# Patient Record
Sex: Female | Born: 1935 | Race: Black or African American | Hispanic: No | Smoking: Former smoker
Health system: Southern US, Community
[De-identification: ages and names within clinical notes are randomized; demographics above are authoritative.]

## PROBLEM LIST (undated history)

## (undated) DIAGNOSIS — M19111 Post-traumatic osteoarthritis, right shoulder: Secondary | ICD-10-CM

## (undated) DIAGNOSIS — I10 Essential (primary) hypertension: Secondary | ICD-10-CM

## (undated) DIAGNOSIS — M19112 Post-traumatic osteoarthritis, left shoulder: Secondary | ICD-10-CM

## (undated) DIAGNOSIS — E785 Hyperlipidemia, unspecified: Secondary | ICD-10-CM

## (undated) DIAGNOSIS — M792 Neuralgia and neuritis, unspecified: Secondary | ICD-10-CM

## (undated) DIAGNOSIS — K219 Gastro-esophageal reflux disease without esophagitis: Secondary | ICD-10-CM

## (undated) DIAGNOSIS — L8990 Pressure ulcer of unspecified site, unspecified stage: Secondary | ICD-10-CM

## (undated) DIAGNOSIS — Z8619 Personal history of other infectious and parasitic diseases: Secondary | ICD-10-CM

## (undated) DIAGNOSIS — M48061 Spinal stenosis, lumbar region without neurogenic claudication: Secondary | ICD-10-CM

## (undated) DIAGNOSIS — R51 Headache: Secondary | ICD-10-CM

## (undated) DIAGNOSIS — R519 Headache, unspecified: Secondary | ICD-10-CM

## (undated) DIAGNOSIS — Z96619 Presence of unspecified artificial shoulder joint: Secondary | ICD-10-CM

## (undated) DIAGNOSIS — M1990 Unspecified osteoarthritis, unspecified site: Secondary | ICD-10-CM

## (undated) DIAGNOSIS — S46002S Unspecified injury of muscle(s) and tendon(s) of the rotator cuff of left shoulder, sequela: Secondary | ICD-10-CM

## (undated) DIAGNOSIS — T7840XA Allergy, unspecified, initial encounter: Secondary | ICD-10-CM

## (undated) DIAGNOSIS — Z7409 Other reduced mobility: Secondary | ICD-10-CM

## (undated) DIAGNOSIS — H269 Unspecified cataract: Secondary | ICD-10-CM

## (undated) DIAGNOSIS — S46001S Unspecified injury of muscle(s) and tendon(s) of the rotator cuff of right shoulder, sequela: Secondary | ICD-10-CM

## (undated) HISTORY — DX: Spinal stenosis, lumbar region without neurogenic claudication: M48.061

## (undated) HISTORY — PX: TOTAL HIP ARTHROPLASTY: SHX124

## (undated) HISTORY — DX: Essential (primary) hypertension: I10

## (undated) HISTORY — PX: COLONOSCOPY: SHX174

## (undated) HISTORY — DX: Unspecified injury of muscle(s) and tendon(s) of the rotator cuff of left shoulder, sequela: S46.002S

## (undated) HISTORY — DX: Unspecified osteoarthritis, unspecified site: M19.90

## (undated) HISTORY — DX: Pressure ulcer of unspecified site, unspecified stage: L89.90

## (undated) HISTORY — DX: Post-traumatic osteoarthritis, right shoulder: M19.111

## (undated) HISTORY — DX: Gastro-esophageal reflux disease without esophagitis: K21.9

## (undated) HISTORY — DX: Other reduced mobility: Z74.09

## (undated) HISTORY — PX: SPINE SURGERY: SHX786

## (undated) HISTORY — DX: Hyperlipidemia, unspecified: E78.5

## (undated) HISTORY — DX: Neuralgia and neuritis, unspecified: M79.2

## (undated) HISTORY — DX: Presence of unspecified artificial shoulder joint: Z96.619

## (undated) HISTORY — PX: CERVICAL DISC SURGERY: SHX588

## (undated) HISTORY — DX: Allergy, unspecified, initial encounter: T78.40XA

## (undated) HISTORY — PX: BUNIONECTOMY: SHX129

## (undated) HISTORY — DX: Personal history of other infectious and parasitic diseases: Z86.19

## (undated) HISTORY — DX: Unspecified injury of muscle(s) and tendon(s) of the rotator cuff of right shoulder, sequela: S46.001S

## (undated) HISTORY — DX: Post-traumatic osteoarthritis, left shoulder: M19.112

## (undated) HISTORY — DX: Unspecified cataract: H26.9

## (undated) HISTORY — PX: CATARACT EXTRACTION, BILATERAL: SHX1313

## (undated) HISTORY — PX: KNEE ARTHROSCOPY: SUR90

---

## 2010-01-02 LAB — HM PAP SMEAR

## 2012-06-24 HISTORY — PX: ANKLE SURGERY: SHX546

## 2012-07-07 DIAGNOSIS — M549 Dorsalgia, unspecified: Secondary | ICD-10-CM | POA: Diagnosis not present

## 2012-07-07 DIAGNOSIS — M1990 Unspecified osteoarthritis, unspecified site: Secondary | ICD-10-CM | POA: Diagnosis not present

## 2012-07-07 DIAGNOSIS — E7800 Pure hypercholesterolemia, unspecified: Secondary | ICD-10-CM | POA: Diagnosis not present

## 2012-07-09 DIAGNOSIS — M1710 Unilateral primary osteoarthritis, unspecified knee: Secondary | ICD-10-CM | POA: Diagnosis not present

## 2012-07-09 DIAGNOSIS — M48061 Spinal stenosis, lumbar region without neurogenic claudication: Secondary | ICD-10-CM | POA: Diagnosis not present

## 2012-07-09 DIAGNOSIS — M5137 Other intervertebral disc degeneration, lumbosacral region: Secondary | ICD-10-CM | POA: Diagnosis not present

## 2012-07-13 DIAGNOSIS — M545 Low back pain: Secondary | ICD-10-CM | POA: Diagnosis not present

## 2012-07-13 DIAGNOSIS — M159 Polyosteoarthritis, unspecified: Secondary | ICD-10-CM | POA: Diagnosis not present

## 2012-07-13 DIAGNOSIS — M19049 Primary osteoarthritis, unspecified hand: Secondary | ICD-10-CM | POA: Diagnosis not present

## 2012-07-13 DIAGNOSIS — M25549 Pain in joints of unspecified hand: Secondary | ICD-10-CM | POA: Diagnosis not present

## 2012-07-16 DIAGNOSIS — M1710 Unilateral primary osteoarthritis, unspecified knee: Secondary | ICD-10-CM | POA: Diagnosis not present

## 2012-07-16 DIAGNOSIS — M48061 Spinal stenosis, lumbar region without neurogenic claudication: Secondary | ICD-10-CM | POA: Diagnosis not present

## 2012-07-16 DIAGNOSIS — M5137 Other intervertebral disc degeneration, lumbosacral region: Secondary | ICD-10-CM | POA: Diagnosis not present

## 2012-07-17 DIAGNOSIS — M25549 Pain in joints of unspecified hand: Secondary | ICD-10-CM | POA: Diagnosis not present

## 2012-07-17 DIAGNOSIS — M159 Polyosteoarthritis, unspecified: Secondary | ICD-10-CM | POA: Diagnosis not present

## 2012-07-17 DIAGNOSIS — M19049 Primary osteoarthritis, unspecified hand: Secondary | ICD-10-CM | POA: Diagnosis not present

## 2012-07-17 DIAGNOSIS — M545 Low back pain: Secondary | ICD-10-CM | POA: Diagnosis not present

## 2012-07-20 DIAGNOSIS — M159 Polyosteoarthritis, unspecified: Secondary | ICD-10-CM | POA: Diagnosis not present

## 2012-07-20 DIAGNOSIS — M545 Low back pain: Secondary | ICD-10-CM | POA: Diagnosis not present

## 2012-07-20 DIAGNOSIS — M25549 Pain in joints of unspecified hand: Secondary | ICD-10-CM | POA: Diagnosis not present

## 2012-07-20 DIAGNOSIS — M19049 Primary osteoarthritis, unspecified hand: Secondary | ICD-10-CM | POA: Diagnosis not present

## 2012-07-22 DIAGNOSIS — M19049 Primary osteoarthritis, unspecified hand: Secondary | ICD-10-CM | POA: Diagnosis not present

## 2012-07-22 DIAGNOSIS — M545 Low back pain: Secondary | ICD-10-CM | POA: Diagnosis not present

## 2012-07-22 DIAGNOSIS — M159 Polyosteoarthritis, unspecified: Secondary | ICD-10-CM | POA: Diagnosis not present

## 2012-07-22 DIAGNOSIS — M25549 Pain in joints of unspecified hand: Secondary | ICD-10-CM | POA: Diagnosis not present

## 2012-07-24 DIAGNOSIS — M5137 Other intervertebral disc degeneration, lumbosacral region: Secondary | ICD-10-CM | POA: Diagnosis not present

## 2012-07-24 DIAGNOSIS — M48061 Spinal stenosis, lumbar region without neurogenic claudication: Secondary | ICD-10-CM | POA: Diagnosis not present

## 2012-07-24 DIAGNOSIS — M1710 Unilateral primary osteoarthritis, unspecified knee: Secondary | ICD-10-CM | POA: Diagnosis not present

## 2012-07-31 DIAGNOSIS — M25549 Pain in joints of unspecified hand: Secondary | ICD-10-CM | POA: Diagnosis not present

## 2012-07-31 DIAGNOSIS — M159 Polyosteoarthritis, unspecified: Secondary | ICD-10-CM | POA: Diagnosis not present

## 2012-07-31 DIAGNOSIS — M19049 Primary osteoarthritis, unspecified hand: Secondary | ICD-10-CM | POA: Diagnosis not present

## 2012-07-31 DIAGNOSIS — M545 Low back pain: Secondary | ICD-10-CM | POA: Diagnosis not present

## 2012-08-03 DIAGNOSIS — M25549 Pain in joints of unspecified hand: Secondary | ICD-10-CM | POA: Diagnosis not present

## 2012-08-03 DIAGNOSIS — M159 Polyosteoarthritis, unspecified: Secondary | ICD-10-CM | POA: Diagnosis not present

## 2012-08-03 DIAGNOSIS — M545 Low back pain: Secondary | ICD-10-CM | POA: Diagnosis not present

## 2012-08-03 DIAGNOSIS — M19049 Primary osteoarthritis, unspecified hand: Secondary | ICD-10-CM | POA: Diagnosis not present

## 2012-08-05 DIAGNOSIS — M19049 Primary osteoarthritis, unspecified hand: Secondary | ICD-10-CM | POA: Diagnosis not present

## 2012-08-05 DIAGNOSIS — M545 Low back pain: Secondary | ICD-10-CM | POA: Diagnosis not present

## 2012-08-05 DIAGNOSIS — M25549 Pain in joints of unspecified hand: Secondary | ICD-10-CM | POA: Diagnosis not present

## 2012-08-05 DIAGNOSIS — M159 Polyosteoarthritis, unspecified: Secondary | ICD-10-CM | POA: Diagnosis not present

## 2012-08-10 DIAGNOSIS — M159 Polyosteoarthritis, unspecified: Secondary | ICD-10-CM | POA: Diagnosis not present

## 2012-08-10 DIAGNOSIS — M25549 Pain in joints of unspecified hand: Secondary | ICD-10-CM | POA: Diagnosis not present

## 2012-08-10 DIAGNOSIS — M19049 Primary osteoarthritis, unspecified hand: Secondary | ICD-10-CM | POA: Diagnosis not present

## 2012-08-10 DIAGNOSIS — M545 Low back pain: Secondary | ICD-10-CM | POA: Diagnosis not present

## 2012-08-20 DIAGNOSIS — Z1231 Encounter for screening mammogram for malignant neoplasm of breast: Secondary | ICD-10-CM | POA: Diagnosis not present

## 2012-10-13 DIAGNOSIS — E7800 Pure hypercholesterolemia, unspecified: Secondary | ICD-10-CM | POA: Diagnosis not present

## 2012-10-13 DIAGNOSIS — M1990 Unspecified osteoarthritis, unspecified site: Secondary | ICD-10-CM | POA: Diagnosis not present

## 2012-10-13 DIAGNOSIS — I10 Essential (primary) hypertension: Secondary | ICD-10-CM | POA: Diagnosis not present

## 2012-11-05 DIAGNOSIS — M48061 Spinal stenosis, lumbar region without neurogenic claudication: Secondary | ICD-10-CM | POA: Diagnosis not present

## 2012-11-05 DIAGNOSIS — M1710 Unilateral primary osteoarthritis, unspecified knee: Secondary | ICD-10-CM | POA: Diagnosis not present

## 2012-11-05 DIAGNOSIS — M5137 Other intervertebral disc degeneration, lumbosacral region: Secondary | ICD-10-CM | POA: Diagnosis not present

## 2012-11-06 DIAGNOSIS — M48061 Spinal stenosis, lumbar region without neurogenic claudication: Secondary | ICD-10-CM | POA: Diagnosis not present

## 2012-11-06 DIAGNOSIS — M5126 Other intervertebral disc displacement, lumbar region: Secondary | ICD-10-CM | POA: Diagnosis not present

## 2012-11-06 DIAGNOSIS — M539 Dorsopathy, unspecified: Secondary | ICD-10-CM | POA: Diagnosis not present

## 2012-11-06 DIAGNOSIS — M479 Spondylosis, unspecified: Secondary | ICD-10-CM | POA: Diagnosis not present

## 2012-11-10 DIAGNOSIS — M546 Pain in thoracic spine: Secondary | ICD-10-CM | POA: Diagnosis not present

## 2012-11-10 DIAGNOSIS — M4804 Spinal stenosis, thoracic region: Secondary | ICD-10-CM | POA: Diagnosis not present

## 2012-11-10 DIAGNOSIS — M4310 Spondylolisthesis, site unspecified: Secondary | ICD-10-CM | POA: Diagnosis not present

## 2012-11-12 DIAGNOSIS — M48061 Spinal stenosis, lumbar region without neurogenic claudication: Secondary | ICD-10-CM | POA: Diagnosis not present

## 2012-11-17 DIAGNOSIS — M48061 Spinal stenosis, lumbar region without neurogenic claudication: Secondary | ICD-10-CM | POA: Diagnosis not present

## 2012-11-25 DIAGNOSIS — M48061 Spinal stenosis, lumbar region without neurogenic claudication: Secondary | ICD-10-CM | POA: Diagnosis not present

## 2012-11-27 DIAGNOSIS — M48061 Spinal stenosis, lumbar region without neurogenic claudication: Secondary | ICD-10-CM | POA: Diagnosis not present

## 2012-11-30 DIAGNOSIS — M48061 Spinal stenosis, lumbar region without neurogenic claudication: Secondary | ICD-10-CM | POA: Diagnosis not present

## 2012-12-02 DIAGNOSIS — M48061 Spinal stenosis, lumbar region without neurogenic claudication: Secondary | ICD-10-CM | POA: Diagnosis not present

## 2012-12-04 DIAGNOSIS — M48061 Spinal stenosis, lumbar region without neurogenic claudication: Secondary | ICD-10-CM | POA: Diagnosis not present

## 2012-12-07 DIAGNOSIS — M48061 Spinal stenosis, lumbar region without neurogenic claudication: Secondary | ICD-10-CM | POA: Diagnosis not present

## 2012-12-09 DIAGNOSIS — M48061 Spinal stenosis, lumbar region without neurogenic claudication: Secondary | ICD-10-CM | POA: Diagnosis not present

## 2012-12-10 DIAGNOSIS — M48061 Spinal stenosis, lumbar region without neurogenic claudication: Secondary | ICD-10-CM | POA: Diagnosis not present

## 2012-12-11 DIAGNOSIS — M48061 Spinal stenosis, lumbar region without neurogenic claudication: Secondary | ICD-10-CM | POA: Diagnosis not present

## 2012-12-29 DIAGNOSIS — M48061 Spinal stenosis, lumbar region without neurogenic claudication: Secondary | ICD-10-CM | POA: Diagnosis not present

## 2013-01-02 LAB — HM COLONOSCOPY: HM COLON: NORMAL

## 2013-01-06 DIAGNOSIS — M48061 Spinal stenosis, lumbar region without neurogenic claudication: Secondary | ICD-10-CM | POA: Diagnosis not present

## 2013-01-11 DIAGNOSIS — M5126 Other intervertebral disc displacement, lumbar region: Secondary | ICD-10-CM | POA: Diagnosis not present

## 2013-01-11 DIAGNOSIS — M4800 Spinal stenosis, site unspecified: Secondary | ICD-10-CM | POA: Diagnosis not present

## 2013-01-11 DIAGNOSIS — M4120 Other idiopathic scoliosis, site unspecified: Secondary | ICD-10-CM | POA: Diagnosis not present

## 2013-01-11 DIAGNOSIS — M47817 Spondylosis without myelopathy or radiculopathy, lumbosacral region: Secondary | ICD-10-CM | POA: Diagnosis not present

## 2013-01-20 DIAGNOSIS — M48061 Spinal stenosis, lumbar region without neurogenic claudication: Secondary | ICD-10-CM | POA: Diagnosis not present

## 2013-02-10 DIAGNOSIS — M4120 Other idiopathic scoliosis, site unspecified: Secondary | ICD-10-CM | POA: Diagnosis not present

## 2013-02-10 DIAGNOSIS — M48062 Spinal stenosis, lumbar region with neurogenic claudication: Secondary | ICD-10-CM | POA: Diagnosis not present

## 2013-02-10 DIAGNOSIS — M4716 Other spondylosis with myelopathy, lumbar region: Secondary | ICD-10-CM | POA: Diagnosis not present

## 2013-03-19 DIAGNOSIS — M5430 Sciatica, unspecified side: Secondary | ICD-10-CM | POA: Diagnosis not present

## 2013-03-19 DIAGNOSIS — R269 Unspecified abnormalities of gait and mobility: Secondary | ICD-10-CM | POA: Diagnosis not present

## 2013-03-19 DIAGNOSIS — M6281 Muscle weakness (generalized): Secondary | ICD-10-CM | POA: Diagnosis not present

## 2013-03-19 DIAGNOSIS — IMO0002 Reserved for concepts with insufficient information to code with codable children: Secondary | ICD-10-CM | POA: Diagnosis not present

## 2013-03-23 DIAGNOSIS — M5126 Other intervertebral disc displacement, lumbar region: Secondary | ICD-10-CM | POA: Diagnosis not present

## 2013-03-23 DIAGNOSIS — Z23 Encounter for immunization: Secondary | ICD-10-CM | POA: Diagnosis not present

## 2013-03-23 DIAGNOSIS — M549 Dorsalgia, unspecified: Secondary | ICD-10-CM | POA: Diagnosis not present

## 2013-03-23 DIAGNOSIS — I10 Essential (primary) hypertension: Secondary | ICD-10-CM | POA: Diagnosis not present

## 2013-03-23 DIAGNOSIS — E7800 Pure hypercholesterolemia, unspecified: Secondary | ICD-10-CM | POA: Diagnosis not present

## 2013-03-29 DIAGNOSIS — IMO0002 Reserved for concepts with insufficient information to code with codable children: Secondary | ICD-10-CM | POA: Diagnosis not present

## 2013-03-31 DIAGNOSIS — IMO0002 Reserved for concepts with insufficient information to code with codable children: Secondary | ICD-10-CM | POA: Diagnosis not present

## 2013-04-02 DIAGNOSIS — IMO0002 Reserved for concepts with insufficient information to code with codable children: Secondary | ICD-10-CM | POA: Diagnosis not present

## 2013-04-05 DIAGNOSIS — B351 Tinea unguium: Secondary | ICD-10-CM | POA: Diagnosis not present

## 2013-04-05 DIAGNOSIS — M79609 Pain in unspecified limb: Secondary | ICD-10-CM | POA: Diagnosis not present

## 2013-04-05 DIAGNOSIS — S82843A Displaced bimalleolar fracture of unspecified lower leg, initial encounter for closed fracture: Secondary | ICD-10-CM | POA: Diagnosis not present

## 2013-04-05 DIAGNOSIS — Z9889 Other specified postprocedural states: Secondary | ICD-10-CM | POA: Diagnosis not present

## 2013-04-05 DIAGNOSIS — S82899A Other fracture of unspecified lower leg, initial encounter for closed fracture: Secondary | ICD-10-CM | POA: Diagnosis not present

## 2013-04-05 DIAGNOSIS — M19079 Primary osteoarthritis, unspecified ankle and foot: Secondary | ICD-10-CM | POA: Diagnosis not present

## 2013-04-05 DIAGNOSIS — IMO0002 Reserved for concepts with insufficient information to code with codable children: Secondary | ICD-10-CM | POA: Diagnosis not present

## 2013-04-05 DIAGNOSIS — S82209A Unspecified fracture of shaft of unspecified tibia, initial encounter for closed fracture: Secondary | ICD-10-CM | POA: Diagnosis not present

## 2013-04-05 DIAGNOSIS — S82853A Displaced trimalleolar fracture of unspecified lower leg, initial encounter for closed fracture: Secondary | ICD-10-CM | POA: Diagnosis not present

## 2013-04-05 DIAGNOSIS — Z87891 Personal history of nicotine dependence: Secondary | ICD-10-CM | POA: Diagnosis not present

## 2013-04-05 DIAGNOSIS — I10 Essential (primary) hypertension: Secondary | ICD-10-CM | POA: Diagnosis not present

## 2013-04-05 DIAGNOSIS — S8290XD Unspecified fracture of unspecified lower leg, subsequent encounter for closed fracture with routine healing: Secondary | ICD-10-CM | POA: Diagnosis not present

## 2013-04-05 DIAGNOSIS — G8929 Other chronic pain: Secondary | ICD-10-CM | POA: Diagnosis not present

## 2013-04-05 DIAGNOSIS — R262 Difficulty in walking, not elsewhere classified: Secondary | ICD-10-CM | POA: Diagnosis not present

## 2013-04-05 DIAGNOSIS — M48061 Spinal stenosis, lumbar region without neurogenic claudication: Secondary | ICD-10-CM | POA: Diagnosis not present

## 2013-04-05 DIAGNOSIS — E785 Hyperlipidemia, unspecified: Secondary | ICD-10-CM | POA: Diagnosis not present

## 2013-04-05 DIAGNOSIS — S82409A Unspecified fracture of shaft of unspecified fibula, initial encounter for closed fracture: Secondary | ICD-10-CM | POA: Diagnosis not present

## 2013-04-05 DIAGNOSIS — M545 Low back pain: Secondary | ICD-10-CM | POA: Diagnosis not present

## 2013-04-05 DIAGNOSIS — M25579 Pain in unspecified ankle and joints of unspecified foot: Secondary | ICD-10-CM | POA: Diagnosis not present

## 2013-04-05 DIAGNOSIS — R911 Solitary pulmonary nodule: Secondary | ICD-10-CM | POA: Diagnosis not present

## 2013-04-05 DIAGNOSIS — M1710 Unilateral primary osteoarthritis, unspecified knee: Secondary | ICD-10-CM | POA: Diagnosis not present

## 2013-04-08 DIAGNOSIS — S82853A Displaced trimalleolar fracture of unspecified lower leg, initial encounter for closed fracture: Secondary | ICD-10-CM | POA: Diagnosis not present

## 2013-04-08 DIAGNOSIS — M79609 Pain in unspecified limb: Secondary | ICD-10-CM | POA: Diagnosis not present

## 2013-04-08 DIAGNOSIS — B351 Tinea unguium: Secondary | ICD-10-CM | POA: Diagnosis not present

## 2013-04-08 DIAGNOSIS — M48061 Spinal stenosis, lumbar region without neurogenic claudication: Secondary | ICD-10-CM | POA: Diagnosis not present

## 2013-04-08 DIAGNOSIS — Z9889 Other specified postprocedural states: Secondary | ICD-10-CM | POA: Diagnosis not present

## 2013-04-08 DIAGNOSIS — S82843A Displaced bimalleolar fracture of unspecified lower leg, initial encounter for closed fracture: Secondary | ICD-10-CM | POA: Diagnosis not present

## 2013-04-08 DIAGNOSIS — S82899A Other fracture of unspecified lower leg, initial encounter for closed fracture: Secondary | ICD-10-CM | POA: Diagnosis not present

## 2013-04-08 DIAGNOSIS — E785 Hyperlipidemia, unspecified: Secondary | ICD-10-CM | POA: Diagnosis not present

## 2013-04-08 DIAGNOSIS — R262 Difficulty in walking, not elsewhere classified: Secondary | ICD-10-CM | POA: Diagnosis not present

## 2013-04-08 DIAGNOSIS — I10 Essential (primary) hypertension: Secondary | ICD-10-CM | POA: Diagnosis not present

## 2013-04-09 DIAGNOSIS — S82899A Other fracture of unspecified lower leg, initial encounter for closed fracture: Secondary | ICD-10-CM | POA: Diagnosis not present

## 2013-04-09 DIAGNOSIS — I10 Essential (primary) hypertension: Secondary | ICD-10-CM | POA: Diagnosis not present

## 2013-04-09 DIAGNOSIS — Z9889 Other specified postprocedural states: Secondary | ICD-10-CM | POA: Diagnosis not present

## 2013-04-10 DIAGNOSIS — E785 Hyperlipidemia, unspecified: Secondary | ICD-10-CM | POA: Diagnosis not present

## 2013-04-10 DIAGNOSIS — I10 Essential (primary) hypertension: Secondary | ICD-10-CM | POA: Diagnosis not present

## 2013-04-10 DIAGNOSIS — S82899A Other fracture of unspecified lower leg, initial encounter for closed fracture: Secondary | ICD-10-CM | POA: Diagnosis not present

## 2013-04-13 DIAGNOSIS — I10 Essential (primary) hypertension: Secondary | ICD-10-CM | POA: Diagnosis not present

## 2013-04-13 DIAGNOSIS — S82899A Other fracture of unspecified lower leg, initial encounter for closed fracture: Secondary | ICD-10-CM | POA: Diagnosis not present

## 2013-04-13 DIAGNOSIS — E785 Hyperlipidemia, unspecified: Secondary | ICD-10-CM | POA: Diagnosis not present

## 2013-04-13 DIAGNOSIS — Z9889 Other specified postprocedural states: Secondary | ICD-10-CM | POA: Diagnosis not present

## 2013-04-15 DIAGNOSIS — Z9889 Other specified postprocedural states: Secondary | ICD-10-CM | POA: Diagnosis not present

## 2013-04-15 DIAGNOSIS — E785 Hyperlipidemia, unspecified: Secondary | ICD-10-CM | POA: Diagnosis not present

## 2013-04-15 DIAGNOSIS — I10 Essential (primary) hypertension: Secondary | ICD-10-CM | POA: Diagnosis not present

## 2013-04-15 DIAGNOSIS — S82899A Other fracture of unspecified lower leg, initial encounter for closed fracture: Secondary | ICD-10-CM | POA: Diagnosis not present

## 2013-04-16 DIAGNOSIS — I10 Essential (primary) hypertension: Secondary | ICD-10-CM | POA: Diagnosis not present

## 2013-04-16 DIAGNOSIS — S82899A Other fracture of unspecified lower leg, initial encounter for closed fracture: Secondary | ICD-10-CM | POA: Diagnosis not present

## 2013-04-16 DIAGNOSIS — Z9889 Other specified postprocedural states: Secondary | ICD-10-CM | POA: Diagnosis not present

## 2013-04-17 DIAGNOSIS — S82899A Other fracture of unspecified lower leg, initial encounter for closed fracture: Secondary | ICD-10-CM | POA: Diagnosis not present

## 2013-04-17 DIAGNOSIS — I10 Essential (primary) hypertension: Secondary | ICD-10-CM | POA: Diagnosis not present

## 2013-04-17 DIAGNOSIS — E785 Hyperlipidemia, unspecified: Secondary | ICD-10-CM | POA: Diagnosis not present

## 2013-04-19 DIAGNOSIS — Z9889 Other specified postprocedural states: Secondary | ICD-10-CM | POA: Diagnosis not present

## 2013-04-19 DIAGNOSIS — S82899A Other fracture of unspecified lower leg, initial encounter for closed fracture: Secondary | ICD-10-CM | POA: Diagnosis not present

## 2013-04-19 DIAGNOSIS — I10 Essential (primary) hypertension: Secondary | ICD-10-CM | POA: Diagnosis not present

## 2013-04-20 DIAGNOSIS — E785 Hyperlipidemia, unspecified: Secondary | ICD-10-CM | POA: Diagnosis not present

## 2013-04-20 DIAGNOSIS — Z9889 Other specified postprocedural states: Secondary | ICD-10-CM | POA: Diagnosis not present

## 2013-04-20 DIAGNOSIS — S82899A Other fracture of unspecified lower leg, initial encounter for closed fracture: Secondary | ICD-10-CM | POA: Diagnosis not present

## 2013-04-20 DIAGNOSIS — S82843A Displaced bimalleolar fracture of unspecified lower leg, initial encounter for closed fracture: Secondary | ICD-10-CM | POA: Diagnosis not present

## 2013-04-20 DIAGNOSIS — I10 Essential (primary) hypertension: Secondary | ICD-10-CM | POA: Diagnosis not present

## 2013-04-22 DIAGNOSIS — S82899A Other fracture of unspecified lower leg, initial encounter for closed fracture: Secondary | ICD-10-CM | POA: Diagnosis not present

## 2013-04-22 DIAGNOSIS — I10 Essential (primary) hypertension: Secondary | ICD-10-CM | POA: Diagnosis not present

## 2013-04-22 DIAGNOSIS — E785 Hyperlipidemia, unspecified: Secondary | ICD-10-CM | POA: Diagnosis not present

## 2013-04-23 DIAGNOSIS — S82899A Other fracture of unspecified lower leg, initial encounter for closed fracture: Secondary | ICD-10-CM | POA: Diagnosis not present

## 2013-04-23 DIAGNOSIS — Z9889 Other specified postprocedural states: Secondary | ICD-10-CM | POA: Diagnosis not present

## 2013-04-23 DIAGNOSIS — I10 Essential (primary) hypertension: Secondary | ICD-10-CM | POA: Diagnosis not present

## 2013-04-24 DIAGNOSIS — E785 Hyperlipidemia, unspecified: Secondary | ICD-10-CM | POA: Diagnosis not present

## 2013-04-24 DIAGNOSIS — I10 Essential (primary) hypertension: Secondary | ICD-10-CM | POA: Diagnosis not present

## 2013-04-24 DIAGNOSIS — S82899A Other fracture of unspecified lower leg, initial encounter for closed fracture: Secondary | ICD-10-CM | POA: Diagnosis not present

## 2013-04-26 DIAGNOSIS — I10 Essential (primary) hypertension: Secondary | ICD-10-CM | POA: Diagnosis not present

## 2013-04-26 DIAGNOSIS — Z9889 Other specified postprocedural states: Secondary | ICD-10-CM | POA: Diagnosis not present

## 2013-04-26 DIAGNOSIS — S82899A Other fracture of unspecified lower leg, initial encounter for closed fracture: Secondary | ICD-10-CM | POA: Diagnosis not present

## 2013-04-27 DIAGNOSIS — E785 Hyperlipidemia, unspecified: Secondary | ICD-10-CM | POA: Diagnosis not present

## 2013-04-27 DIAGNOSIS — Z9889 Other specified postprocedural states: Secondary | ICD-10-CM | POA: Diagnosis not present

## 2013-04-27 DIAGNOSIS — S82899A Other fracture of unspecified lower leg, initial encounter for closed fracture: Secondary | ICD-10-CM | POA: Diagnosis not present

## 2013-04-27 DIAGNOSIS — I10 Essential (primary) hypertension: Secondary | ICD-10-CM | POA: Diagnosis not present

## 2013-04-29 DIAGNOSIS — I10 Essential (primary) hypertension: Secondary | ICD-10-CM | POA: Diagnosis not present

## 2013-04-29 DIAGNOSIS — E785 Hyperlipidemia, unspecified: Secondary | ICD-10-CM | POA: Diagnosis not present

## 2013-04-29 DIAGNOSIS — S82899A Other fracture of unspecified lower leg, initial encounter for closed fracture: Secondary | ICD-10-CM | POA: Diagnosis not present

## 2013-04-29 DIAGNOSIS — Z9889 Other specified postprocedural states: Secondary | ICD-10-CM | POA: Diagnosis not present

## 2013-05-02 DIAGNOSIS — I10 Essential (primary) hypertension: Secondary | ICD-10-CM | POA: Diagnosis not present

## 2013-05-02 DIAGNOSIS — E785 Hyperlipidemia, unspecified: Secondary | ICD-10-CM | POA: Diagnosis not present

## 2013-05-02 DIAGNOSIS — S82899A Other fracture of unspecified lower leg, initial encounter for closed fracture: Secondary | ICD-10-CM | POA: Diagnosis not present

## 2013-05-03 DIAGNOSIS — I10 Essential (primary) hypertension: Secondary | ICD-10-CM | POA: Diagnosis not present

## 2013-05-03 DIAGNOSIS — S82899A Other fracture of unspecified lower leg, initial encounter for closed fracture: Secondary | ICD-10-CM | POA: Diagnosis not present

## 2013-05-03 DIAGNOSIS — Z9889 Other specified postprocedural states: Secondary | ICD-10-CM | POA: Diagnosis not present

## 2013-05-04 DIAGNOSIS — Z9889 Other specified postprocedural states: Secondary | ICD-10-CM | POA: Diagnosis not present

## 2013-05-04 DIAGNOSIS — I10 Essential (primary) hypertension: Secondary | ICD-10-CM | POA: Diagnosis not present

## 2013-05-04 DIAGNOSIS — E785 Hyperlipidemia, unspecified: Secondary | ICD-10-CM | POA: Diagnosis not present

## 2013-05-04 DIAGNOSIS — S82899A Other fracture of unspecified lower leg, initial encounter for closed fracture: Secondary | ICD-10-CM | POA: Diagnosis not present

## 2013-05-06 DIAGNOSIS — E785 Hyperlipidemia, unspecified: Secondary | ICD-10-CM | POA: Diagnosis not present

## 2013-05-06 DIAGNOSIS — S82899A Other fracture of unspecified lower leg, initial encounter for closed fracture: Secondary | ICD-10-CM | POA: Diagnosis not present

## 2013-05-06 DIAGNOSIS — I10 Essential (primary) hypertension: Secondary | ICD-10-CM | POA: Diagnosis not present

## 2013-05-06 DIAGNOSIS — Z9889 Other specified postprocedural states: Secondary | ICD-10-CM | POA: Diagnosis not present

## 2013-05-07 DIAGNOSIS — S82899A Other fracture of unspecified lower leg, initial encounter for closed fracture: Secondary | ICD-10-CM | POA: Diagnosis not present

## 2013-05-07 DIAGNOSIS — Z9889 Other specified postprocedural states: Secondary | ICD-10-CM | POA: Diagnosis not present

## 2013-05-07 DIAGNOSIS — I10 Essential (primary) hypertension: Secondary | ICD-10-CM | POA: Diagnosis not present

## 2013-05-09 DIAGNOSIS — S82899A Other fracture of unspecified lower leg, initial encounter for closed fracture: Secondary | ICD-10-CM | POA: Diagnosis not present

## 2013-05-09 DIAGNOSIS — I10 Essential (primary) hypertension: Secondary | ICD-10-CM | POA: Diagnosis not present

## 2013-05-09 DIAGNOSIS — E785 Hyperlipidemia, unspecified: Secondary | ICD-10-CM | POA: Diagnosis not present

## 2013-05-10 DIAGNOSIS — S82899A Other fracture of unspecified lower leg, initial encounter for closed fracture: Secondary | ICD-10-CM | POA: Diagnosis not present

## 2013-05-10 DIAGNOSIS — Z9889 Other specified postprocedural states: Secondary | ICD-10-CM | POA: Diagnosis not present

## 2013-05-10 DIAGNOSIS — I10 Essential (primary) hypertension: Secondary | ICD-10-CM | POA: Diagnosis not present

## 2013-05-11 DIAGNOSIS — S82899A Other fracture of unspecified lower leg, initial encounter for closed fracture: Secondary | ICD-10-CM | POA: Diagnosis not present

## 2013-05-11 DIAGNOSIS — E785 Hyperlipidemia, unspecified: Secondary | ICD-10-CM | POA: Diagnosis not present

## 2013-05-11 DIAGNOSIS — I10 Essential (primary) hypertension: Secondary | ICD-10-CM | POA: Diagnosis not present

## 2013-05-13 DIAGNOSIS — E785 Hyperlipidemia, unspecified: Secondary | ICD-10-CM | POA: Diagnosis not present

## 2013-05-13 DIAGNOSIS — S82899A Other fracture of unspecified lower leg, initial encounter for closed fracture: Secondary | ICD-10-CM | POA: Diagnosis not present

## 2013-05-13 DIAGNOSIS — Z9889 Other specified postprocedural states: Secondary | ICD-10-CM | POA: Diagnosis not present

## 2013-05-13 DIAGNOSIS — I10 Essential (primary) hypertension: Secondary | ICD-10-CM | POA: Diagnosis not present

## 2013-05-15 DIAGNOSIS — E785 Hyperlipidemia, unspecified: Secondary | ICD-10-CM | POA: Diagnosis not present

## 2013-05-15 DIAGNOSIS — S82899A Other fracture of unspecified lower leg, initial encounter for closed fracture: Secondary | ICD-10-CM | POA: Diagnosis not present

## 2013-05-15 DIAGNOSIS — I10 Essential (primary) hypertension: Secondary | ICD-10-CM | POA: Diagnosis not present

## 2013-05-18 DIAGNOSIS — S82899A Other fracture of unspecified lower leg, initial encounter for closed fracture: Secondary | ICD-10-CM | POA: Diagnosis not present

## 2013-05-18 DIAGNOSIS — S82843A Displaced bimalleolar fracture of unspecified lower leg, initial encounter for closed fracture: Secondary | ICD-10-CM | POA: Diagnosis not present

## 2013-05-18 DIAGNOSIS — I10 Essential (primary) hypertension: Secondary | ICD-10-CM | POA: Diagnosis not present

## 2013-05-18 DIAGNOSIS — E785 Hyperlipidemia, unspecified: Secondary | ICD-10-CM | POA: Diagnosis not present

## 2013-05-21 DIAGNOSIS — Z9889 Other specified postprocedural states: Secondary | ICD-10-CM | POA: Diagnosis not present

## 2013-05-21 DIAGNOSIS — I10 Essential (primary) hypertension: Secondary | ICD-10-CM | POA: Diagnosis not present

## 2013-05-21 DIAGNOSIS — S82899A Other fracture of unspecified lower leg, initial encounter for closed fracture: Secondary | ICD-10-CM | POA: Diagnosis not present

## 2013-05-22 DIAGNOSIS — S82899A Other fracture of unspecified lower leg, initial encounter for closed fracture: Secondary | ICD-10-CM | POA: Diagnosis not present

## 2013-05-22 DIAGNOSIS — I10 Essential (primary) hypertension: Secondary | ICD-10-CM | POA: Diagnosis not present

## 2013-05-22 DIAGNOSIS — E785 Hyperlipidemia, unspecified: Secondary | ICD-10-CM | POA: Diagnosis not present

## 2013-05-24 DIAGNOSIS — S82899A Other fracture of unspecified lower leg, initial encounter for closed fracture: Secondary | ICD-10-CM | POA: Diagnosis not present

## 2013-05-24 DIAGNOSIS — Z9889 Other specified postprocedural states: Secondary | ICD-10-CM | POA: Diagnosis not present

## 2013-05-24 DIAGNOSIS — I10 Essential (primary) hypertension: Secondary | ICD-10-CM | POA: Diagnosis not present

## 2013-05-25 DIAGNOSIS — I10 Essential (primary) hypertension: Secondary | ICD-10-CM | POA: Diagnosis not present

## 2013-05-25 DIAGNOSIS — E785 Hyperlipidemia, unspecified: Secondary | ICD-10-CM | POA: Diagnosis not present

## 2013-05-25 DIAGNOSIS — Z9889 Other specified postprocedural states: Secondary | ICD-10-CM | POA: Diagnosis not present

## 2013-05-25 DIAGNOSIS — S82899A Other fracture of unspecified lower leg, initial encounter for closed fracture: Secondary | ICD-10-CM | POA: Diagnosis not present

## 2013-05-27 DIAGNOSIS — E785 Hyperlipidemia, unspecified: Secondary | ICD-10-CM | POA: Diagnosis not present

## 2013-05-27 DIAGNOSIS — I10 Essential (primary) hypertension: Secondary | ICD-10-CM | POA: Diagnosis not present

## 2013-05-27 DIAGNOSIS — S82899A Other fracture of unspecified lower leg, initial encounter for closed fracture: Secondary | ICD-10-CM | POA: Diagnosis not present

## 2013-05-28 DIAGNOSIS — S82899A Other fracture of unspecified lower leg, initial encounter for closed fracture: Secondary | ICD-10-CM | POA: Diagnosis not present

## 2013-05-28 DIAGNOSIS — Z9889 Other specified postprocedural states: Secondary | ICD-10-CM | POA: Diagnosis not present

## 2013-05-28 DIAGNOSIS — I10 Essential (primary) hypertension: Secondary | ICD-10-CM | POA: Diagnosis not present

## 2013-05-29 DIAGNOSIS — E785 Hyperlipidemia, unspecified: Secondary | ICD-10-CM | POA: Diagnosis not present

## 2013-05-29 DIAGNOSIS — S82899A Other fracture of unspecified lower leg, initial encounter for closed fracture: Secondary | ICD-10-CM | POA: Diagnosis not present

## 2013-05-29 DIAGNOSIS — I10 Essential (primary) hypertension: Secondary | ICD-10-CM | POA: Diagnosis not present

## 2013-05-31 DIAGNOSIS — Z7901 Long term (current) use of anticoagulants: Secondary | ICD-10-CM | POA: Diagnosis not present

## 2013-05-31 DIAGNOSIS — Z9181 History of falling: Secondary | ICD-10-CM | POA: Diagnosis not present

## 2013-05-31 DIAGNOSIS — M25569 Pain in unspecified knee: Secondary | ICD-10-CM | POA: Diagnosis not present

## 2013-05-31 DIAGNOSIS — Z96649 Presence of unspecified artificial hip joint: Secondary | ICD-10-CM | POA: Diagnosis not present

## 2013-05-31 DIAGNOSIS — M48061 Spinal stenosis, lumbar region without neurogenic claudication: Secondary | ICD-10-CM | POA: Diagnosis not present

## 2013-05-31 DIAGNOSIS — Z602 Problems related to living alone: Secondary | ICD-10-CM | POA: Diagnosis not present

## 2013-05-31 DIAGNOSIS — IMO0001 Reserved for inherently not codable concepts without codable children: Secondary | ICD-10-CM | POA: Diagnosis not present

## 2013-05-31 DIAGNOSIS — I10 Essential (primary) hypertension: Secondary | ICD-10-CM | POA: Diagnosis not present

## 2013-06-01 DIAGNOSIS — M25569 Pain in unspecified knee: Secondary | ICD-10-CM | POA: Diagnosis not present

## 2013-06-01 DIAGNOSIS — IMO0001 Reserved for inherently not codable concepts without codable children: Secondary | ICD-10-CM | POA: Diagnosis not present

## 2013-06-01 DIAGNOSIS — Z9181 History of falling: Secondary | ICD-10-CM | POA: Diagnosis not present

## 2013-06-01 DIAGNOSIS — M48061 Spinal stenosis, lumbar region without neurogenic claudication: Secondary | ICD-10-CM | POA: Diagnosis not present

## 2013-06-01 DIAGNOSIS — Z7901 Long term (current) use of anticoagulants: Secondary | ICD-10-CM | POA: Diagnosis not present

## 2013-06-01 DIAGNOSIS — I10 Essential (primary) hypertension: Secondary | ICD-10-CM | POA: Diagnosis not present

## 2013-06-02 DIAGNOSIS — M48061 Spinal stenosis, lumbar region without neurogenic claudication: Secondary | ICD-10-CM | POA: Diagnosis not present

## 2013-06-02 DIAGNOSIS — M25569 Pain in unspecified knee: Secondary | ICD-10-CM | POA: Diagnosis not present

## 2013-06-02 DIAGNOSIS — IMO0001 Reserved for inherently not codable concepts without codable children: Secondary | ICD-10-CM | POA: Diagnosis not present

## 2013-06-02 DIAGNOSIS — Z9181 History of falling: Secondary | ICD-10-CM | POA: Diagnosis not present

## 2013-06-02 DIAGNOSIS — I10 Essential (primary) hypertension: Secondary | ICD-10-CM | POA: Diagnosis not present

## 2013-06-02 DIAGNOSIS — Z7901 Long term (current) use of anticoagulants: Secondary | ICD-10-CM | POA: Diagnosis not present

## 2013-06-03 DIAGNOSIS — M25569 Pain in unspecified knee: Secondary | ICD-10-CM | POA: Diagnosis not present

## 2013-06-03 DIAGNOSIS — IMO0001 Reserved for inherently not codable concepts without codable children: Secondary | ICD-10-CM | POA: Diagnosis not present

## 2013-06-03 DIAGNOSIS — M48061 Spinal stenosis, lumbar region without neurogenic claudication: Secondary | ICD-10-CM | POA: Diagnosis not present

## 2013-06-03 DIAGNOSIS — Z7901 Long term (current) use of anticoagulants: Secondary | ICD-10-CM | POA: Diagnosis not present

## 2013-06-03 DIAGNOSIS — Z9181 History of falling: Secondary | ICD-10-CM | POA: Diagnosis not present

## 2013-06-03 DIAGNOSIS — I10 Essential (primary) hypertension: Secondary | ICD-10-CM | POA: Diagnosis not present

## 2013-06-04 DIAGNOSIS — M48061 Spinal stenosis, lumbar region without neurogenic claudication: Secondary | ICD-10-CM | POA: Diagnosis not present

## 2013-06-04 DIAGNOSIS — I10 Essential (primary) hypertension: Secondary | ICD-10-CM | POA: Diagnosis not present

## 2013-06-04 DIAGNOSIS — Z7901 Long term (current) use of anticoagulants: Secondary | ICD-10-CM | POA: Diagnosis not present

## 2013-06-04 DIAGNOSIS — IMO0001 Reserved for inherently not codable concepts without codable children: Secondary | ICD-10-CM | POA: Diagnosis not present

## 2013-06-04 DIAGNOSIS — Z9181 History of falling: Secondary | ICD-10-CM | POA: Diagnosis not present

## 2013-06-04 DIAGNOSIS — M25569 Pain in unspecified knee: Secondary | ICD-10-CM | POA: Diagnosis not present

## 2013-06-08 DIAGNOSIS — IMO0001 Reserved for inherently not codable concepts without codable children: Secondary | ICD-10-CM | POA: Diagnosis not present

## 2013-06-08 DIAGNOSIS — Z7901 Long term (current) use of anticoagulants: Secondary | ICD-10-CM | POA: Diagnosis not present

## 2013-06-08 DIAGNOSIS — Z9181 History of falling: Secondary | ICD-10-CM | POA: Diagnosis not present

## 2013-06-08 DIAGNOSIS — I10 Essential (primary) hypertension: Secondary | ICD-10-CM | POA: Diagnosis not present

## 2013-06-08 DIAGNOSIS — M25569 Pain in unspecified knee: Secondary | ICD-10-CM | POA: Diagnosis not present

## 2013-06-08 DIAGNOSIS — M48061 Spinal stenosis, lumbar region without neurogenic claudication: Secondary | ICD-10-CM | POA: Diagnosis not present

## 2013-06-09 DIAGNOSIS — M25569 Pain in unspecified knee: Secondary | ICD-10-CM | POA: Diagnosis not present

## 2013-06-09 DIAGNOSIS — IMO0001 Reserved for inherently not codable concepts without codable children: Secondary | ICD-10-CM | POA: Diagnosis not present

## 2013-06-09 DIAGNOSIS — I10 Essential (primary) hypertension: Secondary | ICD-10-CM | POA: Diagnosis not present

## 2013-06-09 DIAGNOSIS — Z7901 Long term (current) use of anticoagulants: Secondary | ICD-10-CM | POA: Diagnosis not present

## 2013-06-09 DIAGNOSIS — Z9181 History of falling: Secondary | ICD-10-CM | POA: Diagnosis not present

## 2013-06-09 DIAGNOSIS — M48061 Spinal stenosis, lumbar region without neurogenic claudication: Secondary | ICD-10-CM | POA: Diagnosis not present

## 2013-06-10 DIAGNOSIS — M48061 Spinal stenosis, lumbar region without neurogenic claudication: Secondary | ICD-10-CM | POA: Diagnosis not present

## 2013-06-10 DIAGNOSIS — Z7901 Long term (current) use of anticoagulants: Secondary | ICD-10-CM | POA: Diagnosis not present

## 2013-06-10 DIAGNOSIS — IMO0001 Reserved for inherently not codable concepts without codable children: Secondary | ICD-10-CM | POA: Diagnosis not present

## 2013-06-10 DIAGNOSIS — Z9181 History of falling: Secondary | ICD-10-CM | POA: Diagnosis not present

## 2013-06-10 DIAGNOSIS — M25569 Pain in unspecified knee: Secondary | ICD-10-CM | POA: Diagnosis not present

## 2013-06-10 DIAGNOSIS — I10 Essential (primary) hypertension: Secondary | ICD-10-CM | POA: Diagnosis not present

## 2013-06-11 DIAGNOSIS — Z9181 History of falling: Secondary | ICD-10-CM | POA: Diagnosis not present

## 2013-06-11 DIAGNOSIS — M25569 Pain in unspecified knee: Secondary | ICD-10-CM | POA: Diagnosis not present

## 2013-06-11 DIAGNOSIS — IMO0001 Reserved for inherently not codable concepts without codable children: Secondary | ICD-10-CM | POA: Diagnosis not present

## 2013-06-11 DIAGNOSIS — Z7901 Long term (current) use of anticoagulants: Secondary | ICD-10-CM | POA: Diagnosis not present

## 2013-06-11 DIAGNOSIS — I10 Essential (primary) hypertension: Secondary | ICD-10-CM | POA: Diagnosis not present

## 2013-06-11 DIAGNOSIS — M48061 Spinal stenosis, lumbar region without neurogenic claudication: Secondary | ICD-10-CM | POA: Diagnosis not present

## 2013-06-14 DIAGNOSIS — Z7901 Long term (current) use of anticoagulants: Secondary | ICD-10-CM | POA: Diagnosis not present

## 2013-06-14 DIAGNOSIS — I10 Essential (primary) hypertension: Secondary | ICD-10-CM | POA: Diagnosis not present

## 2013-06-14 DIAGNOSIS — M48061 Spinal stenosis, lumbar region without neurogenic claudication: Secondary | ICD-10-CM | POA: Diagnosis not present

## 2013-06-14 DIAGNOSIS — IMO0001 Reserved for inherently not codable concepts without codable children: Secondary | ICD-10-CM | POA: Diagnosis not present

## 2013-06-14 DIAGNOSIS — Z9181 History of falling: Secondary | ICD-10-CM | POA: Diagnosis not present

## 2013-06-14 DIAGNOSIS — M25569 Pain in unspecified knee: Secondary | ICD-10-CM | POA: Diagnosis not present

## 2013-06-15 DIAGNOSIS — M48061 Spinal stenosis, lumbar region without neurogenic claudication: Secondary | ICD-10-CM | POA: Diagnosis not present

## 2013-06-15 DIAGNOSIS — I10 Essential (primary) hypertension: Secondary | ICD-10-CM | POA: Diagnosis not present

## 2013-06-15 DIAGNOSIS — Z7901 Long term (current) use of anticoagulants: Secondary | ICD-10-CM | POA: Diagnosis not present

## 2013-06-15 DIAGNOSIS — M25569 Pain in unspecified knee: Secondary | ICD-10-CM | POA: Diagnosis not present

## 2013-06-15 DIAGNOSIS — Z9181 History of falling: Secondary | ICD-10-CM | POA: Diagnosis not present

## 2013-06-15 DIAGNOSIS — S82843A Displaced bimalleolar fracture of unspecified lower leg, initial encounter for closed fracture: Secondary | ICD-10-CM | POA: Diagnosis not present

## 2013-06-15 DIAGNOSIS — IMO0001 Reserved for inherently not codable concepts without codable children: Secondary | ICD-10-CM | POA: Diagnosis not present

## 2013-06-18 DIAGNOSIS — M48061 Spinal stenosis, lumbar region without neurogenic claudication: Secondary | ICD-10-CM | POA: Diagnosis not present

## 2013-06-18 DIAGNOSIS — Z9181 History of falling: Secondary | ICD-10-CM | POA: Diagnosis not present

## 2013-06-18 DIAGNOSIS — Z7901 Long term (current) use of anticoagulants: Secondary | ICD-10-CM | POA: Diagnosis not present

## 2013-06-18 DIAGNOSIS — M25569 Pain in unspecified knee: Secondary | ICD-10-CM | POA: Diagnosis not present

## 2013-06-18 DIAGNOSIS — I10 Essential (primary) hypertension: Secondary | ICD-10-CM | POA: Diagnosis not present

## 2013-06-18 DIAGNOSIS — IMO0001 Reserved for inherently not codable concepts without codable children: Secondary | ICD-10-CM | POA: Diagnosis not present

## 2013-06-21 DIAGNOSIS — M25569 Pain in unspecified knee: Secondary | ICD-10-CM | POA: Diagnosis not present

## 2013-06-21 DIAGNOSIS — Z7901 Long term (current) use of anticoagulants: Secondary | ICD-10-CM | POA: Diagnosis not present

## 2013-06-21 DIAGNOSIS — M48061 Spinal stenosis, lumbar region without neurogenic claudication: Secondary | ICD-10-CM | POA: Diagnosis not present

## 2013-06-21 DIAGNOSIS — I10 Essential (primary) hypertension: Secondary | ICD-10-CM | POA: Diagnosis not present

## 2013-06-21 DIAGNOSIS — IMO0001 Reserved for inherently not codable concepts without codable children: Secondary | ICD-10-CM | POA: Diagnosis not present

## 2013-06-21 DIAGNOSIS — Z9181 History of falling: Secondary | ICD-10-CM | POA: Diagnosis not present

## 2013-06-22 DIAGNOSIS — I10 Essential (primary) hypertension: Secondary | ICD-10-CM | POA: Diagnosis not present

## 2013-06-22 DIAGNOSIS — Z9181 History of falling: Secondary | ICD-10-CM | POA: Diagnosis not present

## 2013-06-22 DIAGNOSIS — M25569 Pain in unspecified knee: Secondary | ICD-10-CM | POA: Diagnosis not present

## 2013-06-22 DIAGNOSIS — IMO0001 Reserved for inherently not codable concepts without codable children: Secondary | ICD-10-CM | POA: Diagnosis not present

## 2013-06-22 DIAGNOSIS — M48061 Spinal stenosis, lumbar region without neurogenic claudication: Secondary | ICD-10-CM | POA: Diagnosis not present

## 2013-06-22 DIAGNOSIS — Z7901 Long term (current) use of anticoagulants: Secondary | ICD-10-CM | POA: Diagnosis not present

## 2013-06-23 DIAGNOSIS — Z9181 History of falling: Secondary | ICD-10-CM | POA: Diagnosis not present

## 2013-06-23 DIAGNOSIS — Z7901 Long term (current) use of anticoagulants: Secondary | ICD-10-CM | POA: Diagnosis not present

## 2013-06-23 DIAGNOSIS — M48061 Spinal stenosis, lumbar region without neurogenic claudication: Secondary | ICD-10-CM | POA: Diagnosis not present

## 2013-06-23 DIAGNOSIS — IMO0001 Reserved for inherently not codable concepts without codable children: Secondary | ICD-10-CM | POA: Diagnosis not present

## 2013-06-23 DIAGNOSIS — M25569 Pain in unspecified knee: Secondary | ICD-10-CM | POA: Diagnosis not present

## 2013-06-23 DIAGNOSIS — I10 Essential (primary) hypertension: Secondary | ICD-10-CM | POA: Diagnosis not present

## 2013-06-30 DIAGNOSIS — M6281 Muscle weakness (generalized): Secondary | ICD-10-CM | POA: Diagnosis not present

## 2013-06-30 DIAGNOSIS — S82899A Other fracture of unspecified lower leg, initial encounter for closed fracture: Secondary | ICD-10-CM | POA: Diagnosis not present

## 2013-07-02 DIAGNOSIS — M6281 Muscle weakness (generalized): Secondary | ICD-10-CM | POA: Diagnosis not present

## 2013-07-02 DIAGNOSIS — S82899A Other fracture of unspecified lower leg, initial encounter for closed fracture: Secondary | ICD-10-CM | POA: Diagnosis not present

## 2013-07-06 DIAGNOSIS — M6281 Muscle weakness (generalized): Secondary | ICD-10-CM | POA: Diagnosis not present

## 2013-07-06 DIAGNOSIS — S82899A Other fracture of unspecified lower leg, initial encounter for closed fracture: Secondary | ICD-10-CM | POA: Diagnosis not present

## 2013-07-07 DIAGNOSIS — S82899A Other fracture of unspecified lower leg, initial encounter for closed fracture: Secondary | ICD-10-CM | POA: Diagnosis not present

## 2013-07-07 DIAGNOSIS — M6281 Muscle weakness (generalized): Secondary | ICD-10-CM | POA: Diagnosis not present

## 2013-07-09 DIAGNOSIS — S82899A Other fracture of unspecified lower leg, initial encounter for closed fracture: Secondary | ICD-10-CM | POA: Diagnosis not present

## 2013-07-09 DIAGNOSIS — M6281 Muscle weakness (generalized): Secondary | ICD-10-CM | POA: Diagnosis not present

## 2013-07-13 DIAGNOSIS — I10 Essential (primary) hypertension: Secondary | ICD-10-CM | POA: Diagnosis not present

## 2013-07-13 DIAGNOSIS — M6281 Muscle weakness (generalized): Secondary | ICD-10-CM | POA: Diagnosis not present

## 2013-07-13 DIAGNOSIS — S82899A Other fracture of unspecified lower leg, initial encounter for closed fracture: Secondary | ICD-10-CM | POA: Diagnosis not present

## 2013-07-13 DIAGNOSIS — E7800 Pure hypercholesterolemia, unspecified: Secondary | ICD-10-CM | POA: Diagnosis not present

## 2013-07-16 DIAGNOSIS — S82899A Other fracture of unspecified lower leg, initial encounter for closed fracture: Secondary | ICD-10-CM | POA: Diagnosis not present

## 2013-07-16 DIAGNOSIS — M6281 Muscle weakness (generalized): Secondary | ICD-10-CM | POA: Diagnosis not present

## 2013-07-21 DIAGNOSIS — M6281 Muscle weakness (generalized): Secondary | ICD-10-CM | POA: Diagnosis not present

## 2013-07-21 DIAGNOSIS — S82899A Other fracture of unspecified lower leg, initial encounter for closed fracture: Secondary | ICD-10-CM | POA: Diagnosis not present

## 2013-07-23 DIAGNOSIS — S82899A Other fracture of unspecified lower leg, initial encounter for closed fracture: Secondary | ICD-10-CM | POA: Diagnosis not present

## 2013-07-23 DIAGNOSIS — M6281 Muscle weakness (generalized): Secondary | ICD-10-CM | POA: Diagnosis not present

## 2013-07-27 DIAGNOSIS — M6281 Muscle weakness (generalized): Secondary | ICD-10-CM | POA: Diagnosis not present

## 2013-07-27 DIAGNOSIS — S82899A Other fracture of unspecified lower leg, initial encounter for closed fracture: Secondary | ICD-10-CM | POA: Diagnosis not present

## 2013-07-28 DIAGNOSIS — M5430 Sciatica, unspecified side: Secondary | ICD-10-CM | POA: Diagnosis not present

## 2013-07-28 DIAGNOSIS — IMO0001 Reserved for inherently not codable concepts without codable children: Secondary | ICD-10-CM | POA: Diagnosis not present

## 2013-07-28 DIAGNOSIS — M545 Low back pain, unspecified: Secondary | ICD-10-CM | POA: Diagnosis not present

## 2013-07-28 DIAGNOSIS — S82899A Other fracture of unspecified lower leg, initial encounter for closed fracture: Secondary | ICD-10-CM | POA: Diagnosis not present

## 2013-07-28 DIAGNOSIS — IMO0002 Reserved for concepts with insufficient information to code with codable children: Secondary | ICD-10-CM | POA: Diagnosis not present

## 2013-07-28 DIAGNOSIS — M6281 Muscle weakness (generalized): Secondary | ICD-10-CM | POA: Diagnosis not present

## 2013-07-30 DIAGNOSIS — S82899A Other fracture of unspecified lower leg, initial encounter for closed fracture: Secondary | ICD-10-CM | POA: Diagnosis not present

## 2013-07-30 DIAGNOSIS — M6281 Muscle weakness (generalized): Secondary | ICD-10-CM | POA: Diagnosis not present

## 2013-08-03 DIAGNOSIS — M6281 Muscle weakness (generalized): Secondary | ICD-10-CM | POA: Diagnosis not present

## 2013-08-03 DIAGNOSIS — S82899A Other fracture of unspecified lower leg, initial encounter for closed fracture: Secondary | ICD-10-CM | POA: Diagnosis not present

## 2013-08-04 DIAGNOSIS — S82899A Other fracture of unspecified lower leg, initial encounter for closed fracture: Secondary | ICD-10-CM | POA: Diagnosis not present

## 2013-08-04 DIAGNOSIS — M6281 Muscle weakness (generalized): Secondary | ICD-10-CM | POA: Diagnosis not present

## 2013-08-06 DIAGNOSIS — S82899A Other fracture of unspecified lower leg, initial encounter for closed fracture: Secondary | ICD-10-CM | POA: Diagnosis not present

## 2013-08-06 DIAGNOSIS — M6281 Muscle weakness (generalized): Secondary | ICD-10-CM | POA: Diagnosis not present

## 2013-08-11 DIAGNOSIS — M6281 Muscle weakness (generalized): Secondary | ICD-10-CM | POA: Diagnosis not present

## 2013-08-11 DIAGNOSIS — S82899A Other fracture of unspecified lower leg, initial encounter for closed fracture: Secondary | ICD-10-CM | POA: Diagnosis not present

## 2013-08-13 DIAGNOSIS — M6281 Muscle weakness (generalized): Secondary | ICD-10-CM | POA: Diagnosis not present

## 2013-08-13 DIAGNOSIS — S82899A Other fracture of unspecified lower leg, initial encounter for closed fracture: Secondary | ICD-10-CM | POA: Diagnosis not present

## 2013-08-17 DIAGNOSIS — M6281 Muscle weakness (generalized): Secondary | ICD-10-CM | POA: Diagnosis not present

## 2013-08-17 DIAGNOSIS — S82899A Other fracture of unspecified lower leg, initial encounter for closed fracture: Secondary | ICD-10-CM | POA: Diagnosis not present

## 2013-08-20 DIAGNOSIS — M6281 Muscle weakness (generalized): Secondary | ICD-10-CM | POA: Diagnosis not present

## 2013-08-20 DIAGNOSIS — S82899A Other fracture of unspecified lower leg, initial encounter for closed fracture: Secondary | ICD-10-CM | POA: Diagnosis not present

## 2013-08-24 DIAGNOSIS — S82899A Other fracture of unspecified lower leg, initial encounter for closed fracture: Secondary | ICD-10-CM | POA: Diagnosis not present

## 2013-08-24 DIAGNOSIS — M6281 Muscle weakness (generalized): Secondary | ICD-10-CM | POA: Diagnosis not present

## 2013-08-31 DIAGNOSIS — M6281 Muscle weakness (generalized): Secondary | ICD-10-CM | POA: Diagnosis not present

## 2013-08-31 DIAGNOSIS — S82899A Other fracture of unspecified lower leg, initial encounter for closed fracture: Secondary | ICD-10-CM | POA: Diagnosis not present

## 2013-09-03 DIAGNOSIS — M6281 Muscle weakness (generalized): Secondary | ICD-10-CM | POA: Diagnosis not present

## 2013-09-03 DIAGNOSIS — S82899A Other fracture of unspecified lower leg, initial encounter for closed fracture: Secondary | ICD-10-CM | POA: Diagnosis not present

## 2013-09-07 DIAGNOSIS — M6281 Muscle weakness (generalized): Secondary | ICD-10-CM | POA: Diagnosis not present

## 2013-09-07 DIAGNOSIS — S82899A Other fracture of unspecified lower leg, initial encounter for closed fracture: Secondary | ICD-10-CM | POA: Diagnosis not present

## 2013-09-08 DIAGNOSIS — IMO0002 Reserved for concepts with insufficient information to code with codable children: Secondary | ICD-10-CM | POA: Diagnosis not present

## 2013-09-08 DIAGNOSIS — M545 Low back pain, unspecified: Secondary | ICD-10-CM | POA: Diagnosis not present

## 2013-09-08 DIAGNOSIS — M5430 Sciatica, unspecified side: Secondary | ICD-10-CM | POA: Diagnosis not present

## 2013-09-08 DIAGNOSIS — IMO0001 Reserved for inherently not codable concepts without codable children: Secondary | ICD-10-CM | POA: Diagnosis not present

## 2013-09-10 DIAGNOSIS — S82843A Displaced bimalleolar fracture of unspecified lower leg, initial encounter for closed fracture: Secondary | ICD-10-CM | POA: Diagnosis not present

## 2013-09-13 DIAGNOSIS — E7800 Pure hypercholesterolemia, unspecified: Secondary | ICD-10-CM | POA: Diagnosis not present

## 2013-09-13 DIAGNOSIS — I10 Essential (primary) hypertension: Secondary | ICD-10-CM | POA: Diagnosis not present

## 2013-09-13 DIAGNOSIS — E559 Vitamin D deficiency, unspecified: Secondary | ICD-10-CM | POA: Diagnosis not present

## 2013-09-14 DIAGNOSIS — S82899A Other fracture of unspecified lower leg, initial encounter for closed fracture: Secondary | ICD-10-CM | POA: Diagnosis not present

## 2013-09-14 DIAGNOSIS — M6281 Muscle weakness (generalized): Secondary | ICD-10-CM | POA: Diagnosis not present

## 2013-09-29 DIAGNOSIS — Z1231 Encounter for screening mammogram for malignant neoplasm of breast: Secondary | ICD-10-CM | POA: Diagnosis not present

## 2013-10-20 DIAGNOSIS — IMO0002 Reserved for concepts with insufficient information to code with codable children: Secondary | ICD-10-CM | POA: Diagnosis not present

## 2013-10-20 DIAGNOSIS — M545 Low back pain, unspecified: Secondary | ICD-10-CM | POA: Diagnosis not present

## 2013-11-01 DIAGNOSIS — IMO0002 Reserved for concepts with insufficient information to code with codable children: Secondary | ICD-10-CM | POA: Diagnosis not present

## 2013-11-01 DIAGNOSIS — M7989 Other specified soft tissue disorders: Secondary | ICD-10-CM | POA: Diagnosis not present

## 2013-11-01 DIAGNOSIS — M25569 Pain in unspecified knee: Secondary | ICD-10-CM | POA: Diagnosis not present

## 2013-11-01 DIAGNOSIS — M11869 Other specified crystal arthropathies, unspecified knee: Secondary | ICD-10-CM | POA: Diagnosis not present

## 2013-11-01 DIAGNOSIS — M1710 Unilateral primary osteoarthritis, unspecified knee: Secondary | ICD-10-CM | POA: Diagnosis not present

## 2013-12-01 DIAGNOSIS — IMO0002 Reserved for concepts with insufficient information to code with codable children: Secondary | ICD-10-CM | POA: Diagnosis not present

## 2013-12-01 DIAGNOSIS — IMO0001 Reserved for inherently not codable concepts without codable children: Secondary | ICD-10-CM | POA: Diagnosis not present

## 2013-12-01 DIAGNOSIS — M545 Low back pain, unspecified: Secondary | ICD-10-CM | POA: Diagnosis not present

## 2013-12-01 DIAGNOSIS — M25569 Pain in unspecified knee: Secondary | ICD-10-CM | POA: Diagnosis not present

## 2013-12-06 DIAGNOSIS — I10 Essential (primary) hypertension: Secondary | ICD-10-CM | POA: Diagnosis not present

## 2013-12-06 DIAGNOSIS — D649 Anemia, unspecified: Secondary | ICD-10-CM | POA: Diagnosis not present

## 2013-12-06 DIAGNOSIS — H6120 Impacted cerumen, unspecified ear: Secondary | ICD-10-CM | POA: Diagnosis not present

## 2013-12-06 DIAGNOSIS — H9190 Unspecified hearing loss, unspecified ear: Secondary | ICD-10-CM | POA: Diagnosis not present

## 2014-01-10 DIAGNOSIS — Z96649 Presence of unspecified artificial hip joint: Secondary | ICD-10-CM | POA: Diagnosis not present

## 2014-01-10 DIAGNOSIS — W010XXA Fall on same level from slipping, tripping and stumbling without subsequent striking against object, initial encounter: Secondary | ICD-10-CM | POA: Diagnosis not present

## 2014-01-10 DIAGNOSIS — I10 Essential (primary) hypertension: Secondary | ICD-10-CM | POA: Diagnosis not present

## 2014-01-10 DIAGNOSIS — M25559 Pain in unspecified hip: Secondary | ICD-10-CM | POA: Diagnosis not present

## 2014-01-10 DIAGNOSIS — R269 Unspecified abnormalities of gait and mobility: Secondary | ICD-10-CM | POA: Diagnosis not present

## 2014-01-10 DIAGNOSIS — R5383 Other fatigue: Secondary | ICD-10-CM | POA: Diagnosis not present

## 2014-01-10 DIAGNOSIS — R5381 Other malaise: Secondary | ICD-10-CM | POA: Diagnosis not present

## 2014-01-10 DIAGNOSIS — Z96659 Presence of unspecified artificial knee joint: Secondary | ICD-10-CM | POA: Diagnosis not present

## 2014-01-10 DIAGNOSIS — Z8739 Personal history of other diseases of the musculoskeletal system and connective tissue: Secondary | ICD-10-CM | POA: Diagnosis not present

## 2014-01-10 DIAGNOSIS — M5430 Sciatica, unspecified side: Secondary | ICD-10-CM | POA: Diagnosis not present

## 2014-01-10 DIAGNOSIS — M5137 Other intervertebral disc degeneration, lumbosacral region: Secondary | ICD-10-CM | POA: Diagnosis not present

## 2014-02-09 DIAGNOSIS — M4712 Other spondylosis with myelopathy, cervical region: Secondary | ICD-10-CM | POA: Diagnosis not present

## 2014-02-09 DIAGNOSIS — M4714 Other spondylosis with myelopathy, thoracic region: Secondary | ICD-10-CM | POA: Diagnosis not present

## 2014-02-09 DIAGNOSIS — M48062 Spinal stenosis, lumbar region with neurogenic claudication: Secondary | ICD-10-CM | POA: Diagnosis not present

## 2014-02-23 DIAGNOSIS — M47817 Spondylosis without myelopathy or radiculopathy, lumbosacral region: Secondary | ICD-10-CM | POA: Diagnosis not present

## 2014-02-23 DIAGNOSIS — M4120 Other idiopathic scoliosis, site unspecified: Secondary | ICD-10-CM | POA: Diagnosis not present

## 2014-02-23 DIAGNOSIS — M5124 Other intervertebral disc displacement, thoracic region: Secondary | ICD-10-CM | POA: Diagnosis not present

## 2014-02-23 DIAGNOSIS — M47814 Spondylosis without myelopathy or radiculopathy, thoracic region: Secondary | ICD-10-CM | POA: Diagnosis not present

## 2014-02-23 DIAGNOSIS — M5020 Other cervical disc displacement, unspecified cervical region: Secondary | ICD-10-CM | POA: Diagnosis not present

## 2014-02-23 DIAGNOSIS — M5126 Other intervertebral disc displacement, lumbar region: Secondary | ICD-10-CM | POA: Diagnosis not present

## 2014-02-23 DIAGNOSIS — Q762 Congenital spondylolisthesis: Secondary | ICD-10-CM | POA: Diagnosis not present

## 2014-04-16 DIAGNOSIS — L03119 Cellulitis of unspecified part of limb: Secondary | ICD-10-CM | POA: Diagnosis not present

## 2014-04-25 DIAGNOSIS — I10 Essential (primary) hypertension: Secondary | ICD-10-CM | POA: Diagnosis not present

## 2014-04-26 DIAGNOSIS — R2681 Unsteadiness on feet: Secondary | ICD-10-CM | POA: Diagnosis not present

## 2014-04-26 DIAGNOSIS — I10 Essential (primary) hypertension: Secondary | ICD-10-CM | POA: Diagnosis not present

## 2014-04-26 DIAGNOSIS — M4714 Other spondylosis with myelopathy, thoracic region: Secondary | ICD-10-CM | POA: Diagnosis present

## 2014-04-26 DIAGNOSIS — Z96643 Presence of artificial hip joint, bilateral: Secondary | ICD-10-CM | POA: Diagnosis present

## 2014-04-26 DIAGNOSIS — M4804 Spinal stenosis, thoracic region: Secondary | ICD-10-CM | POA: Diagnosis not present

## 2014-04-26 DIAGNOSIS — M4184 Other forms of scoliosis, thoracic region: Secondary | ICD-10-CM | POA: Diagnosis not present

## 2014-04-26 DIAGNOSIS — M4806 Spinal stenosis, lumbar region: Secondary | ICD-10-CM | POA: Diagnosis not present

## 2014-04-26 DIAGNOSIS — L97829 Non-pressure chronic ulcer of other part of left lower leg with unspecified severity: Secondary | ICD-10-CM | POA: Diagnosis not present

## 2014-04-26 DIAGNOSIS — E785 Hyperlipidemia, unspecified: Secondary | ICD-10-CM | POA: Diagnosis present

## 2014-04-26 DIAGNOSIS — M6281 Muscle weakness (generalized): Secondary | ICD-10-CM | POA: Diagnosis not present

## 2014-04-26 DIAGNOSIS — R278 Other lack of coordination: Secondary | ICD-10-CM | POA: Diagnosis not present

## 2014-04-26 DIAGNOSIS — Z981 Arthrodesis status: Secondary | ICD-10-CM | POA: Diagnosis not present

## 2014-04-26 DIAGNOSIS — M6250 Muscle wasting and atrophy, not elsewhere classified, unspecified site: Secondary | ICD-10-CM | POA: Diagnosis not present

## 2014-04-26 DIAGNOSIS — M1990 Unspecified osteoarthritis, unspecified site: Secondary | ICD-10-CM | POA: Diagnosis present

## 2014-04-26 DIAGNOSIS — Q762 Congenital spondylolisthesis: Secondary | ICD-10-CM | POA: Diagnosis not present

## 2014-04-26 DIAGNOSIS — M4150 Other secondary scoliosis, site unspecified: Secondary | ICD-10-CM | POA: Diagnosis not present

## 2014-04-26 DIAGNOSIS — M4314 Spondylolisthesis, thoracic region: Secondary | ICD-10-CM | POA: Diagnosis not present

## 2014-04-30 DIAGNOSIS — K5900 Constipation, unspecified: Secondary | ICD-10-CM | POA: Diagnosis not present

## 2014-04-30 DIAGNOSIS — R278 Other lack of coordination: Secondary | ICD-10-CM | POA: Diagnosis not present

## 2014-04-30 DIAGNOSIS — Z981 Arthrodesis status: Secondary | ICD-10-CM | POA: Diagnosis not present

## 2014-04-30 DIAGNOSIS — M4806 Spinal stenosis, lumbar region: Secondary | ICD-10-CM | POA: Diagnosis not present

## 2014-04-30 DIAGNOSIS — M419 Scoliosis, unspecified: Secondary | ICD-10-CM | POA: Diagnosis not present

## 2014-04-30 DIAGNOSIS — B351 Tinea unguium: Secondary | ICD-10-CM | POA: Diagnosis not present

## 2014-04-30 DIAGNOSIS — M79674 Pain in right toe(s): Secondary | ICD-10-CM | POA: Diagnosis not present

## 2014-04-30 DIAGNOSIS — M4804 Spinal stenosis, thoracic region: Secondary | ICD-10-CM | POA: Diagnosis not present

## 2014-04-30 DIAGNOSIS — S81802A Unspecified open wound, left lower leg, initial encounter: Secondary | ICD-10-CM | POA: Diagnosis not present

## 2014-04-30 DIAGNOSIS — M5104 Intervertebral disc disorders with myelopathy, thoracic region: Secondary | ICD-10-CM | POA: Diagnosis not present

## 2014-04-30 DIAGNOSIS — J309 Allergic rhinitis, unspecified: Secondary | ICD-10-CM | POA: Diagnosis not present

## 2014-04-30 DIAGNOSIS — L97829 Non-pressure chronic ulcer of other part of left lower leg with unspecified severity: Secondary | ICD-10-CM | POA: Diagnosis not present

## 2014-04-30 DIAGNOSIS — E785 Hyperlipidemia, unspecified: Secondary | ICD-10-CM | POA: Diagnosis not present

## 2014-04-30 DIAGNOSIS — M792 Neuralgia and neuritis, unspecified: Secondary | ICD-10-CM | POA: Diagnosis not present

## 2014-04-30 DIAGNOSIS — R2681 Unsteadiness on feet: Secondary | ICD-10-CM | POA: Diagnosis not present

## 2014-04-30 DIAGNOSIS — M4150 Other secondary scoliosis, site unspecified: Secondary | ICD-10-CM | POA: Diagnosis not present

## 2014-04-30 DIAGNOSIS — I10 Essential (primary) hypertension: Secondary | ICD-10-CM | POA: Diagnosis not present

## 2014-04-30 DIAGNOSIS — Z23 Encounter for immunization: Secondary | ICD-10-CM | POA: Diagnosis not present

## 2014-04-30 DIAGNOSIS — M6250 Muscle wasting and atrophy, not elsewhere classified, unspecified site: Secondary | ICD-10-CM | POA: Diagnosis not present

## 2014-04-30 DIAGNOSIS — M6281 Muscle weakness (generalized): Secondary | ICD-10-CM | POA: Diagnosis not present

## 2014-04-30 DIAGNOSIS — L97929 Non-pressure chronic ulcer of unspecified part of left lower leg with unspecified severity: Secondary | ICD-10-CM | POA: Diagnosis not present

## 2014-05-02 ENCOUNTER — Encounter: Payer: Self-pay | Admitting: *Deleted

## 2014-05-02 ENCOUNTER — Encounter: Payer: Self-pay | Admitting: Internal Medicine

## 2014-05-02 ENCOUNTER — Non-Acute Institutional Stay (SKILLED_NURSING_FACILITY): Payer: Medicare Other | Admitting: Internal Medicine

## 2014-05-02 DIAGNOSIS — M792 Neuralgia and neuritis, unspecified: Secondary | ICD-10-CM

## 2014-05-02 DIAGNOSIS — J309 Allergic rhinitis, unspecified: Secondary | ICD-10-CM | POA: Diagnosis not present

## 2014-05-02 DIAGNOSIS — I10 Essential (primary) hypertension: Secondary | ICD-10-CM

## 2014-05-02 DIAGNOSIS — M4806 Spinal stenosis, lumbar region: Secondary | ICD-10-CM

## 2014-05-02 DIAGNOSIS — L97909 Non-pressure chronic ulcer of unspecified part of unspecified lower leg with unspecified severity: Secondary | ICD-10-CM | POA: Insufficient documentation

## 2014-05-02 DIAGNOSIS — E785 Hyperlipidemia, unspecified: Secondary | ICD-10-CM | POA: Diagnosis not present

## 2014-05-02 DIAGNOSIS — L97929 Non-pressure chronic ulcer of unspecified part of left lower leg with unspecified severity: Secondary | ICD-10-CM

## 2014-05-02 DIAGNOSIS — M48061 Spinal stenosis, lumbar region without neurogenic claudication: Secondary | ICD-10-CM

## 2014-05-02 DIAGNOSIS — K5900 Constipation, unspecified: Secondary | ICD-10-CM

## 2014-05-02 HISTORY — DX: Hyperlipidemia, unspecified: E78.5

## 2014-05-02 HISTORY — DX: Spinal stenosis, lumbar region without neurogenic claudication: M48.061

## 2014-05-02 HISTORY — DX: Essential (primary) hypertension: I10

## 2014-05-02 HISTORY — DX: Neuralgia and neuritis, unspecified: M79.2

## 2014-05-02 NOTE — Progress Notes (Signed)
Patient ID: Katherine Owen, female   DOB: 1927/11/05, 80 y.o.   MRN: [id number]     [facility]   PCP: No primary care provider on file.  Code Status: FULL CODE  No Known Allergies  Chief Complaint  Patient presents with  . New Admit To SNF     HPI: 80 y/o female patient is here for STR after hospital admission from 04/26/14-04/30/14 with lumbar spinal stenosis. She underwent posterior lumbar facetectomy and foraminotomy with decompression. She was noted to have a pretibial ulcer and wound care consult was obtained. She was started on bactrim She has PMH of HTN, gait instability, HLD She is seen in her room today. Her pain is under control with current regimen. She has not been asking for muscle relaxants and says she did not know that she was on it. She would like some to help with her back spasm. Has generalized weakness and is working with therapy team  Review of Systems:  Constitutional: Negative for fever, chills, malaise/fatigue and diaphoresis.  HENT: Negative for congestion Respiratory: Negative for cough, sputum production, shortness of breath and wheezing.   Cardiovascular: Negative for chest pain, palpitations, leg swelling.  Gastrointestinal: Negative for heartburn, nausea, vomiting, abdominal pain Genitourinary: Negative for dysuria.  Musculoskeletal: Negative for falls Skin: Negative for itching, rash.  Neurological: Negative for dizziness, tingling, focal weakness and headaches.  Psychiatric/Behavioral: Negative for depression, insomnia and memory loss. The patient is not nervous/anxious.     Past Medical History  Diagnosis Date  . Hyperlipidemia   . Hypertension    Past Surgical History  Procedure Laterality Date  . Joint replacement  2003, 2009    Hips  . Knee arthroscopy Right 2005  . Ankle surgery Left 2014  . Colonoscopy    . Eye surgery      cataract   Social History:   reports that she has quit smoking. She has  never used smokeless tobacco. She reports that she does not drink alcohol or use illicit drugs.  Family History  Problem Relation Age of Onset  . Cancer Mother     breast  . Arthritis Mother     osteoarthritis  . Arthritis Father     osteoarthritis  . Anesthesia problems Neg Hx     Medications: Patient's Medications  New Prescriptions   No medications on file  Previous Medications   ACETAMINOPHEN (TYLENOL) 325 MG TABLET    Take 975 mg by mouth every 8 (eight) hours as needed for mild pain or moderate pain.   ATORVASTATIN (LIPITOR) 10 MG TABLET    Take 10 mg by mouth at bedtime. For high cholesterol   BISACODYL (DULCOLAX) 10 MG SUPPOSITORY    Place 10 mg rectally daily. For constipation   CETIRIZINE (ZYRTEC ALLERGY) 10 MG TABLET    Take 10 mg by mouth at bedtime.   ENOXAPARIN (LOVENOX) 40 MG/0.4ML INJECTION    Inject 40 mg into the skin daily. For DVT   GABAPENTIN (NEURONTIN) 100 MG CAPSULE    Take 200 mg by mouth 3 (three) times daily.   LOSARTAN-HYDROCHLOROTHIAZIDE (HYZAAR) 100-12.5 MG PER TABLET    Take 1 tablet by mouth at bedtime. For hypertension   METHOCARBAMOL (ROBAXIN) 750 MG TABLET    Take 750 mg by mouth every 8 (eight) hours as needed for muscle spasms.   MULTIPLE VITAMINS-MINERALS (CENTRUM SILVER) TABLET    Take 1 tablet by mouth daily.   ONDANSETRON IN DEXTROSE SOLUTION    Inject  4 mg into the vein every 6 (six) hours as needed.   POLYETHYLENE GLYCOL (MIRALAX / GLYCOLAX) PACKET    Take 17 g by mouth daily. For constipation   PROMETHAZINE (PHENERGAN) 50 MG/ML INJECTION    Inject 50 mg into the muscle every 4 (four) hours as needed for nausea or vomiting.   SENNA-DOCUSATE (SENOKOT-S) 8.6-50 MG PER TABLET    Take 2 tablets by mouth 2 (two) times daily. For constipation   SIMETHICONE (MYLICON) 80 MG CHEWABLE TABLET    Chew 80 mg by mouth 4 (four) times daily as needed for flatulence.   SULFAMETHOXAZOLE-TRIMETHOPRIM (BACTRIM DS,SEPTRA DS) 800-160 MG PER TABLET    Take 1 tablet  by mouth 2 (two) times daily.  Modified Medications   No medications on file  Discontinued Medications   No medications on file     Physical Exam: Filed Vitals:   05/02/14 1553  BP: 133/71  Pulse: 95  Temp: 99.1 F (37.3 C)  Resp: 18  SpO2: 100%    General- elderly female in no acute distress Head- atraumatic, normocephalic Eyes- PERRLA, EOMI, no pallor, no icterus, no discharge Neck- no cervical lymphadenopathy Throat- moist mucus membrane Cardiovascular- normal s1,s2, no murmurs/ rubs/ gallops, normal dorsalis pedis Respiratory- bilateral clear to auscultation, no wheeze, no rhonchi, no crackles, no use of accessory muscles Abdomen- bowel sounds present, soft, non tender Musculoskeletal- able to move all 4 extremities, no leg edema, arthritis changes of her digits noted Neurological- no focal deficit Skin- warm and dry, incision at back with 17 sutures, appears clean and dry, has ulcer in left tibia 1 x 1 cm with dressing in place, dressing is dry Psychiatry- alert and oriented to person, place and time, normal mood and affect   Labs reviewed: Please review hospital records for labs 04/29/14 na 134, k 4.1, bun 13, cr 0.7 04/27/14 hb 9.1  Assessment/Plan  Lumbar spinal stenosis S/p decompression and laminectomy. Continue lovenox for dvt prophylaxis, robaxin prn for muscle spasm and oxycodone 2.5-7.5 mg q4h prn for pain. Will have her work with physical therapy and occupational therapy team to help with gait training and muscle strengthening exercises.fall precautions. Skin care. Encourage to be out of bed. Sutures to be removed on 05/10/14. To follow with dr Radford Pax in 6 weeks  Constipation Continue dulcolax suppository daily, miralax daily and senna s  HLD Continue lipitor 10 mg daily  Allergic rhinitis Continue cetirizine  Neuropathic pain Continue gabapentin 200 mg tid  HTN bp stable, continue losartan-hctz 50-12.5 daily  Tibial ulcer Continue wound care  with metahoney colloid dressing, continue bactrim for total of 2 weeks, continue wound care and monitor for early signs of infection   Family/ staff Communication: reviewed care plan with patient and nursing supervisor   Goals of care: short term rehabilitation    Labs/tests ordered- cbc, cmp in 1 week    Blanchie Serve, MD  [facility] [phone] (Monday-Friday 8 am - 5 pm) [phone] (afterhours)

## 2014-06-08 DIAGNOSIS — Z981 Arthrodesis status: Secondary | ICD-10-CM | POA: Diagnosis not present

## 2014-06-08 DIAGNOSIS — M419 Scoliosis, unspecified: Secondary | ICD-10-CM | POA: Diagnosis not present

## 2014-06-14 ENCOUNTER — Encounter: Payer: Self-pay | Admitting: *Deleted

## 2014-06-22 ENCOUNTER — Encounter: Payer: Self-pay | Admitting: General Practice

## 2014-06-24 DIAGNOSIS — M6281 Muscle weakness (generalized): Secondary | ICD-10-CM | POA: Diagnosis not present

## 2014-06-24 DIAGNOSIS — E785 Hyperlipidemia, unspecified: Secondary | ICD-10-CM | POA: Diagnosis not present

## 2014-06-24 DIAGNOSIS — M4806 Spinal stenosis, lumbar region: Secondary | ICD-10-CM | POA: Diagnosis not present

## 2014-06-24 DIAGNOSIS — J309 Allergic rhinitis, unspecified: Secondary | ICD-10-CM | POA: Diagnosis not present

## 2014-06-24 DIAGNOSIS — Z23 Encounter for immunization: Secondary | ICD-10-CM | POA: Diagnosis not present

## 2014-06-24 DIAGNOSIS — M792 Neuralgia and neuritis, unspecified: Secondary | ICD-10-CM | POA: Diagnosis not present

## 2014-06-24 DIAGNOSIS — S81802A Unspecified open wound, left lower leg, initial encounter: Secondary | ICD-10-CM | POA: Diagnosis not present

## 2014-06-24 DIAGNOSIS — I10 Essential (primary) hypertension: Secondary | ICD-10-CM | POA: Diagnosis not present

## 2014-06-24 DIAGNOSIS — M4804 Spinal stenosis, thoracic region: Secondary | ICD-10-CM | POA: Diagnosis not present

## 2014-06-24 DIAGNOSIS — R278 Other lack of coordination: Secondary | ICD-10-CM | POA: Diagnosis not present

## 2014-06-24 DIAGNOSIS — K5900 Constipation, unspecified: Secondary | ICD-10-CM | POA: Diagnosis not present

## 2014-06-24 DIAGNOSIS — L97829 Non-pressure chronic ulcer of other part of left lower leg with unspecified severity: Secondary | ICD-10-CM | POA: Diagnosis not present

## 2014-06-24 DIAGNOSIS — R2681 Unsteadiness on feet: Secondary | ICD-10-CM | POA: Diagnosis not present

## 2014-06-24 DIAGNOSIS — M4150 Other secondary scoliosis, site unspecified: Secondary | ICD-10-CM | POA: Diagnosis not present

## 2014-06-24 DIAGNOSIS — L97929 Non-pressure chronic ulcer of unspecified part of left lower leg with unspecified severity: Secondary | ICD-10-CM | POA: Diagnosis not present

## 2014-06-24 DIAGNOSIS — Z981 Arthrodesis status: Secondary | ICD-10-CM | POA: Diagnosis not present

## 2014-06-28 ENCOUNTER — Other Ambulatory Visit: Payer: Self-pay | Admitting: *Deleted

## 2014-06-28 MED ORDER — OXYCODONE HCL 5 MG PO TABS: ORAL_TABLET | ORAL | Status: DC

## 2014-06-28 NOTE — Telephone Encounter (Signed)
[facility] 

## 2014-06-29 ENCOUNTER — Ambulatory Visit (INDEPENDENT_AMBULATORY_CARE_PROVIDER_SITE_OTHER): Payer: Medicare Other | Admitting: Family Medicine

## 2014-06-29 ENCOUNTER — Encounter: Payer: Self-pay | Admitting: General Practice

## 2014-06-29 ENCOUNTER — Encounter: Payer: Self-pay | Admitting: Family Medicine

## 2014-06-29 VITALS — BP 122/78 | Temp 98.0°F | Resp 17 | Ht 64.5 in | Wt 168.0 lb

## 2014-06-29 DIAGNOSIS — S81802A Unspecified open wound, left lower leg, initial encounter: Secondary | ICD-10-CM

## 2014-06-29 DIAGNOSIS — E785 Hyperlipidemia, unspecified: Secondary | ICD-10-CM | POA: Diagnosis not present

## 2014-06-29 DIAGNOSIS — I10 Essential (primary) hypertension: Secondary | ICD-10-CM | POA: Diagnosis not present

## 2014-06-29 DIAGNOSIS — M4806 Spinal stenosis, lumbar region: Secondary | ICD-10-CM

## 2014-06-29 DIAGNOSIS — M48061 Spinal stenosis, lumbar region without neurogenic claudication: Secondary | ICD-10-CM

## 2014-06-29 DIAGNOSIS — Z23 Encounter for immunization: Secondary | ICD-10-CM | POA: Diagnosis not present

## 2014-06-29 LAB — CBC WITH DIFFERENTIAL/PLATELET
Basophils Absolute: 0 10*3/uL (ref 0.0–0.1)
Basophils Relative: 0.5 % (ref 0.0–3.0)
EOS PCT: 1.4 % (ref 0.0–5.0)
Eosinophils Absolute: 0.1 10*3/uL (ref 0.0–0.7)
HEMATOCRIT: 35.2 % — AB (ref 36.0–46.0)
HEMOGLOBIN: 11.2 g/dL — AB (ref 12.0–15.0)
LYMPHS PCT: 21.5 % (ref 12.0–46.0)
Lymphs Abs: 1.6 10*3/uL (ref 0.7–4.0)
MCHC: 31.8 g/dL (ref 30.0–36.0)
MCV: 81.4 fl (ref 78.0–100.0)
MONO ABS: 0.5 10*3/uL (ref 0.1–1.0)
Monocytes Relative: 7.3 % (ref 3.0–12.0)
Neutro Abs: 5.1 10*3/uL (ref 1.4–7.7)
Neutrophils Relative %: 69.3 % (ref 43.0–77.0)
PLATELETS: 270 10*3/uL (ref 150.0–400.0)
RBC: 4.33 Mil/uL (ref 3.87–5.11)
RDW: 15 % (ref 11.5–15.5)
WBC: 7.4 10*3/uL (ref 4.0–10.5)

## 2014-06-29 LAB — LIPID PANEL
CHOLESTEROL: 171 mg/dL (ref 0–200)
HDL: 49.3 mg/dL (ref >39.00)
LDL Cholesterol: 94 mg/dL (ref 0–99)
NonHDL: 121.7
Total CHOL/HDL Ratio: 3
Triglycerides: 140 mg/dL (ref 0.0–149.0)
VLDL: 28 mg/dL (ref 0.0–40.0)

## 2014-06-29 LAB — HEPATIC FUNCTION PANEL
ALT: 21 U/L (ref 0–35)
AST: 28 U/L (ref 0–37)
Albumin: 4 g/dL (ref 3.5–5.2)
Alkaline Phosphatase: 85 U/L (ref 39–117)
BILIRUBIN DIRECT: 0 mg/dL (ref 0.0–0.3)
BILIRUBIN TOTAL: 0.5 mg/dL (ref 0.2–1.2)
TOTAL PROTEIN: 7.1 g/dL (ref 6.0–8.3)

## 2014-06-29 LAB — BASIC METABOLIC PANEL
BUN: 17 mg/dL (ref 6–23)
CO2: 30 mEq/L (ref 19–32)
CREATININE: 0.6 mg/dL (ref 0.4–1.2)
Calcium: 9.8 mg/dL (ref 8.4–10.5)
Chloride: 102 mEq/L (ref 96–112)
GFR: 115.61 mL/min (ref >60.00)
Glucose, Bld: 89 mg/dL (ref 70–99)
Potassium: 3.9 mEq/L (ref 3.5–5.1)
SODIUM: 137 meq/L (ref 135–145)

## 2014-06-29 NOTE — Progress Notes (Signed)
   Subjective:    Patient ID: Katherine Owen, female    DOB: [birth year], 81 y.o.   MRN: [id number]  HPI New to establish.  Lives in [place] but came to area to have surgery at [facility]- lumbar spinal decompression.  Now living at [facility] for rehab- leaving next Thursday to move in w/ son and wife.  HTN- chronic problem,on Losartan HCTZ.  Well controlled.  No CP, SOB, HAs, visual changes, edema.  Hyperlipidemia- chronic problem, on Lipitor.  Denies abd pain, N/V, myalgias.  L lower leg wound- on medial anterior tibia.  No obvious drainage.  Was tx'd w/ Bactrim at rehab facility but this is completed.  No pain.    Review of Systems For ROS see HPI     Objective:   Physical Exam  Constitutional: She is oriented to person, place, and time. She appears well-developed and well-nourished. No distress.  Sitting in wheelchair  HENT:  Head: Normocephalic and atraumatic.  Eyes: Conjunctivae and EOM are normal. Pupils are equal, round, and reactive to light.  Neck: Normal range of motion. Neck supple. No thyromegaly present.  Cardiovascular: Normal rate, regular rhythm, normal heart sounds and intact distal pulses.   No murmur heard. Pulmonary/Chest: Effort normal and breath sounds normal. No respiratory distress.  Abdominal: Soft. She exhibits no distension. There is no tenderness.  Musculoskeletal: She exhibits no edema.  Lymphadenopathy:    She has no cervical adenopathy.  Neurological: She is alert and oriented to person, place, and time.  Skin: Skin is warm and dry. No erythema (surrounding 0.5cm well healing L lower extremity ulcer w/o drainage or oozing or signs of infxn).  Psychiatric: She has a normal mood and affect. Her behavior is normal.  Vitals reviewed.         Assessment & Plan:

## 2014-06-29 NOTE — Patient Instructions (Signed)
Schedule your complete physical in 6 months (if still in the area) We'll notify you of your lab results and make any changes if needed Keep up the good work!  You look great! Call with any questions or concerns Welcome!  We're glad to have you! Happy New Year!!!

## 2014-06-29 NOTE — Progress Notes (Signed)
Pre visit review using our clinic review tool, if applicable. No additional management support is needed unless otherwise documented below in the visit note. 

## 2014-07-03 NOTE — Assessment & Plan Note (Signed)
New to provider, ongoing for pt.  Well controlled.  Asymptomatic.  Check labs.  No anticipated med changes. 

## 2014-07-03 NOTE — Assessment & Plan Note (Signed)
New to provider, ongoing for pt.  Tolerating statin w/o difficulty.  Check labs.  Adjust meds prn  

## 2014-07-03 NOTE — Assessment & Plan Note (Signed)
New to provider, chronic for pt.  S/p decompression at [facility] in November.  Is healing remarkably well and is pending d/c from [facility] next week.  Will continue to follow.

## 2014-07-03 NOTE — Assessment & Plan Note (Signed)
New to provider.  Nearly completely healed at this point.  No evidence of oozing, drainage, infection.

## 2014-07-04 ENCOUNTER — Encounter: Payer: Self-pay | Admitting: Adult Health

## 2014-07-04 ENCOUNTER — Non-Acute Institutional Stay (SKILLED_NURSING_FACILITY): Payer: Medicare Other | Admitting: Adult Health

## 2014-07-04 DIAGNOSIS — I10 Essential (primary) hypertension: Secondary | ICD-10-CM | POA: Diagnosis not present

## 2014-07-04 DIAGNOSIS — E785 Hyperlipidemia, unspecified: Secondary | ICD-10-CM

## 2014-07-04 DIAGNOSIS — K5900 Constipation, unspecified: Secondary | ICD-10-CM

## 2014-07-04 DIAGNOSIS — J309 Allergic rhinitis, unspecified: Secondary | ICD-10-CM | POA: Diagnosis not present

## 2014-07-04 DIAGNOSIS — M792 Neuralgia and neuritis, unspecified: Secondary | ICD-10-CM

## 2014-07-04 DIAGNOSIS — M48061 Spinal stenosis, lumbar region without neurogenic claudication: Secondary | ICD-10-CM

## 2014-07-04 DIAGNOSIS — M4806 Spinal stenosis, lumbar region: Secondary | ICD-10-CM

## 2014-07-04 DIAGNOSIS — L97929 Non-pressure chronic ulcer of unspecified part of left lower leg with unspecified severity: Secondary | ICD-10-CM

## 2014-07-04 NOTE — Progress Notes (Signed)
Patient ID: Katherine Owen, female   DOB: 1927/12/17, 81 y.o.   MRN: [id number]   07/04/2014  Facility:  Nursing Home Location:  [place] Room Number: 409-W LEVEL OF CARE:  SNF (31)  Routine Visit  Chief Complaint  Patient presents with  . Discharge Note    Lumbar spinal stenosis S/P decompression and laminectomy, constipation, hyperlipidemia, allergic rhinitis, neuropathy, hypertension and tibial ulcer    HISTORY OF PRESENT ILLNESS:  This is an 81 year old female was for discharge home with home health PT, OT, nursing and home health aide. She has been admitted to [facility] on 04/30/14 from [facility] with lumbar spinal stenosis S/P decompression and laminectomy. Patient was admitted to this facility for short-term rehabilitation after the patient's recent hospitalization.  She has past medical history of hyperlipidemia, allergic rhinitis, hypertension and neuropathy. Patient has completed SNF rehabilitation and therapy has cleared the patient for discharge.  PAST MEDICAL HISTORY:  Past Medical History  Diagnosis Date  . Hyperlipidemia   . Hypertension   . Allergy   . Arthritis   . Cataract     Bilateral  . History of shingles     CURRENT MEDICATIONS: Reviewed per MAR/see medication list  No Known Allergies   REVIEW OF SYSTEMS:  GENERAL: no change in appetite, no fatigue, no weight changes, no fever, chills or weakness RESPIRATORY: no cough, SOB, DOE, wheezing, hemoptysis CARDIAC: no chest pain, edema or palpitations GI: no abdominal pain, diarrhea, constipation, heart burn, nausea or vomiting  PHYSICAL EXAMINATION  GENERAL: no acute distress, normal body habitus  SKIN:  left shin ulcer 0.50.5 cm and mid back surgical incision is dry EYES: conjunctivae normal, sclerae normal, normal eye lids NECK: supple, trachea midline, no neck masses, no thyroid tenderness, no thyromegaly LYMPHATICS: no LAN in the neck, no supraclavicular  LAN RESPIRATORY: breathing is even & unlabored, BS CTAB CARDIAC: RRR, no murmur,no extra heart sounds, no edema GI: abdomen soft, normal BS, no masses, no tenderness, no hepatomegaly, no splenomegaly EXTREMITIES: She is able to move all 4 extremities PSYCHIATRIC: the patient is alert & oriented to person, affect & behavior appropriate  LABS/RADIOLOGY: 05/09/14  WBC 8.7 hemoglobin 10.1 hematocrit 33.2 MCV 84.3 sodium 138 potassium 4.8 glucose 98 BUN 14 creatinine 0.8 calcium 9.9 total protein 6.7 albumin 3.8 ALP 105 AST 25 ALT 26 GFR>60 Labs reviewed: Basic Metabolic Panel:  Recent Labs  06/29/14 1105  NA 137  K 3.9  CL 102  CO2 30  GLUCOSE 89  BUN 17  CREATININE 0.6  CALCIUM 9.8   Liver Function Tests:  Recent Labs  06/29/14 1105  AST 28  ALT 21  ALKPHOS 85  BILITOT 0.5  PROT 7.1  ALBUMIN 4.0   CBC:  Recent Labs  06/29/14 1105  WBC 7.4  NEUTROABS 5.1  HGB 11.2*  HCT 35.2*  MCV 81.4  PLT 270.0   Lipid Panel:  Recent Labs  06/29/14 1105  HDL 49.30     ASSESSMENT/PLAN:  Lumbar spinal stenosis S/P decompression and laminectomy - for home health PT, OT, nursing and home health aide; continue Robaxin 750 mg by mouth twice a day when necessary, oxycodone 5 mg 1 tab by mouth 3 times a day when necessary and Tylenol 325 mg 3 tabs = 975 mg by mouth every 8 hours when necessary for pain management Constipation - continue senna S2 tabs by mouth twice a day and MiraLAX when necessary Hyperlipidemia - continue Lipitor 10 mg by  mouth daily Allergic rhinitis - continue Zyrtec 10 mg by mouth daily at bedtime Neuropathy - stable; continue Neurontin 200 mg by mouth 3 times a day Hypertension - well controlled; continue losartan HCTZ 50-12 0.5 mg by mouth daily Tibial ulcer - continue treatment with medihoney colloid dressing   I have filled out patient's discharge paperwork and written prescriptions.  Patient will receive home health PT, OT, Nursing and home health  aide.  Total discharge time: Greater than 30 minutes  Discharge time involved coordination of the discharge process with social worker, nursing staff and therapy department. Medical justification for home health services verified.     [facility], NP [employer] [phone]

## 2014-07-06 ENCOUNTER — Other Ambulatory Visit: Payer: Self-pay | Admitting: *Deleted

## 2014-07-06 MED ORDER — OXYCODONE HCL 5 MG PO TABS: ORAL_TABLET | ORAL | Status: DC

## 2014-07-06 NOTE — Telephone Encounter (Signed)
[facility] 

## 2014-07-10 DIAGNOSIS — R2689 Other abnormalities of gait and mobility: Secondary | ICD-10-CM | POA: Diagnosis not present

## 2014-07-10 DIAGNOSIS — Q762 Congenital spondylolisthesis: Secondary | ICD-10-CM

## 2014-07-10 DIAGNOSIS — I10 Essential (primary) hypertension: Secondary | ICD-10-CM | POA: Diagnosis not present

## 2014-07-10 DIAGNOSIS — Z981 Arthrodesis status: Secondary | ICD-10-CM | POA: Diagnosis not present

## 2014-07-10 DIAGNOSIS — Z4789 Encounter for other orthopedic aftercare: Secondary | ICD-10-CM | POA: Diagnosis not present

## 2014-07-10 DIAGNOSIS — G629 Polyneuropathy, unspecified: Secondary | ICD-10-CM | POA: Diagnosis not present

## 2014-07-10 DIAGNOSIS — Z96643 Presence of artificial hip joint, bilateral: Secondary | ICD-10-CM | POA: Diagnosis not present

## 2014-07-10 DIAGNOSIS — M4154 Other secondary scoliosis, thoracic region: Secondary | ICD-10-CM | POA: Diagnosis not present

## 2014-07-10 DIAGNOSIS — M1990 Unspecified osteoarthritis, unspecified site: Secondary | ICD-10-CM | POA: Diagnosis not present

## 2014-07-12 DIAGNOSIS — Z4789 Encounter for other orthopedic aftercare: Secondary | ICD-10-CM | POA: Diagnosis not present

## 2014-07-12 DIAGNOSIS — I10 Essential (primary) hypertension: Secondary | ICD-10-CM | POA: Diagnosis not present

## 2014-07-12 DIAGNOSIS — R2689 Other abnormalities of gait and mobility: Secondary | ICD-10-CM | POA: Diagnosis not present

## 2014-07-12 DIAGNOSIS — M4154 Other secondary scoliosis, thoracic region: Secondary | ICD-10-CM | POA: Diagnosis not present

## 2014-07-12 DIAGNOSIS — Q762 Congenital spondylolisthesis: Secondary | ICD-10-CM | POA: Diagnosis not present

## 2014-07-12 DIAGNOSIS — G629 Polyneuropathy, unspecified: Secondary | ICD-10-CM | POA: Diagnosis not present

## 2014-07-14 DIAGNOSIS — G629 Polyneuropathy, unspecified: Secondary | ICD-10-CM | POA: Diagnosis not present

## 2014-07-14 DIAGNOSIS — I10 Essential (primary) hypertension: Secondary | ICD-10-CM | POA: Diagnosis not present

## 2014-07-14 DIAGNOSIS — Z4789 Encounter for other orthopedic aftercare: Secondary | ICD-10-CM | POA: Diagnosis not present

## 2014-07-14 DIAGNOSIS — M4154 Other secondary scoliosis, thoracic region: Secondary | ICD-10-CM | POA: Diagnosis not present

## 2014-07-14 DIAGNOSIS — R2689 Other abnormalities of gait and mobility: Secondary | ICD-10-CM | POA: Diagnosis not present

## 2014-07-14 DIAGNOSIS — Q762 Congenital spondylolisthesis: Secondary | ICD-10-CM | POA: Diagnosis not present

## 2014-07-18 DIAGNOSIS — I10 Essential (primary) hypertension: Secondary | ICD-10-CM | POA: Diagnosis not present

## 2014-07-18 DIAGNOSIS — R2689 Other abnormalities of gait and mobility: Secondary | ICD-10-CM | POA: Diagnosis not present

## 2014-07-18 DIAGNOSIS — G629 Polyneuropathy, unspecified: Secondary | ICD-10-CM | POA: Diagnosis not present

## 2014-07-18 DIAGNOSIS — Z4789 Encounter for other orthopedic aftercare: Secondary | ICD-10-CM | POA: Diagnosis not present

## 2014-07-18 DIAGNOSIS — M4154 Other secondary scoliosis, thoracic region: Secondary | ICD-10-CM | POA: Diagnosis not present

## 2014-07-18 DIAGNOSIS — Q762 Congenital spondylolisthesis: Secondary | ICD-10-CM | POA: Diagnosis not present

## 2014-07-19 ENCOUNTER — Telehealth: Payer: Self-pay | Admitting: *Deleted

## 2014-07-19 DIAGNOSIS — Q762 Congenital spondylolisthesis: Secondary | ICD-10-CM | POA: Diagnosis not present

## 2014-07-19 DIAGNOSIS — M4154 Other secondary scoliosis, thoracic region: Secondary | ICD-10-CM | POA: Diagnosis not present

## 2014-07-19 DIAGNOSIS — Z4789 Encounter for other orthopedic aftercare: Secondary | ICD-10-CM | POA: Diagnosis not present

## 2014-07-19 DIAGNOSIS — G629 Polyneuropathy, unspecified: Secondary | ICD-10-CM | POA: Diagnosis not present

## 2014-07-19 DIAGNOSIS — R2689 Other abnormalities of gait and mobility: Secondary | ICD-10-CM | POA: Diagnosis not present

## 2014-07-19 DIAGNOSIS — I10 Essential (primary) hypertension: Secondary | ICD-10-CM | POA: Diagnosis not present

## 2014-07-19 NOTE — Telephone Encounter (Signed)
Medical records received via fax from Dr. Posey Pronto at [facility]. Forwarded to Jess T. JG//CMA

## 2014-07-20 ENCOUNTER — Telehealth: Payer: Self-pay | Admitting: Family Medicine

## 2014-07-20 DIAGNOSIS — Z4789 Encounter for other orthopedic aftercare: Secondary | ICD-10-CM | POA: Diagnosis not present

## 2014-07-20 DIAGNOSIS — Q762 Congenital spondylolisthesis: Secondary | ICD-10-CM | POA: Diagnosis not present

## 2014-07-20 DIAGNOSIS — G629 Polyneuropathy, unspecified: Secondary | ICD-10-CM | POA: Diagnosis not present

## 2014-07-20 DIAGNOSIS — I10 Essential (primary) hypertension: Secondary | ICD-10-CM | POA: Diagnosis not present

## 2014-07-20 DIAGNOSIS — R2689 Other abnormalities of gait and mobility: Secondary | ICD-10-CM | POA: Diagnosis not present

## 2014-07-20 DIAGNOSIS — M4154 Other secondary scoliosis, thoracic region: Secondary | ICD-10-CM | POA: Diagnosis not present

## 2014-07-20 NOTE — Telephone Encounter (Signed)
Called deanna back and left a message for verbal ok to extend home health aide.

## 2014-07-20 NOTE — Telephone Encounter (Signed)
°  Caller name: Tilda Burrow from [place]  Relation to pt: other  Call back number: [phone]   Reason for call: [place] requesting verbal orders extending home health aide

## 2014-07-21 DIAGNOSIS — Z4789 Encounter for other orthopedic aftercare: Secondary | ICD-10-CM | POA: Diagnosis not present

## 2014-07-21 DIAGNOSIS — Q762 Congenital spondylolisthesis: Secondary | ICD-10-CM | POA: Diagnosis not present

## 2014-07-21 DIAGNOSIS — I10 Essential (primary) hypertension: Secondary | ICD-10-CM | POA: Diagnosis not present

## 2014-07-21 DIAGNOSIS — G629 Polyneuropathy, unspecified: Secondary | ICD-10-CM | POA: Diagnosis not present

## 2014-07-21 DIAGNOSIS — M4154 Other secondary scoliosis, thoracic region: Secondary | ICD-10-CM | POA: Diagnosis not present

## 2014-07-21 DIAGNOSIS — R2689 Other abnormalities of gait and mobility: Secondary | ICD-10-CM | POA: Diagnosis not present

## 2014-07-22 ENCOUNTER — Telehealth: Payer: Self-pay

## 2014-07-22 DIAGNOSIS — Z4789 Encounter for other orthopedic aftercare: Secondary | ICD-10-CM | POA: Diagnosis not present

## 2014-07-22 DIAGNOSIS — M4154 Other secondary scoliosis, thoracic region: Secondary | ICD-10-CM | POA: Diagnosis not present

## 2014-07-22 DIAGNOSIS — G629 Polyneuropathy, unspecified: Secondary | ICD-10-CM | POA: Diagnosis not present

## 2014-07-22 DIAGNOSIS — I10 Essential (primary) hypertension: Secondary | ICD-10-CM | POA: Diagnosis not present

## 2014-07-22 DIAGNOSIS — R2689 Other abnormalities of gait and mobility: Secondary | ICD-10-CM | POA: Diagnosis not present

## 2014-07-22 DIAGNOSIS — Q762 Congenital spondylolisthesis: Secondary | ICD-10-CM | POA: Diagnosis not present

## 2014-07-22 NOTE — Telephone Encounter (Signed)
Form faxed to office regarding folding walker with wheels.  Form labeled and placed in Dr. Virgil Benedict red folder for review and signature.

## 2014-07-25 DIAGNOSIS — Z4789 Encounter for other orthopedic aftercare: Secondary | ICD-10-CM | POA: Diagnosis not present

## 2014-07-25 DIAGNOSIS — R2689 Other abnormalities of gait and mobility: Secondary | ICD-10-CM | POA: Diagnosis not present

## 2014-07-25 DIAGNOSIS — Q762 Congenital spondylolisthesis: Secondary | ICD-10-CM | POA: Diagnosis not present

## 2014-07-25 DIAGNOSIS — G629 Polyneuropathy, unspecified: Secondary | ICD-10-CM | POA: Diagnosis not present

## 2014-07-25 DIAGNOSIS — M4154 Other secondary scoliosis, thoracic region: Secondary | ICD-10-CM | POA: Diagnosis not present

## 2014-07-25 DIAGNOSIS — I10 Essential (primary) hypertension: Secondary | ICD-10-CM | POA: Diagnosis not present

## 2014-07-25 NOTE — Telephone Encounter (Signed)
Fax confirmation received.  Form placed in scan basket for scanning.

## 2014-07-25 NOTE — Telephone Encounter (Signed)
Form faxed to [facility] at [phone].

## 2014-07-26 DIAGNOSIS — M4154 Other secondary scoliosis, thoracic region: Secondary | ICD-10-CM | POA: Diagnosis not present

## 2014-07-26 DIAGNOSIS — Q762 Congenital spondylolisthesis: Secondary | ICD-10-CM | POA: Diagnosis not present

## 2014-07-26 DIAGNOSIS — Z4789 Encounter for other orthopedic aftercare: Secondary | ICD-10-CM | POA: Diagnosis not present

## 2014-07-26 DIAGNOSIS — R2689 Other abnormalities of gait and mobility: Secondary | ICD-10-CM | POA: Diagnosis not present

## 2014-07-26 DIAGNOSIS — G629 Polyneuropathy, unspecified: Secondary | ICD-10-CM | POA: Diagnosis not present

## 2014-07-26 DIAGNOSIS — I10 Essential (primary) hypertension: Secondary | ICD-10-CM | POA: Diagnosis not present

## 2014-07-27 DIAGNOSIS — Q762 Congenital spondylolisthesis: Secondary | ICD-10-CM | POA: Diagnosis not present

## 2014-07-27 DIAGNOSIS — I10 Essential (primary) hypertension: Secondary | ICD-10-CM | POA: Diagnosis not present

## 2014-07-27 DIAGNOSIS — G629 Polyneuropathy, unspecified: Secondary | ICD-10-CM | POA: Diagnosis not present

## 2014-07-27 DIAGNOSIS — Z4789 Encounter for other orthopedic aftercare: Secondary | ICD-10-CM | POA: Diagnosis not present

## 2014-07-27 DIAGNOSIS — R2689 Other abnormalities of gait and mobility: Secondary | ICD-10-CM | POA: Diagnosis not present

## 2014-07-27 DIAGNOSIS — M170 Bilateral primary osteoarthritis of knee: Secondary | ICD-10-CM | POA: Diagnosis not present

## 2014-07-27 DIAGNOSIS — M4154 Other secondary scoliosis, thoracic region: Secondary | ICD-10-CM | POA: Diagnosis not present

## 2014-07-28 DIAGNOSIS — G629 Polyneuropathy, unspecified: Secondary | ICD-10-CM | POA: Diagnosis not present

## 2014-07-28 DIAGNOSIS — I10 Essential (primary) hypertension: Secondary | ICD-10-CM | POA: Diagnosis not present

## 2014-07-28 DIAGNOSIS — R2689 Other abnormalities of gait and mobility: Secondary | ICD-10-CM | POA: Diagnosis not present

## 2014-07-28 DIAGNOSIS — M4154 Other secondary scoliosis, thoracic region: Secondary | ICD-10-CM | POA: Diagnosis not present

## 2014-07-28 DIAGNOSIS — Q762 Congenital spondylolisthesis: Secondary | ICD-10-CM | POA: Diagnosis not present

## 2014-07-28 DIAGNOSIS — Z4789 Encounter for other orthopedic aftercare: Secondary | ICD-10-CM | POA: Diagnosis not present

## 2014-07-29 DIAGNOSIS — G629 Polyneuropathy, unspecified: Secondary | ICD-10-CM | POA: Diagnosis not present

## 2014-07-29 DIAGNOSIS — Q762 Congenital spondylolisthesis: Secondary | ICD-10-CM | POA: Diagnosis not present

## 2014-07-29 DIAGNOSIS — M4154 Other secondary scoliosis, thoracic region: Secondary | ICD-10-CM | POA: Diagnosis not present

## 2014-07-29 DIAGNOSIS — Z4789 Encounter for other orthopedic aftercare: Secondary | ICD-10-CM | POA: Diagnosis not present

## 2014-07-29 DIAGNOSIS — I10 Essential (primary) hypertension: Secondary | ICD-10-CM | POA: Diagnosis not present

## 2014-07-29 DIAGNOSIS — R2689 Other abnormalities of gait and mobility: Secondary | ICD-10-CM | POA: Diagnosis not present

## 2014-08-01 DIAGNOSIS — Q762 Congenital spondylolisthesis: Secondary | ICD-10-CM | POA: Diagnosis not present

## 2014-08-01 DIAGNOSIS — Z4789 Encounter for other orthopedic aftercare: Secondary | ICD-10-CM | POA: Diagnosis not present

## 2014-08-01 DIAGNOSIS — I10 Essential (primary) hypertension: Secondary | ICD-10-CM | POA: Diagnosis not present

## 2014-08-01 DIAGNOSIS — G629 Polyneuropathy, unspecified: Secondary | ICD-10-CM | POA: Diagnosis not present

## 2014-08-01 DIAGNOSIS — M4154 Other secondary scoliosis, thoracic region: Secondary | ICD-10-CM | POA: Diagnosis not present

## 2014-08-01 DIAGNOSIS — R2689 Other abnormalities of gait and mobility: Secondary | ICD-10-CM | POA: Diagnosis not present

## 2014-08-02 DIAGNOSIS — I10 Essential (primary) hypertension: Secondary | ICD-10-CM | POA: Diagnosis not present

## 2014-08-02 DIAGNOSIS — G629 Polyneuropathy, unspecified: Secondary | ICD-10-CM | POA: Diagnosis not present

## 2014-08-02 DIAGNOSIS — R2689 Other abnormalities of gait and mobility: Secondary | ICD-10-CM | POA: Diagnosis not present

## 2014-08-02 DIAGNOSIS — Q762 Congenital spondylolisthesis: Secondary | ICD-10-CM | POA: Diagnosis not present

## 2014-08-02 DIAGNOSIS — M4154 Other secondary scoliosis, thoracic region: Secondary | ICD-10-CM | POA: Diagnosis not present

## 2014-08-02 DIAGNOSIS — Z4789 Encounter for other orthopedic aftercare: Secondary | ICD-10-CM | POA: Diagnosis not present

## 2014-08-03 DIAGNOSIS — M4714 Other spondylosis with myelopathy, thoracic region: Secondary | ICD-10-CM | POA: Diagnosis not present

## 2014-08-04 DIAGNOSIS — Z4789 Encounter for other orthopedic aftercare: Secondary | ICD-10-CM | POA: Diagnosis not present

## 2014-08-04 DIAGNOSIS — Q762 Congenital spondylolisthesis: Secondary | ICD-10-CM | POA: Diagnosis not present

## 2014-08-04 DIAGNOSIS — G629 Polyneuropathy, unspecified: Secondary | ICD-10-CM | POA: Diagnosis not present

## 2014-08-04 DIAGNOSIS — I10 Essential (primary) hypertension: Secondary | ICD-10-CM | POA: Diagnosis not present

## 2014-08-04 DIAGNOSIS — M4154 Other secondary scoliosis, thoracic region: Secondary | ICD-10-CM | POA: Diagnosis not present

## 2014-08-04 DIAGNOSIS — R2689 Other abnormalities of gait and mobility: Secondary | ICD-10-CM | POA: Diagnosis not present

## 2014-08-05 DIAGNOSIS — Q762 Congenital spondylolisthesis: Secondary | ICD-10-CM | POA: Diagnosis not present

## 2014-08-05 DIAGNOSIS — G629 Polyneuropathy, unspecified: Secondary | ICD-10-CM | POA: Diagnosis not present

## 2014-08-05 DIAGNOSIS — Z4789 Encounter for other orthopedic aftercare: Secondary | ICD-10-CM | POA: Diagnosis not present

## 2014-08-05 DIAGNOSIS — M4154 Other secondary scoliosis, thoracic region: Secondary | ICD-10-CM | POA: Diagnosis not present

## 2014-08-05 DIAGNOSIS — I10 Essential (primary) hypertension: Secondary | ICD-10-CM | POA: Diagnosis not present

## 2014-08-05 DIAGNOSIS — R2689 Other abnormalities of gait and mobility: Secondary | ICD-10-CM | POA: Diagnosis not present

## 2014-08-08 DIAGNOSIS — G629 Polyneuropathy, unspecified: Secondary | ICD-10-CM | POA: Diagnosis not present

## 2014-08-08 DIAGNOSIS — R2689 Other abnormalities of gait and mobility: Secondary | ICD-10-CM | POA: Diagnosis not present

## 2014-08-08 DIAGNOSIS — I10 Essential (primary) hypertension: Secondary | ICD-10-CM | POA: Diagnosis not present

## 2014-08-08 DIAGNOSIS — Z4789 Encounter for other orthopedic aftercare: Secondary | ICD-10-CM | POA: Diagnosis not present

## 2014-08-08 DIAGNOSIS — M4154 Other secondary scoliosis, thoracic region: Secondary | ICD-10-CM | POA: Diagnosis not present

## 2014-08-08 DIAGNOSIS — Q762 Congenital spondylolisthesis: Secondary | ICD-10-CM | POA: Diagnosis not present

## 2014-08-09 DIAGNOSIS — G629 Polyneuropathy, unspecified: Secondary | ICD-10-CM | POA: Diagnosis not present

## 2014-08-09 DIAGNOSIS — Z4789 Encounter for other orthopedic aftercare: Secondary | ICD-10-CM | POA: Diagnosis not present

## 2014-08-09 DIAGNOSIS — Q762 Congenital spondylolisthesis: Secondary | ICD-10-CM | POA: Diagnosis not present

## 2014-08-09 DIAGNOSIS — R2689 Other abnormalities of gait and mobility: Secondary | ICD-10-CM | POA: Diagnosis not present

## 2014-08-09 DIAGNOSIS — M4154 Other secondary scoliosis, thoracic region: Secondary | ICD-10-CM | POA: Diagnosis not present

## 2014-08-09 DIAGNOSIS — I10 Essential (primary) hypertension: Secondary | ICD-10-CM | POA: Diagnosis not present

## 2014-08-11 DIAGNOSIS — M4154 Other secondary scoliosis, thoracic region: Secondary | ICD-10-CM | POA: Diagnosis not present

## 2014-08-11 DIAGNOSIS — Z4789 Encounter for other orthopedic aftercare: Secondary | ICD-10-CM | POA: Diagnosis not present

## 2014-08-11 DIAGNOSIS — G629 Polyneuropathy, unspecified: Secondary | ICD-10-CM | POA: Diagnosis not present

## 2014-08-11 DIAGNOSIS — I10 Essential (primary) hypertension: Secondary | ICD-10-CM | POA: Diagnosis not present

## 2014-08-11 DIAGNOSIS — Q762 Congenital spondylolisthesis: Secondary | ICD-10-CM | POA: Diagnosis not present

## 2014-08-11 DIAGNOSIS — R2689 Other abnormalities of gait and mobility: Secondary | ICD-10-CM | POA: Diagnosis not present

## 2014-08-12 DIAGNOSIS — Z4789 Encounter for other orthopedic aftercare: Secondary | ICD-10-CM | POA: Diagnosis not present

## 2014-08-12 DIAGNOSIS — M4154 Other secondary scoliosis, thoracic region: Secondary | ICD-10-CM | POA: Diagnosis not present

## 2014-08-12 DIAGNOSIS — R2689 Other abnormalities of gait and mobility: Secondary | ICD-10-CM | POA: Diagnosis not present

## 2014-08-12 DIAGNOSIS — I10 Essential (primary) hypertension: Secondary | ICD-10-CM | POA: Diagnosis not present

## 2014-08-12 DIAGNOSIS — G629 Polyneuropathy, unspecified: Secondary | ICD-10-CM | POA: Diagnosis not present

## 2014-08-12 DIAGNOSIS — Q762 Congenital spondylolisthesis: Secondary | ICD-10-CM | POA: Diagnosis not present

## 2014-08-16 ENCOUNTER — Telehealth: Payer: Self-pay | Admitting: Family Medicine

## 2014-08-16 DIAGNOSIS — R2689 Other abnormalities of gait and mobility: Secondary | ICD-10-CM | POA: Diagnosis not present

## 2014-08-16 DIAGNOSIS — Q762 Congenital spondylolisthesis: Secondary | ICD-10-CM | POA: Diagnosis not present

## 2014-08-16 DIAGNOSIS — I10 Essential (primary) hypertension: Secondary | ICD-10-CM | POA: Diagnosis not present

## 2014-08-16 DIAGNOSIS — G629 Polyneuropathy, unspecified: Secondary | ICD-10-CM | POA: Diagnosis not present

## 2014-08-16 DIAGNOSIS — Z4789 Encounter for other orthopedic aftercare: Secondary | ICD-10-CM | POA: Diagnosis not present

## 2014-08-16 DIAGNOSIS — R29898 Other symptoms and signs involving the musculoskeletal system: Secondary | ICD-10-CM

## 2014-08-16 DIAGNOSIS — M4154 Other secondary scoliosis, thoracic region: Secondary | ICD-10-CM | POA: Diagnosis not present

## 2014-08-16 NOTE — Telephone Encounter (Signed)
Caller name: Zollie Pee  Relation to pt: home: [place] [facility]   Call back number: [phone]   Reason for call:  Requesting orders for [facility] outpatient facility in [place] fax # [phone] orders requesting physical therapy. Weakness in right leg since back surgery.

## 2014-08-17 NOTE — Telephone Encounter (Signed)
Orders placed and note routed to [facility].

## 2014-08-17 NOTE — Telephone Encounter (Signed)
Referral faxed

## 2014-08-17 NOTE — Telephone Encounter (Signed)
Pt seen 06/29/14, ok for orders for PT?

## 2014-08-17 NOTE — Telephone Encounter (Signed)
Ok for verbal order  °

## 2014-08-18 DIAGNOSIS — M4154 Other secondary scoliosis, thoracic region: Secondary | ICD-10-CM | POA: Diagnosis not present

## 2014-08-18 DIAGNOSIS — Q762 Congenital spondylolisthesis: Secondary | ICD-10-CM | POA: Diagnosis not present

## 2014-08-18 DIAGNOSIS — G629 Polyneuropathy, unspecified: Secondary | ICD-10-CM | POA: Diagnosis not present

## 2014-08-18 DIAGNOSIS — Z4789 Encounter for other orthopedic aftercare: Secondary | ICD-10-CM | POA: Diagnosis not present

## 2014-08-18 DIAGNOSIS — I10 Essential (primary) hypertension: Secondary | ICD-10-CM | POA: Diagnosis not present

## 2014-08-18 DIAGNOSIS — R2689 Other abnormalities of gait and mobility: Secondary | ICD-10-CM | POA: Diagnosis not present

## 2014-08-19 DIAGNOSIS — M4154 Other secondary scoliosis, thoracic region: Secondary | ICD-10-CM | POA: Diagnosis not present

## 2014-08-19 DIAGNOSIS — R2689 Other abnormalities of gait and mobility: Secondary | ICD-10-CM | POA: Diagnosis not present

## 2014-08-19 DIAGNOSIS — Q762 Congenital spondylolisthesis: Secondary | ICD-10-CM | POA: Diagnosis not present

## 2014-08-19 DIAGNOSIS — I10 Essential (primary) hypertension: Secondary | ICD-10-CM | POA: Diagnosis not present

## 2014-08-19 DIAGNOSIS — Z4789 Encounter for other orthopedic aftercare: Secondary | ICD-10-CM | POA: Diagnosis not present

## 2014-08-19 DIAGNOSIS — G629 Polyneuropathy, unspecified: Secondary | ICD-10-CM | POA: Diagnosis not present

## 2014-08-31 DIAGNOSIS — R29898 Other symptoms and signs involving the musculoskeletal system: Secondary | ICD-10-CM | POA: Diagnosis not present

## 2014-08-31 DIAGNOSIS — R2689 Other abnormalities of gait and mobility: Secondary | ICD-10-CM | POA: Diagnosis not present

## 2014-08-31 DIAGNOSIS — R2681 Unsteadiness on feet: Secondary | ICD-10-CM | POA: Diagnosis not present

## 2014-08-31 DIAGNOSIS — M545 Low back pain: Secondary | ICD-10-CM | POA: Diagnosis not present

## 2014-09-05 DIAGNOSIS — M545 Low back pain: Secondary | ICD-10-CM | POA: Diagnosis not present

## 2014-09-05 DIAGNOSIS — R2681 Unsteadiness on feet: Secondary | ICD-10-CM | POA: Diagnosis not present

## 2014-09-05 DIAGNOSIS — R2689 Other abnormalities of gait and mobility: Secondary | ICD-10-CM | POA: Diagnosis not present

## 2014-09-05 DIAGNOSIS — R29898 Other symptoms and signs involving the musculoskeletal system: Secondary | ICD-10-CM | POA: Diagnosis not present

## 2014-09-08 DIAGNOSIS — R2681 Unsteadiness on feet: Secondary | ICD-10-CM | POA: Diagnosis not present

## 2014-09-08 DIAGNOSIS — R29898 Other symptoms and signs involving the musculoskeletal system: Secondary | ICD-10-CM | POA: Diagnosis not present

## 2014-09-08 DIAGNOSIS — M545 Low back pain: Secondary | ICD-10-CM | POA: Diagnosis not present

## 2014-09-08 DIAGNOSIS — R2689 Other abnormalities of gait and mobility: Secondary | ICD-10-CM | POA: Diagnosis not present

## 2014-09-14 DIAGNOSIS — R2689 Other abnormalities of gait and mobility: Secondary | ICD-10-CM | POA: Diagnosis not present

## 2014-09-14 DIAGNOSIS — R2681 Unsteadiness on feet: Secondary | ICD-10-CM | POA: Diagnosis not present

## 2014-09-14 DIAGNOSIS — R29898 Other symptoms and signs involving the musculoskeletal system: Secondary | ICD-10-CM | POA: Diagnosis not present

## 2014-09-14 DIAGNOSIS — M545 Low back pain: Secondary | ICD-10-CM | POA: Diagnosis not present

## 2014-09-29 DIAGNOSIS — M545 Low back pain: Secondary | ICD-10-CM | POA: Diagnosis not present

## 2014-09-29 DIAGNOSIS — R29898 Other symptoms and signs involving the musculoskeletal system: Secondary | ICD-10-CM | POA: Diagnosis not present

## 2014-09-29 DIAGNOSIS — R2681 Unsteadiness on feet: Secondary | ICD-10-CM | POA: Diagnosis not present

## 2014-09-29 DIAGNOSIS — R2689 Other abnormalities of gait and mobility: Secondary | ICD-10-CM | POA: Diagnosis not present

## 2014-10-03 DIAGNOSIS — R2681 Unsteadiness on feet: Secondary | ICD-10-CM | POA: Diagnosis not present

## 2014-10-03 DIAGNOSIS — R29898 Other symptoms and signs involving the musculoskeletal system: Secondary | ICD-10-CM | POA: Diagnosis not present

## 2014-10-03 DIAGNOSIS — R2689 Other abnormalities of gait and mobility: Secondary | ICD-10-CM | POA: Diagnosis not present

## 2014-10-03 DIAGNOSIS — M545 Low back pain: Secondary | ICD-10-CM | POA: Diagnosis not present

## 2014-10-05 DIAGNOSIS — M545 Low back pain: Secondary | ICD-10-CM | POA: Diagnosis not present

## 2014-10-05 DIAGNOSIS — R2681 Unsteadiness on feet: Secondary | ICD-10-CM | POA: Diagnosis not present

## 2014-10-05 DIAGNOSIS — R2689 Other abnormalities of gait and mobility: Secondary | ICD-10-CM | POA: Diagnosis not present

## 2014-10-05 DIAGNOSIS — R29898 Other symptoms and signs involving the musculoskeletal system: Secondary | ICD-10-CM | POA: Diagnosis not present

## 2014-10-10 DIAGNOSIS — R29898 Other symptoms and signs involving the musculoskeletal system: Secondary | ICD-10-CM | POA: Diagnosis not present

## 2014-10-10 DIAGNOSIS — R2681 Unsteadiness on feet: Secondary | ICD-10-CM | POA: Diagnosis not present

## 2014-10-10 DIAGNOSIS — M545 Low back pain: Secondary | ICD-10-CM | POA: Diagnosis not present

## 2014-10-10 DIAGNOSIS — R2689 Other abnormalities of gait and mobility: Secondary | ICD-10-CM | POA: Diagnosis not present

## 2014-10-14 DIAGNOSIS — R29898 Other symptoms and signs involving the musculoskeletal system: Secondary | ICD-10-CM | POA: Diagnosis not present

## 2014-10-14 DIAGNOSIS — R2681 Unsteadiness on feet: Secondary | ICD-10-CM | POA: Diagnosis not present

## 2014-10-14 DIAGNOSIS — M545 Low back pain: Secondary | ICD-10-CM | POA: Diagnosis not present

## 2014-10-14 DIAGNOSIS — R2689 Other abnormalities of gait and mobility: Secondary | ICD-10-CM | POA: Diagnosis not present

## 2014-10-17 DIAGNOSIS — M545 Low back pain: Secondary | ICD-10-CM | POA: Diagnosis not present

## 2014-10-17 DIAGNOSIS — R29898 Other symptoms and signs involving the musculoskeletal system: Secondary | ICD-10-CM | POA: Diagnosis not present

## 2014-10-17 DIAGNOSIS — R2689 Other abnormalities of gait and mobility: Secondary | ICD-10-CM | POA: Diagnosis not present

## 2014-10-17 DIAGNOSIS — R2681 Unsteadiness on feet: Secondary | ICD-10-CM | POA: Diagnosis not present

## 2014-10-20 DIAGNOSIS — R2689 Other abnormalities of gait and mobility: Secondary | ICD-10-CM | POA: Diagnosis not present

## 2014-10-20 DIAGNOSIS — M545 Low back pain: Secondary | ICD-10-CM | POA: Diagnosis not present

## 2014-10-20 DIAGNOSIS — R29898 Other symptoms and signs involving the musculoskeletal system: Secondary | ICD-10-CM | POA: Diagnosis not present

## 2014-10-20 DIAGNOSIS — R2681 Unsteadiness on feet: Secondary | ICD-10-CM | POA: Diagnosis not present

## 2014-10-26 ENCOUNTER — Encounter: Payer: Self-pay | Admitting: *Deleted

## 2014-10-26 ENCOUNTER — Telehealth: Payer: Self-pay | Admitting: *Deleted

## 2014-10-26 DIAGNOSIS — R2681 Unsteadiness on feet: Secondary | ICD-10-CM | POA: Diagnosis not present

## 2014-10-26 DIAGNOSIS — M545 Low back pain: Secondary | ICD-10-CM | POA: Diagnosis not present

## 2014-10-26 DIAGNOSIS — R29898 Other symptoms and signs involving the musculoskeletal system: Secondary | ICD-10-CM | POA: Diagnosis not present

## 2014-10-26 DIAGNOSIS — R2689 Other abnormalities of gait and mobility: Secondary | ICD-10-CM | POA: Diagnosis not present

## 2014-10-26 NOTE — Telephone Encounter (Signed)
Patient son, Cloria Spring returning phone call. [facility] # [phone]

## 2014-10-26 NOTE — Telephone Encounter (Signed)
Unable to reach patient at time of Pre-Visit Call.  Left message for patient to return call when available.    

## 2014-10-26 NOTE — Telephone Encounter (Signed)
Pre-Visit Call completed with patient and chart updated.   Pre-Visit Info documented in Specialty Comments under [employer].    

## 2014-10-26 NOTE — Addendum Note (Signed)
Addended by: Leticia Penna A on: 10/26/2014 11:59 AM   Modules accepted: Medications

## 2014-10-27 ENCOUNTER — Ambulatory Visit (INDEPENDENT_AMBULATORY_CARE_PROVIDER_SITE_OTHER): Payer: Medicare Other | Admitting: Family Medicine

## 2014-10-27 ENCOUNTER — Encounter: Payer: Self-pay | Admitting: Family Medicine

## 2014-10-27 VITALS — BP 118/70 | HR 56 | Temp 97.9°F | Resp 16 | Ht 64.5 in | Wt 159.4 lb

## 2014-10-27 DIAGNOSIS — E785 Hyperlipidemia, unspecified: Secondary | ICD-10-CM

## 2014-10-27 DIAGNOSIS — Z1231 Encounter for screening mammogram for malignant neoplasm of breast: Secondary | ICD-10-CM

## 2014-10-27 DIAGNOSIS — E2839 Other primary ovarian failure: Secondary | ICD-10-CM | POA: Diagnosis not present

## 2014-10-27 DIAGNOSIS — I10 Essential (primary) hypertension: Secondary | ICD-10-CM

## 2014-10-27 DIAGNOSIS — Z0000 Encounter for general adult medical examination without abnormal findings: Secondary | ICD-10-CM | POA: Diagnosis not present

## 2014-10-27 LAB — HEPATIC FUNCTION PANEL
ALK PHOS: 92 U/L (ref 39–117)
ALT: 20 U/L (ref 0–35)
AST: 32 U/L (ref 0–37)
Albumin: 3.9 g/dL (ref 3.5–5.2)
BILIRUBIN DIRECT: 0.1 mg/dL (ref 0.0–0.3)
TOTAL PROTEIN: 7.4 g/dL (ref 6.0–8.3)
Total Bilirubin: 0.4 mg/dL (ref 0.2–1.2)

## 2014-10-27 LAB — CBC WITH DIFFERENTIAL/PLATELET
BASOS PCT: 0.5 % (ref 0.0–3.0)
Basophils Absolute: 0.1 10*3/uL (ref 0.0–0.1)
EOS PCT: 0.9 % (ref 0.0–5.0)
Eosinophils Absolute: 0.1 10*3/uL (ref 0.0–0.7)
HCT: 38.1 % (ref 36.0–46.0)
HEMOGLOBIN: 12.6 g/dL (ref 12.0–15.0)
LYMPHS PCT: 11 % — AB (ref 12.0–46.0)
Lymphs Abs: 1.1 10*3/uL (ref 0.7–4.0)
MCHC: 33.1 g/dL (ref 30.0–36.0)
MCV: 79.6 fl (ref 78.0–100.0)
Monocytes Absolute: 0.5 10*3/uL (ref 0.1–1.0)
Monocytes Relative: 4.7 % (ref 3.0–12.0)
NEUTROS ABS: 8.2 10*3/uL — AB (ref 1.4–7.7)
Neutrophils Relative %: 82.9 % — ABNORMAL HIGH (ref 43.0–77.0)
Platelets: 244 10*3/uL (ref 150.0–400.0)
RBC: 4.79 Mil/uL (ref 3.87–5.11)
RDW: 15.2 % (ref 11.5–15.5)
WBC: 9.9 10*3/uL (ref 4.0–10.5)

## 2014-10-27 LAB — TSH: TSH: 1.31 u[IU]/mL (ref 0.35–4.50)

## 2014-10-27 LAB — BASIC METABOLIC PANEL
BUN: 19 mg/dL (ref 6–23)
CO2: 29 meq/L (ref 19–32)
CREATININE: 0.72 mg/dL (ref 0.40–1.20)
Calcium: 10 mg/dL (ref 8.4–10.5)
Chloride: 102 mEq/L (ref 96–112)
GFR: 99.03 mL/min (ref >60.00)
Glucose, Bld: 95 mg/dL (ref 70–99)
POTASSIUM: 4.1 meq/L (ref 3.5–5.1)
Sodium: 138 mEq/L (ref 135–145)

## 2014-10-27 LAB — LIPID PANEL
CHOL/HDL RATIO: 3
Cholesterol: 183 mg/dL (ref 0–200)
HDL: 55.1 mg/dL (ref >39.00)
LDL Cholesterol: 109 mg/dL — ABNORMAL HIGH (ref 0–99)
NonHDL: 127.9
TRIGLYCERIDES: 97 mg/dL (ref 0.0–149.0)
VLDL: 19.4 mg/dL (ref 0.0–40.0)

## 2014-10-27 NOTE — Patient Instructions (Signed)
Follow up in 6 months to recheck BP and cholesterol We'll notify you of your lab results and make any changes if needed We'll [facility] Keep up the good work!  You look great! Call with any questions or concerns Happy Mother's Day!!

## 2014-10-27 NOTE — Progress Notes (Signed)
   Subjective:    Patient ID: Katherine Owen, female    DOB: [birth year], 81 y.o.   MRN: [id number]  HPI Here today for CPE.  Risk Factors: HTN- chronic problem, well controlled on Losartan HCTZ.  Has lost 6 lbs.  Denies CP, SOB, HAs, visual changes, edema Hyperlipidemia- chronic problem, on Lipitor.  Denies abd pain, N/V. Physical Activity: doing regular PT Fall Risk: mildly increased due to recent spinal fusion and leg weakness Depression: denies current sxs Hearing: normal to conversational tones, mildly decreased to whispered voice ADL's: independent Cognitive: normal linear thought process, memory and attention intact Home Safety: safe at home, lives w/ son Height, Weight, BMI, Visual Acuity: see vitals, vision corrected to 20/20 w/ glasses Counseling: UTD on colonoscopy, due for DEXA and mammo.  UTD on pneumonia shots. Labs Ordered: See A&P Care Plan: See A&P    Review of Systems Patient reports no vision/ hearing changes, adenopathy,fever, weight change,  persistant/recurrent hoarseness , swallowing issues, chest pain, palpitations, edema, persistant/recurrent cough, hemoptysis, dyspnea (rest/exertional/paroxysmal nocturnal), gastrointestinal bleeding (melena, rectal bleeding), abdominal pain, significant heartburn, bowel changes, GU symptoms (dysuria, hematuria, incontinence), Gyn symptoms (abnormal  bleeding, pain),  syncope, memory loss, numbness & tingling, skin/hair/nail changes, abnormal bruising or bleeding, anxiety, or depression.   R leg weakness    Objective:   Physical Exam General Appearance:    Alert, cooperative, no distress, appears stated age  Head:    Normocephalic, without obvious abnormality, atraumatic  Eyes:    PERRL, conjunctiva/corneas clear, EOM's intact, fundi    benign, both eyes  Ears:    Normal TM's and external ear canals, both ears  Nose:   Nares normal, septum midline, mucosa normal, no drainage    or sinus tenderness  Throat:   Lips, mucosa,  and tongue normal; teeth and gums normal  Neck:   Supple, symmetrical, trachea midline, no adenopathy;    Thyroid: no enlargement/tenderness/nodules  Back:     Symmetric, no curvature, ROM normal, no CVA tenderness  Lungs:     Clear to auscultation bilaterally, respirations unlabored  Chest Wall:    No tenderness or deformity   Heart:    Regular rate and rhythm, S1 and S2 normal, no murmur, rub   or gallop  Breast Exam:    Deferred to mammo  Abdomen:     Soft, non-tender, bowel sounds active all four quadrants,    no masses, no organomegaly  Genitalia:    Deferred  Rectal:    Extremities:   Extremities normal, atraumatic, no cyanosis or edema  Pulses:   2+ and symmetric all extremities  Skin:   Skin color, texture, turgor normal, no rashes or lesions  Lymph nodes:   Cervical, supraclavicular, and axillary nodes normal  Neurologic:   CNII-XII intact, R LE weakness          Assessment & Plan:

## 2014-10-27 NOTE — Assessment & Plan Note (Signed)
Pt's PE unchanged from previous and WNL for pt.  UTD on colonoscopy per report- will not require another.  Due for mammo and DEXA- orders entered.  Written screening schedule updated and given to pt.  UTD on immunizations.  Check labs.  Anticipatory guidance provided.

## 2014-10-27 NOTE — Assessment & Plan Note (Signed)
Chronic problem.  Tolerating statin w/o difficulty.  Check labs.  Adjust meds prn  

## 2014-10-27 NOTE — Assessment & Plan Note (Signed)
Chronic problem.  Excellent BP control today.  Asymptomatic.  Exercising regularly via PT.  Check labs.  No anticipated med changes.

## 2014-10-27 NOTE — Progress Notes (Signed)
Pre visit review using our clinic review tool, if applicable. No additional management support is needed unless otherwise documented below in the visit note. 

## 2014-10-28 ENCOUNTER — Encounter: Payer: Self-pay | Admitting: General Practice

## 2014-10-31 DIAGNOSIS — M545 Low back pain: Secondary | ICD-10-CM | POA: Diagnosis not present

## 2014-10-31 DIAGNOSIS — R2681 Unsteadiness on feet: Secondary | ICD-10-CM | POA: Diagnosis not present

## 2014-10-31 DIAGNOSIS — R2689 Other abnormalities of gait and mobility: Secondary | ICD-10-CM | POA: Diagnosis not present

## 2014-10-31 DIAGNOSIS — R29898 Other symptoms and signs involving the musculoskeletal system: Secondary | ICD-10-CM | POA: Diagnosis not present

## 2014-11-16 DIAGNOSIS — R2689 Other abnormalities of gait and mobility: Secondary | ICD-10-CM | POA: Diagnosis not present

## 2014-11-16 DIAGNOSIS — R29898 Other symptoms and signs involving the musculoskeletal system: Secondary | ICD-10-CM | POA: Diagnosis not present

## 2014-11-16 DIAGNOSIS — M545 Low back pain: Secondary | ICD-10-CM | POA: Diagnosis not present

## 2014-11-18 ENCOUNTER — Ambulatory Visit
Admission: RE | Admit: 2014-11-18 | Discharge: 2014-11-18 | Disposition: A | Discharge status: Home / Self Care | Payer: Medicare Other | Source: Ambulatory Visit | Attending: Family Medicine | Admitting: Family Medicine

## 2014-11-18 DIAGNOSIS — Z1231 Encounter for screening mammogram for malignant neoplasm of breast: Secondary | ICD-10-CM

## 2014-11-18 DIAGNOSIS — E2839 Other primary ovarian failure: Secondary | ICD-10-CM

## 2014-11-18 DIAGNOSIS — M85832 Other specified disorders of bone density and structure, left forearm: Secondary | ICD-10-CM | POA: Diagnosis not present

## 2014-11-18 LAB — HM DEXA SCAN

## 2014-11-18 LAB — HM MAMMOGRAPHY: HM MAMMO: NORMAL (ref 0–4)

## 2014-11-23 ENCOUNTER — Encounter: Payer: Self-pay | Admitting: General Practice

## 2014-12-07 ENCOUNTER — Encounter: Payer: Self-pay | Admitting: General Practice

## 2014-12-22 ENCOUNTER — Encounter: Payer: Medicare Other | Admitting: Family Medicine

## 2015-02-21 DIAGNOSIS — H40013 Open angle with borderline findings, low risk, bilateral: Secondary | ICD-10-CM | POA: Diagnosis not present

## 2015-03-20 DIAGNOSIS — E780 Pure hypercholesterolemia: Secondary | ICD-10-CM | POA: Diagnosis not present

## 2015-03-20 DIAGNOSIS — Z23 Encounter for immunization: Secondary | ICD-10-CM | POA: Diagnosis not present

## 2015-03-20 DIAGNOSIS — M5126 Other intervertebral disc displacement, lumbar region: Secondary | ICD-10-CM | POA: Diagnosis not present

## 2015-03-20 DIAGNOSIS — I10 Essential (primary) hypertension: Secondary | ICD-10-CM | POA: Diagnosis not present

## 2015-04-19 DIAGNOSIS — M4106 Infantile idiopathic scoliosis, lumbar region: Secondary | ICD-10-CM | POA: Diagnosis not present

## 2015-04-19 DIAGNOSIS — M5135 Other intervertebral disc degeneration, thoracolumbar region: Secondary | ICD-10-CM | POA: Diagnosis not present

## 2015-04-19 DIAGNOSIS — M5104 Intervertebral disc disorders with myelopathy, thoracic region: Secondary | ICD-10-CM | POA: Diagnosis not present

## 2015-04-19 DIAGNOSIS — Z981 Arthrodesis status: Secondary | ICD-10-CM | POA: Diagnosis not present

## 2015-04-19 DIAGNOSIS — M4156 Other secondary scoliosis, lumbar region: Secondary | ICD-10-CM | POA: Diagnosis not present

## 2015-05-01 ENCOUNTER — Telehealth: Payer: Self-pay | Admitting: Family Medicine

## 2015-05-01 ENCOUNTER — Encounter: Payer: Self-pay | Admitting: Family Medicine

## 2015-05-01 ENCOUNTER — Ambulatory Visit (INDEPENDENT_AMBULATORY_CARE_PROVIDER_SITE_OTHER): Payer: Medicare Other | Admitting: Family Medicine

## 2015-05-01 ENCOUNTER — Telehealth: Payer: Self-pay | Admitting: General Practice

## 2015-05-01 VITALS — BP 120/72 | HR 62 | Temp 98.7°F | Resp 16 | Ht 65.0 in | Wt 149.0 lb

## 2015-05-01 DIAGNOSIS — I10 Essential (primary) hypertension: Secondary | ICD-10-CM | POA: Diagnosis not present

## 2015-05-01 DIAGNOSIS — E785 Hyperlipidemia, unspecified: Secondary | ICD-10-CM | POA: Diagnosis not present

## 2015-05-01 LAB — LIPID PANEL
Cholesterol: 229 mg/dL — ABNORMAL HIGH (ref 0–200)
HDL: 51 mg/dL (ref >39.00)
LDL Cholesterol: 151 mg/dL — ABNORMAL HIGH (ref 0–99)
NonHDL: 178.2
Total CHOL/HDL Ratio: 4
Triglycerides: 137 mg/dL (ref 0.0–149.0)
VLDL: 27.4 mg/dL (ref 0.0–40.0)

## 2015-05-01 LAB — CBC WITH DIFFERENTIAL/PLATELET
Basophils Absolute: 0 10*3/uL (ref 0.0–0.1)
Basophils Relative: 0.4 % (ref 0.0–3.0)
EOS PCT: 0.6 % (ref 0.0–5.0)
Eosinophils Absolute: 0.1 10*3/uL (ref 0.0–0.7)
HCT: 38.4 % (ref 36.0–46.0)
Hemoglobin: 12.4 g/dL (ref 12.0–15.0)
LYMPHS ABS: 1 10*3/uL (ref 0.7–4.0)
Lymphocytes Relative: 11.5 % — ABNORMAL LOW (ref 12.0–46.0)
MCHC: 32.3 g/dL (ref 30.0–36.0)
MCV: 81.7 fl (ref 78.0–100.0)
Monocytes Absolute: 0.5 10*3/uL (ref 0.1–1.0)
Monocytes Relative: 5.6 % (ref 3.0–12.0)
NEUTROS PCT: 81.9 % — AB (ref 43.0–77.0)
Neutro Abs: 7 10*3/uL (ref 1.4–7.7)
Platelets: 274 10*3/uL (ref 150.0–400.0)
RBC: 4.7 Mil/uL (ref 3.87–5.11)
RDW: 15.4 % (ref 11.5–15.5)
WBC: 8.6 10*3/uL (ref 4.0–10.5)

## 2015-05-01 LAB — HEPATIC FUNCTION PANEL
ALBUMIN: 3.8 g/dL (ref 3.5–5.2)
ALK PHOS: 77 U/L (ref 39–117)
ALT: 23 U/L (ref 0–35)
AST: 34 U/L (ref 0–37)
Bilirubin, Direct: 0 mg/dL (ref 0.0–0.3)
TOTAL PROTEIN: 6.7 g/dL (ref 6.0–8.3)
Total Bilirubin: 0.3 mg/dL (ref 0.2–1.2)

## 2015-05-01 LAB — BASIC METABOLIC PANEL
BUN: 20 mg/dL (ref 6–23)
CHLORIDE: 104 meq/L (ref 96–112)
CO2: 28 meq/L (ref 19–32)
Calcium: 10.2 mg/dL (ref 8.4–10.5)
Creatinine, Ser: 0.68 mg/dL (ref 0.40–1.20)
GFR: 105.65 mL/min (ref >60.00)
GLUCOSE: 101 mg/dL — AB (ref 70–99)
Potassium: 4.5 mEq/L (ref 3.5–5.1)
SODIUM: 139 meq/L (ref 135–145)

## 2015-05-01 MED ORDER — LOSARTAN POTASSIUM-HCTZ 100-12.5 MG PO TABS: 1.0000 | ORAL_TABLET | Freq: Every day | ORAL | Status: DC

## 2015-05-01 MED ORDER — TRAMADOL HCL 50 MG PO TABS: 50.0000 mg | ORAL_TABLET | Freq: Three times a day (TID) | ORAL | Status: DC

## 2015-05-01 MED ORDER — MONTELUKAST SODIUM 10 MG PO TABS: 10.0000 mg | ORAL_TABLET | Freq: Every day | ORAL | Status: DC

## 2015-05-01 MED ORDER — ATORVASTATIN CALCIUM 10 MG PO TABS: 10.0000 mg | ORAL_TABLET | Freq: Every day | ORAL | Status: DC

## 2015-05-01 NOTE — Patient Instructions (Signed)
Schedule your complete physical in 6 months with your new doctor (Dr Etter Sjogren or Dr Charlett Blake) [facility] notify you of your lab results and make any changes if needed Restart the Lipitor nightly Call with any questions or concerns Welcome (officially) to [facility]! Happy Holidays!!!

## 2015-05-01 NOTE — Telephone Encounter (Signed)
Pt moved back to [place] from [place]. Pt has [employer] patient assistance. Pt will call today and have account updated to our office.   [employer] phone#: [phone] PT ID# [phone] Pt only uses them for Celebrex   Pt uses RX Outreach Program (listed under Pharmacies) for:  Losartan/HCTZ Montelukast Atorvastatin Tramadol  Needs 90 day prescriptions when submitted.

## 2015-05-01 NOTE — Assessment & Plan Note (Signed)
Chronic problem.  Well controlled on current meds.  Asymptomatic at this time.  Check labs.  No anticipated med changes.

## 2015-05-01 NOTE — Progress Notes (Signed)
   Subjective:    Patient ID: Katherine Owen, female    DOB: 03-30-1928, 81 y.o.   MRN: [id number]  HPI HTN- chronic problem, well controlled on Losartan HCTZ.  No CP, SOB, HAs, visual changes, edema.  Hyperlipidemia- chronic problem, currently holding Lipitor at request of MD in [place].  Has been off meds x1 month.  Pt has lost 10 lbs since last visit due to stress of moving.  Now eating regularly again.  Pt does not report significant changes in her pain or functional status since holding Lipitor.  Denies abd pain, N/V.   Review of Systems For ROS see HPI     Objective:   Physical Exam  Constitutional: She is oriented to person, place, and time. She appears well-developed and well-nourished. No distress.  Sitting in wheelchair  HENT:  Head: Normocephalic and atraumatic.  Eyes: Conjunctivae and EOM are normal. Pupils are equal, round, and reactive to light.  Neck: Normal range of motion. Neck supple. No thyromegaly present.  Cardiovascular: Normal rate, regular rhythm, normal heart sounds and intact distal pulses.   No murmur heard. Pulmonary/Chest: Effort normal and breath sounds normal. No respiratory distress.  Abdominal: Soft. She exhibits no distension. There is no tenderness.  Musculoskeletal: She exhibits no edema.  Lymphadenopathy:    She has no cervical adenopathy.  Neurological: She is alert and oriented to person, place, and time.  Skin: Skin is warm and dry.  Psychiatric: She has a normal mood and affect. Her behavior is normal.  Vitals reviewed.         Assessment & Plan:

## 2015-05-01 NOTE — Telephone Encounter (Signed)
Patient states she gave Janett Billow the wrong number this morning for [employer]. The correct number is [phone]

## 2015-05-01 NOTE — Assessment & Plan Note (Signed)
Chronic problem.  Pt has been off statin x1 month.  She has not noticed any improvement in her pain or functional status.  Based on this, will restart Lipitor daily.  Pt expressed understanding and is in agreement w/ plan.

## 2015-05-01 NOTE — Progress Notes (Signed)
Pre visit review using our clinic review tool, if applicable. No additional management support is needed unless otherwise documented below in the visit note. 

## 2015-05-02 NOTE — Telephone Encounter (Signed)
Per pt this is the correct phone number for [employer]: Patient states she gave Janett Billow the wrong number this morning for [employer]. The correct number is [phone]

## 2015-05-02 NOTE — Telephone Encounter (Signed)
Noted  

## 2015-05-03 NOTE — Telephone Encounter (Signed)
Pt states that [facility] phone # is [phone] Pt states they are faxing [place] RX form for Losartan, Montelukast, and Atorvastatin. Pt states we need to call them to expedite order for tramadol 50mg  using pharmacy id# [phone].

## 2015-05-04 NOTE — Telephone Encounter (Signed)
Called and left a message with pharmacist. Will call back again tomorrow.

## 2015-05-05 NOTE — Telephone Encounter (Signed)
Called and spoke with Bonnita Nasuti, they advised that order was processed and they will be contacting pt to receive the $21 payment.

## 2015-05-09 ENCOUNTER — Telehealth: Payer: Self-pay | Admitting: Family Medicine

## 2015-05-09 NOTE — Telephone Encounter (Signed)
Called pt and advised that per PCP she is to be on the elevated losartan dosage. Pt expressed an understanding and agreed with treatment.

## 2015-05-09 NOTE — Telephone Encounter (Signed)
Caller name: Self   Can be reached: [phone]   Reason for call: Patient confused about medication dosage. Received losartan-hydrochlorothiazide (HYZAAR) 100-12.5 MG tablet today but was only taking 50-12.5. Does she take the new dosage or remain on the old dosage. Pls adv.

## 2015-05-25 DIAGNOSIS — M6281 Muscle weakness (generalized): Secondary | ICD-10-CM | POA: Diagnosis not present

## 2015-05-25 DIAGNOSIS — M5105 Intervertebral disc disorders with myelopathy, thoracolumbar region: Secondary | ICD-10-CM | POA: Diagnosis not present

## 2015-05-25 DIAGNOSIS — R2689 Other abnormalities of gait and mobility: Secondary | ICD-10-CM | POA: Diagnosis not present

## 2015-05-25 DIAGNOSIS — R531 Weakness: Secondary | ICD-10-CM | POA: Diagnosis not present

## 2015-05-26 ENCOUNTER — Other Ambulatory Visit: Payer: Self-pay | Admitting: Family Medicine

## 2015-05-26 NOTE — Telephone Encounter (Signed)
Relation to [id number] Call back number: [phone]  Pharmacy: [facility]    Reason for call:  Patient requesting refill celecoxib (CELEBREX) 200 MG capsule and would like medication request to go thru [employer]. Patient stated medication is ordered and then delivered to office and thenpatient is notified.

## 2015-05-29 NOTE — Telephone Encounter (Signed)
Kenneth for patient, after being on hold for 20 minutes I discontinued call. Will trying back again tomorrow

## 2015-05-30 NOTE — Telephone Encounter (Signed)
Order completed and order confirmation [id number]

## 2015-06-09 DIAGNOSIS — M6281 Muscle weakness (generalized): Secondary | ICD-10-CM | POA: Diagnosis not present

## 2015-06-09 DIAGNOSIS — R2689 Other abnormalities of gait and mobility: Secondary | ICD-10-CM | POA: Diagnosis not present

## 2015-06-09 DIAGNOSIS — M5105 Intervertebral disc disorders with myelopathy, thoracolumbar region: Secondary | ICD-10-CM | POA: Diagnosis not present

## 2015-06-09 DIAGNOSIS — R531 Weakness: Secondary | ICD-10-CM | POA: Diagnosis not present

## 2015-06-21 DIAGNOSIS — M5105 Intervertebral disc disorders with myelopathy, thoracolumbar region: Secondary | ICD-10-CM | POA: Diagnosis not present

## 2015-06-21 DIAGNOSIS — R531 Weakness: Secondary | ICD-10-CM | POA: Diagnosis not present

## 2015-06-21 DIAGNOSIS — R2689 Other abnormalities of gait and mobility: Secondary | ICD-10-CM | POA: Diagnosis not present

## 2015-06-21 DIAGNOSIS — M6281 Muscle weakness (generalized): Secondary | ICD-10-CM | POA: Diagnosis not present

## 2015-06-29 DIAGNOSIS — M5105 Intervertebral disc disorders with myelopathy, thoracolumbar region: Secondary | ICD-10-CM | POA: Diagnosis not present

## 2015-06-29 DIAGNOSIS — R2689 Other abnormalities of gait and mobility: Secondary | ICD-10-CM | POA: Diagnosis not present

## 2015-06-29 DIAGNOSIS — R531 Weakness: Secondary | ICD-10-CM | POA: Diagnosis not present

## 2015-06-29 DIAGNOSIS — M6281 Muscle weakness (generalized): Secondary | ICD-10-CM | POA: Diagnosis not present

## 2015-07-10 DIAGNOSIS — R531 Weakness: Secondary | ICD-10-CM | POA: Diagnosis not present

## 2015-07-10 DIAGNOSIS — R2689 Other abnormalities of gait and mobility: Secondary | ICD-10-CM | POA: Diagnosis not present

## 2015-07-10 DIAGNOSIS — M5105 Intervertebral disc disorders with myelopathy, thoracolumbar region: Secondary | ICD-10-CM | POA: Diagnosis not present

## 2015-07-10 DIAGNOSIS — M6281 Muscle weakness (generalized): Secondary | ICD-10-CM | POA: Diagnosis not present

## 2015-07-17 DIAGNOSIS — M5105 Intervertebral disc disorders with myelopathy, thoracolumbar region: Secondary | ICD-10-CM | POA: Diagnosis not present

## 2015-07-17 DIAGNOSIS — R531 Weakness: Secondary | ICD-10-CM | POA: Diagnosis not present

## 2015-07-17 DIAGNOSIS — R2689 Other abnormalities of gait and mobility: Secondary | ICD-10-CM | POA: Diagnosis not present

## 2015-07-17 DIAGNOSIS — M6281 Muscle weakness (generalized): Secondary | ICD-10-CM | POA: Diagnosis not present

## 2015-07-24 DIAGNOSIS — R2689 Other abnormalities of gait and mobility: Secondary | ICD-10-CM | POA: Diagnosis not present

## 2015-07-24 DIAGNOSIS — M6281 Muscle weakness (generalized): Secondary | ICD-10-CM | POA: Diagnosis not present

## 2015-07-24 DIAGNOSIS — M5105 Intervertebral disc disorders with myelopathy, thoracolumbar region: Secondary | ICD-10-CM | POA: Diagnosis not present

## 2015-07-31 DIAGNOSIS — M5105 Intervertebral disc disorders with myelopathy, thoracolumbar region: Secondary | ICD-10-CM | POA: Diagnosis not present

## 2015-07-31 DIAGNOSIS — M6281 Muscle weakness (generalized): Secondary | ICD-10-CM | POA: Diagnosis not present

## 2015-07-31 DIAGNOSIS — R2689 Other abnormalities of gait and mobility: Secondary | ICD-10-CM | POA: Diagnosis not present

## 2015-08-01 ENCOUNTER — Other Ambulatory Visit: Payer: Self-pay

## 2015-08-01 ENCOUNTER — Telehealth: Payer: Self-pay | Admitting: Family Medicine

## 2015-08-01 NOTE — Telephone Encounter (Signed)
Sent request to Dr. Birdie Riddle for her approval/disapproval of refill on Tramodol. Left for the day will acknowledge tomorrow  Patient notified and agreed.

## 2015-08-01 NOTE — Telephone Encounter (Signed)
Pt called for refill on tramadol. She needs it thru [employer] order?

## 2015-08-02 MED ORDER — TRAMADOL HCL 50 MG PO TABS: 50.0000 mg | ORAL_TABLET | Freq: Three times a day (TID) | ORAL | Status: DC

## 2015-08-03 NOTE — Telephone Encounter (Signed)
RX printed signed and sent to pharmacy.

## 2015-08-10 DIAGNOSIS — M6281 Muscle weakness (generalized): Secondary | ICD-10-CM | POA: Diagnosis not present

## 2015-08-10 DIAGNOSIS — M5105 Intervertebral disc disorders with myelopathy, thoracolumbar region: Secondary | ICD-10-CM | POA: Diagnosis not present

## 2015-08-10 DIAGNOSIS — R2689 Other abnormalities of gait and mobility: Secondary | ICD-10-CM | POA: Diagnosis not present

## 2015-08-15 DIAGNOSIS — R2689 Other abnormalities of gait and mobility: Secondary | ICD-10-CM | POA: Diagnosis not present

## 2015-08-15 DIAGNOSIS — M6281 Muscle weakness (generalized): Secondary | ICD-10-CM | POA: Diagnosis not present

## 2015-08-15 DIAGNOSIS — M5105 Intervertebral disc disorders with myelopathy, thoracolumbar region: Secondary | ICD-10-CM | POA: Diagnosis not present

## 2015-08-21 ENCOUNTER — Telehealth: Payer: Self-pay | Admitting: Family Medicine

## 2015-08-21 DIAGNOSIS — M48061 Spinal stenosis, lumbar region without neurogenic claudication: Secondary | ICD-10-CM

## 2015-08-21 NOTE — Telephone Encounter (Signed)
Pt is interested in getting a wheelchair to use for inside her home. She states she contacted medicare and was told she needed an RX and to use a home medical supply company. Pt wondering if Dr. Birdie Riddle can write RX and recommend where to get wheelchair from. Home # [phone].

## 2015-08-21 NOTE — Telephone Encounter (Signed)
Ok for order for wheelchair ?

## 2015-08-21 NOTE — Telephone Encounter (Signed)
DME order for a wheelchair placed in our system and [place] was notified.

## 2015-08-22 ENCOUNTER — Telehealth: Payer: Self-pay | Admitting: Family Medicine

## 2015-08-22 NOTE — Telephone Encounter (Signed)
Talked to pt. She will call this afternoon when her son is there as he has to bring her for appts (needs 30 min)

## 2015-08-22 NOTE — Telephone Encounter (Signed)
-----   Message from Davis Gourd, [place] sent at 08/21/2015  4:55 PM EST ----- Regarding: FW: DME for a Wheelchair Can you please call pt and see if we can schedule in an 11 am spot?   Jess ----- Message -----    From: Darlina Guys    Sent: 08/21/2015   4:42 PM      To: Davis Gourd, CMA Subject: RE: DME for a Wheelchair                       Hey Janett Billow. This pt has Medicare and they require that the pt have a face to face office visit within 90 days prior to the DME order.  Can y'all get her in for a visit to discuss her need for the wheelchair? ----- Message -----    From: Davis Gourd, CMA    Sent: 08/21/2015   4:34 PM      To: Melissa Stenson Subject: DME for a Wheelchair                           Melissa,   Just wanted to let you know that Dr. Birdie Riddle had me place a DME for a wheelchair for this patient. There is also a phone noted in the system  Desmond Dike, [place]

## 2015-08-23 DIAGNOSIS — R2689 Other abnormalities of gait and mobility: Secondary | ICD-10-CM | POA: Diagnosis not present

## 2015-08-23 DIAGNOSIS — M5105 Intervertebral disc disorders with myelopathy, thoracolumbar region: Secondary | ICD-10-CM | POA: Diagnosis not present

## 2015-08-23 DIAGNOSIS — M6281 Muscle weakness (generalized): Secondary | ICD-10-CM | POA: Diagnosis not present

## 2015-08-28 ENCOUNTER — Encounter: Payer: Self-pay | Admitting: Family Medicine

## 2015-08-28 ENCOUNTER — Ambulatory Visit: Payer: Medicare Other | Admitting: Family Medicine

## 2015-08-28 ENCOUNTER — Ambulatory Visit (INDEPENDENT_AMBULATORY_CARE_PROVIDER_SITE_OTHER): Payer: Medicare Other | Admitting: Family Medicine

## 2015-08-28 VITALS — BP 124/76 | HR 76 | Temp 98.0°F | Resp 17 | Ht 65.0 in | Wt 149.0 lb

## 2015-08-28 DIAGNOSIS — Z7409 Other reduced mobility: Secondary | ICD-10-CM

## 2015-08-28 DIAGNOSIS — M4806 Spinal stenosis, lumbar region: Secondary | ICD-10-CM | POA: Diagnosis not present

## 2015-08-28 DIAGNOSIS — M48061 Spinal stenosis, lumbar region without neurogenic claudication: Secondary | ICD-10-CM

## 2015-08-28 HISTORY — DX: Other reduced mobility: Z74.09

## 2015-08-28 NOTE — Assessment & Plan Note (Addendum)
Ongoing issue.  It is because of this and her residual leg weakness that she now requires a wheelchair (see above).  DME order already completed.  Will refer to home health for wheelchair training.  Total time spent w/ pt 25 minutes, >30% spent counseling regarding home needs and safety

## 2015-08-28 NOTE — Progress Notes (Signed)
Pre visit review using our clinic review tool, if applicable. No additional management support is needed unless otherwise documented below in the visit note. 

## 2015-08-28 NOTE — Progress Notes (Signed)
   Subjective:    Patient ID: Katherine Owen, female    DOB: 1928/05/30, 82 y.o.   MRN: [id number]  HPI Spinal Stenosis w/ lumbar decompression done at [facility] November 2015- pt continues to have weakness and heaviness in R leg making ambulation very difficult, even w/ assistance of walker.  Due to differences in strength between her legs, she feels her balance is very unsteady and this places her at high risk for falling.  Due to her spinal stenosis, which impairs her ability to safely perform daily activities such as food preparation, dressing, grooming in the home.  A walker will not resolve this issue as stated above.  A wheelchair will allow her to safely perform these activities from a seated position.  Furthermore, she is able to safely propel the wheelchair in her home w/ her sufficient upper body strength and also has family locally (around the corner) who are able to provide assistance if needed.  Pt currently has to exit her home from the back door and it takes 2 people to get her down the 3 steps.  Family is asking about possiblity of getting a ramp.  Pt is currently living alone and would benefit from some personal care services if available.     Review of Systems For ROS see HPI     Objective:   Physical Exam  Constitutional: She is oriented to person, place, and time. She appears well-developed and well-nourished. No distress.  HENT:  Head: Normocephalic and atraumatic.  Eyes: Conjunctivae and EOM are normal. Pupils are equal, round, and reactive to light.  Musculoskeletal: She exhibits no edema.  Upper extremity strength 5/5 and symmetric Pt w/ R LE weakness, 2-3/5  Neurological: She is alert and oriented to person, place, and time. No cranial nerve deficit. Coordination normal.  Skin: Skin is warm and dry.  Psychiatric: She has a normal mood and affect. Her behavior is normal. Thought content normal.  Vitals reviewed.         Assessment & Plan:

## 2015-08-28 NOTE — Assessment & Plan Note (Signed)
Pt has had difficulty w/ ambulation, balance, and mobility since her lumbar decompression done November 2015.  She is not able to safely get in and out of her home due to the fact there are 3 steps and her family must drive through the back yard to get her out of the house.  She will do better in a wheelchair both in and out of the house but we must also correct the problem w/ entering and exiting the home.  Will refer to home health for SW assistance to put them in touch w/ community resources.  Pt will also get wheelchair to help her mobility in the home and out of the home.  Will follow.

## 2015-08-28 NOTE — Patient Instructions (Signed)
Follow up as scheduled We'll be in touch regarding the home health services and attempt to get everything we can to help you! Call with any questions or concerns If you want to join [place] at the new [place] office, any scheduled appointments will automatically transfer and we will see you at [address] [place] [address] [place], [place] [address] ([place]) [place]!!!

## 2015-08-29 DIAGNOSIS — M5105 Intervertebral disc disorders with myelopathy, thoracolumbar region: Secondary | ICD-10-CM | POA: Diagnosis not present

## 2015-08-29 DIAGNOSIS — M6281 Muscle weakness (generalized): Secondary | ICD-10-CM | POA: Diagnosis not present

## 2015-08-29 DIAGNOSIS — R2689 Other abnormalities of gait and mobility: Secondary | ICD-10-CM | POA: Diagnosis not present

## 2015-09-04 DIAGNOSIS — M4806 Spinal stenosis, lumbar region: Secondary | ICD-10-CM | POA: Diagnosis not present

## 2015-09-04 DIAGNOSIS — M6281 Muscle weakness (generalized): Secondary | ICD-10-CM | POA: Diagnosis not present

## 2015-09-05 ENCOUNTER — Telehealth: Payer: Self-pay | Admitting: *Deleted

## 2015-09-05 NOTE — Telephone Encounter (Signed)
Received Client Coordination Note Report; forwarded to provider/SLS 03/14

## 2015-09-06 DIAGNOSIS — M4806 Spinal stenosis, lumbar region: Secondary | ICD-10-CM | POA: Diagnosis not present

## 2015-09-06 DIAGNOSIS — M6281 Muscle weakness (generalized): Secondary | ICD-10-CM | POA: Diagnosis not present

## 2015-09-07 ENCOUNTER — Telehealth: Payer: Self-pay | Admitting: Family Medicine

## 2015-09-07 NOTE — Telephone Encounter (Signed)
Relation to [id number] Call back number:[phone]   Reason for call:  Patient last seen 08/28/2015 inquiring about the status of ramp request.

## 2015-09-08 DIAGNOSIS — M4806 Spinal stenosis, lumbar region: Secondary | ICD-10-CM | POA: Diagnosis not present

## 2015-09-08 DIAGNOSIS — M6281 Muscle weakness (generalized): Secondary | ICD-10-CM | POA: Diagnosis not present

## 2015-09-08 NOTE — Telephone Encounter (Signed)
Unfortunately we can't build a ramp for her.  All we can do is set her up w/ home health social work to see if there are any available resources

## 2015-09-08 NOTE — Telephone Encounter (Signed)
Called and advised pt that she would have to speak with Home Health to find out if they know of any companies that could possibly build her a ramp. Pt stated an understanding.

## 2015-09-12 DIAGNOSIS — M4806 Spinal stenosis, lumbar region: Secondary | ICD-10-CM | POA: Diagnosis not present

## 2015-09-12 DIAGNOSIS — M6281 Muscle weakness (generalized): Secondary | ICD-10-CM | POA: Diagnosis not present

## 2015-09-14 DIAGNOSIS — M4806 Spinal stenosis, lumbar region: Secondary | ICD-10-CM | POA: Diagnosis not present

## 2015-09-14 DIAGNOSIS — M6281 Muscle weakness (generalized): Secondary | ICD-10-CM | POA: Diagnosis not present

## 2015-09-15 DIAGNOSIS — M6281 Muscle weakness (generalized): Secondary | ICD-10-CM | POA: Diagnosis not present

## 2015-09-15 DIAGNOSIS — M4806 Spinal stenosis, lumbar region: Secondary | ICD-10-CM | POA: Diagnosis not present

## 2015-09-18 DIAGNOSIS — I10 Essential (primary) hypertension: Secondary | ICD-10-CM | POA: Diagnosis not present

## 2015-09-18 DIAGNOSIS — M4806 Spinal stenosis, lumbar region: Secondary | ICD-10-CM | POA: Diagnosis not present

## 2015-09-18 DIAGNOSIS — M6281 Muscle weakness (generalized): Secondary | ICD-10-CM | POA: Diagnosis not present

## 2015-09-19 DIAGNOSIS — M6281 Muscle weakness (generalized): Secondary | ICD-10-CM | POA: Diagnosis not present

## 2015-09-19 DIAGNOSIS — M4806 Spinal stenosis, lumbar region: Secondary | ICD-10-CM | POA: Diagnosis not present

## 2015-09-20 DIAGNOSIS — M6281 Muscle weakness (generalized): Secondary | ICD-10-CM | POA: Diagnosis not present

## 2015-09-20 DIAGNOSIS — M4806 Spinal stenosis, lumbar region: Secondary | ICD-10-CM | POA: Diagnosis not present

## 2015-09-20 NOTE — Telephone Encounter (Signed)
Please close encounter

## 2015-09-21 DIAGNOSIS — M6281 Muscle weakness (generalized): Secondary | ICD-10-CM | POA: Diagnosis not present

## 2015-09-21 DIAGNOSIS — M4806 Spinal stenosis, lumbar region: Secondary | ICD-10-CM | POA: Diagnosis not present

## 2015-09-22 DIAGNOSIS — M6281 Muscle weakness (generalized): Secondary | ICD-10-CM | POA: Diagnosis not present

## 2015-09-22 DIAGNOSIS — M4806 Spinal stenosis, lumbar region: Secondary | ICD-10-CM | POA: Diagnosis not present

## 2015-09-25 ENCOUNTER — Telehealth: Payer: Self-pay | Admitting: Family Medicine

## 2015-09-25 MED ORDER — CELECOXIB 200 MG PO CAPS: 200.0000 mg | ORAL_CAPSULE | Freq: Every day | ORAL | Status: DC

## 2015-09-25 NOTE — Telephone Encounter (Signed)
Pt needs refill on celebrex and wants to p/u in [facility].

## 2015-09-25 NOTE — Telephone Encounter (Signed)
Called and ordered medication through [employer] patient assistance (Conf#[id number]) and pt's celebrex will be delivered to the [facility] location as she requested, pt wants to pick up there since it is closer to her home. Marland Kitchen Box will be made out in Dr. Virgil Benedict name.

## 2015-09-26 ENCOUNTER — Telehealth: Payer: Self-pay | Admitting: *Deleted

## 2015-09-26 DIAGNOSIS — M6281 Muscle weakness (generalized): Secondary | ICD-10-CM | POA: Diagnosis not present

## 2015-09-26 DIAGNOSIS — M4806 Spinal stenosis, lumbar region: Secondary | ICD-10-CM | POA: Diagnosis not present

## 2015-09-26 NOTE — Telephone Encounter (Signed)
Received Physician VO; forwarded to provider via fax [[facility]] with charge sheet/SLS 04/04

## 2015-09-28 DIAGNOSIS — M6281 Muscle weakness (generalized): Secondary | ICD-10-CM | POA: Diagnosis not present

## 2015-09-28 DIAGNOSIS — M4806 Spinal stenosis, lumbar region: Secondary | ICD-10-CM | POA: Diagnosis not present

## 2015-09-29 DIAGNOSIS — M6281 Muscle weakness (generalized): Secondary | ICD-10-CM | POA: Diagnosis not present

## 2015-09-29 DIAGNOSIS — M4806 Spinal stenosis, lumbar region: Secondary | ICD-10-CM | POA: Diagnosis not present

## 2015-10-03 DIAGNOSIS — M6281 Muscle weakness (generalized): Secondary | ICD-10-CM | POA: Diagnosis not present

## 2015-10-03 DIAGNOSIS — M4806 Spinal stenosis, lumbar region: Secondary | ICD-10-CM | POA: Diagnosis not present

## 2015-10-03 NOTE — Telephone Encounter (Signed)
Pt called and states daughter Diondra Haar will p/u meds

## 2015-10-04 DIAGNOSIS — M4806 Spinal stenosis, lumbar region: Secondary | ICD-10-CM | POA: Diagnosis not present

## 2015-10-04 DIAGNOSIS — M6281 Muscle weakness (generalized): Secondary | ICD-10-CM | POA: Diagnosis not present

## 2015-10-05 DIAGNOSIS — M6281 Muscle weakness (generalized): Secondary | ICD-10-CM | POA: Diagnosis not present

## 2015-10-05 DIAGNOSIS — M4806 Spinal stenosis, lumbar region: Secondary | ICD-10-CM | POA: Diagnosis not present

## 2015-10-06 DIAGNOSIS — M4806 Spinal stenosis, lumbar region: Secondary | ICD-10-CM | POA: Diagnosis not present

## 2015-10-06 DIAGNOSIS — M6281 Muscle weakness (generalized): Secondary | ICD-10-CM | POA: Diagnosis not present

## 2015-10-09 ENCOUNTER — Telehealth: Payer: Self-pay | Admitting: General Practice

## 2015-10-09 NOTE — Telephone Encounter (Signed)
Paperwork completed by PCP and faxed back.

## 2015-10-09 NOTE — Telephone Encounter (Signed)
Error

## 2015-10-10 DIAGNOSIS — M4806 Spinal stenosis, lumbar region: Secondary | ICD-10-CM | POA: Diagnosis not present

## 2015-10-10 DIAGNOSIS — M6281 Muscle weakness (generalized): Secondary | ICD-10-CM | POA: Diagnosis not present

## 2015-10-11 DIAGNOSIS — M4806 Spinal stenosis, lumbar region: Secondary | ICD-10-CM | POA: Diagnosis not present

## 2015-10-11 DIAGNOSIS — M6281 Muscle weakness (generalized): Secondary | ICD-10-CM | POA: Diagnosis not present

## 2015-10-13 DIAGNOSIS — M4806 Spinal stenosis, lumbar region: Secondary | ICD-10-CM | POA: Diagnosis not present

## 2015-10-13 DIAGNOSIS — M6281 Muscle weakness (generalized): Secondary | ICD-10-CM | POA: Diagnosis not present

## 2015-10-17 ENCOUNTER — Telehealth: Payer: Self-pay | Admitting: Family Medicine

## 2015-10-17 DIAGNOSIS — M4806 Spinal stenosis, lumbar region: Secondary | ICD-10-CM | POA: Diagnosis not present

## 2015-10-17 DIAGNOSIS — M6281 Muscle weakness (generalized): Secondary | ICD-10-CM | POA: Diagnosis not present

## 2015-10-17 NOTE — Telephone Encounter (Signed)
Pt asking for a referral/order for home health to come in and assist with house cleaning, some meals, and bathing. Pt states that since she has not seen [place] yet that they trans her call to [place] to take care of this.

## 2015-10-18 NOTE — Telephone Encounter (Signed)
Then pt needs an appointment to see Dr. Birdie Riddle this paperwork cannot be completed without a face-to-face appt.

## 2015-10-18 NOTE — Telephone Encounter (Signed)
Spoke to pt and made aware that an appt was needed. She states that she will call [place] office and schedule an appt.

## 2015-10-19 DIAGNOSIS — M6281 Muscle weakness (generalized): Secondary | ICD-10-CM | POA: Diagnosis not present

## 2015-10-19 DIAGNOSIS — M4806 Spinal stenosis, lumbar region: Secondary | ICD-10-CM | POA: Diagnosis not present

## 2015-10-20 DIAGNOSIS — M6281 Muscle weakness (generalized): Secondary | ICD-10-CM | POA: Diagnosis not present

## 2015-10-20 DIAGNOSIS — M4806 Spinal stenosis, lumbar region: Secondary | ICD-10-CM | POA: Diagnosis not present

## 2015-10-24 ENCOUNTER — Ambulatory Visit: Payer: Medicare Other | Admitting: Family Medicine

## 2015-10-24 ENCOUNTER — Ambulatory Visit (HOSPITAL_BASED_OUTPATIENT_CLINIC_OR_DEPARTMENT_OTHER)
Admission: RE | Admit: 2015-10-24 | Discharge: 2015-10-24 | Disposition: A | Discharge status: Home / Self Care | Payer: Medicare Other | Source: Ambulatory Visit | Attending: Family Medicine | Admitting: Family Medicine

## 2015-10-24 ENCOUNTER — Ambulatory Visit (INDEPENDENT_AMBULATORY_CARE_PROVIDER_SITE_OTHER): Payer: Medicare Other | Admitting: Family Medicine

## 2015-10-24 ENCOUNTER — Encounter: Payer: Self-pay | Admitting: Family Medicine

## 2015-10-24 ENCOUNTER — Encounter: Payer: Medicare Other | Admitting: Family Medicine

## 2015-10-24 VITALS — BP 144/76 | HR 94 | Temp 97.6°F | Ht 65.0 in | Wt 141.6 lb

## 2015-10-24 DIAGNOSIS — E785 Hyperlipidemia, unspecified: Secondary | ICD-10-CM | POA: Diagnosis not present

## 2015-10-24 DIAGNOSIS — M25511 Pain in right shoulder: Secondary | ICD-10-CM | POA: Diagnosis not present

## 2015-10-24 DIAGNOSIS — M48061 Spinal stenosis, lumbar region without neurogenic claudication: Secondary | ICD-10-CM

## 2015-10-24 DIAGNOSIS — M810 Age-related osteoporosis without current pathological fracture: Secondary | ICD-10-CM | POA: Insufficient documentation

## 2015-10-24 DIAGNOSIS — I10 Essential (primary) hypertension: Secondary | ICD-10-CM | POA: Diagnosis not present

## 2015-10-24 DIAGNOSIS — M4806 Spinal stenosis, lumbar region: Secondary | ICD-10-CM | POA: Diagnosis not present

## 2015-10-24 DIAGNOSIS — M89711 Major osseous defect, right shoulder region: Secondary | ICD-10-CM | POA: Insufficient documentation

## 2015-10-24 DIAGNOSIS — M19011 Primary osteoarthritis, right shoulder: Secondary | ICD-10-CM | POA: Diagnosis not present

## 2015-10-24 DIAGNOSIS — J302 Other seasonal allergic rhinitis: Secondary | ICD-10-CM | POA: Diagnosis not present

## 2015-10-24 DIAGNOSIS — M6281 Muscle weakness (generalized): Secondary | ICD-10-CM | POA: Diagnosis not present

## 2015-10-24 LAB — COMPREHENSIVE METABOLIC PANEL
ALBUMIN: 3.8 g/dL (ref 3.5–5.2)
ALT: 20 U/L (ref 0–35)
AST: 29 U/L (ref 0–37)
Alkaline Phosphatase: 73 U/L (ref 39–117)
BUN: 19 mg/dL (ref 6–23)
CO2: 31 mEq/L (ref 19–32)
Calcium: 10.3 mg/dL (ref 8.4–10.5)
Chloride: 104 mEq/L (ref 96–112)
Creatinine, Ser: 0.57 mg/dL (ref 0.40–1.20)
GFR: 129.36 mL/min (ref >60.00)
Glucose, Bld: 92 mg/dL (ref 70–99)
Potassium: 4.5 mEq/L (ref 3.5–5.1)
SODIUM: 139 meq/L (ref 135–145)
TOTAL PROTEIN: 6.7 g/dL (ref 6.0–8.3)
Total Bilirubin: 0.3 mg/dL (ref 0.2–1.2)

## 2015-10-24 LAB — LIPID PANEL
Cholesterol: 162 mg/dL (ref 0–200)
HDL: 52.2 mg/dL (ref >39.00)
LDL Cholesterol: 89 mg/dL (ref 0–99)
NonHDL: 109.76
Total CHOL/HDL Ratio: 3
Triglycerides: 104 mg/dL (ref 0.0–149.0)
VLDL: 20.8 mg/dL (ref 0.0–40.0)

## 2015-10-24 MED ORDER — LOSARTAN POTASSIUM-HCTZ 100-12.5 MG PO TABS: 1.0000 | ORAL_TABLET | Freq: Every day | ORAL | Status: DC

## 2015-10-24 MED ORDER — ATORVASTATIN CALCIUM 10 MG PO TABS: 10.0000 mg | ORAL_TABLET | Freq: Every day | ORAL | Status: DC

## 2015-10-24 MED ORDER — MONTELUKAST SODIUM 10 MG PO TABS: 10.0000 mg | ORAL_TABLET | Freq: Every day | ORAL | Status: DC

## 2015-10-24 NOTE — Assessment & Plan Note (Signed)
Pt in a wheelchair majority of the day Uses walker very little Family is requesting help for ADLs at home because she lives by herself

## 2015-10-24 NOTE — Patient Instructions (Signed)

## 2015-10-24 NOTE — Assessment & Plan Note (Signed)
Stable con't meds 

## 2015-10-24 NOTE — Progress Notes (Signed)
Pre visit review using our clinic review tool, if applicable. No additional management support is needed unless otherwise documented below in the visit note. 

## 2015-10-24 NOTE — Progress Notes (Signed)
Patient ID: Katherine Owen, female    DOB: 07/31/1927  Age: 82 y.o. MRN: [id number]    Subjective:  Subjective HPI [name] presents for face to face for eval for a home aid to help cooking , cleaning , personal hygiene.   Pt lives alone.   Son and daughter live close by and check on her almost every day.   They need help with her because it is getting more difficult to care for her.  Pt is able to get into bed on her own and is able to go to bathroom but needs help with bathing etc.  Aid now comes 2x a week to help with bathing only-- she needs more time.  Pt has no control over R leg and weakness due to spinal stenosis    Review of Systems  Constitutional: Negative for diaphoresis, appetite change, fatigue and unexpected weight change.  Eyes: Negative for pain, redness and visual disturbance.  Respiratory: Negative for cough, chest tightness, shortness of breath and wheezing.   Cardiovascular: Negative for chest pain, palpitations and leg swelling.  Endocrine: Negative for cold intolerance, heat intolerance, polydipsia, polyphagia and polyuria.  Genitourinary: Negative for dysuria, frequency and difficulty urinating.  Neurological: Positive for weakness. Negative for dizziness, light-headedness, numbness and headaches.    History Past Medical History  Diagnosis Date  . Hyperlipidemia   . Hypertension   . Allergy   . Arthritis   . Cataract     Bilateral  . History of shingles     She has past surgical history that includes Joint replacement (2003, 2009); Ankle surgery (Left, 2014); Colonoscopy; Eye surgery; Spine surgery; Back surgery (2015); and Total hip arthroplasty (Bilateral, 2005, 2009).   Her family history includes Arthritis in her father and mother; Cancer in her mother; Parkinson's disease in her father. There is no history of Anesthesia problems.She reports that she has quit smoking. She has never used smokeless tobacco. She reports that she does not drink alcohol or  use illicit drugs.  Current Outpatient Prescriptions on File Prior to Visit  Medication Sig Dispense Refill  . Aspirin-Acetaminophen-Caffeine (EXCEDRIN EXTRA STRENGTH PO) Take by mouth.    . celecoxib (CELEBREX) 200 MG capsule Take 1 capsule (200 mg total) by mouth daily. 100 capsule 0  . cetirizine (ZYRTEC ALLERGY) 10 MG tablet Take 10 mg by mouth at bedtime.    . Multiple Vitamins-Minerals (CENTRUM SILVER) tablet Take 1 tablet by mouth daily.    Marland Kitchen senna-docusate (SENOKOT-S) 8.6-50 MG per tablet Take 2 tablets by mouth 2 (two) times daily. For constipation    . traMADol (ULTRAM) 50 MG tablet Take 1 tablet (50 mg total) by mouth 3 (three) times daily. 270 tablet 0   No current facility-administered medications on file prior to visit.     Objective:  Objective Physical Exam  Constitutional: She is oriented to person, place, and time. She appears well-developed and well-nourished.  HENT:  Head: Normocephalic and atraumatic.  Eyes: Conjunctivae and EOM are normal.  Neck: Normal range of motion. Neck supple. No JVD present. Carotid bruit is not present. No thyromegaly present.  Cardiovascular: Normal rate, regular rhythm and normal heart sounds.   No murmur heard. Pulmonary/Chest: Effort normal and breath sounds normal. No respiratory distress. She has no wheezes. She has no rales. She exhibits no tenderness.  Musculoskeletal: She exhibits tenderness. She exhibits no edema.       Right shoulder: She exhibits tenderness. She exhibits normal range of motion.  Right upper leg: She exhibits tenderness. She exhibits no swelling and no edema.  Neurological: She is alert and oriented to person, place, and time. Coordination and gait abnormal.  Weakness both lower ext R >L Pt unable to stand without holding onto something and only for few seconds Pt in wheelchair almost entire day  Psychiatric: She has a normal mood and affect.  Nursing note and vitals reviewed.  BP 144/76 mmHg  Pulse 94   Temp(Src) 97.6 F (36.4 C) (Oral)  Ht 5\' 5"  (1.651 m)  Wt 141 lb 9.6 oz (64.229 kg)  BMI 23.56 kg/m2  SpO2 96% Wt Readings from Last 3 Encounters:  10/24/15 141 lb 9.6 oz (64.229 kg)  08/28/15 149 lb (67.586 kg)  05/01/15 149 lb (67.586 kg)     Lab Results  Component Value Date   WBC 8.6 05/01/2015   HGB 12.4 05/01/2015   HCT 38.4 05/01/2015   PLT 274.0 05/01/2015   GLUCOSE 92 10/24/2015   CHOL 162 10/24/2015   TRIG 104.0 10/24/2015   HDL 52.20 10/24/2015   LDLCALC 89 10/24/2015   ALT 20 10/24/2015   AST 29 10/24/2015   NA 139 10/24/2015   K 4.5 10/24/2015   CL 104 10/24/2015   CREATININE 0.57 10/24/2015   BUN 19 10/24/2015   CO2 31 10/24/2015   TSH 1.31 10/27/2014    Dg Bone Density  11/22/2014  CLINICAL DATA:  Postmenopausal. Bilateral hip replacements. EXAM: DUAL X-RAY ABSORPTIOMETRY (DXA) FOR BONE MINERAL DENSITY FINDINGS: LEFT FOREARM (1/3 RADIUS) Bone Mineral Density (BMD):  0.554 Young Adult T Score:  -2.3 Z Score:  1.4 ASSESSMENT: Patient's diagnostic category is LOW BONE MASS by WHO Criteria. FRACTURE RISK: MODERATE FRAX: World Health Organization FRAX assessment of absolute fracture risk is not calculated for this patient because the patient has no evaluable hip for DXA evaluation due to bilateral hip replacements. COMPARISON: None. Note: A lumbar spine determination could not be used for the patient due to advanced degenerative changes with sclerosis. Effective therapies are available in the form of bisphosphonates, selective estrogen receptor modulators, biologic agents, and hormone replacement therapy (for women). All patients should ensure an adequate intake of dietary calcium (1200 mg daily) and vitamin D (800 IU daily) unless contraindicated. All treatment decisions require clinical judgment and consideration of individual patient factors, including patient preferences, co-morbidities, previous drug use, risk factors not captured in the FRAX model (e.g., frailty,  falls, vitamin D deficiency, increased bone turnover, interval significant decline in bone density) and possible under- or over-estimation of fracture risk by FRAX. The National Osteoporosis Foundation recommends that FDA-approved medical therapies be considered in postmenopausal women and men age 58 or older with a: 1. Hip or vertebral (clinical or morphometric) fracture. 2. T-score of -2.5 or lower at the spine or hip. 3. Ten-year fracture probability by FRAX of 3% or greater for hip fracture or 20% or greater for major osteoporotic fracture. People with diagnosed cases of osteoporosis or at high risk for fracture should have regular bone mineral density tests. For patients eligible for Medicare, routine testing is allowed once every 2 years. The testing frequency can be increased to one year for patients who have rapidly progressing disease, those who are receiving or discontinuing medical therapy to restore bone mass, or have additional risk factors. World Pharmacologist [facility]) Criteria: Normal: T-scores from +1.0 to -1.0 Low Bone Mass (Osteopenia): T-scores between -1.0 and -2.5 Osteoporosis: T-scores -2.5 and below Comparison to Reference Population: T-score is the key measure  used in the diagnosis of osteoporosis and relative risk determination for fracture. It provides a value for bone mass relative to the mean bone mass of a young adult reference population expressed in terms of standard deviation (SD). Z-score is the age-matched score showing the patient's values compared to a population matched for age, sex, and race. This is also expressed in terms of standard deviation. The patient may have values that compare favorably to the age-matched values and still be at increased risk for fracture. Electronically Signed   By: Claudie Revering M.D.   On: 11/22/2014 11:31   Mm Digital Screening Bilateral  11/30/2014  CLINICAL DATA:  Screening. EXAM: DIGITAL SCREENING BILATERAL MAMMOGRAM WITH CAD COMPARISON:   Previous exam(s). ACR Breast Density Category c: The breast tissue is heterogeneously dense, which may obscure small masses. FINDINGS: There are no findings suspicious for malignancy. Images were processed with CAD. IMPRESSION: No mammographic evidence of malignancy. A result letter of this screening mammogram will be mailed directly to the patient. RECOMMENDATION: Screening mammogram in one year. (Code:SM-B-01Y) BI-RADS CATEGORY  1: Negative. Electronically Signed   By: Altamese Cabal M.D.   On: 11/30/2014 13:55     Assessment & Plan:  Plan I am having Ms. Claybon Jabs maintain her senna-docusate, CENTRUM SILVER, cetirizine, Aspirin-Acetaminophen-Caffeine (EXCEDRIN EXTRA STRENGTH PO), traMADol, celecoxib, losartan-hydrochlorothiazide, atorvastatin, and montelukast.  Meds ordered this encounter  Medications  . losartan-hydrochlorothiazide (HYZAAR) 100-12.5 MG tablet    Sig: Take 1 tablet by mouth at bedtime. For hypertension    Dispense:  90 tablet    Refill:  1  . atorvastatin (LIPITOR) 10 MG tablet    Sig: Take 1 tablet (10 mg total) by mouth at bedtime. For high cholesterol    Dispense:  90 tablet    Refill:  1  . montelukast (SINGULAIR) 10 MG tablet    Sig: Take 1 tablet (10 mg total) by mouth at bedtime.    Dispense:  90 tablet    Refill:  3    Problem List Items Addressed This Visit      Unprioritized   Spinal stenosis of lumbar region    Pt in a wheelchair majority of the day Uses walker very little Family is requesting help for ADLs at home because she lives by herself        Relevant Orders   Ambulatory referral to [place] hypertension - Primary    Stable---  con't meds      Relevant Medications   losartan-hydrochlorothiazide (HYZAAR) 100-12.5 MG tablet   atorvastatin (LIPITOR) 10 MG tablet   Other Relevant Orders   Comprehensive metabolic panel (Completed)    Other Visit Diagnoses    Hyperlipidemia        Relevant Medications     losartan-hydrochlorothiazide (HYZAAR) 100-12.5 MG tablet    atorvastatin (LIPITOR) 10 MG tablet    Other Relevant Orders    Comprehensive metabolic panel (Completed)    Lipid panel (Completed)    Seasonal allergies        Relevant Medications    montelukast (SINGULAIR) 10 MG tablet    Pain in joint of right shoulder        Relevant Orders    DG Shoulder Right (Completed)    Ambulatory referral to Home Health       Follow-up: Return in about 6 months (around 04/25/2016), or if symptoms worsen or fail to improve, for hypertension, hyperlipidemia.  Ann Held, DO

## 2015-10-25 ENCOUNTER — Other Ambulatory Visit: Payer: Self-pay

## 2015-10-25 DIAGNOSIS — M25511 Pain in right shoulder: Secondary | ICD-10-CM

## 2015-10-25 DIAGNOSIS — M1990 Unspecified osteoarthritis, unspecified site: Secondary | ICD-10-CM

## 2015-10-26 ENCOUNTER — Telehealth: Payer: Self-pay | Admitting: Family Medicine

## 2015-10-26 NOTE — Telephone Encounter (Signed)
Can be reached: [phone] Pharmacy: [facility] [address]  Reason for call: Pt needing refill on tramadol. She has a few days left.

## 2015-10-26 NOTE — Telephone Encounter (Signed)
Last seen 10/24/15 and filled 08/02/15 #[id number]   Please advise     KP

## 2015-10-27 DIAGNOSIS — M6281 Muscle weakness (generalized): Secondary | ICD-10-CM | POA: Diagnosis not present

## 2015-10-27 DIAGNOSIS — M4806 Spinal stenosis, lumbar region: Secondary | ICD-10-CM | POA: Diagnosis not present

## 2015-10-27 MED ORDER — TRAMADOL HCL 50 MG PO TABS: 50.0000 mg | ORAL_TABLET | Freq: Three times a day (TID) | ORAL | Status: DC

## 2015-10-27 NOTE — Telephone Encounter (Signed)
Patient aware Rx being faxed and her paperwork is ready for pick up.,     KP

## 2015-10-27 NOTE — Telephone Encounter (Signed)
Refill x1   1 refill 

## 2015-10-30 ENCOUNTER — Telehealth: Payer: Self-pay | Admitting: *Deleted

## 2015-10-30 NOTE — Telephone Encounter (Signed)
Received Physician Verbal Order; forwarded to provider/SLS 05/08

## 2015-10-30 NOTE — Telephone Encounter (Signed)
Received [place]; forwarded to provider/SLS 0/08

## 2015-10-31 DIAGNOSIS — M6281 Muscle weakness (generalized): Secondary | ICD-10-CM | POA: Diagnosis not present

## 2015-10-31 DIAGNOSIS — M4806 Spinal stenosis, lumbar region: Secondary | ICD-10-CM | POA: Diagnosis not present

## 2015-10-31 NOTE — Telephone Encounter (Signed)
Pt called, notified her that RX was faxed for Tramadol on 5/5

## 2015-11-02 DIAGNOSIS — M4806 Spinal stenosis, lumbar region: Secondary | ICD-10-CM | POA: Diagnosis not present

## 2015-11-02 DIAGNOSIS — M6281 Muscle weakness (generalized): Secondary | ICD-10-CM | POA: Diagnosis not present

## 2015-11-02 NOTE — Telephone Encounter (Signed)
Signed order faxed to [facility] at [phone] successfully. JG//CMA

## 2015-11-02 NOTE — Telephone Encounter (Signed)
Signed forms faxed to [facility] successfully at [phone]. Sent for scanning. JG//CMA

## 2015-11-02 NOTE — Telephone Encounter (Signed)
Signed order faxed to [facility] at [phone] successfully. JG//CMA

## 2015-11-03 ENCOUNTER — Other Ambulatory Visit: Payer: Self-pay

## 2015-11-03 ENCOUNTER — Telehealth: Payer: Self-pay | Admitting: *Deleted

## 2015-11-03 DIAGNOSIS — M48061 Spinal stenosis, lumbar region without neurogenic claudication: Secondary | ICD-10-CM

## 2015-11-03 DIAGNOSIS — M4806 Spinal stenosis, lumbar region: Secondary | ICD-10-CM | POA: Diagnosis not present

## 2015-11-03 DIAGNOSIS — S81802A Unspecified open wound, left lower leg, initial encounter: Secondary | ICD-10-CM

## 2015-11-03 DIAGNOSIS — Z7409 Other reduced mobility: Secondary | ICD-10-CM

## 2015-11-03 DIAGNOSIS — M792 Neuralgia and neuritis, unspecified: Secondary | ICD-10-CM

## 2015-11-03 DIAGNOSIS — I10 Essential (primary) hypertension: Secondary | ICD-10-CM | POA: Diagnosis not present

## 2015-11-03 NOTE — Telephone Encounter (Signed)
Forwarded to Dr. Lowne-Chase. JG//CMA 

## 2015-11-06 DIAGNOSIS — I10 Essential (primary) hypertension: Secondary | ICD-10-CM | POA: Diagnosis not present

## 2015-11-06 DIAGNOSIS — M4806 Spinal stenosis, lumbar region: Secondary | ICD-10-CM | POA: Diagnosis not present

## 2015-11-06 DIAGNOSIS — M12811 Other specific arthropathies, not elsewhere classified, right shoulder: Secondary | ICD-10-CM | POA: Diagnosis not present

## 2015-11-07 DIAGNOSIS — M4806 Spinal stenosis, lumbar region: Secondary | ICD-10-CM | POA: Diagnosis not present

## 2015-11-07 DIAGNOSIS — I10 Essential (primary) hypertension: Secondary | ICD-10-CM | POA: Diagnosis not present

## 2015-11-08 DIAGNOSIS — I10 Essential (primary) hypertension: Secondary | ICD-10-CM | POA: Diagnosis not present

## 2015-11-08 DIAGNOSIS — M4806 Spinal stenosis, lumbar region: Secondary | ICD-10-CM | POA: Diagnosis not present

## 2015-11-08 NOTE — Telephone Encounter (Signed)
Signed forms faxed to [facility] successfully at [phone]. Sent for scanning. JG//CMA

## 2015-11-10 DIAGNOSIS — I10 Essential (primary) hypertension: Secondary | ICD-10-CM | POA: Diagnosis not present

## 2015-11-10 DIAGNOSIS — M4806 Spinal stenosis, lumbar region: Secondary | ICD-10-CM | POA: Diagnosis not present

## 2015-11-14 DIAGNOSIS — M4806 Spinal stenosis, lumbar region: Secondary | ICD-10-CM | POA: Diagnosis not present

## 2015-11-14 DIAGNOSIS — I10 Essential (primary) hypertension: Secondary | ICD-10-CM | POA: Diagnosis not present

## 2015-11-15 DIAGNOSIS — I10 Essential (primary) hypertension: Secondary | ICD-10-CM | POA: Diagnosis not present

## 2015-11-15 DIAGNOSIS — M4806 Spinal stenosis, lumbar region: Secondary | ICD-10-CM | POA: Diagnosis not present

## 2015-11-16 DIAGNOSIS — I10 Essential (primary) hypertension: Secondary | ICD-10-CM | POA: Diagnosis not present

## 2015-11-16 DIAGNOSIS — M4806 Spinal stenosis, lumbar region: Secondary | ICD-10-CM | POA: Diagnosis not present

## 2015-11-21 DIAGNOSIS — I10 Essential (primary) hypertension: Secondary | ICD-10-CM | POA: Diagnosis not present

## 2015-11-21 DIAGNOSIS — M4806 Spinal stenosis, lumbar region: Secondary | ICD-10-CM | POA: Diagnosis not present

## 2015-11-23 DIAGNOSIS — I10 Essential (primary) hypertension: Secondary | ICD-10-CM | POA: Diagnosis not present

## 2015-11-23 DIAGNOSIS — M4806 Spinal stenosis, lumbar region: Secondary | ICD-10-CM | POA: Diagnosis not present

## 2015-11-29 DIAGNOSIS — M12811 Other specific arthropathies, not elsewhere classified, right shoulder: Secondary | ICD-10-CM | POA: Diagnosis not present

## 2015-12-04 ENCOUNTER — Telehealth: Payer: Self-pay | Admitting: *Deleted

## 2015-12-04 NOTE — Telephone Encounter (Signed)
Forwarded to [facility] for 12/12/15 appt. JG//CMA

## 2015-12-12 ENCOUNTER — Ambulatory Visit (INDEPENDENT_AMBULATORY_CARE_PROVIDER_SITE_OTHER): Payer: Medicare Other | Admitting: Family Medicine

## 2015-12-12 ENCOUNTER — Encounter: Payer: Self-pay | Admitting: Family Medicine

## 2015-12-12 VITALS — BP 136/74 | HR 90 | Temp 97.5°F | Ht 65.0 in | Wt 143.0 lb

## 2015-12-12 DIAGNOSIS — Z01810 Encounter for preprocedural cardiovascular examination: Secondary | ICD-10-CM

## 2015-12-12 DIAGNOSIS — I10 Essential (primary) hypertension: Secondary | ICD-10-CM

## 2015-12-12 NOTE — Progress Notes (Signed)
Subjective:    Katherine Owen is a 82 y.o. female who presents to the office today for a preoperative consultation at the request of surgeon Dr Onnie Graham who plans on performing R shoulder replacement on TBA -. This consultation is requested for the specific conditions prompting preoperative evaluation (i.e. because of potential affect on operative risk): age. Planned anesthesia: general. The patient has the following known anesthesia issues: no hx of problems. Patients bleeding risk: no recent abnormal bleeding. Patient does not have objections to receiving blood products if needed.  The following portions of the patient's history were reviewed and updated as appropriate:  She  has a past medical history of Hyperlipidemia; Hypertension; Allergy; Arthritis; Cataract; and History of shingles. She  does not have any pertinent problems on file. She  has past surgical history that includes Joint replacement (2003, 2009); Ankle surgery (Left, 2014); Colonoscopy; Eye surgery; Spine surgery; Back surgery (2015); and Total hip arthroplasty (Bilateral, 2005, 2009). Her family history includes Arthritis in her father and mother; Cancer in her mother; Parkinson's disease in her father. There is no history of Anesthesia problems. She  reports that she has quit smoking. She has never used smokeless tobacco. She reports that she does not drink alcohol or use illicit drugs. She has a current medication list which includes the following prescription(s): aspirin-acetaminophen-caffeine, atorvastatin, celecoxib, cetirizine, montelukast, centrum silver, senna-docusate, tramadol, and losartan-hydrochlorothiazide. Current Outpatient Prescriptions on File Prior to Visit  Medication Sig Dispense Refill  . Aspirin-Acetaminophen-Caffeine (EXCEDRIN EXTRA STRENGTH PO) Take by mouth.    Marland Kitchen atorvastatin (LIPITOR) 10 MG tablet Take 1 tablet (10 mg total) by mouth at bedtime. For high cholesterol 90 tablet 1  . celecoxib (CELEBREX)  200 MG capsule Take 1 capsule (200 mg total) by mouth daily. 100 capsule 0  . cetirizine (ZYRTEC ALLERGY) 10 MG tablet Take 10 mg by mouth at bedtime.    . montelukast (SINGULAIR) 10 MG tablet Take 1 tablet (10 mg total) by mouth at bedtime. 90 tablet 3  . Multiple Vitamins-Minerals (CENTRUM SILVER) tablet Take 1 tablet by mouth daily.    Marland Kitchen senna-docusate (SENOKOT-S) 8.6-50 MG per tablet Take 1 tablet by mouth 2 (two) times daily. For constipation    . traMADol (ULTRAM) 50 MG tablet Take 1 tablet (50 mg total) by mouth 3 (three) times daily. 270 tablet 1  . losartan-hydrochlorothiazide (HYZAAR) 100-12.5 MG tablet Take 1 tablet by mouth at bedtime. For hypertension (Patient not taking: Reported on 12/12/2015) 90 tablet 1   No current facility-administered medications on file prior to visit.   She has No Known Allergies..  Review of Systems Review of Systems  Constitutional: Negative for activity change, appetite change and fatigue.  HENT: Negative for hearing loss, congestion, tinnitus and ear discharge.  dentist q47m Eyes: Negative for visual disturbance (see optho q1y -- vision corrected to 20/20 with glasses).  Respiratory: Negative for cough, chest tightness and shortness of breath.   Cardiovascular: Negative for chest pain, palpitations and leg swelling.  Gastrointestinal: Negative for abdominal pain, diarrhea, constipation and abdominal distention.  Genitourinary: Negative for urgency, frequency, decreased urine volume and difficulty urinating.  Musculoskeletal: Negative for back pain, + shoulder pain Skin: Negative for color change, pallor and rash.  Neurological: Negative for dizziness, light-headedness, numbness and headaches.  Hematological: Negative for adenopathy. Does not bruise/bleed easily.  Psychiatric/Behavioral: Negative for suicidal ideas, confusion, sleep disturbance, self-injury, dysphoric mood, decreased concentration and agitation.        Objective:    BP 136/74  mmHg  Pulse 90  Temp(Src) 97.5 F (36.4 C) (Oral)  Ht 5\' 5"  (1.651 m)  Wt 143 lb (64.864 kg)  BMI 23.80 kg/m2  SpO2 98% General appearance: alert, cooperative, appears stated age and no distress Head: Normocephalic, without obvious abnormality, atraumatic Eyes: conjunctivae/corneas clear. PERRL, EOM's intact. Fundi benign. Ears: normal TM's and external ear canals both ears Nose: Nares normal. Septum midline. Mucosa normal. No drainage or sinus tenderness. Throat: lips, mucosa, and tongue normal; teeth and gums normal Neck: no adenopathy, no carotid bruit, no JVD, supple, symmetrical, trachea midline and thyroid not enlarged, symmetric, no tenderness/mass/nodules Back: symmetric, no curvature. ROM normal. No CVA tenderness. Lungs: clear to auscultation bilaterally Heart: regular rate and rhythm, S1, S2 normal, no murmur, click, rub or gallop Abdomen: soft, non-tender; bowel sounds normal; no masses,  no organomegaly Extremities: R shoulder -- dec ROM with significant pain Pulses: 2+ and symmetric Skin: Skin color, texture, turgor normal. No rashes or lesions Lymph nodes: Cervical, supraclavicular, and axillary nodes normal. Neurologic: Alert and oriented X 3, normal strength and tone. Normal symmetric reflexes. Normal coordination and gait  Predictors of intubation difficulty:  Morbid obesity? yes -     Cardiographics ECG: first ekg LBBB-- but pt was very out of breath from transferring to table from wheelchair---- repeat NSR Echocardiogram: not done  Imaging Chest x-ray: not done   Lab Review  Office Visit on 10/24/2015  Component Date Value  . Sodium 10/24/2015 139   . Potassium 10/24/2015 4.5   . Chloride 10/24/2015 104   . CO2 10/24/2015 31   . Glucose, Bld 10/24/2015 92   . BUN 10/24/2015 19   . Creatinine, Ser 10/24/2015 0.57   . Total Bilirubin 10/24/2015 0.3   . Alkaline Phosphatase 10/24/2015 73   . AST 10/24/2015 29   . ALT 10/24/2015 20   . Total Protein  10/24/2015 6.7   . Albumin 10/24/2015 3.8   . Calcium 10/24/2015 10.3   . GFR 10/24/2015 129.36   . Cholesterol 10/24/2015 162   . Triglycerides 10/24/2015 104.0   . HDL 10/24/2015 52.20   . VLDL 10/24/2015 20.8   . LDL Cholesterol 10/24/2015 89   . Total CHOL/HDL Ratio 10/24/2015 3   . NonHDL 10/24/2015 109.76       Assessment:      82 y.o. female with planned surgery as above.   Pt being referred to cardiology due to high risk surgery and age    Cardiac Risk Estimation: ---per cardiology   Plan:    1. Preoperative workup as follows cardiology referral for clearance. 2. Change in medication regimen before surgery: per surgical team. 3. Deep vein thrombosis prophylaxis postoperatively:regimen to be chosen by surgical team.

## 2015-12-12 NOTE — Patient Instructions (Signed)
Shoulder Joint Replacement Shoulder replacement surgery replaces damaged surfaces with artificial parts (prostheses). Usually, there are two parts used to replace this joint:  The humeral component replaces the head of the upper arm bone. It is made of metal (usually cobalt-chromium-based alloys). This is a rounded ball attached to a stem that fits into the humerus bone. This part comes in various sizes and can be a single piece or a modular unit.  The glenoid component replaces the socket (glenoid depression). It is made of ultra high-density polyethylene. Some versions have a metal tray, but totally plastic versions are more common. Depending on the damage to your shoulder, the surgeon may replace just the humeral head (hemiarthroplasty) or both the humeral head and the glenoid (total shoulder replacement). The shoulder parts come in various sizes and shapes to fit different people. They are held in place with either bone cement (cemented) or bone ingrowth (cementless).  The surrounding muscles and tendons hold the prosthetic parts in place, the same as the original shoulder. Each case is individual, and your surgeon will evaluate your situation carefully before making any decisions. Ask what type of implant will be used and why that choice is appropriate for you. LET [facility] CARE PROVIDER KNOW ABOUT:  Any allergies you have.  All medicines you are taking, including vitamins, herbs, eye drops, creams, and over-the-counter medicines.  Previous problems you or members of your family have had with the use of anesthetics.  Any blood disorders you have.  Previous surgeries you have had.  Medical conditions you have. RISKS AND COMPLICATIONS  Generally, shoulder joint replacement is a safe procedure. However, as with any procedure, complications can occur. Possible complications include:  Infection.  Upper arm bone fracture that occurs during surgery (intraoperative fracture) or  postoperative fractures.  Postoperative instability.  Loosening of the glenoid component over time. BEFORE THE PROCEDURE  Do not eat or drink after midnight the day before your procedure, or as instructed.   Ask your health care provider about changing or stopping any regular medicines. You may need to stop taking certain medicines up to 1 week before the procedure.   You may have lab tests the morning of surgery.   Make arrangements for someone to assist you at home for the first week after discharge from the hospital. [place] will be given a medicine that numbs the shoulder area (regional anesthetic) or a medicine that makes you go to sleep (general anesthetic).  The surgical cut (incision) is 4-6 inches (10-15 cm) and is made on the front of the shoulder from the collarbone (clavicle) to the point where the shoulder muscle (deltoid) attaches to the upper arm bone. The surgeon will take care not to injure the nerves or blood vessels that cross the shoulder.  The upper arm bone is dislocated from the socket to expose the ball-like end of the upper arm. Only the portion of the bone covered by cartilage is removed. Articular cartilage covers the ends of bones where they meet the ends of other bones.  The center cavity of the humerus bone is cleaned and enlarged with reamers to create a hollow area that matches the shape of the implant stem. The top end of the bone is smoothed so the stem can be inserted flush with the bone surface.  If the ball of the prosthesis is a separate piece, the proper size is selected and attached.  If the socket portion of the joint is basically healthy and the  surrounding muscles are intact, the surgeon may decide not to replace it. However, if the socket is arthritic, the upper arm bone is moved to the back, and the surgeon will implant the glenoid component. The surgeon prepares the socket surface by removing the remaining damaged cartilage. The  socket bone is then gently reamed to match the implant. Protrusions on the artificial socket part are then fitted into holes drilled in the bone surface. Once the part fits, it is cemented into position.  The arm bone, with its new artificial head, is replaced in the socket. The surgeon reattaches the supporting tendons and closes the incision.  The arm is placed in a sling, and a support pillow is placed under the elbow to protect the repair.  Tubes are placed to remove excess drainage. These are usually removed a day or two later. AFTER THE PROCEDURE  You will be taken to a recovery area and monitored until the anesthetic wears off.  Your arm will be numb from the regional anesthetic. This may last until the following day.  Pain medicines will be given to control the pain.  Typically, you will remain in the hospital for 2-3 days.  Your arm and shoulder will be stiff and bruised. This will improve over time.  An icing device is placed around your shoulder. This helps to control pain and swelling.  Your arm will be in a sling. You will need to wear this for 4-6 weeks after surgery.  The day after surgery, your health care team will begin to show you exercises for your shoulder.   This information is not intended to replace advice given to you by your health care provider. Make sure you discuss any questions you have with your health care provider.   Document Released: 03/09/2003 Document Revised: 07/01/2014 Document Reviewed: 01/07/2013 Elsevier Interactive Patient Education [employer].

## 2015-12-13 ENCOUNTER — Telehealth: Payer: Self-pay | Admitting: Family Medicine

## 2015-12-13 LAB — CBC WITH DIFFERENTIAL/PLATELET
BASOS ABS: 0 10*3/uL (ref 0.0–0.1)
Basophils Relative: 0.4 % (ref 0.0–3.0)
Eosinophils Absolute: 0.1 10*3/uL (ref 0.0–0.7)
Eosinophils Relative: 1.6 % (ref 0.0–5.0)
HCT: 37.9 % (ref 36.0–46.0)
Hemoglobin: 12.3 g/dL (ref 12.0–15.0)
Lymphocytes Relative: 22.3 % (ref 12.0–46.0)
Lymphs Abs: 1.8 10*3/uL (ref 0.7–4.0)
MCHC: 32.6 g/dL (ref 30.0–36.0)
MCV: 82.3 fl (ref 78.0–100.0)
MONO ABS: 0.7 10*3/uL (ref 0.1–1.0)
Monocytes Relative: 7.9 % (ref 3.0–12.0)
NEUTROS PCT: 67.8 % (ref 43.0–77.0)
Neutro Abs: 5.6 10*3/uL (ref 1.4–7.7)
Platelets: 267 10*3/uL (ref 150.0–400.0)
RBC: 4.61 Mil/uL (ref 3.87–5.11)
RDW: 15 % (ref 11.5–15.5)
WBC: 8.3 10*3/uL (ref 4.0–10.5)

## 2015-12-13 NOTE — Telephone Encounter (Signed)
Caller name:Carrie Relationship to patient:Care Everywhere Can be [phone] Pharmacy:  Reason for call: have no record of EKG, do have ECG on 04/25/14. Please advise

## 2015-12-14 ENCOUNTER — Telehealth: Payer: Self-pay | Admitting: Family Medicine

## 2015-12-14 NOTE — Telephone Encounter (Signed)
Discussed with patient and made her aware that Home health does not come out to clean, I gave her the number to [facility] in [place] and made her aware they typically deal with Medicaid patient but she can try and see if she can get some help.    KP

## 2015-12-14 NOTE — Telephone Encounter (Signed)
Pt referred to cardiology

## 2015-12-14 NOTE — Telephone Encounter (Signed)
Pt would like to know if PCP is able to get her set up with a home health nurse to help her with cleaning? Pt says that she is unable to clean her home due to her being wheel chair bound ALSO pt says that her rotator cuff is damaged, she has to have surgery.   Please advise    CB: [phone]

## 2015-12-19 ENCOUNTER — Ambulatory Visit (INDEPENDENT_AMBULATORY_CARE_PROVIDER_SITE_OTHER): Payer: Medicare Other | Admitting: Cardiovascular Disease

## 2015-12-19 ENCOUNTER — Encounter: Payer: Self-pay | Admitting: Cardiovascular Disease

## 2015-12-19 VITALS — BP 140/70 | HR 80 | Ht 64.5 in | Wt 148.0 lb

## 2015-12-19 DIAGNOSIS — R011 Cardiac murmur, unspecified: Secondary | ICD-10-CM | POA: Diagnosis not present

## 2015-12-19 DIAGNOSIS — Z01810 Encounter for preprocedural cardiovascular examination: Secondary | ICD-10-CM | POA: Diagnosis not present

## 2015-12-19 NOTE — Progress Notes (Signed)
Chief Complaint  Patient presents with  . Hypertension    surgical clearance  . Hyperlipidemia    Denies any cp,sob,lee,or claudication      History of Present Illness: 82 yo female with history of HTN, HLD who is here today as a new patient for pre-operative cardiovascular examination. She has an upcoming right shoulder surgery. She was seen by Dr. Etter Sjogren in primary care and we are asked to see her for cardiac risk stratification. She has no known heart disease. She has been very active. She has no chest pain or dyspnea. No awareness of palpitations. She does have right shoulder pain. No dizziness, near syncope or syncope. She smoked remotely but stopped in 1970.   Primary Care Physician: Ann Held, DO   Past Medical History  Diagnosis Date  . Hyperlipidemia   . Hypertension   . Allergy   . Arthritis   . Cataract     Bilateral  . History of shingles     Past Surgical History  Procedure Laterality Date  . Ankle surgery Left 2014  . Colonoscopy    . Eye surgery      cataract  . Spine surgery      done at [facility] Apr 26, 2014, lumbar decompression  . [place] surgery  2015    [facility]   . Total hip arthroplasty Bilateral 2005, 2009    [place] , [place]    Current Outpatient Prescriptions  Medication Sig Dispense Refill  . Aspirin-Acetaminophen-Caffeine (EXCEDRIN EXTRA STRENGTH PO) Take 1 tablet by mouth 2 (two) times daily as needed (pain).     Marland Kitchen atorvastatin (LIPITOR) 10 MG tablet Take 1 tablet (10 mg total) by mouth at bedtime. For high cholesterol 90 tablet 1  . celecoxib (CELEBREX) 200 MG capsule Take 1 capsule (200 mg total) by mouth daily. 100 capsule 0  . cetirizine (ZYRTEC ALLERGY) 10 MG tablet Take 10 mg by mouth at bedtime.    . montelukast (SINGULAIR) 10 MG tablet Take 1 tablet (10 mg total) by mouth at bedtime. 90 tablet 3  . Multiple Vitamins-Minerals (CENTRUM SILVER) tablet Take 1 tablet by mouth daily.    Marland Kitchen senna-docusate (SENOKOT-S) 8.6-50 MG per tablet  Take 1 tablet by mouth 2 (two) times daily. For constipation    . traMADol (ULTRAM) 50 MG tablet Take 1 tablet (50 mg total) by mouth 3 (three) times daily. 270 tablet 1   No current facility-administered medications for this visit.    No Known Allergies  Social History   Social History  . Marital Status: Widowed    Spouse Name: N/A  . Number of Children: 3  . Years of Education: N/A   Occupational History  . 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 Smoking status: Former Smoker -- 1.00 packs/day for 20 years    Types: Cigarettes    Quit date: 06/25/2015  . Smokeless tobacco: Never Used  . Alcohol Use: No  . Drug Use: No  . Sexual Activity: Not Currently   Other Topics Concern  . Not on file   Social History Narrative    Family History  Problem Relation Age of Onset  . Cancer Mother     colon  . Arthritis Mother     osteoarthritis  . Arthritis Father     osteoarthritis  . Parkinson's disease Father   . Anesthesia problems Neg Hx     Review of Systems:  As stated in the HPI and otherwise  negative.   BP 140/70 mmHg  Pulse 80  Ht 5' 4.5" (1.638 m)  Wt 148 lb (67.132 kg)  BMI 25.02 kg/m2  Physical Examination: General: Well developed, well nourished, NAD HEENT: OP clear, mucus membranes moist SKIN: warm, dry. No rashes. Neuro: No focal deficits Musculoskeletal: Muscle strength 5/5 all ext Psychiatric: Mood and affect normal Neck: No JVD, no carotid bruits, no thyromegaly, no lymphadenopathy. Lungs:Clear bilaterally, no wheezes, rhonci, crackles Cardiovascular: Regular rate and rhythm. No murmurs, gallops or rubs. Abdomen:Soft. Bowel sounds present. Non-tender.  Extremities: No lower extremity edema. Pulses are 2 + in the bilateral DP/PT.  EKG:  EKG is not ordered today. The ekg ordered today demonstrates  EKG from 12/13/15 with sinus rhythm  Recent Labs: 10/24/2015: ALT 20; BUN 19; Creatinine, Ser 0.57; Potassium 4.5;  Sodium 139 12/12/2015: Hemoglobin 12.3; Platelets 267.0    Wt Readings from Last 3 Encounters:  12/19/15 148 lb (67.132 kg)  12/12/15 143 lb (64.864 kg)  10/24/15 141 lb 9.6 oz (64.229 kg)     Other studies Reviewed: Additional studies/ records that were reviewed today include: . Review of the above records demonstrates:    Assessment and Plan:   1. Cardiac murmur: She has a soft systolic murmur on exam. I will arrange an echo to assess, exclude structural heart disease.   2. Pre-operative cardiovascular examination: She is an active 82 yo female. She has no signs of angina, heart failure or arrhythmias. EKG is reviewed by me today (EKG from last week in primary care) and it shows sinus rhythm with no ischemic changes. I do not think she needs ischemic testing prior to her planned surgical procedure. As above, will arrange echo to assess LVEF and exclude structural heart disease.   Current medicines are reviewed at length with the patient today.  The patient does not have concerns regarding medicines.  The following changes have been made:  no change  Labs/ tests ordered today include:   Orders Placed This Encounter  Procedures  . ECHO COMPLETE     Disposition:   FU with me as needed.    Signed, Lauree Chandler, MD 12/19/2015 11:58 AM    [facility] [address], [place], [place]  [address] Phone: [phone]; Fax: [phone]

## 2015-12-19 NOTE — Patient Instructions (Signed)
Medication Instructions:  Your physician recommends that you continue on your current medications as directed. Please refer to the Current Medication list given to you today.   Labwork: none  Testing/Procedures: Your physician has requested that you have an echocardiogram. Echocardiography is a painless test that uses sound waves to create images of your heart. It provides your doctor with information about the size and shape of your heart and how well your heart's chambers and valves are working. This procedure takes approximately one hour. There are no restrictions for this procedure.    Follow-Up: Your physician recommends that you schedule a follow-up appointment as needed with Dr. Angelena Form    Any Other Special Instructions Will Be Listed Below (If Applicable).     If you need a refill on your cardiac medications before your next appointment, please call your pharmacy.

## 2015-12-20 ENCOUNTER — Other Ambulatory Visit: Payer: Self-pay

## 2015-12-20 ENCOUNTER — Ambulatory Visit (HOSPITAL_COMMUNITY): Payer: Medicare Other | Attending: Cardiology

## 2015-12-20 DIAGNOSIS — Z01810 Encounter for preprocedural cardiovascular examination: Secondary | ICD-10-CM | POA: Insufficient documentation

## 2015-12-20 DIAGNOSIS — R011 Cardiac murmur, unspecified: Secondary | ICD-10-CM | POA: Diagnosis not present

## 2015-12-20 DIAGNOSIS — I059 Rheumatic mitral valve disease, unspecified: Secondary | ICD-10-CM | POA: Insufficient documentation

## 2015-12-20 DIAGNOSIS — I119 Hypertensive heart disease without heart failure: Secondary | ICD-10-CM | POA: Insufficient documentation

## 2015-12-20 DIAGNOSIS — E785 Hyperlipidemia, unspecified: Secondary | ICD-10-CM | POA: Insufficient documentation

## 2015-12-20 LAB — ECHOCARDIOGRAM COMPLETE
AVLVOTPG: 5 mmHg
Ao-asc: 34 cm
CHL CUP TV REG PEAK VELOCITY: 263 cm/s
E decel time: 180 msec
EERAT: 5.76
FS: 27 % — AB (ref 28–44)
IV/PV OW: 1.42
LA diam end sys: 35 mm
LA diam index: 2.02 cm/m2
LA vol: 32 mL
LASIZE: 35 mm
LAVOLA4C: 29 mL
LAVOLIN: 18.5 mL/m2
LV PW d: 11.3 mm — AB (ref 0.6–1.1)
LV e' LATERAL: 9.52 cm/s
LVEEAVG: 5.76
LVEEMED: 5.76
LVOT SV: 67 mL
LVOT VTI: 19.3 cm
LVOT area: 3.46 cm2
LVOT diameter: 21 mm
LVOTPV: 111 cm/s
MV Dec: 180
MV pk A vel: 110 m/s
MVPKEVEL: 54.8 m/s
TDI e' lateral: 9.52
TDI e' medial: 5.39
TR max vel: 263 cm/s

## 2015-12-21 ENCOUNTER — Encounter: Payer: Self-pay | Admitting: Cardiovascular Disease

## 2015-12-25 ENCOUNTER — Telehealth: Payer: Self-pay | Admitting: Family Medicine

## 2015-12-25 NOTE — Telephone Encounter (Signed)
Pt called in to be advised. Pt says that she came in for a surgical clearance visit on 6/20. Pt says that she is scheduled for her cpe on 7/13. Pt would like to know if she still need to have her CPE?     CB: [phone]

## 2015-12-25 NOTE — Telephone Encounter (Signed)
This would be a question for the provider. Please let the patient know I will check with Dr.Lowne and get back to her next week. Thanks    KP

## 2015-12-31 NOTE — Telephone Encounter (Signed)
It is coded differently so [facility] co would still want her to have cpe-- it can be pushed out to to nov/ dec if she would like

## 2016-01-03 ENCOUNTER — Telehealth: Payer: Self-pay

## 2016-01-03 NOTE — Telephone Encounter (Signed)
Pre visit call completed 

## 2016-01-04 ENCOUNTER — Encounter: Payer: Self-pay | Admitting: Family Medicine

## 2016-01-04 ENCOUNTER — Ambulatory Visit (INDEPENDENT_AMBULATORY_CARE_PROVIDER_SITE_OTHER): Payer: Medicare Other | Admitting: Family Medicine

## 2016-01-04 VITALS — BP 138/78 | HR 92 | Temp 97.9°F | Ht 65.0 in | Wt 142.8 lb

## 2016-01-04 DIAGNOSIS — H538 Other visual disturbances: Secondary | ICD-10-CM | POA: Diagnosis not present

## 2016-01-04 DIAGNOSIS — J302 Other seasonal allergic rhinitis: Secondary | ICD-10-CM

## 2016-01-04 DIAGNOSIS — M150 Primary generalized (osteo)arthritis: Secondary | ICD-10-CM

## 2016-01-04 DIAGNOSIS — E785 Hyperlipidemia, unspecified: Secondary | ICD-10-CM

## 2016-01-04 DIAGNOSIS — Z0000 Encounter for general adult medical examination without abnormal findings: Secondary | ICD-10-CM | POA: Diagnosis not present

## 2016-01-04 DIAGNOSIS — M159 Polyosteoarthritis, unspecified: Secondary | ICD-10-CM

## 2016-01-04 MED ORDER — CELECOXIB 200 MG PO CAPS: 200.0000 mg | ORAL_CAPSULE | Freq: Every day | ORAL | Status: DC

## 2016-01-04 MED ORDER — MONTELUKAST SODIUM 10 MG PO TABS: 10.0000 mg | ORAL_TABLET | Freq: Every day | ORAL | Status: DC

## 2016-01-04 MED ORDER — ATORVASTATIN CALCIUM 10 MG PO TABS: 10.0000 mg | ORAL_TABLET | Freq: Every day | ORAL | Status: DC

## 2016-01-04 MED ORDER — CETIRIZINE HCL 10 MG PO TABS: 10.0000 mg | ORAL_TABLET | Freq: Every day | ORAL | Status: DC

## 2016-01-04 MED ORDER — TRAMADOL HCL 50 MG PO TABS: 50.0000 mg | ORAL_TABLET | Freq: Three times a day (TID) | ORAL | Status: DC

## 2016-01-04 NOTE — Patient Instructions (Addendum)
Preventive Care for Adults, Female A healthy lifestyle and preventive care can promote health and wellness. Preventive health guidelines for women include the following key practices.  A routine yearly physical is a good way to check with your health care provider about your health and preventive screening. It is a chance to share any concerns and updates on your health and to receive a thorough exam.  Visit your dentist for a routine exam and preventive care every 6 months. Brush your teeth twice a day and floss once a day. Good oral hygiene prevents tooth decay and gum disease.  The frequency of eye exams is based on your age, health, family medical history, use of contact lenses, and other factors. Follow your health care provider's recommendations for frequency of eye exams.  Eat a healthy diet. Foods like vegetables, fruits, whole grains, low-fat dairy products, and lean protein foods contain the nutrients you need without too many calories. Decrease your intake of foods high in solid fats, added sugars, and salt. Eat the right amount of calories for you.Get information about a proper diet from your health care provider, if necessary.  Regular physical exercise is one of the most important things you can do for your health. Most adults should get at least 150 minutes of moderate-intensity exercise (any activity that increases your heart rate and causes you to sweat) each week. In addition, most adults need muscle-strengthening exercises on 2 or more days a week.  Maintain a healthy weight. The body mass index (BMI) is a screening tool to identify possible weight problems. It provides an estimate of body fat based on height and weight. Your health care provider can find your BMI and can help you achieve or maintain a healthy weight.For adults 20 years and older:  A BMI below 18.5 is considered underweight.  A BMI of 18.5 to 24.9 is normal.  A BMI of 25 to 29.9 is considered overweight.  A  BMI of 30 and above is considered obese.  Maintain normal blood lipids and cholesterol levels by exercising and minimizing your intake of saturated fat. Eat a balanced diet with plenty of fruit and vegetables. Blood tests for lipids and cholesterol should begin at age 45 and be repeated every 5 years. If your lipid or cholesterol levels are high, you are over 50, or you are at high risk for heart disease, you may need your cholesterol levels checked more frequently.Ongoing high lipid and cholesterol levels should be treated with medicines if diet and exercise are not working.  If you smoke, find out from your health care provider how to quit. If you do not use tobacco, do not start.  Lung cancer screening is recommended for adults aged 45-80 years who are at high risk for developing lung cancer because of a history of smoking. A yearly low-dose CT scan of the lungs is recommended for people who have at least a 30-pack-year history of smoking and are a current smoker or have quit within the past 15 years. A pack year of smoking is smoking an average of 1 pack of cigarettes a day for 1 year (for example: 1 pack a day for 30 years or 2 packs a day for 15 years). Yearly screening should continue until the smoker has stopped smoking for at least 15 years. Yearly screening should be stopped for people who develop a health problem that would prevent them from having lung cancer treatment.  If you are pregnant, do not drink alcohol. If you are  breastfeeding, be very cautious about drinking alcohol. If you are not pregnant and choose to drink alcohol, do not have more than 1 drink per day. One drink is considered to be 12 ounces (355 mL) of beer, 5 ounces (148 mL) of wine, or 1.5 ounces (44 mL) of liquor.  Avoid use of street drugs. Do not share needles with anyone. Ask for help if you need support or instructions about stopping the use of drugs.  High blood pressure causes heart disease and increases the risk  of stroke. Your blood pressure should be checked at least every 1 to 2 years. Ongoing high blood pressure should be treated with medicines if weight loss and exercise do not work.  If you are 55-79 years old, ask your health care provider if you should take aspirin to prevent strokes.  Diabetes screening is done by taking a blood sample to check your blood glucose level after you have not eaten for a certain period of time (fasting). If you are not overweight and you do not have risk factors for diabetes, you should be screened once every 3 years starting at age 45. If you are overweight or obese and you are 40-70 years of age, you should be screened for diabetes every year as part of your cardiovascular risk assessment.  Breast cancer screening is essential preventive care for women. You should practice "breast self-awareness." This means understanding the normal appearance and feel of your breasts and may include breast self-examination. Any changes detected, no matter how small, should be reported to a health care provider. Women in their 20s and 30s should have a clinical breast exam (CBE) by a health care provider as part of a regular health exam every 1 to 3 years. After age 40, women should have a CBE every year. Starting at age 40, women should consider having a mammogram (breast X-ray test) every year. Women who have a family history of breast cancer should talk to their health care provider about genetic screening. Women at a high risk of breast cancer should talk to their health care providers about having an MRI and a mammogram every year.  Breast cancer gene (BRCA)-related cancer risk assessment is recommended for women who have family members with BRCA-related cancers. BRCA-related cancers include breast, ovarian, tubal, and peritoneal cancers. Having family members with these cancers may be associated with an increased risk for harmful changes (mutations) in the breast cancer genes BRCA1 and  BRCA2. Results of the assessment will determine the need for genetic counseling and BRCA1 and BRCA2 testing.  Your health care provider may recommend that you be screened regularly for cancer of the pelvic organs (ovaries, uterus, and vagina). This screening involves a pelvic examination, including checking for microscopic changes to the surface of your cervix (Pap test). You may be encouraged to have this screening done every 3 years, beginning at age 21.  For women ages 30-65, health care providers may recommend pelvic exams and Pap testing every 3 years, or they may recommend the Pap and pelvic exam, combined with testing for human papilloma virus (HPV), every 5 years. Some types of HPV increase your risk of cervical cancer. Testing for HPV may also be done on women of any age with unclear Pap test results.  Other health care providers may not recommend any screening for nonpregnant women who are considered low risk for pelvic cancer and who do not have symptoms. Ask your health care provider if a screening pelvic exam is right for   you.  If you have had past treatment for cervical cancer or a condition that could lead to cancer, you need Pap tests and screening for cancer for at least 20 years after your treatment. If Pap tests have been discontinued, your risk factors (such as having a new sexual partner) need to be reassessed to determine if screening should resume. Some women have medical problems that increase the chance of getting cervical cancer. In these cases, your health care provider may recommend more frequent screening and Pap tests.  Colorectal cancer can be detected and often prevented. Most routine colorectal cancer screening begins at the age of 50 years and continues through age 75 years. However, your health care provider may recommend screening at an earlier age if you have risk factors for colon cancer. On a yearly basis, your health care provider may provide home test kits to check  for hidden blood in the stool. Use of a small camera at the end of a tube, to directly examine the colon (sigmoidoscopy or colonoscopy), can detect the earliest forms of colorectal cancer. Talk to your health care provider about this at age 50, when routine screening begins. Direct exam of the colon should be repeated every 5-10 years through age 75 years, unless early forms of precancerous polyps or small growths are found.  People who are at an increased risk for hepatitis B should be screened for this virus. You are considered at high risk for hepatitis B if:  You were born in a country where hepatitis B occurs often. Talk with your health care provider about which countries are considered high risk.  Your parents were born in a high-risk country and you have not received a shot to protect against hepatitis B (hepatitis B vaccine).  You have HIV or AIDS.  You use needles to inject street drugs.  You live with, or have sex with, someone who has hepatitis B.  You get hemodialysis treatment.  You take certain medicines for conditions like cancer, organ transplantation, and autoimmune conditions.  Hepatitis C blood testing is recommended for all people born from 1945 through 1965 and any individual with known risks for hepatitis C.  Practice safe sex. Use condoms and avoid high-risk sexual practices to reduce the spread of sexually transmitted infections (STIs). STIs include gonorrhea, chlamydia, syphilis, trichomonas, herpes, HPV, and human immunodeficiency virus (HIV). Herpes, HIV, and HPV are viral illnesses that have no cure. They can result in disability, cancer, and death.  You should be screened for sexually transmitted illnesses (STIs) including gonorrhea and chlamydia if:  You are sexually active and are younger than 24 years.  You are older than 24 years and your health care provider tells you that you are at risk for this type of infection.  Your sexual activity has changed  since you were last screened and you are at an increased risk for chlamydia or gonorrhea. Ask your health care provider if you are at risk.  If you are at risk of being infected with HIV, it is recommended that you take a prescription medicine daily to prevent HIV infection. This is called preexposure prophylaxis (PrEP). You are considered at risk if:  You are sexually active and do not regularly use condoms or know the HIV status of your partner(s).  You take drugs by injection.  You are sexually active with a partner who has HIV.  Talk with your health care provider about whether you are at high risk of being infected with HIV. If   you choose to begin PrEP, you should first be tested for HIV. You should then be tested every 3 months for as long as you are taking PrEP.  Osteoporosis is a disease in which the bones lose minerals and strength with aging. This can result in serious bone fractures or breaks. The risk of osteoporosis can be identified using a bone density scan. Women ages 67 years and over and women at risk for fractures or osteoporosis should discuss screening with their health care providers. Ask your health care provider whether you should take a calcium supplement or vitamin D to reduce the rate of osteoporosis.  Menopause can be associated with physical symptoms and risks. Hormone replacement therapy is available to decrease symptoms and risks. You should talk to your health care provider about whether hormone replacement therapy is right for you.  Use sunscreen. Apply sunscreen liberally and repeatedly throughout the day. You should seek shade when your shadow is shorter than you. Protect yourself by wearing long sleeves, pants, a wide-brimmed hat, and sunglasses year round, whenever you are outdoors.  Once a month, do a whole body skin exam, using a mirror to look at the skin on your back. Tell your health care provider of new moles, moles that have irregular borders, moles that  are larger than a pencil eraser, or moles that have changed in shape or color.  Stay current with required vaccines (immunizations).  Influenza vaccine. All adults should be immunized every year.  Tetanus, diphtheria, and acellular pertussis (Td, Tdap) vaccine. Pregnant women should receive 1 dose of Tdap vaccine during each pregnancy. The dose should be obtained regardless of the length of time since the last dose. Immunization is preferred during the 27th-36th week of gestation. An adult who has not previously received Tdap or who does not know her vaccine status should receive 1 dose of Tdap. This initial dose should be followed by tetanus and diphtheria toxoids (Td) booster doses every 10 years. Adults with an unknown or incomplete history of completing a 3-dose immunization series with Td-containing vaccines should begin or complete a primary immunization series including a Tdap dose. Adults should receive a Td booster every 10 years.  Varicella vaccine. An adult without evidence of immunity to varicella should receive 2 doses or a second dose if she has previously received 1 dose. Pregnant females who do not have evidence of immunity should receive the first dose after pregnancy. This first dose should be obtained before leaving the health care facility. The second dose should be obtained 4-8 weeks after the first dose.  Human papillomavirus (HPV) vaccine. Females aged 13-26 years who have not received the vaccine previously should obtain the 3-dose series. The vaccine is not recommended for use in pregnant females. However, pregnancy testing is not needed before receiving a dose. If a female is found to be pregnant after receiving a dose, no treatment is needed. In that case, the remaining doses should be delayed until after the pregnancy. Immunization is recommended for any person with an immunocompromised condition through the age of 61 years if she did not get any or all doses earlier. During the  3-dose series, the second dose should be obtained 4-8 weeks after the first dose. The third dose should be obtained 24 weeks after the first dose and 16 weeks after the second dose.  Zoster vaccine. One dose is recommended for adults aged 30 years or older unless certain conditions are present.  Measles, mumps, and rubella (MMR) vaccine. Adults born  before 1957 generally are considered immune to measles and mumps. Adults born in 1957 or later should have 1 or more doses of MMR vaccine unless there is a contraindication to the vaccine or there is laboratory evidence of immunity to each of the three diseases. A routine second dose of MMR vaccine should be obtained at least 28 days after the first dose for students attending postsecondary schools, health care workers, or international travelers. People who received inactivated measles vaccine or an unknown type of measles vaccine during 1963-1967 should receive 2 doses of MMR vaccine. People who received inactivated mumps vaccine or an unknown type of mumps vaccine before 1979 and are at high risk for mumps infection should consider immunization with 2 doses of MMR vaccine. For females of childbearing age, rubella immunity should be determined. If there is no evidence of immunity, females who are not pregnant should be vaccinated. If there is no evidence of immunity, females who are pregnant should delay immunization until after pregnancy. Unvaccinated health care workers born before 1957 who lack laboratory evidence of measles, mumps, or rubella immunity or laboratory confirmation of disease should consider measles and mumps immunization with 2 doses of MMR vaccine or rubella immunization with 1 dose of MMR vaccine.  Pneumococcal 13-valent conjugate (PCV13) vaccine. When indicated, a person who is uncertain of his immunization history and has no record of immunization should receive the PCV13 vaccine. All adults 65 years of age and older should receive this  vaccine. An adult aged 19 years or older who has certain medical conditions and has not been previously immunized should receive 1 dose of PCV13 vaccine. This PCV13 should be followed with a dose of pneumococcal polysaccharide (PPSV23) vaccine. Adults who are at high risk for pneumococcal disease should obtain the PPSV23 vaccine at least 8 weeks after the dose of PCV13 vaccine. Adults older than 80 years of age who have normal immune system function should obtain the PPSV23 vaccine dose at least 1 year after the dose of PCV13 vaccine.  Pneumococcal polysaccharide (PPSV23) vaccine. When PCV13 is also indicated, PCV13 should be obtained first. All adults aged 65 years and older should be immunized. An adult younger than age 65 years who has certain medical conditions should be immunized. Any person who resides in a nursing home or long-term care facility should be immunized. An adult smoker should be immunized. People with an immunocompromised condition and certain other conditions should receive both PCV13 and PPSV23 vaccines. People with human immunodeficiency virus (HIV) infection should be immunized as soon as possible after diagnosis. Immunization during chemotherapy or radiation therapy should be avoided. Routine use of PPSV23 vaccine is not recommended for American Indians, [place] Natives, or people younger than 65 years unless there are medical conditions that require PPSV23 vaccine. When indicated, people who have unknown immunization and have no record of immunization should receive PPSV23 vaccine. One-time revaccination 5 years after the first dose of PPSV23 is recommended for people aged 19-64 years who have chronic kidney failure, nephrotic syndrome, asplenia, or immunocompromised conditions. People who received 1-2 doses of PPSV23 before age 65 years should receive another dose of PPSV23 vaccine at age 65 years or later if at least 5 years have passed since the previous dose. Doses of PPSV23 are not  needed for people immunized with PPSV23 at or after age 65 years.  Meningococcal vaccine. Adults with asplenia or persistent complement component deficiencies should receive 2 doses of quadrivalent meningococcal conjugate (MenACWY-D) vaccine. The doses should be obtained   at least 2 months apart. Microbiologists working with certain meningococcal bacteria, [place] recruits, people at risk during an outbreak, and people who travel to or live in countries with a high rate of meningitis should be immunized. A first-year college student up through age 34 years who is living in a residence hall should receive a dose if she did not receive a dose on or after her 16th birthday. Adults who have certain high-risk conditions should receive one or more doses of vaccine.  Hepatitis A vaccine. Adults who wish to be protected from this disease, have certain high-risk conditions, work with hepatitis A-infected animals, work in hepatitis A research labs, or travel to or work in countries with a high rate of hepatitis A should be immunized. Adults who were previously unvaccinated and who anticipate close contact with an international adoptee during the first 60 days after arrival in the [place] from a country with a high rate of hepatitis A should be immunized.  Hepatitis B vaccine. Adults who wish to be protected from this disease, have certain high-risk conditions, may be exposed to blood or other infectious body fluids, are household contacts or sex partners of hepatitis B positive people, are clients or workers in certain care facilities, or travel to or work in countries with a high rate of hepatitis B should be immunized.  Haemophilus influenzae type b (Hib) vaccine. A previously unvaccinated person with asplenia or sickle cell disease or having a scheduled splenectomy should receive 1 dose of Hib vaccine. Regardless of previous immunization, a recipient of a hematopoietic stem cell transplant should receive a  3-dose series 6-12 months after her successful transplant. Hib vaccine is not recommended for adults with HIV infection. Preventive Services / Frequency Ages 35 to 4 years  Blood pressure check.** / Every 3-5 years.  Lipid and cholesterol check.** / Every 5 years beginning at age 60.  Clinical breast exam.** / Every 3 years for women in their 71s and 10s.  BRCA-related cancer risk assessment.** / For women who have family members with a BRCA-related cancer (breast, ovarian, tubal, or peritoneal cancers).  Pap test.** / Every 2 years from ages 76 through 26. Every 3 years starting at age 61 through age 76 or 93 with a history of 3 consecutive normal Pap tests.  HPV screening.** / Every 3 years from ages 37 through ages 60 to 51 with a history of 3 consecutive normal Pap tests.  Hepatitis C blood test.** / For any individual with known risks for hepatitis C.  Skin self-exam. / Monthly.  Influenza vaccine. / Every year.  Tetanus, diphtheria, and acellular pertussis (Tdap, Td) vaccine.** / Consult your health care provider. Pregnant women should receive 1 dose of Tdap vaccine during each pregnancy. 1 dose of Td every 10 years.  Varicella vaccine.** / Consult your health care provider. Pregnant females who do not have evidence of immunity should receive the first dose after pregnancy.  HPV vaccine. / 3 doses over 6 months, if 93 and younger. The vaccine is not recommended for use in pregnant females. However, pregnancy testing is not needed before receiving a dose.  Measles, mumps, rubella (MMR) vaccine.** / You need at least 1 dose of MMR if you were born in 1957 or later. You may also need a 2nd dose. For females of childbearing age, rubella immunity should be determined. If there is no evidence of immunity, females who are not pregnant should be vaccinated. If there is no evidence of immunity, females who are  pregnant should delay immunization until after pregnancy.  Pneumococcal  13-valent conjugate (PCV13) vaccine.** / Consult your health care provider.  Pneumococcal polysaccharide (PPSV23) vaccine.** / 1 to 2 doses if you smoke cigarettes or if you have certain conditions.  Meningococcal vaccine.** / 1 dose if you are age 68 to 8 years and a Market researcher living in a residence hall, or have one of several medical conditions, you need to get vaccinated against meningococcal disease. You may also need additional booster doses.  Hepatitis A vaccine.** / Consult your health care provider.  Hepatitis B vaccine.** / Consult your health care provider.  Haemophilus influenzae type b (Hib) vaccine.** / Consult your health care provider. Ages 7 to 53 years  Blood pressure check.** / Every year.  Lipid and cholesterol check.** / Every 5 years beginning at age 25 years.  Lung cancer screening. / Every year if you are aged 11-80 years and have a 30-pack-year history of smoking and currently smoke or have quit within the past 15 years. Yearly screening is stopped once you have quit smoking for at least 15 years or develop a health problem that would prevent you from having lung cancer treatment.  Clinical breast exam.** / Every year after age 48 years.  BRCA-related cancer risk assessment.** / For women who have family members with a BRCA-related cancer (breast, ovarian, tubal, or peritoneal cancers).  Mammogram.** / Every year beginning at age 41 years and continuing for as long as you are in good health. Consult with your health care provider.  Pap test.** / Every 3 years starting at age 65 years through age 37 or 70 years with a history of 3 consecutive normal Pap tests.  HPV screening.** / Every 3 years from ages 72 years through ages 60 to 40 years with a history of 3 consecutive normal Pap tests.  Fecal occult blood test (FOBT) of stool. / Every year beginning at age 21 years and continuing until age 5 years. You may not need to do this test if you get  a colonoscopy every 10 years.  Flexible sigmoidoscopy or colonoscopy.** / Every 5 years for a flexible sigmoidoscopy or every 10 years for a colonoscopy beginning at age 35 years and continuing until age 48 years.  Hepatitis C blood test.** / For all people born from [birth year] through 1965 and any individual with known risks for hepatitis C.  Skin self-exam. / Monthly.  Influenza vaccine. / Every year.  Tetanus, diphtheria, and acellular pertussis (Tdap/Td) vaccine.** / Consult your health care provider. Pregnant women should receive 1 dose of Tdap vaccine during each pregnancy. 1 dose of Td every 10 years.  Varicella vaccine.** / Consult your health care provider. Pregnant females who do not have evidence of immunity should receive the first dose after pregnancy.  Zoster vaccine.** / 1 dose for adults aged 30 years or older.  Measles, mumps, rubella (MMR) vaccine.** / You need at least 1 dose of MMR if you were born in 1957 or later. You may also need a second dose. For females of childbearing age, rubella immunity should be determined. If there is no evidence of immunity, females who are not pregnant should be vaccinated. If there is no evidence of immunity, females who are pregnant should delay immunization until after pregnancy.  Pneumococcal 13-valent conjugate (PCV13) vaccine.** / Consult your health care provider.  Pneumococcal polysaccharide (PPSV23) vaccine.** / 1 to 2 doses if you smoke cigarettes or if you have certain conditions.  Meningococcal vaccine.** /  Consult your health care provider.  Hepatitis A vaccine.** / Consult your health care provider.  Hepatitis B vaccine.** / Consult your health care provider.  Haemophilus influenzae type b (Hib) vaccine.** / Consult your health care provider. Ages 64 years and over  Blood pressure check.** / Every year.  Lipid and cholesterol check.** / Every 5 years beginning at age 23 years.  Lung cancer screening. / Every year if you  are aged 16-80 years and have a 30-pack-year history of smoking and currently smoke or have quit within the past 15 years. Yearly screening is stopped once you have quit smoking for at least 15 years or develop a health problem that would prevent you from having lung cancer treatment.  Clinical breast exam.** / Every year after age 74 years.  BRCA-related cancer risk assessment.** / For women who have family members with a BRCA-related cancer (breast, ovarian, tubal, or peritoneal cancers).  Mammogram.** / Every year beginning at age 44 years and continuing for as long as you are in good health. Consult with your health care provider.  Pap test.** / Every 3 years starting at age 58 years through age 22 or 39 years with 3 consecutive normal Pap tests. Testing can be stopped between 65 and 70 years with 3 consecutive normal Pap tests and no abnormal Pap or HPV tests in the past 10 years.  HPV screening.** / Every 3 years from ages 64 years through ages 70 or 61 years with a history of 3 consecutive normal Pap tests. Testing can be stopped between 65 and 70 years with 3 consecutive normal Pap tests and no abnormal Pap or HPV tests in the past 10 years.  Fecal occult blood test (FOBT) of stool. / Every year beginning at age 40 years and continuing until age 27 years. You may not need to do this test if you get a colonoscopy every 10 years.  Flexible sigmoidoscopy or colonoscopy.** / Every 5 years for a flexible sigmoidoscopy or every 10 years for a colonoscopy beginning at age 7 years and continuing until age 32 years.  Hepatitis C blood test.** / For all people born from [birth year] through 1963 and any individual with known risks for hepatitis C.  Osteoporosis screening.** / A one-time screening for women ages 30 years and over and women at risk for fractures or osteoporosis.  Skin self-exam. / Monthly.  Influenza vaccine. / Every year.  Tetanus, diphtheria, and acellular pertussis (Tdap/Td)  vaccine.** / 1 dose of Td every 10 years.  Varicella vaccine.** / Consult your health care provider.  Zoster vaccine.** / 1 dose for adults aged 35 years or older.  Pneumococcal 13-valent conjugate (PCV13) vaccine.** / Consult your health care provider.  Pneumococcal polysaccharide (PPSV23) vaccine.** / 1 dose for all adults aged 46 years and older.  Meningococcal vaccine.** / Consult your health care provider.  Hepatitis A vaccine.** / Consult your health care provider.  Hepatitis B vaccine.** / Consult your health care provider.  Haemophilus influenzae type b (Hib) vaccine.** / Consult your health care provider. ** Family history and personal history of risk and conditions may change your health care provider's recommendations.   This information is not intended to replace advice given to you by your health care provider. Make sure you discuss any questions you have with your health care provider.   Document Released: 08/06/2001 Document Revised: 07/01/2014 Document Reviewed: 11/05/2010 Elsevier Interactive Patient Education [employer].

## 2016-01-04 NOTE — Progress Notes (Signed)
Pre visit review using our clinic review tool, if applicable. No additional management support is needed unless otherwise documented below in the visit note. 

## 2016-01-04 NOTE — Progress Notes (Signed)
Subjective:   Katherine Owen is a 82 y.o. female who presents for Medicare Annual (Subsequent) preventive examination.  Review of Systems:   Review of Systems  Constitutional: Negative for activity change, appetite change and fatigue.  HENT: Negative for hearing loss, congestion, tinnitus and ear discharge.   Eyes: Negative for visual disturbance (see optho q1y -- vision corrected to 20/20 with glasses).  Respiratory: Negative for cough, chest tightness and shortness of breath.   Cardiovascular: Negative for chest pain, palpitations and leg swelling.  Gastrointestinal: Negative for abdominal pain, diarrhea, constipation and abdominal distention.  Genitourinary: Negative for urgency, frequency, decreased urine volume and difficulty urinating.  Musculoskeletal: walks with walker,  Back pain and shoulder pain Skin: Negative for color change, pallor and rash.  Neurological: Negative for dizziness, light-headedness, numbness and headaches.  Hematological: Negative for adenopathy. Does not bruise/bleed easily.  Psychiatric/Behavioral: Negative for suicidal ideas, confusion, sleep disturbance, self-injury, dysphoric mood, decreased concentration and agitation.  Pt is able to read and write and can do all ADLs Pt at risk for fall --- walker No abuse/ violence in home           Objective:     Vitals: BP 138/78 mmHg  Pulse 92  Temp(Src) 97.9 F (36.6 C) (Oral)  Ht 5\' 5"  (1.651 m)  Wt 142 lb 12.8 oz (64.774 kg)  BMI 23.76 kg/m2  SpO2 98%  Body mass index is 23.76 kg/(m^2). BP 138/78 mmHg  Pulse 92  Temp(Src) 97.9 F (36.6 C) (Oral)  Ht 5\' 5"  (1.651 m)  Wt 142 lb 12.8 oz (64.774 kg)  BMI 23.76 kg/m2  SpO2 98% General appearance: alert, cooperative, appears stated age and no distress Head: Normocephalic, without obvious abnormality, atraumatic Eyes: negative findings: lids and lashes normal, conjunctivae and sclerae normal and pupils equal, round, reactive to light and  accomodation Ears: normal TM's and external ear canals both ears Nose: Nares normal. Septum midline. Mucosa normal. No drainage or sinus tenderness. Throat: lips, mucosa, and tongue normal; teeth and gums normal Neck: no adenopathy, no carotid bruit, no JVD, supple, symmetrical, trachea midline and thyroid not enlarged, symmetric, no tenderness/mass/nodules Back: kyphosis Lungs: clear to auscultation bilaterally Breasts: normal appearance, no masses or tenderness Heart: regular rate and rhythm, S1, S2 normal, no murmur, click, rub or gallop Abdomen: soft, non-tender; bowel sounds normal; no masses,  no organomegaly Pelvic: not indicated; post-menopausal, no abnormal Pap smears in past Extremities: extremities normal, atraumatic, no cyanosis or edema-- b/l shoulder pain  Pulses: 2+ and symmetric Skin: Skin color, texture, turgor normal. No rashes or lesions Lymph nodes: Cervical, supraclavicular, and axillary nodes normal. Neurologic: Alert and oriented X 3, normal strength and tone. Normal symmetric reflexes. Normal coordination and gait  Tobacco History  Smoking status  . Former Smoker -- 1.00 packs/day for 20 years  . Types: Cigarettes  . Quit date: 06/25/2015  Smokeless tobacco  . Never Used     Counseling given: Not Answered   Past Medical History  Diagnosis Date  . Hyperlipidemia   . Hypertension   . Allergy   . Arthritis   . Cataract     Bilateral  . History of shingles    Past Surgical History  Procedure Laterality Date  . Ankle surgery Left 2014  . Colonoscopy    . Eye surgery      cataract  . Spine surgery      done at [facility] 26, 2014, lumbar decompression  . [place] surgery  2015  [facility]   . Total hip arthroplasty Bilateral 2005, 2009    [place] , [place]   Family History  Problem Relation Age of Onset  . Cancer Mother     colon  . Arthritis Mother     osteoarthritis  . Arthritis Father     osteoarthritis  . Parkinson's disease Father   . Anesthesia  problems Neg Hx    History  Sexual Activity  . Sexual Activity: Not Currently    Outpatient Encounter Prescriptions as of 01/04/2016  Medication Sig  . Aspirin-Acetaminophen-Caffeine (EXCEDRIN EXTRA STRENGTH PO) Take 1 tablet by mouth 2 (two) times daily as needed (pain).   Marland Kitchen atorvastatin (LIPITOR) 10 MG tablet Take 1 tablet (10 mg total) by mouth at bedtime. For high cholesterol  . celecoxib (CELEBREX) 200 MG capsule Take 1 capsule (200 mg total) by mouth daily.  . cetirizine (ZYRTEC ALLERGY) 10 MG tablet Take 1 tablet (10 mg total) by mouth at bedtime.  . montelukast (SINGULAIR) 10 MG tablet Take 1 tablet (10 mg total) by mouth at bedtime.  . Multiple Vitamins-Minerals (CENTRUM SILVER) tablet Take 1 tablet by mouth daily.  Marland Kitchen senna-docusate (SENOKOT-S) 8.6-50 MG per tablet Take 1 tablet by mouth 2 (two) times daily. For constipation  . traMADol (ULTRAM) 50 MG tablet Take 1 tablet (50 mg total) by mouth 3 (three) times daily.  . [DISCONTINUED] atorvastatin (LIPITOR) 10 MG tablet Take 1 tablet (10 mg total) by mouth at bedtime. For high cholesterol  . [DISCONTINUED] celecoxib (CELEBREX) 200 MG capsule Take 1 capsule (200 mg total) by mouth daily.  . [DISCONTINUED] cetirizine (ZYRTEC ALLERGY) 10 MG tablet Take 10 mg by mouth at bedtime.  . [DISCONTINUED] montelukast (SINGULAIR) 10 MG tablet Take 1 tablet (10 mg total) by mouth at bedtime.  . [DISCONTINUED] traMADol (ULTRAM) 50 MG tablet Take 1 tablet (50 mg total) by mouth 3 (three) times daily.   No facility-administered encounter medications on file as of 01/04/2016.    Activities of Daily Living In your present state of health, do you have any difficulty performing the following activities: 01/04/2016 12/12/2015  Hearing? N Y  Vision? N N  Difficulty concentrating or making decisions? N N  Walking or climbing stairs? Y Y  Dressing or bathing? N N  Doing errands, shopping? [name]    Patient Care Team: Ann Held, DO as PCP -  General (Family Medicine) Burnell Blanks, MD as Consulting Physician (Cardiology) Justice Britain, MD as Consulting Physician (Orthopedic Surgery)    Assessment:    cpe Exercise Activities and Dietary recommendations Current Exercise Habits: Home exercise routine, Type of exercise: strength training/weights, Time (Minutes): 30, Frequency (Times/Week): 4, Weekly Exercise (Minutes/Week): 120, Intensity: Mild  Goals    None     Fall Risk Fall Risk  01/04/2016 10/24/2015 10/27/2014 06/29/2014  Falls in the past year? No No Yes Yes  Number falls in past yr: - - 1 2 or more  Injury with Fall? - - No No  Risk for fall due to : - - - Impaired balance/gait  Risk for fall due to (comments): - - - Recovery from Knee surgery   Depression Screen PHQ 2/9 Scores 01/04/2016 10/24/2015 10/27/2014 06/29/2014  PHQ - 2 Score 0 0 0 0     Cognitive Testing mmse 30/30  Immunization History  Administered Date(s) Administered  . Influenza,inj,Quad PF,36+ Mos 02/23/2015  . Influenza-Unspecified 05/05/2014  . Pneumococcal Conjugate-13 06/29/2014  . Pneumococcal Polysaccharide-23 06/29/2012  . Td 06/24/2012  Screening Tests Health Maintenance  Topic Date Due  . ZOSTAVAX  03/24/2016 (Originally 11/23/1989)  . INFLUENZA VACCINE  01/23/2016  . DEXA SCAN  11/17/2016  . TETANUS/TDAP  06/24/2022  . PNA vac Low Risk Adult  Completed      Plan:   see AVS During the course of the visit the patient was educated and counseled about the following appropriate screening and preventive services:   Vaccines to include Pneumoccal, Influenza, Hepatitis B, Td, Zostavax, HCV  Electrocardiogram  Cardiovascular Disease  Colorectal cancer screening  Bone density screening  Diabetes screening  Glaucoma screening  Mammography/PAP  Nutrition counseling   Patient Instructions (the written plan) was given to the patient.   [place], DO  01/04/2016

## 2016-01-05 ENCOUNTER — Telehealth: Payer: Self-pay

## 2016-01-05 NOTE — Pre-Procedure Instructions (Signed)
Katherine Owen  01/05/2016      [id number] - [place], [address] [place] [address] Phone: [phone] Fax: [phone]  [facility] [place], [address] [place] [address] Phone: [phone] Fax: [phone]    Your procedure is scheduled on July 20  Report to [facility] at 530 A.M.  Call this number if you have problems the morning of surgery:  [phone]   Remember:  Do not eat food or drink liquids after midnight.  Take these medicines the morning of surgery with A SIP OF WATER Tramadol (ultram) if needed  Stop taking aspirin, BC's, Goody's, Herbal medications, Fish Oil, Ibuprofen, Advil, Motrin, Aleve, Vitamins, Celebrex   Do not wear jewelry, make-up or nail polish.  Do not wear lotions, powders, or perfumes.  You may wear deoderant.  Do not shave 48 hours prior to surgery.  Men may shave face and neck.  Do not bring valuables to the hospital.  [facility] is not responsible for any belongings or valuables.  Contacts, dentures or bridgework may not be worn into surgery.  Leave your suitcase in the car.  After surgery it may be brought to your room.  For patients admitted to the hospital, discharge time will be determined by your treatment team.  Patients discharged the day of surgery will not be allowed to drive home.    Special instructions:  [facility] - Preparing for Surgery  Before surgery, you can play an important role.  Because skin is not sterile, your skin needs to be as free of germs as possible.  You can reduce the number of germs on you skin by washing with CHG (chlorahexidine gluconate) soap before surgery.  CHG is an antiseptic cleaner which kills germs and bonds with the skin to continue killing germs even after washing.  Please DO NOT use if you have an allergy to CHG or antibacterial soaps.   If your skin becomes reddened/irritated stop using the CHG and inform your nurse when you arrive at Short Stay.  Do not shave (including legs and underarms) for at least 48 hours prior to the first CHG shower.  You may shave your face.  Please follow these instructions carefully:   1.  Shower with CHG Soap the night before surgery and the  morning of Surgery.  2.  If you choose to wash your hair, wash your hair first as usual with your normal shampoo.  3.  After you shampoo, rinse your hair and body thoroughly to remove the  Shampoo.  4.  Use CHG as you would any other liquid soap.  You can apply chg directly  to the skin and wash gently with scrungie or a clean washcloth.  5.  Apply the CHG Soap to your body ONLY FROM THE NECK DOWN.   Do not use on open wounds or open sores.  Avoid contact with your eyes, ears, mouth and genitals (private parts).  Wash genitals (private parts)  with your normal soap.  6.  Wash thoroughly, paying special attention to the area where your surgery  will be performed.  7.  Thoroughly rinse your body with warm water from the neck down.  8.  DO NOT shower/wash with your normal soap after using and rinsing off  the CHG Soap.  9.  Pat yourself dry with a clean towel.  10.  Wear clean pajamas.            11.  Place clean sheets on your bed the night of your first shower and do not sleep with pets.  Day of Surgery  Do not apply any lotions/deoderants the morning of surgery.  Please wear clean clothes to the hospital/surgery center.     Please read over the following fact sheets that you were given. Pain Booklet, Coughing and Deep Breathing, MRSA Information and Surgical Site Infection Prevention

## 2016-01-05 NOTE — Telephone Encounter (Signed)
I called the [facility] patient assistance program and reordered the Celebrex. The patient's application will expire on 03/20/16. Confirmation #is [id number] and will be here in 7-10 business days. He son wants to be called at [phone] when the shipment comes in.     [place]

## 2016-01-08 ENCOUNTER — Encounter (HOSPITAL_COMMUNITY)
Admission: RE | Admit: 2016-01-08 | Discharge: 2016-01-08 | Disposition: A | Discharge status: Home / Self Care | Payer: Medicare Other | Source: Ambulatory Visit | Attending: Orthopedic Surgery | Admitting: Orthopedic Surgery

## 2016-01-08 ENCOUNTER — Encounter (HOSPITAL_COMMUNITY): Payer: Self-pay

## 2016-01-08 HISTORY — DX: Headache, unspecified: R51.9

## 2016-01-08 HISTORY — DX: Headache: R51

## 2016-01-08 LAB — BASIC METABOLIC PANEL
Anion gap: 7 (ref 5–15)
BUN: 19 mg/dL (ref 6–20)
CHLORIDE: 105 mmol/L (ref 101–111)
CO2: 29 mmol/L (ref 22–32)
CREATININE: 0.49 mg/dL (ref 0.44–1.00)
Calcium: 10.1 mg/dL (ref 8.9–10.3)
GFR calc non Af Amer: >60 mL/min (ref >60)
Glucose, Bld: 99 mg/dL (ref 65–99)
POTASSIUM: 4.8 mmol/L (ref 3.5–5.1)
SODIUM: 141 mmol/L (ref 135–145)

## 2016-01-08 LAB — CBC
HEMATOCRIT: 38.8 % (ref 36.0–46.0)
HEMOGLOBIN: 12 g/dL (ref 12.0–15.0)
MCH: 26.1 pg (ref 26.0–34.0)
MCHC: 30.9 g/dL (ref 30.0–36.0)
MCV: 84.3 fL (ref 78.0–100.0)
Platelets: 239 10*3/uL (ref 150–400)
RBC: 4.6 MIL/uL (ref 3.87–5.11)
RDW: 14.2 % (ref 11.5–15.5)
WBC: 8 10*3/uL (ref 4.0–10.5)

## 2016-01-08 LAB — SURGICAL PCR SCREEN
MRSA, PCR: NEGATIVE
Staphylococcus aureus: NEGATIVE

## 2016-01-08 NOTE — Progress Notes (Signed)
PCP is Dr. Garnet Koyanagi- [place]  Cardiologist is Dr, Angelena Form Echo noted from 12-20-15 Note from Dr Carollee Herter to refer pt to Cardiology See noted from Dr Angelena Form 12-19-15 Denies any chest pain.

## 2016-01-09 ENCOUNTER — Telehealth: Payer: Self-pay | Admitting: Cardiovascular Disease

## 2016-01-09 NOTE — Telephone Encounter (Signed)
Katherine Owen (son) informed and will pick up medication left in Rx bin at the front desk.

## 2016-01-09 NOTE — Progress Notes (Addendum)
Anesthesia Chart Review:  Pt is an 82 year old female scheduled for reverse R shoulder arthroplasty on 01/11/2016 with Justice Britain, MD.   PCP is Roma Schanz, DO.   PMH includes:  HTN, hyperlipidemia. Former smoker. BMI 24  Medications include: lipitor  Preoperative labs reviewed.    EKG 12/12/15: NSR.   Echo 12/20/15:  - Left ventricle: The cavity size was normal. There was mild focal basal hypertrophy of the septum. The estimated ejection fraction was 55%. Wall motion was normal; there were no regional wall motion abnormalities. Doppler parameters are consistent with abnormal left ventricular relaxation (grade 1 diastolic dysfunction). - Aortic valve: There was no stenosis. - Mitral valve: Mildly calcified annulus. - Right ventricle: The cavity size was normal. Systolic function was normal. - Tricuspid valve: Peak RV-RA gradient (S): 28 mm Hg. - Pulmonary arteries: PA peak pressure: 31 mm Hg (S). - Inferior vena cava: The vessel was normal in size. The respirophasic diameter changes were in the normal range (>= 50%), consistent with normal central venous pressure.  Pt saw Lauree Chandler, MD with cardiology for pre-op eval and was cleared for surgery. However, his note indicates he only reviewed EKG from PCP's office 12/12/15 showing NSR and did not see the one obtained 40 minutes earlier at same PCP visit showing LBBB. Reviewed case with Dr. Conrad Darlington. Reached out to [facility] for input on intermittent LBBB. Dr. Angelena Form is out, so doctor of the day Dr. Irish Lack reviewed EKGs. Pts can go in and out of LBBB if it is rate controlled LBBB. Dr. Irish Lack gave ok to proceed with surgery as long as pt is asymptomatic (no CV sx documented at PAT).  Pt will need further assessment by assigned anesthesiologist DOS.  If no active CV symptoms, I anticipate pt can proceed as scheduled.   Willeen Cass, FNP-BC [facility]/Anesthesiology Phone: [phone] 01/10/2016  12:50 PM

## 2016-01-09 NOTE — Telephone Encounter (Signed)
Spoke with Willeen Cass, NP in regards to pt's surgery coming up on 7/20.  Dr. Angelena Form cleared pt but anesthesia noticed pt had 2 EKG's recently at [facility] office, one showing a LBBB and other showing NSR.  Levada Dy wanted to know if pt could possibly go in and out of LBBB.  Spoke with Dr. Irish Lack, [place], as Dr. Angelena Form is out of town this week.  Dr. Irish Lack states that pt's can go in and out of LBBB if it is rate controlled LBBB.  Dr. Irish Lack states ok for pt to proceed with procedure as long as she is asymptomatic.  Will forward to Willeen Cass, NP to make her aware and Dr. Camillia Herter nurse.

## 2016-01-10 MED ORDER — SODIUM CHLORIDE 0.9 % IV SOLN
1000.0000 mg | INTRAVENOUS | Status: AC
  Administered 2016-01-11: 1000 mg via INTRAVENOUS
  Filled 2016-01-10 (×2): qty 10

## 2016-01-11 ENCOUNTER — Inpatient Hospital Stay (HOSPITAL_COMMUNITY): Payer: Medicare Other | Admitting: Emergency Medicine

## 2016-01-11 ENCOUNTER — Inpatient Hospital Stay (HOSPITAL_COMMUNITY): Payer: Medicare Other | Admitting: Certified Registered Nurse Anesthetist

## 2016-01-11 ENCOUNTER — Inpatient Hospital Stay (HOSPITAL_COMMUNITY)
Admission: RE | Admit: 2016-01-11 | Discharge: 2016-01-14 | DRG: 483 | Disposition: A | Discharge status: Skilled Nursing Facility | Payer: Medicare Other | Source: Ambulatory Visit | Attending: Orthopedic Surgery | Admitting: Orthopedic Surgery

## 2016-01-11 ENCOUNTER — Encounter (HOSPITAL_COMMUNITY)
Admission: RE | Disposition: A | Discharge status: Skilled Nursing Facility | Payer: Self-pay | Source: Ambulatory Visit | Attending: Orthopedic Surgery

## 2016-01-11 ENCOUNTER — Encounter (HOSPITAL_COMMUNITY): Payer: Self-pay | Admitting: General Practice

## 2016-01-11 DIAGNOSIS — M25519 Pain in unspecified shoulder: Secondary | ICD-10-CM | POA: Diagnosis not present

## 2016-01-11 DIAGNOSIS — M25511 Pain in right shoulder: Secondary | ICD-10-CM | POA: Diagnosis not present

## 2016-01-11 DIAGNOSIS — Z96619 Presence of unspecified artificial shoulder joint: Secondary | ICD-10-CM

## 2016-01-11 DIAGNOSIS — G8918 Other acute postprocedural pain: Secondary | ICD-10-CM | POA: Diagnosis not present

## 2016-01-11 DIAGNOSIS — Z96643 Presence of artificial hip joint, bilateral: Secondary | ICD-10-CM | POA: Diagnosis not present

## 2016-01-11 DIAGNOSIS — R2681 Unsteadiness on feet: Secondary | ICD-10-CM | POA: Diagnosis not present

## 2016-01-11 DIAGNOSIS — Z79899 Other long term (current) drug therapy: Secondary | ICD-10-CM

## 2016-01-11 DIAGNOSIS — E785 Hyperlipidemia, unspecified: Secondary | ICD-10-CM | POA: Diagnosis present

## 2016-01-11 DIAGNOSIS — M810 Age-related osteoporosis without current pathological fracture: Secondary | ICD-10-CM | POA: Diagnosis present

## 2016-01-11 DIAGNOSIS — M13811 Other specified arthritis, right shoulder: Secondary | ICD-10-CM | POA: Diagnosis not present

## 2016-01-11 DIAGNOSIS — Z87891 Personal history of nicotine dependence: Secondary | ICD-10-CM

## 2016-01-11 DIAGNOSIS — Z471 Aftercare following joint replacement surgery: Secondary | ICD-10-CM | POA: Diagnosis not present

## 2016-01-11 DIAGNOSIS — M19011 Primary osteoarthritis, right shoulder: Secondary | ICD-10-CM | POA: Diagnosis not present

## 2016-01-11 DIAGNOSIS — I10 Essential (primary) hypertension: Secondary | ICD-10-CM | POA: Diagnosis present

## 2016-01-11 DIAGNOSIS — M12811 Other specific arthropathies, not elsewhere classified, right shoulder: Secondary | ICD-10-CM | POA: Diagnosis not present

## 2016-01-11 DIAGNOSIS — Z96611 Presence of right artificial shoulder joint: Secondary | ICD-10-CM

## 2016-01-11 DIAGNOSIS — M6281 Muscle weakness (generalized): Secondary | ICD-10-CM | POA: Diagnosis not present

## 2016-01-11 DIAGNOSIS — M75101 Unspecified rotator cuff tear or rupture of right shoulder, not specified as traumatic: Secondary | ICD-10-CM | POA: Diagnosis not present

## 2016-01-11 DIAGNOSIS — S46001D Unspecified injury of muscle(s) and tendon(s) of the rotator cuff of right shoulder, subsequent encounter: Secondary | ICD-10-CM | POA: Diagnosis not present

## 2016-01-11 HISTORY — DX: Presence of unspecified artificial shoulder joint: Z96.619

## 2016-01-11 HISTORY — PX: REVERSE SHOULDER ARTHROPLASTY: SHX5054

## 2016-01-11 SURGERY — ARTHROPLASTY, SHOULDER, TOTAL, REVERSE
Anesthesia: Regional | Laterality: Right

## 2016-01-11 MED ORDER — ONDANSETRON HCL 4 MG/2ML IJ SOLN: 4.0000 mg | Freq: Four times a day (QID) | INTRAMUSCULAR | Status: DC | PRN

## 2016-01-11 MED ORDER — ALUM & MAG HYDROXIDE-SIMETH 200-200-20 MG/5ML PO SUSP: 30.0000 mL | ORAL | Status: DC | PRN

## 2016-01-11 MED ORDER — MENTHOL 3 MG MT LOZG: 1.0000 | LOZENGE | OROMUCOSAL | Status: DC | PRN

## 2016-01-11 MED ORDER — PROPOFOL 10 MG/ML IV BOLUS
INTRAVENOUS | Status: DC | PRN
  Administered 2016-01-11: 10 mg via INTRAVENOUS
  Administered 2016-01-11: 130 mg via INTRAVENOUS
  Administered 2016-01-11: 70 mg via INTRAVENOUS

## 2016-01-11 MED ORDER — LACTATED RINGERS IV SOLN
INTRAVENOUS | Status: DC
  Administered 2016-01-12: 02:00:00 via INTRAVENOUS

## 2016-01-11 MED ORDER — ONDANSETRON HCL 4 MG/2ML IJ SOLN: 4.0000 mg | Freq: Once | INTRAMUSCULAR | Status: DC | PRN

## 2016-01-11 MED ORDER — DOCUSATE SODIUM 100 MG PO CAPS
100.0000 mg | ORAL_CAPSULE | Freq: Two times a day (BID) | ORAL | Status: DC
  Administered 2016-01-11 – 2016-01-14 (×7): 100 mg via ORAL
  Filled 2016-01-11 (×7): qty 1

## 2016-01-11 MED ORDER — PHENYLEPHRINE HCL 10 MG/ML IJ SOLN
INTRAMUSCULAR | Status: DC | PRN
  Administered 2016-01-11: 80 ug via INTRAVENOUS
  Administered 2016-01-11: 60 ug via INTRAVENOUS
  Administered 2016-01-11: 40 ug via INTRAVENOUS
  Administered 2016-01-11: 20 ug via INTRAVENOUS

## 2016-01-11 MED ORDER — BISACODYL 5 MG PO TBEC: 5.0000 mg | DELAYED_RELEASE_TABLET | Freq: Every day | ORAL | Status: DC | PRN

## 2016-01-11 MED ORDER — ONDANSETRON HCL 4 MG/2ML IJ SOLN
INTRAMUSCULAR | Status: DC | PRN
  Administered 2016-01-11: 4 mg via INTRAVENOUS

## 2016-01-11 MED ORDER — DEXAMETHASONE SODIUM PHOSPHATE 10 MG/ML IJ SOLN
INTRAMUSCULAR | Status: DC | PRN
  Administered 2016-01-11: 5 mg via INTRAVENOUS

## 2016-01-11 MED ORDER — ONDANSETRON HCL 4 MG PO TABS: 4.0000 mg | ORAL_TABLET | Freq: Four times a day (QID) | ORAL | Status: DC | PRN

## 2016-01-11 MED ORDER — POLYETHYLENE GLYCOL 3350 17 G PO PACK
17.0000 g | PACK | Freq: Every day | ORAL | Status: DC | PRN
  Administered 2016-01-14: 17 g via ORAL
  Filled 2016-01-11: qty 1

## 2016-01-11 MED ORDER — METOCLOPRAMIDE HCL 5 MG PO TABS: 5.0000 mg | ORAL_TABLET | Freq: Three times a day (TID) | ORAL | Status: DC | PRN

## 2016-01-11 MED ORDER — ONDANSETRON HCL 4 MG/2ML IJ SOLN
INTRAMUSCULAR | Status: AC
  Filled 2016-01-11: qty 2

## 2016-01-11 MED ORDER — EPHEDRINE SULFATE 50 MG/ML IJ SOLN
INTRAMUSCULAR | Status: DC | PRN
  Administered 2016-01-11 (×2): 5 mg via INTRAVENOUS

## 2016-01-11 MED ORDER — ROCURONIUM BROMIDE 100 MG/10ML IV SOLN
INTRAVENOUS | Status: DC | PRN
  Administered 2016-01-11: 50 mg via INTRAVENOUS

## 2016-01-11 MED ORDER — LIDOCAINE 2% (20 MG/ML) 5 ML SYRINGE
INTRAMUSCULAR | Status: AC
  Filled 2016-01-11: qty 5

## 2016-01-11 MED ORDER — ATORVASTATIN CALCIUM 10 MG PO TABS
10.0000 mg | ORAL_TABLET | Freq: Every day | ORAL | Status: DC
  Administered 2016-01-11 – 2016-01-13 (×3): 10 mg via ORAL
  Filled 2016-01-11 (×3): qty 1

## 2016-01-11 MED ORDER — CEFAZOLIN SODIUM-DEXTROSE 2-3 GM-% IV SOLR
INTRAVENOUS | Status: DC | PRN
  Administered 2016-01-11: 2 g via INTRAVENOUS

## 2016-01-11 MED ORDER — HYDROMORPHONE HCL 1 MG/ML IJ SOLN
0.5000 mg | INTRAMUSCULAR | Status: DC | PRN
  Administered 2016-01-12: 0.5 mg via INTRAVENOUS
  Filled 2016-01-11: qty 1

## 2016-01-11 MED ORDER — ACETAMINOPHEN 650 MG RE SUPP: 650.0000 mg | Freq: Four times a day (QID) | RECTAL | Status: DC | PRN

## 2016-01-11 MED ORDER — ACETAMINOPHEN 325 MG PO TABS: 650.0000 mg | ORAL_TABLET | Freq: Four times a day (QID) | ORAL | Status: DC | PRN

## 2016-01-11 MED ORDER — CELECOXIB 200 MG PO CAPS
200.0000 mg | ORAL_CAPSULE | Freq: Two times a day (BID) | ORAL | Status: DC
  Administered 2016-01-11 – 2016-01-14 (×7): 200 mg via ORAL
  Filled 2016-01-11 (×7): qty 1

## 2016-01-11 MED ORDER — OXYCODONE HCL 5 MG PO TABS
5.0000 mg | ORAL_TABLET | ORAL | Status: DC | PRN
  Administered 2016-01-11 – 2016-01-14 (×10): 10 mg via ORAL
  Filled 2016-01-11 (×10): qty 2

## 2016-01-11 MED ORDER — SUGAMMADEX SODIUM 200 MG/2ML IV SOLN
INTRAVENOUS | Status: DC | PRN
  Administered 2016-01-11: 120 mg via INTRAVENOUS

## 2016-01-11 MED ORDER — MIDAZOLAM HCL 2 MG/2ML IJ SOLN
INTRAMUSCULAR | Status: AC
  Filled 2016-01-11: qty 2

## 2016-01-11 MED ORDER — CEFAZOLIN SODIUM-DEXTROSE 2-4 GM/100ML-% IV SOLN
2.0000 g | Freq: Four times a day (QID) | INTRAVENOUS | Status: AC
  Administered 2016-01-11 – 2016-01-12 (×3): 2 g via INTRAVENOUS
  Filled 2016-01-11 (×3): qty 100

## 2016-01-11 MED ORDER — ARTIFICIAL TEARS OP OINT
TOPICAL_OINTMENT | OPHTHALMIC | Status: AC
  Filled 2016-01-11: qty 3.5

## 2016-01-11 MED ORDER — DEXAMETHASONE SODIUM PHOSPHATE 10 MG/ML IJ SOLN
INTRAMUSCULAR | Status: AC
  Filled 2016-01-11: qty 1

## 2016-01-11 MED ORDER — FENTANYL CITRATE (PF) 100 MCG/2ML IJ SOLN: 25.0000 ug | INTRAMUSCULAR | Status: DC | PRN

## 2016-01-11 MED ORDER — METOCLOPRAMIDE HCL 5 MG/ML IJ SOLN: 5.0000 mg | Freq: Three times a day (TID) | INTRAMUSCULAR | Status: DC | PRN

## 2016-01-11 MED ORDER — DIAZEPAM 2 MG PO TABS: 2.0000 mg | ORAL_TABLET | Freq: Three times a day (TID) | ORAL | Status: DC | PRN

## 2016-01-11 MED ORDER — PHENYLEPHRINE HCL 10 MG/ML IJ SOLN
10.0000 mg | INTRAVENOUS | Status: DC | PRN
  Administered 2016-01-11: 50 ug/min via INTRAVENOUS

## 2016-01-11 MED ORDER — BUPIVACAINE-EPINEPHRINE (PF) 0.5% -1:200000 IJ SOLN
INTRAMUSCULAR | Status: DC | PRN
  Administered 2016-01-11: 30 mL via PERINEURAL

## 2016-01-11 MED ORDER — LORATADINE 10 MG PO TABS
10.0000 mg | ORAL_TABLET | Freq: Every day | ORAL | Status: DC
  Administered 2016-01-11 – 2016-01-14 (×4): 10 mg via ORAL
  Filled 2016-01-11 (×4): qty 1

## 2016-01-11 MED ORDER — CEFAZOLIN SODIUM 1 G IJ SOLR
INTRAMUSCULAR | Status: AC
  Filled 2016-01-11: qty 20

## 2016-01-11 MED ORDER — PHENOL 1.4 % MT LIQD: 1.0000 | OROMUCOSAL | Status: DC | PRN

## 2016-01-11 MED ORDER — MONTELUKAST SODIUM 10 MG PO TABS
10.0000 mg | ORAL_TABLET | Freq: Every day | ORAL | Status: DC
  Administered 2016-01-11 – 2016-01-13 (×3): 10 mg via ORAL
  Filled 2016-01-11 (×3): qty 1

## 2016-01-11 MED ORDER — FENTANYL CITRATE (PF) 250 MCG/5ML IJ SOLN
INTRAMUSCULAR | Status: AC
  Filled 2016-01-11: qty 5

## 2016-01-11 MED ORDER — LACTATED RINGERS IV SOLN
INTRAVENOUS | Status: DC | PRN
  Administered 2016-01-11 (×2): via INTRAVENOUS

## 2016-01-11 MED ORDER — SODIUM CHLORIDE 0.9 % IR SOLN
Status: DC | PRN
  Administered 2016-01-11: 3000 mL

## 2016-01-11 MED ORDER — MAGNESIUM CITRATE PO SOLN: 1.0000 | Freq: Once | ORAL | Status: DC | PRN

## 2016-01-11 MED ORDER — PROPOFOL 10 MG/ML IV BOLUS
INTRAVENOUS | Status: AC
  Filled 2016-01-11: qty 40

## 2016-01-11 MED ORDER — 0.9 % SODIUM CHLORIDE (POUR BTL) OPTIME
TOPICAL | Status: DC | PRN
  Administered 2016-01-11: 1000 mL

## 2016-01-11 MED ORDER — TRAMADOL HCL 50 MG PO TABS: 50.0000 mg | ORAL_TABLET | Freq: Four times a day (QID) | ORAL | Status: DC | PRN

## 2016-01-11 MED ORDER — FENTANYL CITRATE (PF) 100 MCG/2ML IJ SOLN
INTRAMUSCULAR | Status: DC | PRN
  Administered 2016-01-11 (×2): 50 ug via INTRAVENOUS

## 2016-01-11 SURGICAL SUPPLY — 64 items
BASEPLATE GLENOID SHLDR SM (Shoulder) ×3 IMPLANT
BLADE SAW SGTL 83.5X18.5 (BLADE) ×3 IMPLANT
COVER SURGICAL LIGHT HANDLE (MISCELLANEOUS) ×3 IMPLANT
CUP SUT UNIV REVERS 36 NEUTRAL (Cup) ×3 IMPLANT
DERMABOND ADVANCED (GAUZE/BANDAGES/DRESSINGS) ×2
DERMABOND ADVANCED .7 DNX12 (GAUZE/BANDAGES/DRESSINGS) ×1 IMPLANT
DRAPE ORTHO SPLIT 77X108 STRL (DRAPES) ×4
DRAPE SURG 17X11 SM STRL (DRAPES) ×3 IMPLANT
DRAPE SURG ORHT 6 SPLT 77X108 (DRAPES) ×2 IMPLANT
DRAPE U-SHAPE 47X51 STRL (DRAPES) ×3 IMPLANT
DRSG AQUACEL AG ADV 3.5X10 (GAUZE/BANDAGES/DRESSINGS) ×3 IMPLANT
DRSG AQUACEL AG ADV 3.5X14 (GAUZE/BANDAGES/DRESSINGS) ×3 IMPLANT
DURAPREP 26ML APPLICATOR (WOUND CARE) ×3 IMPLANT
ELECT BLADE 4.0 EZ CLEAN MEGAD (MISCELLANEOUS) ×3
ELECT CAUTERY BLADE 6.4 (BLADE) ×3 IMPLANT
ELECT REM PT RETURN 9FT ADLT (ELECTROSURGICAL) ×3
ELECTRODE BLDE 4.0 EZ CLN MEGD (MISCELLANEOUS) ×1 IMPLANT
ELECTRODE REM PT RTRN 9FT ADLT (ELECTROSURGICAL) ×1 IMPLANT
FACESHIELD WRAPAROUND (MASK) ×9 IMPLANT
GLENOSPHERE LATERAL 36MM+4 (Shoulder) ×3 IMPLANT
GLOVE BIO SURGEON STRL SZ7.5 (GLOVE) ×3 IMPLANT
GLOVE BIO SURGEON STRL SZ8 (GLOVE) ×3 IMPLANT
GLOVE EUDERMIC 7 POWDERFREE (GLOVE) ×3 IMPLANT
GLOVE SS BIOGEL STRL SZ 7.5 (GLOVE) ×1 IMPLANT
GLOVE SUPERSENSE BIOGEL SZ 7.5 (GLOVE) ×2
GOWN STRL REUS W/ TWL LRG LVL3 (GOWN DISPOSABLE) IMPLANT
GOWN STRL REUS W/ TWL XL LVL3 (GOWN DISPOSABLE) ×2 IMPLANT
GOWN STRL REUS W/TWL LRG LVL3 (GOWN DISPOSABLE)
GOWN STRL REUS W/TWL XL LVL3 (GOWN DISPOSABLE) ×4
KIT BASIN OR (CUSTOM PROCEDURE TRAY) ×3 IMPLANT
KIT ROOM TURNOVER OR (KITS) ×3 IMPLANT
LINER HUMERAL 36 +3MM SM (Shoulder) ×3 IMPLANT
MANIFOLD NEPTUNE II (INSTRUMENTS) ×3 IMPLANT
NEEDLE 1/2 CIR CATGUT .05X1.09 (NEEDLE) ×3 IMPLANT
NEEDLE HYPO 25GX1X1/2 BEV (NEEDLE) IMPLANT
NS IRRIG 1000ML POUR BTL (IV SOLUTION) ×3 IMPLANT
PACK SHOULDER (CUSTOM PROCEDURE TRAY) ×3 IMPLANT
PAD ARMBOARD 7.5X6 YLW CONV (MISCELLANEOUS) ×6 IMPLANT
PASSER SUT SWANSON 36MM LOOP (INSTRUMENTS) IMPLANT
RESTRAINT HEAD UNIVERSAL NS (MISCELLANEOUS) ×3 IMPLANT
SCREW CENTRAL NONLOCK 6.5X20MM (Shoulder) ×3 IMPLANT
SCREW LOCK GLENOID UNI PERI (Screw) ×3 IMPLANT
SCREW LOCK PERIPHERAL 30MM (Shoulder) ×3 IMPLANT
SET PIN UNIVERSAL REVERSE (SET/KITS/TRAYS/PACK) ×3 IMPLANT
SLEEVE CABLE 2MM VT (Orthopedic Implant) ×3 IMPLANT
SLING ARM FOAM STRAP LRG (SOFTGOODS) IMPLANT
SPONGE LAP 18X18 X RAY DECT (DISPOSABLE) ×3 IMPLANT
SPONGE LAP 4X18 X RAY DECT (DISPOSABLE) ×3 IMPLANT
STEM REVERS UNI SZ6 CAP COATED (Shoulder) ×3 IMPLANT
SUCTION FRAZIER HANDLE 10FR (MISCELLANEOUS) ×2
SUCTION TUBE FRAZIER 10FR DISP (MISCELLANEOUS) ×1 IMPLANT
SUT BONE WAX W31G (SUTURE) IMPLANT
SUT FIBERWIRE #2 38 T-5 BLUE (SUTURE) ×6
SUT MNCRL AB 3-0 PS2 18 (SUTURE) ×3 IMPLANT
SUT MON AB 2-0 CT1 36 (SUTURE) ×3 IMPLANT
SUT VIC AB 1 CT1 27 (SUTURE) ×2
SUT VIC AB 1 CT1 27XBRD ANBCTR (SUTURE) ×1 IMPLANT
SUT VIC AB 2-0 CT1 27 (SUTURE)
SUT VIC AB 2-0 CT1 TAPERPNT 27 (SUTURE) IMPLANT
SUTURE FIBERWR #2 38 T-5 BLUE (SUTURE) ×2 IMPLANT
SYR CONTROL 10ML LL (SYRINGE) IMPLANT
TOWEL OR 17X24 6PK STRL BLUE (TOWEL DISPOSABLE) ×3 IMPLANT
TOWEL OR 17X26 10 PK STRL BLUE (TOWEL DISPOSABLE) ×3 IMPLANT
WATER STERILE IRR 1000ML POUR (IV SOLUTION) ×3 IMPLANT

## 2016-01-11 NOTE — Op Note (Signed)
01/11/2016  9:20 AM  PATIENT:   Katherine Owen  82 y.o. female  PRE-OPERATIVE DIAGNOSIS:  RIGHT SHOULDER ROTATOR CUFF ARTHROPATHY  POST-OPERATIVE DIAGNOSIS:  same  PROCEDURE:  Right reverse shoulder arthroplasty  SURGEON:  Marcanthony Sleight, Metta Clines. M.D.  ASSISTANTS: Shuford pac   ANESTHESIA:   GET + ISB  EBL: 150  SPECIMEN:  none  Drains: none   PATIENT DISPOSITION:  PACU - hemodynamically stable.    PLAN OF CARE: Admit to inpatient    Post op plan for short term SNF  Dictation# [id number]   Contact # [phone]

## 2016-01-11 NOTE — Anesthesia Procedure Notes (Addendum)
Anesthesia Regional Block:  Interscalene brachial plexus block  Pre-Anesthetic Checklist: ,, timeout performed, Correct Patient, Correct Site, Correct Laterality, Correct Procedure, Correct Position, site marked, Risks and benefits discussed,  Surgical consent,  Pre-op evaluation,  At surgeon's request and post-op pain management  Laterality: Right  Prep: chloraprep       Needles:  Injection technique: Single-shot  Needle Type: Echogenic Stimulator Needle     Needle Length: 10cm 10 cm Needle Gauge: 21 and 21 G    Additional Needles:  Procedures: ultrasound guided (picture in chart) and nerve stimulator Interscalene brachial plexus block  Nerve Stimulator or Paresthesia:  Response: 0.4 mA,   Additional Responses:   Narrative:  Start time: 01/11/2016 7:10 AM End time: 01/11/2016 7:20 AM Injection made incrementally with aspirations every 5 mL.  Performed by: Personally  Anesthesiologist: Lillia Abed  Additional Notes: Monitors applied. Patient sedated. Sterile prep and drape,hand hygiene and sterile gloves were used. Relevant anatomy identified.Needle position confirmed.Local anesthetic injected incrementally after negative aspiration. Local anesthetic spread visualized around nerve(s). Vascular puncture avoided. No complications. Image printed for medical record.The patient tolerated the procedure well.        Procedure Name: Intubation Date/Time: 01/11/2016 7:40 AM Performed by: Judeth Cornfield T Pre-anesthesia Checklist: Patient identified, Emergency Drugs available, Suction available and Patient being monitored Patient Re-evaluated:Patient Re-evaluated prior to inductionOxygen Delivery Method: Circle System Utilized Preoxygenation: Pre-oxygenation with 100% oxygen Intubation Type: IV induction Ventilation: Mask ventilation without difficulty and Oral airway inserted - appropriate to patient size Laryngoscope Size: Mac and 3 Grade View: Grade I Tube type:  Oral Tube size: 7.0 mm Number of attempts: 1 Airway Equipment and Method: Stylet and Oral airway Placement Confirmation: ETT inserted through vocal cords under direct vision,  positive ETCO2 and breath sounds checked- equal and bilateral Secured at: 21 cm Tube secured with: Tape Dental Injury: Teeth and Oropharynx as per pre-operative assessment

## 2016-01-11 NOTE — Progress Notes (Signed)
Care of pt assumed by MA Quenten Nawaz RN 

## 2016-01-11 NOTE — H&P (Signed)
Katherine Owen    Chief Complaint: RIGHT SHOULDER ROTATOR CUFF ARTHROPATHY HPI: The patient is a 82 y.o. female with end stage right shoulder rotator cuff tear arthropathy  Past Medical History  Diagnosis Date  . Hyperlipidemia   . Hypertension   . Allergy   . Arthritis   . Cataract     Bilateral  . History of shingles   . Headache     Past Surgical History  Procedure Laterality Date  . Ankle surgery Left 2014  . Colonoscopy    . Spine surgery      done at [facility] Apr 26, 2014, lumbar decompression  . [place]  2015    [place]   . Total hip arthroplasty Bilateral 2005, 2009    [place] , [place]  . Bunionectomy Right   . Eye surgery      cataract    Family History  Problem Relation Age of Onset  . Cancer Mother     colon  . Arthritis Mother     osteoarthritis  . Arthritis Father     osteoarthritis  . Parkinson's disease Father   . Anesthesia problems Neg Hx     Social History:  reports that she quit smoking about 6 months ago. Her smoking use included Cigarettes. She has a 20 pack-year smoking history. She has never used smokeless tobacco. She reports that she does not drink alcohol or use illicit drugs.   Medications Prior to Admission  Medication Sig Dispense Refill  . Aspirin-Acetaminophen-Caffeine (EXCEDRIN EXTRA STRENGTH PO) Take 1 tablet by mouth 2 (two) times daily as needed (pain).     Marland Kitchen atorvastatin (LIPITOR) 10 MG tablet Take 1 tablet (10 mg total) by mouth at bedtime. For high cholesterol 90 tablet 1  . celecoxib (CELEBREX) 200 MG capsule Take 1 capsule (200 mg total) by mouth daily. 100 capsule 0  . cetirizine (ZYRTEC ALLERGY) 10 MG tablet Take 1 tablet (10 mg total) by mouth at bedtime.    . montelukast (SINGULAIR) 10 MG tablet Take 1 tablet (10 mg total) by mouth at bedtime. 90 tablet 3  . Multiple Vitamins-Minerals (CENTRUM SILVER) tablet Take 1 tablet by mouth daily.    Marland Kitchen senna-docusate (SENOKOT-S) 8.6-50 MG per tablet Take 1 tablet by mouth 2 (two)  times daily. For constipation    . traMADol (ULTRAM) 50 MG tablet Take 1 tablet (50 mg total) by mouth 3 (three) times daily. 270 tablet 1     Physical Exam: right shoulder with painful and restricted motion as noted at recent office visits  Vitals  Temp:  [98.8 F (37.1 C)] 98.8 F (37.1 C) (07/20 0604) Pulse Rate:  [81] 81 (07/20 0604) Resp:  [18] 18 (07/20 0604) BP: (107)/(65) 107/65 mmHg (07/20 0604) SpO2:  [98 %] 98 % (07/20 0604) Weight:  [64.411 kg (142 lb)] 64.411 kg (142 lb) (07/20 0604)  Assessment/Plan  Impression: RIGHT SHOULDER ROTATOR CUFF ARTHROPATHY  Plan of Action: Procedure(s): REVERSE SHOULDER ARTHROPLASTY  Harris Kistler M Aubriee Szeto 01/11/2016, 7:25 AM Contact # [phone]

## 2016-01-11 NOTE — Anesthesia Preprocedure Evaluation (Signed)
Anesthesia Evaluation  Patient identified by MRN, date of birth, ID band Patient awake    Reviewed: Allergy & Precautions, H&P , NPO status , Patient's Chart, lab work & pertinent test results  History of Anesthesia Complications Negative for: history of anesthetic complications  Airway Mallampati: II  TM Distance: >3 FB Neck ROM: full    Dental no notable dental hx.    Pulmonary former smoker,    Pulmonary exam normal breath sounds clear to auscultation       Cardiovascular hypertension, Normal cardiovascular exam Rhythm:regular Rate:Normal     Neuro/Psych  Headaches,    GI/Hepatic negative GI ROS, Neg liver ROS,   Endo/Other  negative endocrine ROS  Renal/GU negative Renal ROS     Musculoskeletal  (+) Arthritis ,   Abdominal   Peds  Hematology negative hematology ROS (+)   Anesthesia Other Findings   Reproductive/Obstetrics negative OB ROS                             Anesthesia Physical Anesthesia Plan  ASA: II  Anesthesia Plan: General and Regional   Post-op Pain Management: GA combined w/ Regional for post-op pain   Induction: Intravenous  Airway Management Planned: Oral ETT  Additional Equipment:   Intra-op Plan:   Post-operative Plan: Extubation in OR  Informed Consent: I have reviewed the patients History and Physical, chart, labs and discussed the procedure including the risks, benefits and alternatives for the proposed anesthesia with the patient or authorized representative who has indicated his/her understanding and acceptance.   Dental Advisory Given  Plan Discussed with: Anesthesiologist, CRNA and Surgeon  Anesthesia Plan Comments:         Anesthesia Quick Evaluation

## 2016-01-11 NOTE — Op Note (Signed)
Katherine Owen, Katherine Owen             ACCOUNT NO.:  [id number]  MEDICAL RECORD NO.:  [id number]  LOCATION:  5N08C                        FACILITY:  [facility]  PHYSICIAN:  Metta Clines. Zadaya Cuadra, M.D.  DATE OF BIRTH:  1928/05/28  DATE OF PROCEDURE:  01/11/2016 DATE OF DISCHARGE:                              OPERATIVE REPORT   PREOPERATIVE DIAGNOSIS:  End-stage right shoulder rotator cuff tear arthropathy.  POSTOPERATIVE DIAGNOSIS:  End-stage right shoulder rotator cuff tear arthropathy.  PROCEDURES:  Right shoulder reverse arthroplasty utilizing a press-fit size 6 Arthrex stem and a 36 size metaphysis, +3 poly, a small baseplate and a QA348G 4 glenosphere.  SURGEON:  Metta Clines. Horst Ostermiller, M.D.  Terrence DupontOlivia Mackie A. Shuford, P.A.-C.  ANESTHESIA:  General endotracheal as well as an interscalene block.  ESTIMATED BLOOD LOSS:  150 mL.  DRAINS:  None.  HISTORY:  Ms. Tenenbaum is an 82 year old female, who has had chronic and progressively increasing right shoulder pain with increasing functional limitations, which is severely impacting her quality of life.  She demonstrates profound loss of mobility, strength and function of the right upper extremity and clinically with radiographs confirming end- stage rotator cuff tear arthropathy.  Due to her severe and increasing pain and associated functional limitation, she was brought to the operating room at this time for planned right reverse shoulder arthroplasty.  Preoperatively, I counseled Ms. Claybon Jabs regarding treatment options, potential risks versus benefits thereof.  Possible surgical complications were reviewed including bleeding, infection, neurovascular injury, persistent pain, loss of motion, anesthetic complication, failure of the implant and possible need for additional surgery.  She understands and accepts and agrees with the planned procedure.  PROCEDURE IN DETAIL:  After undergoing routine preop evaluation, the patient received  prophylactic antibiotics and interscalene block was established in the holding area by the Anesthesia Department.  Placed supine on the operating table and underwent smooth induction of a general endotracheal anesthesia.  Placed in the beach-chair position and appropriately padded and protected.  The right shoulder griddle region was sterilely prepped and draped in standard fashion.  Time-out was called.  An anterior deltopectoral approach was made to the right shoulder through an 8-cm incision.  Skin flaps were elevated. Electrocautery was used for hemostasis.  Dissection carried deeply, deltopectoral interval was developed from proximal to distal with the vein taken laterally and the upper centimeter of the pec major was tenotomized to enhance exposure.  We then tenotomized the remnant to the long head biceps tendon, which appeared to be markedly degenerated.  The subscapularis was then divided away from its attachment to lesser tuberosity and then was mobilized circumferentially and free margin was tagged with a pair of figure-of-eight #2 FiberWire sutures.  Some residual remnant of the rotator cuff superiorly was excised.  Capsular attachments were then divided anteroinferiorly and inferiorly to allow complete delivery of the humeral head through the wound.  I then outlined a proposed humeral head resection of 135 degrees angle at approximately 10 degrees of retroversion.  This was performed with an oscillating saw.  At this point, a metal cap was then placed over the cut surface of the proximal humerus for protection and then, we exposed the glenoid using a combination  of Fukuda, pitchfork, and snake tongue retractors.  Confirmed mobilization of subscapularis and performed a circumferential labral resection including removal of the proximal stump of the long head biceps tendon.  There was a prominent osteophyte at inferior margin of the glenoid, which was removed with a rongeur.   At this time, a guidepin was placed into the center of the glenoid and then we reamed the glenoid with our central reamer followed by our peripheral reamer and it gained good exposure of subchondral bone.  Small baseplate was then impacted.  The central lag screw was placed followed by the peripheral locking screws and excellent bony fixation was achieved all round.  We then used our peripheral reamer to remove residual soft tissue and bone of the margin and margins of our glenoid baseplate.  The area was irrigated.  We then impacted the 36+ 4 glenosphere under our baseplate with excellent fit and fixation.  At this point, we then returned our attention to the proximal humerus where we performed hand reaming up to size 7, broached it up to size 6, which showed good purchase and fixation.  Of note, there was some mild calcar deficiency medially due to the underlying osteoporosis and as a precautionary measure, I placed a single Dall-Miles cable at the metaphyseal level of the proximal humerus to provide support and prevent any propagation of the cortical defect.  Once this was placed and properly tensioned and secured, we then performed final broaching and preparation of the metaphysis with a 36 reamer.  Trial reduction showed good fit and soft tissue balance.  At this point, then, we removed the trials, our final implant was assembled on the back table, which was the 6 stem with a neutral stem on a 36 metaphysis, which I assembled and then, we impacted this into the humeral shaft with excellent fit and fixation.  We then performed once again trial reduction, the +3 poly showed excellent soft tissue balance and fit.  The final +3 poly was then impacted.  The joint was irrigated.  Final reduction was performed.  We had overall excellent soft tissue balance, good shoulder motion and stability.  At this point, the subscapularis was then repaired back through the eyelets on the metaphyseal  margin of ours implant and used to repair the subscapularis back over the lesser tuberosity region.  Final irrigation was completed. Hemostasis was obtained.  The deltopectoral interval was then reapproximated with a series of figure-of-eight #1 Vicryl sutures.  2-0 Monocryl was used for the subcu layer, intracuticular 3-0 Monocryl for the skin followed by Dermabond and Aquacel dressing.  The right arm was placed into a sling and the patient was then awakened, extubated and taken to the recovery room in stable condition.  Jenetta Loges, PA-C was used as an Environmental consultant throughout this case, was essential for help with positioning the patient, positioning the extremity, manipulation of soft tissues, implantation of prosthesis, wound closer and intraoperative decision making.     Metta Clines. Orvie Caradine, M.D.     KMS/MEDQ  D:  01/11/2016  T:  01/11/2016  Job:  [id number]

## 2016-01-11 NOTE — Transfer of Care (Signed)
Immediate Anesthesia Transfer of Care Note  Patient: Katherine Owen  Procedure(s) Performed: Procedure(s): REVERSE SHOULDER ARTHROPLASTY (Right)  Patient Location: PACU  Anesthesia Type:General and Regional  Level of Consciousness: awake, alert , oriented, patient cooperative and responds to stimulation  Airway & Oxygen Therapy: Patient Spontanous Breathing and Patient connected to nasal cannula oxygen  Post-op Assessment: Report given to RN and Post -op Vital signs reviewed and stable  Post vital signs: Reviewed and stable  Last Vitals:  Filed Vitals:   01/11/16 0935 01/11/16 0937  BP:  116/93  Pulse:  87  Temp: 36.4 C   Resp:  20    Last Pain:  Filed Vitals:   01/11/16 0950  PainSc: 3       Patients Stated Pain Goal: 3 ([id number])  Complications: No apparent anesthesia complications

## 2016-01-11 NOTE — Anesthesia Postprocedure Evaluation (Signed)
Anesthesia Post Note  Patient: Katherine Owen  Procedure(s) Performed: Procedure(s) (LRB): REVERSE SHOULDER ARTHROPLASTY (Right)  Patient location during evaluation: PACU Anesthesia Type: General and Regional Level of consciousness: awake and alert Pain management: pain level controlled Vital Signs Assessment: post-procedure vital signs reviewed and stable Respiratory status: spontaneous breathing, nonlabored ventilation, respiratory function stable and patient connected to nasal cannula oxygen Cardiovascular status: blood pressure returned to baseline and stable Postop Assessment: no signs of nausea or vomiting Anesthetic complications: no    Last Vitals:  Filed Vitals:   01/11/16 1045 01/11/16 1107  BP:  129/67  Pulse: 61 57  Temp:    Resp: 12 11    Last Pain:  Filed Vitals:   01/11/16 1108  PainSc: 0-No pain                 Zenaida Deed

## 2016-01-12 ENCOUNTER — Encounter (HOSPITAL_COMMUNITY): Payer: Self-pay | Admitting: General Practice

## 2016-01-12 MED ORDER — ONDANSETRON HCL 4 MG PO TABS: 4.0000 mg | ORAL_TABLET | Freq: Three times a day (TID) | ORAL | Status: DC | PRN

## 2016-01-12 MED ORDER — OXYCODONE-ACETAMINOPHEN 5-325 MG PO TABS: 1.0000 | ORAL_TABLET | ORAL | Status: DC | PRN

## 2016-01-12 MED ORDER — DIAZEPAM 2 MG PO TABS: 2.0000 mg | ORAL_TABLET | Freq: Four times a day (QID) | ORAL | Status: DC | PRN

## 2016-01-12 NOTE — Clinical Social Work Note (Signed)
Clinical Social Work Assessment  Patient Details  Name: Katherine Owen MRN: [id number] Date of Birth: May 30, 1928  Date of referral:  01/12/16               Reason for consult:  Facility Placement                Permission sought to share information with:  Chartered certified accountant granted to share information::  Yes, Verbal Permission Granted  Name::        Agency::   ([facility])  Relationship::     Contact Information:     Housing/Transportation Living arrangements for the past 2 months:  Single Family Home Source of Information:  Patient Patient Interpreter Needed:  None Criminal Activity/Legal Involvement Pertinent to Current Situation/Hospitalization:    Significant Relationships:    Lives with:    Do you feel safe going back to the place where you live?    Need for family participation in patient care:  No (Coment)  Care giving concerns:  No caregivers present at time of assessment.   Social Worker assessment / plan:  CSW spoke with patient regarding discharge plans. Patient states she is orignially from [place] and was here in 2015 staying with her son while she underwent back surgery at [facility]  Since she has moved here and is living alone.  Patient states she will need [facility] transportation.  CSW educated patient on the possibility that insurance may not cover EMS transportation for a shoulder.  Patient acknowledges understanding and adamant in regards to EMS transportation.   Employment status:  Retired Forensic scientist:    PT Recommendations:  [place] / Referral to community resources:  [place]  Patient/Family's Response to care:  Patient is agreeable  Patient/Family's Understanding of and Emotional Response to Diagnosis, Current Treatment, and Prognosis:  Patient is very understanding of her current medical condition and surgery including recovery and therapies needed.  Emotional  Assessment Appearance:  Appears stated age Attitude/Demeanor/Rapport:    Affect (typically observed):  Accepting, Adaptable Orientation:  Oriented to Self, Oriented to Place, Oriented to  Time, Oriented to Situation Alcohol / Substance use:  Not Applicable Psych involvement (Current and /or in the community):  No (Comment)  Discharge Needs  Concerns to be addressed:  No discharge needs identified Readmission within the last 30 days:  No Current discharge risk:  None Barriers to Discharge:  Continued Medical Work up   [employer], LCSW 01/12/2016, 11:26 AM

## 2016-01-12 NOTE — Discharge Summary (Addendum)
PATIENT ID:      Katherine Owen  MRN:     [id number] DOB/AGE:    02-13-1928 / 82 y.o.     DISCHARGE SUMMARY  ADMISSION DATE:    01/11/2016 DISCHARGE DATE:  01/14/16  ADMISSION DIAGNOSIS: RIGHT SHOULDER ROTATOR CUFF ARTHROPATHY Past Medical History  Diagnosis Date  . Hyperlipidemia   . Hypertension   . Allergy   . Arthritis   . Cataract     Bilateral  . History of shingles   . Headache     DISCHARGE DIAGNOSIS:   Active Problems:   S/p reverse total shoulder arthroplasty   PROCEDURE: Procedure(s): REVERSE SHOULDER ARTHROPLASTY on 01/11/2016  CONSULTS:     HISTORY:  See H&P in chart.  HOSPITAL COURSE:  Katherine Owen is a 82 y.o. admitted on 01/11/2016 with a diagnosis of RIGHT SHOULDER ROTATOR CUFF ARTHROPATHY.  They were brought to the operating room on 01/11/2016 and underwent Procedure(s): [place].    They were given perioperative antibiotics: Anti-infectives    Start     Dose/Rate Route Frequency Ordered Stop   01/11/16 1530  ceFAZolin (ANCEF) IVPB 2g/100 mL premix     2 g 200 mL/hr over 30 Minutes Intravenous Every 6 hours 01/11/16 1523 01/12/16 0410    .  Patient underwent the above named procedure and tolerated it well. The following day they were hemodynamically stable and pain was controlled on oral analgesics. They were neurovascularly intact to the operative extremity. OT was ordered and worked with patient per protocol. They were medically and orthopaedically stable for discharge on date above.   Social work helped to arrange a skilled stay at [facility] as the patient had recovered there from previous back surgery.   DIAGNOSTIC STUDIES:  RECENT RADIOGRAPHIC STUDIES :  No results found.  RECENT VITAL SIGNS:  Patient Vitals for the past 24 hrs:  BP Temp Temp src Pulse Resp SpO2  01/12/16 0424 (!) 94/52 mmHg 97.4 F (36.3 C) Oral 81 16 97 %  01/12/16 0003 (!) 97/39 mmHg 97.7 F (36.5 C) Oral 73 16 98 %  01/11/16 1950 (!) 104/56  mmHg 97.7 F (36.5 C) Oral 74 16 96 %  01/11/16 1511 110/77 mmHg 97.9 F (36.6 C) Oral 65 16 95 %  01/11/16 1445 (!) 116/57 mmHg 97.7 F (36.5 C) - 60 17 100 %  01/11/16 1408 (!) 113/54 mmHg - - (!) 58 13 100 %  01/11/16 1245 - - - (!) 57 13 100 %  01/11/16 1215 - - - (!) 58 13 100 %  01/11/16 1207 117/88 mmHg - - - - -  01/11/16 1145 - - - 64 14 100 %  01/11/16 1137 101/82 mmHg - - - - -  01/11/16 1107 129/67 mmHg - - (!) 57 11 100 %  01/11/16 1045 - - - 61 12 100 %  01/11/16 1037 (!) 144/80 mmHg - - - - -  01/11/16 1030 - - - 63 14 100 %  01/11/16 1022 140/68 mmHg - - - - -  01/11/16 1015 - - - 62 17 100 %  01/11/16 1007 (!) 115/93 mmHg - - - - -  01/11/16 1000 - - - 62 13 96 %  01/11/16 0952 140/74 mmHg - - - - -  01/11/16 0937 (!) 116/93 mmHg - - 87 20 98 %  01/11/16 0935 - 97.5 F (36.4 C) - (!) 101 16 95 %  .  RECENT EKG RESULTS:    Orders placed  or performed in visit on 12/12/15  . EKG 12-Lead  . EKG 12-Lead    DISCHARGE INSTRUCTIONS:  OT orders   OT PT for Supple reverse shoulder protocol. Discharge Instructions    Discontinue IV    Complete by:  As directed            DISCHARGE MEDICATIONS:     Medication List    TAKE these medications        atorvastatin 10 MG tablet  Commonly known as:  LIPITOR  Take 1 tablet (10 mg total) by mouth at bedtime. For high cholesterol     celecoxib 200 MG capsule  Commonly known as:  CELEBREX  Take 1 capsule (200 mg total) by mouth daily.     CENTRUM SILVER tablet  Take 1 tablet by mouth daily.     cetirizine 10 MG tablet  Commonly known as:  ZYRTEC ALLERGY  Take 1 tablet (10 mg total) by mouth at bedtime.     diazepam 2 MG tablet  Commonly known as:  VALIUM  Take 1 tablet (2 mg total) by mouth every 6 (six) hours as needed for anxiety.     EXCEDRIN EXTRA STRENGTH PO  Take 1 tablet by mouth 2 (two) times daily as needed (pain).     montelukast 10 MG tablet  Commonly known as:  SINGULAIR  Take 1 tablet  (10 mg total) by mouth at bedtime.     ondansetron 4 MG tablet  Commonly known as:  ZOFRAN  Take 1 tablet (4 mg total) by mouth every 8 (eight) hours as needed for nausea or vomiting.     oxyCODONE-acetaminophen 5-325 MG tablet  Commonly known as:  PERCOCET  Take 1-2 tablets by mouth every 4 (four) hours as needed.     senna-docusate 8.6-50 MG tablet  Commonly known as:  Senokot-S  Take 1 tablet by mouth 2 (two) times daily. For constipation     traMADol 50 MG tablet  Commonly known as:  ULTRAM  Take 1 tablet (50 mg total) by mouth 3 (three) times daily.        FOLLOW UP VISIT:       Follow-up Information    Follow up with Metta Clines SUPPLE, MD.   Specialty:  Orthopedic Surgery   Why:  call to be seen in 10-14days   Contact information:   [address] [place] [address] [place] [address] [phone]       DISCHARGE TO: Skilled  DISPOSITION: Good  DISCHARGE CONDITION:  Festus Barren for Dr. Justice Britain 01/12/2016, 8:31 AM

## 2016-01-12 NOTE — Evaluation (Signed)
Physical Therapy Evaluation Patient Details Name: Katherine Owen MRN: [id number] DOB: September 18, 1927 Today's Date: 01/12/2016   History of Present Illness  Pt is a 82 y/o female s/p R reverse TSA. PMH including but not limited to bilateral THA (2005, 2009), L ankle surgery (2014), and lumbar decompression (2015).    Clinical Impression  Pt presented sitting upright in recliner when PT entered room. Pt stated that prior to admission, she was using a manual w/c for mobility and was able to perform stand-pivot transfers from her w/c to toilet and bed with use of bilateral UEs on stable surfaces. She reported that she has not ambulated in several months. During the evaluation, pt stated that she was too fatigued and having too much pain to attempt to stand or perform any transfers. Pt does demonstrate greater weakness in her R LE as compared to her L LE, which she attributes to a "pinched nerve". Pt would continue to benefit from skilled physical therapy services at this time while admitted and after d/c to address her below listed limitations in order to improve her safety and independence with functional mobility.      Follow Up Recommendations SNF    Equipment Recommendations  None recommended by PT    Recommendations for Other Services       Precautions / Restrictions Precautions: Shoulder Weight bearing restrictions: NWB R UE      Mobility  Bed Mobility     General bed mobility comments: pt sitting OOB in recliner when PT entered room.  Transfers General transfer comment: pt stated that she was too fatigued and having too much pain to perform transfers during evaluation session.  Ambulation/Gait                Stairs            Wheelchair Mobility    Modified Rankin (Stroke Patients Only)       Balance Overall balance assessment: Needs assistance Sitting-balance support: No upper extremity supported;Feet unsupported Sitting balance-Leahy Scale: Fair        Standing balance-Leahy Scale: Poor Standing balance comment: unable to assess as pt stated that she was too fatigued and having too much pain to attempt to stand or perform transfers.                             Pertinent Vitals/Pain Pain Assessment: 0-10 Pain Score: 9  Pain Location: R shoulder Pain Descriptors / Indicators: Aching Pain Intervention(s): Limited activity within patient's tolerance;Monitored during session;Repositioned    Home Living Family/patient expects to be discharged to:: Skilled nursing facility Living Arrangements: Alone                    Prior Function Level of Independence: Independent with assistive device(s);Needs assistance   Gait / Transfers Assistance Needed: pt uses manual w/c for mobility and utilizes stable surfaces for transfers; pt has a RW at home but stated that she does not use it for ambulation or transfers  ADL's / Homemaking Assistance Needed: daughter in law assists her with showers 2 days/wk. Pt able to dress herself, do laundry and heat up food  Comments: pt's son and daughter in law live very close by and check on her often, provide meal prep, grocery shopping and showering assist     Hand Dominance   Dominant Hand: Right    Extremity/Trunk Assessment   Upper Extremity Assessment: Defer to OT evaluation RUE Deficits /  Details: NWB RUE: Unable to fully assess due to immobilization       Lower Extremity Assessment: Generalized weakness;RLE deficits/detail RLE Deficits / Details: Pt with decreased strength as compared to L LE which she attributes to a "pinched nerve"; pt stated that since she has had back surgery in 2015, the strength in her R LE has declined. MMT revealed 2/5 for ankle dorsiflexion, 3/5 for knee extension, 1/5 for hip flexion, 3/5 for hip abduction, and 4/5 for hip adduction. Sensation grossly intact.       Communication   Communication: No difficulties  Cognition Arousal/Alertness:  Awake/alert Behavior During Therapy: WFL for tasks assessed/performed Overall Cognitive Status: No family/caregiver present to determine baseline cognitive functioning                    General Comments      Exercises      Assessment/Plan    PT Assessment Patient needs continued PT services  PT Diagnosis Generalized weakness   PT Problem List Decreased strength;Decreased range of motion;Decreased activity tolerance;Decreased balance;Decreased mobility;Decreased coordination;Pain  PT Treatment Interventions DME instruction;Gait training;Stair training;Functional mobility training;Therapeutic activities;Therapeutic exercise;Balance training;Neuromuscular re-education;Patient/family education   PT Goals (Current goals can be found in the Care Plan section) Acute Rehab PT Goals Patient Stated Goal: go rehab and then go home PT Goal Formulation: With patient Time For Goal Achievement: 01/19/16 Potential to Achieve Goals: Fair    Frequency Min 3X/week   Barriers to discharge        Co-evaluation               End of Session   Activity Tolerance: Patient limited by fatigue;Patient limited by pain Patient left: in chair;with call bell/phone within reach;with nursing/sitter in room Nurse Communication: Mobility status         Time: [id number] PT Time Calculation (min) (ACUTE ONLY): 11 min   Charges:   PT Evaluation $PT Eval Moderate Complexity: 1 Procedure     PT G CodesClearnce Sorrel Ravenne Wayment 01/12/2016, 2:22 PM Sherie Don, [place], DPT [phone]

## 2016-01-12 NOTE — Discharge Instructions (Signed)

## 2016-01-12 NOTE — NC FL2 (Signed)
[place] MEDICAID FL2 LEVEL OF CARE SCREENING TOOL     IDENTIFICATION  Patient Name: Katherine Owen Birthdate: 1928/02/15 Sex: female Admission Date (Current Location): 01/11/2016  [facility] and [place] Number:  Herbalist and Address:  [facility] [place], [place] [address] [place], [place] [address]      Provider Number: [id number]  Attending Physician Name and Address:  Justice Britain, MD  Relative Name and Phone Number:       Current Level of Care: Hospital Recommended Level of Care: [place] Prior Approval Number:    Date Approved/Denied:   PASRR Number: [id number]  Discharge Plan: SNF    Current Diagnoses: Patient Active Problem List   Diagnosis Date Noted  . 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 Poor mobility 08/28/2015  . Physical exam 10/27/2014  . Leg wound, left 06/29/2014  . Leg ulcer ([place]) 05/02/2014  . HLD (hyperlipidemia) 05/02/2014  . CN (constipation) 05/02/2014  . Spinal stenosis of lumbar region 05/02/2014  . Rhinitis, allergic 05/02/2014  . Neuropathic pain 05/02/2014  . Essential hypertension 05/02/2014    Orientation RESPIRATION BLADDER Height & Weight     Self, Time, Situation, Place  Normal Continent Weight: 142 lb (64.411 kg) Height:  5\' 4"  (162.6 cm)  BEHAVIORAL SYMPTOMS/MOOD NEUROLOGICAL BOWEL NUTRITION STATUS      Continent    AMBULATORY STATUS COMMUNICATION OF NEEDS Skin   Independent Verbally Surgical wounds                       Personal Care Assistance Level of Assistance  Dressing, Bathing Bathing Assistance: Limited assistance   Dressing Assistance: Limited assistance     Functional Limitations Info             SPECIAL CARE FACTORS FREQUENCY  PT (By licensed PT), OT (By licensed OT)     PT Frequency: daily OT Frequency: daily            Contractures Contractures Info: Not present    Additional Factors Info                  Current  Medications (01/12/2016):  This is the current hospital active medication list Current Facility-Administered Medications  Medication Dose Route Frequency Provider Last Rate Last Dose  . acetaminophen (TYLENOL) tablet 650 mg  650 mg Oral Q6H PRN Jenetta Loges, PA-C       Or  . acetaminophen (TYLENOL) suppository 650 mg  650 mg Rectal Q6H PRN Jenetta Loges, PA-C      . alum & mag hydroxide-simeth (MAALOX/MYLANTA) 200-200-20 MG/5ML suspension 30 mL  30 mL Oral Q4H PRN Tracy Shuford, PA-C      . atorvastatin (LIPITOR) tablet 10 mg  10 mg Oral QHS Tracy Shuford, PA-C   10 mg at 01/11/16 2220  . bisacodyl (DULCOLAX) EC tablet 5 mg  5 mg Oral Daily PRN Jenetta Loges, PA-C      . celecoxib (CELEBREX) capsule 200 mg  200 mg Oral Q12H Tracy Shuford, PA-C   200 mg at 01/11/16 2220  . diazepam (VALIUM) tablet 2 mg  2 mg Oral Q8H PRN Olivia Mackie Shuford, PA-C      . docusate sodium (COLACE) capsule 100 mg  100 mg Oral BID Jenetta Loges, PA-C   100 mg at 01/12/16 [id number]  . HYDROmorphone (DILAUDID) injection 0.5 mg  0.5 mg Intravenous Q2H PRN Jenetta Loges, PA-C   0.5 mg at 01/12/16 0341  . lactated ringers  infusion   Intravenous Continuous Jenetta Loges, PA-C 75 mL/hr at 01/12/16 0132    . loratadine (CLARITIN) tablet 10 mg  10 mg Oral Daily Tracy Shuford, PA-C   10 mg at 01/12/16 [id number]  . magnesium citrate solution 1 Bottle  1 Bottle Oral Once PRN Jenetta Loges, PA-C      . menthol-cetylpyridinium (CEPACOL) lozenge 3 mg  1 lozenge Oral PRN Olivia Mackie Shuford, PA-C       Or  . phenol (CHLORASEPTIC) mouth spray 1 spray  1 spray Mouth/Throat PRN Tracy Shuford, PA-C      . metoCLOPramide (REGLAN) tablet 5-10 mg  5-10 mg Oral Q8H PRN Tracy Shuford, PA-C       Or  . metoCLOPramide (REGLAN) injection 5-10 mg  5-10 mg Intravenous Q8H PRN Tracy Shuford, PA-C      . montelukast (SINGULAIR) tablet 10 mg  10 mg Oral QHS Tracy Shuford, PA-C   10 mg at 01/11/16 2220  . ondansetron (ZOFRAN) tablet 4 mg  4 mg Oral Q6H PRN Olivia Mackie Shuford,  PA-C       Or  . ondansetron (ZOFRAN) injection 4 mg  4 mg Intravenous Q6H PRN Tracy Shuford, PA-C      . oxyCODONE (Oxy IR/ROXICODONE) immediate release tablet 5-10 mg  5-10 mg Oral Q3H PRN Jenetta Loges, PA-C   10 mg at 01/12/16 0827  . polyethylene glycol (MIRALAX / GLYCOLAX) packet 17 g  17 g Oral Daily PRN Jenetta Loges, PA-C      . traMADol (ULTRAM) tablet 50 mg  50 mg Oral Q6H PRN Jenetta Loges, PA-C         Discharge Medications: Please see discharge summary for a list of discharge medications.  Relevant Imaging Results:  Relevant Lab Results:   Additional Information SSN: [id number]  Dulcy Fanny, LCSW

## 2016-01-12 NOTE — Care Management Important Message (Signed)
Important Message  Patient Details  Name: Katherine Owen MRN: [id number] Date of Birth: Nov 27, 1927   Medicare Important Message Given:  Yes    Loann Quill 01/12/2016, 8:25 AM

## 2016-01-12 NOTE — Clinical Social Work Placement (Signed)
   CLINICAL SOCIAL WORK PLACEMENT  NOTE  Date:  01/12/2016  Patient Details  Name: Katherine Owen MRN: [id number] Date of Birth: 09-Jan-1928  Clinical Social Work is seeking post-discharge placement for this patient at the [place] level of care (*CSW will initial, date and re-position this form in  chart as items are completed):  Yes   Patient/family provided with [place] Work Department's list of facilities offering this level of care within the geographic area requested by the patient (or if unable, by the patient's family).      Patient/family informed of their freedom to choose among providers that offer the needed level of care, that participate in Medicare, Medicaid or managed care program needed by the patient, have an available bed and are willing to accept the patient.  Yes   Patient/family informed of [facility]'s ownership interest in [facility] and [facility], as well as of the fact that they are under no obligation to receive care at these facilities.  PASRR submitted to EDS on       PASRR number received on       Existing PASRR number confirmed on 01/12/16     FL2 transmitted to all facilities in geographic area requested by pt/family on 01/12/16     FL2 transmitted to all facilities within larger geographic area on       Patient informed that his/her managed care company has contracts with or will negotiate with certain facilities, including the following:        Yes   Patient/family informed of bed offers received.  Patient [facility] bed at [facility]     Physician recommends and patient chooses bed at      Patient to be transferred to [facility] on 01/14/16.  Patient to be transferred to facility by [facility]     Patient family notified on   of transfer.  Name of family member notified:        PHYSICIAN Please sign FL2     Additional Comment:    _______________________________________________ Dulcy Fanny, LCSW 01/12/2016, 12:55 PM

## 2016-01-12 NOTE — Progress Notes (Signed)
Physical Therapy Treatment Patient Details Name: Katherine Owen MRN: [id number] DOB: 05-Jan-1928 Today's Date: 01/12/2016    History of Present Illness Pt is a 82 y/o female s/p R reverse TSA. PMH including but not limited to bilateral THA (2005, 2009), L ankle surgery (2014), and lumbar decompression (2015).      PT Comments    Pt performed back to bed transfer with assist from PTA.  Family found PTA in hall requesting back to bed.  PTA assisted and positioned patient for comfort.  Will continue to treat patient during acute hospitalization.    Follow Up Recommendations  SNF     Equipment Recommendations  None recommended by PT    Recommendations for Other Services       Precautions / Restrictions Precautions Precautions: Shoulder Required Braces or Orthoses: Sling (R UE) Restrictions Weight Bearing Restrictions: Yes RUE Weight Bearing: Non weight bearing    Mobility  Bed Mobility Overal bed mobility: Needs Assistance Bed Mobility: Sit to Supine       Sit to supine: Max assist   General bed mobility comments: Pt sitting edge of bed post transfer back to bed and required assist to lift B LEs back into bed.  Pt placed in [place]berg and used LLE to push and LUE to pull to boost up in bed while PTA used bed pad to boost on R side.    Transfers Overall transfer level: Needs assistance Equipment used: None Transfers: Squat Pivot Transfers     Squat pivot transfers: Max assist     General transfer comment: Pt required cues to push with LUE and PTA assisted patient to squat pivot from chair back to bed.  Pt reported she was fatigued from sitting up in chair and needed to lie down.  Pt presents with posterior lean sitting edge of bed and required cues for trunk control.    Ambulation/Gait                 Stairs            Wheelchair Mobility    Modified Rankin (Stroke Patients Only)       Balance Overall balance assessment: Needs  assistance Sitting-balance support: No upper extremity supported;Feet unsupported Sitting balance-Leahy Scale: Poor       Standing balance-Leahy Scale: Zero Standing balance comment: unable to assess as pt stated that she was too fatigued and having too much pain to attempt to stand or perform transfers.                    Cognition Arousal/Alertness: Awake/alert Behavior During Therapy: WFL for tasks assessed/performed Overall Cognitive Status: No family/caregiver present to determine baseline cognitive functioning                      Exercises      General Comments        Pertinent Vitals/Pain Pain Assessment: 0-10 Pain Score: 9  Pain Location: R shoulder Pain Descriptors / Indicators: Aching Pain Intervention(s): Limited activity within patient's tolerance;Monitored during session;Repositioned    Home Living Family/patient expects to be discharged to:: Skilled nursing facility Living Arrangements: Alone                  Prior Function Level of Independence: Independent with assistive device(s);Needs assistance  Gait / Transfers Assistance Needed: pt uses manual w/c for mobility and utilizes stable surfaces for transfers; pt has a RW at home but stated that she does not use it  for ambulation or transfers ADL's / Homemaking Assistance Needed: daughter in law assists her with showers 2 days/wk. Pt able to dress herself, do laundry and heat up food Comments: pt's son and daughter in law live very close by and check on her often, provide meal prep, grocery shopping and showering assist   PT Goals (current goals can now be found in the care plan section) Acute Rehab PT Goals Patient Stated Goal: go rehab and then go home PT Goal Formulation: With patient Time For Goal Achievement: 01/19/16 Potential to Achieve Goals: Fair Progress towards PT goals: Progressing toward goals    Frequency  Min 3X/week    PT Plan Current plan remains appropriate     Co-evaluation             End of Session Equipment Utilized During Treatment: Gait belt Activity Tolerance: Patient tolerated treatment well Patient left: with call bell/phone within reach;with nursing/sitter in room;in bed     Time: [id number] PT Time Calculation (min) (ACUTE ONLY): 13 min  Charges:  $Therapeutic Activity: 8-22 mins                    G Codes:      Cristela Blue Jan 24, 2016, 4:08 PM  Governor Rooks, PTA pager [phone]

## 2016-01-12 NOTE — Evaluation (Addendum)
Occupational Therapy Evaluation Patient Details Name: Katherine Owen MRN: [id number] DOB: 10-05-1927 Today's Date: 01/12/2016    History of Present Illness  R reverse TSA   Clinical Impression   Pt with decline in function and safety with ADLs and ADL mobility with decreases strength, balance, endurance and R UE ROM/function immobilized in sling. Pt with difficulty maintaining R UE NWB during bed mobility and SPT to [facility] and recliner. Pt requires +2 assist for bed mobility and and transfers. Pt and her family planning for pt to d/c to [facility] rehab where she has been before. Pt would benefit from acute OT services to increase level of care function    Follow Up Recommendations  SNF;Supervision/Assistance - 24 hour    Equipment Recommendations  Other (comment) (TBD)    Recommendations for Other Services  PT consult     Precautions / Restrictions Precautions Precautions: Shoulder Shoulder Interventions: At all times;Off for dressing/bathing/exercises;Shoulder sling/immobilizer Precaution Booklet Issued: Yes (comment) Precaution Comments: educated pt on shoulder protocol for sling, ADLs, positoning and ROM exercies as  instructed per MD. Damaris Schooner with pt's son on phone to inform him of handouts Required Braces or Orthoses: Sling Restrictions Weight Bearing Restrictions: Yes RUE Weight Bearing: Non weight bearing      Mobility Bed Mobility Overal bed mobility: +2 for physical assistance;Needs Assistance Bed Mobility: Rolling;Supine to Sit Rolling: Mod assist   Supine to sit: +2 for physical assistance;Mod assist        Transfers Overall transfer level: Needs assistance   Transfers: Sit to/from Stand;Stand Pivot Transfers Sit to Stand: Mod assist;+2 physical assistance Stand pivot transfers: Mod assist;+2 physical assistance       General transfer comment: mod verbal and physical cues required for correct hand placement of  L UE, technique and to maintain NWB of R  UE. Pt required 3 attempts from EOB for sit - stand with OT and nurse tech assist    Balance Overall balance assessment: Needs assistance   Sitting balance-Leahy Scale: Poor       Standing balance-Leahy Scale: Poor                              ADL Overall ADL's : Needs assistance/impaired     Grooming: Wash/dry hands;Wash/dry face;Set up;Sitting   Upper Body Bathing: Moderate assistance;Sitting   Lower Body Bathing: Total assistance;Bed level;Sitting/lateral leans   Upper Body Dressing : Moderate assistance;Sitting   Lower Body Dressing: Total assistance   Toilet Transfer: BSC;Moderate assistance;+2 for physical assistance;Stand-pivot   Toileting- Clothing Manipulation and Hygiene: Total assistance       Functional mobility during ADLs:  (mod verbal and physical cues required for correct hand placement of  L UE, technique and to maintain NWB of R UE. Pt required 3 attempts from EOB for sit - stand with OT and nurse tech assist)       Vision  wears glasses, no change from baseline              Pertinent Vitals/Pain Pain Assessment: 0-10 Pain Score: 7  Pain Location: R shoulder Pain Descriptors / Indicators: Aching Pain Intervention(s): Limited activity within patient's tolerance;Monitored during session;Premedicated before session;Repositioned     Hand Dominance Right   Extremity/Trunk Assessment Upper Extremity Assessment Upper Extremity Assessment: Generalized weakness;RUE deficits/detail RUE Deficits / Details: NWB RUE: Unable to fully assess due to immobilization   Lower Extremity Assessment Lower Extremity Assessment: Defer to PT evaluation  Communication Communication Communication: No difficulties   Cognition Arousal/Alertness: Awake/alert Behavior During Therapy: WFL for tasks assessed/performed Overall Cognitive Status: No family/caregiver present to determine baseline cognitive functioning       Memory: Decreased  recall of precautions (Pt required mod verbal and physical cues to maintain NWB of R UE during bed mobility and SPTs)             General Comments   Pt very pleasant and cooperative    Exercises  R elbow, wrist, hand ROM to tolerance 2 sets 5 reps R shoulder FF 0-90 and ABD 0-60 as instructed per MD orders     Shoulder Instructions Shoulder Instructions Donning/doffing shirt without moving shoulder: Moderate assistance Method for sponge bathing under operated UE: Moderate assistance Donning/doffing sling/immobilizer: Maximal assistance Correct positioning of sling/immobilizer: Moderate assistance ROM for elbow, wrist and digits of operated UE: Supervision/safety Sling wearing schedule (on at all times/off for ADL's): Supervision/safety Proper positioning of operated UE when showering: Supervision/safety Positioning of UE while sleeping: [place] expects to be discharged to:: Skilled nursing facility ([facility] for rehab) Living Arrangements: Alone                                      Prior Functioning/Environment Level of Independence: Independent with assistive device(s);Needs assistance  Gait / Transfers Assistance Needed: uses w/c in house for most mobility. Uses RW for transfers ADL's / Homemaking Assistance Needed: daughter in law assists her with showers 2 days/wk. Pt able to dress herself, do laundry and heat up food   Comments: pt's son and daughter in law live very close by and check on her often, provide meal prep, grocery shopping and showering assist    OT Diagnosis: Generalized weakness;Acute pain   OT Problem List: Decreased strength;Decreased knowledge of use of DME or AE;Decreased knowledge of precautions;Decreased activity tolerance;Impaired balance (sitting and/or standing);Pain   OT Treatment/Interventions: Self-care/ADL training;Patient/family education;Therapeutic activities;DME and/or AE  instruction;Therapeutic exercise    OT Goals(Current goals can be found in the care plan section) Acute Rehab OT Goals Patient Stated Goal: go rehab and then go home OT Goal Formulation: With patient/family Time For Goal Achievement: 01/19/16 Potential to Achieve Goals: Good ADL Goals Pt Will Perform Grooming: with supervision;with set-up;sitting Pt Will Perform Upper Body Bathing: with min assist;sitting Pt Will Perform Lower Body Bathing: with mod assist;sitting/lateral leans;with max assist Pt Will Perform Upper Body Dressing: with min assist;sitting Pt Will Transfer to Toilet: with mod assist;with min assist;bedside commode Additional ADL Goal #1: pt will sit EOB and tolerate R wrist/hand/foream ROM exercises as insructed by MD Additional ADL Goal #2: Pt will sit EOB to tolerate FF 0-90 and ABD 0-60 exercises of R shoulder as instructed per MD orders  OT Frequency: Min 2X/week   Barriers to D/C: Decreased caregiver support  pt planning to d/c to [facility] SNF for rehab                     End of Session Equipment Utilized During Treatment: Gait belt;Other (comment) (BSC, sling) Nurse Communication: Mobility status  Activity Tolerance: Patient tolerated treatment well Patient left: in chair;with call bell/phone within reach   Time: 0906-1004 OT Time Calculation (min): 58 min Charges:  OT General Charges $OT Visit: 1 Procedure OT Evaluation $OT Eval Moderate Complexity: 1 Procedure OT Treatments $Self Care/Home Management : 8-22 mins $Therapeutic  Activity: 8-22 mins $Therapeutic Exercise: 8-22 mins G-Codes:    Britt Bottom 01/12/2016, 11:31 AM

## 2016-01-13 NOTE — Progress Notes (Signed)
Subjective: 2 Days Post-Op Procedure(s) (LRB): REVERSE SHOULDER ARTHROPLASTY (Right) Patient reports pain as 3 on 0-10 scale.  Feels comfortable in good spirits. Objective: Vital signs in last 24 hours: Temp:  [97.2 F (36.2 C)-99.7 F (37.6 C)] 98 F (36.7 C) (07/22 0547) Pulse Rate:  [82-92] 87 (07/22 0547) Resp:  [16] 16 (07/22 0547) BP: (108-152)/(50-58) 152/58 mmHg (07/22 0547) SpO2:  [97 %-100 %] 100 % (07/22 0547)  Intake/Output from previous day: 07/21 0701 - 07/22 0700 In: 960 [P.O.:960] Out: -  Intake/Output this shift:    No results for input(s): HGB in the last 72 hours. No results for input(s): WBC, RBC, HCT, PLT in the last 72 hours. No results for input(s): NA, K, CL, CO2, BUN, CREATININE, GLUCOSE, CALCIUM in the last 72 hours. No results for input(s): LABPT, INR in the last 72 hours.  Sensation intact distally Intact pulses distally Incision: dressing C/D/I  Assessment/Plan: 2 Days Post-Op Procedure(s) (LRB): REVERSE SHOULDER ARTHROPLASTY (Right) Advance diet Up with therapy Discharge to SNF tomorrow.  Blimie Vaness ANDREW 01/13/2016, 10:16 AM

## 2016-01-14 DIAGNOSIS — Z96611 Presence of right artificial shoulder joint: Secondary | ICD-10-CM | POA: Diagnosis not present

## 2016-01-14 DIAGNOSIS — R21 Rash and other nonspecific skin eruption: Secondary | ICD-10-CM | POA: Diagnosis not present

## 2016-01-14 DIAGNOSIS — L299 Pruritus, unspecified: Secondary | ICD-10-CM | POA: Diagnosis not present

## 2016-01-14 DIAGNOSIS — M6281 Muscle weakness (generalized): Secondary | ICD-10-CM | POA: Diagnosis not present

## 2016-01-14 DIAGNOSIS — R2681 Unsteadiness on feet: Secondary | ICD-10-CM | POA: Diagnosis not present

## 2016-01-14 DIAGNOSIS — J309 Allergic rhinitis, unspecified: Secondary | ICD-10-CM | POA: Diagnosis not present

## 2016-01-14 DIAGNOSIS — E785 Hyperlipidemia, unspecified: Secondary | ICD-10-CM | POA: Diagnosis not present

## 2016-01-14 DIAGNOSIS — F411 Generalized anxiety disorder: Secondary | ICD-10-CM | POA: Diagnosis not present

## 2016-01-14 DIAGNOSIS — K5900 Constipation, unspecified: Secondary | ICD-10-CM | POA: Diagnosis not present

## 2016-01-14 DIAGNOSIS — I10 Essential (primary) hypertension: Secondary | ICD-10-CM | POA: Diagnosis not present

## 2016-01-14 DIAGNOSIS — Z471 Aftercare following joint replacement surgery: Secondary | ICD-10-CM | POA: Diagnosis not present

## 2016-01-14 DIAGNOSIS — M25519 Pain in unspecified shoulder: Secondary | ICD-10-CM | POA: Diagnosis not present

## 2016-01-14 DIAGNOSIS — S46001D Unspecified injury of muscle(s) and tendon(s) of the rotator cuff of right shoulder, subsequent encounter: Secondary | ICD-10-CM | POA: Diagnosis not present

## 2016-01-14 DIAGNOSIS — M12811 Other specific arthropathies, not elsewhere classified, right shoulder: Secondary | ICD-10-CM | POA: Diagnosis not present

## 2016-01-14 DIAGNOSIS — M19012 Primary osteoarthritis, left shoulder: Secondary | ICD-10-CM | POA: Diagnosis not present

## 2016-01-14 NOTE — Progress Notes (Signed)
Subjective: 3 Days Post-Op Procedure(s) (LRB): REVERSE SHOULDER ARTHROPLASTY (Right) Patient reports pain as mild.  No numbness or tingling. Pain well controlled. No other c/o.  Objective: Vital signs in last 24 hours: Temp:  [98 F (36.7 C)-98.8 F (37.1 C)] 98 F (36.7 C) (07/23 0400) Pulse Rate:  [85-99] 85 (07/23 0400) Resp:  [17] 17 (07/22 1300) BP: (94-113)/(55-62) 113/62 (07/23 0400) SpO2:  [97 %-100 %] 97 % (07/23 0400)  Intake/Output from previous day: 07/22 0701 - 07/23 0700 In: 580 [P.O.:580] Out: -  Intake/Output this shift: No intake/output data recorded.  No results for input(s): HGB in the last 72 hours. No results for input(s): WBC, RBC, HCT, PLT in the last 72 hours. No results for input(s): NA, K, CL, CO2, BUN, CREATININE, GLUCOSE, CALCIUM in the last 72 hours. No results for input(s): LABPT, INR in the last 72 hours.  Neurologically intact ABD soft Neurovascular intact Sensation intact distally Intact pulses distally Dorsiflexion/Plantar flexion intact Incision: dressing C/D/I and no drainage No cellulitis present Compartment soft no sign of DVT  Assessment/Plan: 3 Days Post-Op Procedure(s) (LRB): REVERSE SHOULDER ARTHROPLASTY (Right) Advance diet Up with therapy D/C IV fluids  Plan D/C to [facility] today Follow up with Dr. Onnie Graham as an outpt as directed Discussed D/C instructions  Montoya Watkin, Ellison Hughs M. 01/14/2016, 8:03 AM

## 2016-01-14 NOTE — Clinical Social Work Note (Signed)
CSW notified that pt medically stable and cleared by MD to discharge from hospital today. CSW contacted pt's children to inform that pt discharging. CSW also contacted [facility] to inform that pt is discharging and will arrive via [facility]. CSW sent discharge summary and orders to [facility] and contacted [facility]. CSW also requested that nurse send report to SNF.

## 2016-01-15 ENCOUNTER — Other Ambulatory Visit: Payer: Self-pay

## 2016-01-15 DIAGNOSIS — M150 Primary generalized (osteo)arthritis: Principal | ICD-10-CM

## 2016-01-15 DIAGNOSIS — M159 Polyosteoarthritis, unspecified: Secondary | ICD-10-CM

## 2016-01-15 MED ORDER — TRAMADOL HCL 50 MG PO TABS: 50.0000 mg | ORAL_TABLET | Freq: Three times a day (TID) | ORAL | 5 refills | Status: DC

## 2016-01-15 MED ORDER — DIAZEPAM 2 MG PO TABS: 2.0000 mg | ORAL_TABLET | Freq: Four times a day (QID) | ORAL | 0 refills | Status: DC | PRN

## 2016-01-15 MED ORDER — OXYCODONE-ACETAMINOPHEN 5-325 MG PO TABS: 1.0000 | ORAL_TABLET | ORAL | 0 refills | Status: DC | PRN

## 2016-01-15 NOTE — Telephone Encounter (Signed)
Rx faxed to [facility] @ [phone], phone number [phone]  

## 2016-01-18 ENCOUNTER — Non-Acute Institutional Stay (SKILLED_NURSING_FACILITY): Payer: Medicare Other | Admitting: Internal Medicine

## 2016-01-18 ENCOUNTER — Encounter: Payer: Self-pay | Admitting: Internal Medicine

## 2016-01-18 DIAGNOSIS — F411 Generalized anxiety disorder: Secondary | ICD-10-CM

## 2016-01-18 DIAGNOSIS — M12811 Other specific arthropathies, not elsewhere classified, right shoulder: Secondary | ICD-10-CM

## 2016-01-18 DIAGNOSIS — I10 Essential (primary) hypertension: Secondary | ICD-10-CM | POA: Diagnosis not present

## 2016-01-18 DIAGNOSIS — J309 Allergic rhinitis, unspecified: Secondary | ICD-10-CM | POA: Diagnosis not present

## 2016-01-18 DIAGNOSIS — E785 Hyperlipidemia, unspecified: Secondary | ICD-10-CM | POA: Diagnosis not present

## 2016-01-18 DIAGNOSIS — K5900 Constipation, unspecified: Secondary | ICD-10-CM

## 2016-01-18 DIAGNOSIS — M75101 Unspecified rotator cuff tear or rupture of right shoulder, not specified as traumatic: Principal | ICD-10-CM

## 2016-01-18 DIAGNOSIS — K5909 Other constipation: Secondary | ICD-10-CM

## 2016-01-18 NOTE — Progress Notes (Signed)
LOCATION: [place]  PCP: Ann Held, DO   Code Status: Full Code  Goals of care: Advanced Directive information Advanced Directives 01/12/2016  Does patient have an advance directive? No  Would patient like information on creating an advanced directive? No - patient declined information       Extended Emergency Contact Information Primary Emergency Contact: Miles,Allen Address: [address]          [place], [place] [address] Johnnette Litter of [place] Phone: [phone] Relation: Son Secondary Emergency Contact: Dutt,Jody Address: [place] [address] Johnnette Litter of [place] Phone: [phone] Mobile Phone: [phone] Relation: Daughter   Allergies  Allergen Reactions  . No Known Allergies     Chief Complaint  Patient presents with  . Readmit To SNF    Readmission     HPI:  Patient is a 82 y.o. female seen today for short term rehabilitation post hospital admission from 01/11/16-01/14/16 with right shoulder rotator cuff arthropathy. She underwent reverse shoulder arthroplasty on 01/11/16. She is seen in her room today. Her pain medication has been helpful. She has been working with therapy team.   Review of Systems:  Constitutional: Negative for fever, diaphoresis.  HENT: Negative for headache, congestion, nasal discharge Eyes: Negative for blurred vision, double vision and discharge.  Respiratory: Negative for cough, shortness of breath and wheezing.   Cardiovascular: Negative for chest pain, palpitations, leg swelling.  Gastrointestinal: Negative for heartburn, nausea, vomiting, abdominal pain. Had a bowel movement 2 days back Genitourinary: Negative for dysuria and flank pain.  Musculoskeletal: Negative for back pain, fall.  Skin: Negative for itching, rash.  Neurological: Negative for dizziness. Psychiatric/Behavioral: Negative for depression   Past Medical History:  Diagnosis Date  . Allergy   .  Arthritis   . Cataract    Bilateral  . Headache   . History of shingles   . Hyperlipidemia   . Hypertension    Past Surgical History:  Procedure Laterality Date  . ANKLE SURGERY Left 2014  . BACK SURGERY  2015   [facility]   . BUNIONECTOMY Right   . COLONOSCOPY    . EYE SURGERY     cataract  . REVERSE SHOULDER ARTHROPLASTY Right 01/11/2016   Procedure: REVERSE SHOULDER ARTHROPLASTY;  Surgeon: Justice Britain, MD;  Location: [place];  Service: Orthopedics;  Laterality: Right;  . SHOULDER SURGERY  01/11/2016   REVERSE SHOULDER ARTHROPLASTY on 01/11/2016  . SPINE SURGERY     done at [facility] Apr 26, 2014, lumbar decompression  . TOTAL HIP ARTHROPLASTY Bilateral 2005, 2009   [place] , [place]   Social History:   reports that she has quit smoking. Her smoking use included Cigarettes. She has a 20.00 pack-year smoking history. She has never used smokeless tobacco. She reports that she does not drink alcohol or use drugs.  Family History  Problem Relation Age of Onset  . Cancer Mother     colon  . Arthritis Mother     osteoarthritis  . Arthritis Father     osteoarthritis  . Parkinson's disease Father   . Anesthesia problems Neg Hx     Medications:   Medication List       Accurate as of 01/18/16  3:06 PM. Always use your most recent med list.          atorvastatin 10 MG tablet Commonly known as:  LIPITOR Take 1 tablet (10 mg total) by  mouth at bedtime. For high cholesterol   celecoxib 200 MG capsule Commonly known as:  CELEBREX Take 1 capsule (200 mg total) by mouth daily.   CENTRUM SILVER tablet Take 1 tablet by mouth daily.   cetirizine 10 MG tablet Commonly known as:  ZYRTEC ALLERGY Take 1 tablet (10 mg total) by mouth at bedtime.   diazepam 2 MG tablet Commonly known as:  VALIUM Take 1 tablet (2 mg total) by mouth every 6 (six) hours as needed for anxiety.   EXCEDRIN EXTRA STRENGTH PO Take 1 tablet by mouth 2 (two) times daily as needed (pain).   montelukast 10 MG  tablet Commonly known as:  SINGULAIR Take 1 tablet (10 mg total) by mouth at bedtime.   ondansetron 4 MG tablet Commonly known as:  ZOFRAN Take 1 tablet (4 mg total) by mouth every 8 (eight) hours as needed for nausea or vomiting.   oxyCODONE-acetaminophen 5-325 MG tablet Commonly known as:  PERCOCET Take 1-2 tablets by mouth every 4 (four) hours as needed.   senna-docusate 8.6-50 MG tablet Commonly known as:  Senokot-S Take 1 tablet by mouth 2 (two) times daily. For constipation   traMADol 50 MG tablet Commonly known as:  ULTRAM Take 1 tablet (50 mg total) by mouth 3 (three) times daily.       Immunizations: Immunization History  Administered Date(s) Administered  . Influenza,inj,Quad PF,36+ Mos 02/23/2015  . Influenza-Unspecified 05/05/2014  . PPD Test 01/14/2016  . Pneumococcal Conjugate-13 06/29/2014  . Pneumococcal Polysaccharide-23 06/29/2012  . Td 06/24/2012     Physical Exam: Vitals:   01/18/16 1503  BP: 130/80  Pulse: 96  Resp: 18  Temp: 98.2 F (36.8 C)  TempSrc: Oral  Weight: 142 lb (64.4 kg)  Height: 5\' 4"  (1.626 m)   Body mass index is 24.37 kg/m.  General- elderly female, well built, in no acute distress Head- normocephalic, atraumatic Nose- no nasal discharge Throat- moist mucus membrane Eyes- PERRLA, EOMI, no pallor, no icterus, no discharge, normal conjunctiva, normal sclera Neck- no cervical lymphadenopathy Cardiovascular- normal s1,s2, no murmur Respiratory- bilateral clear to auscultation, no wheeze, no rhonchi, no crackles, no use of accessory muscles Abdomen- bowel sounds present, soft, non tender Musculoskeletal- able to move all 4 extremities, limited ROM to right shoulder, sling present to right arm Neurological- alert and oriented to person, place and time Skin- warm and dry, right shoulder surgical incision with aquacel dressing in place Psychiatry- normal mood and affect    Labs reviewed: Basic Metabolic Panel:  Recent  Labs  05/01/15 1111 10/24/15 1115 01/08/16 1029  NA 139 139 141  K 4.5 4.5 4.8  CL 104 104 105  CO2 28 31 29   GLUCOSE 101* 92 99  BUN 20 19 19   CREATININE 0.68 0.57 0.49  CALCIUM 10.2 10.3 10.1   Liver Function Tests:  Recent Labs  05/01/15 1111 10/24/15 1115  AST 34 29  ALT 23 20  ALKPHOS 77 73  BILITOT 0.3 0.3  PROT 6.7 6.7  ALBUMIN 3.8 3.8   No results for input(s): LIPASE, AMYLASE in the last 8760 hours. No results for input(s): AMMONIA in the last 8760 hours. CBC:  Recent Labs  05/01/15 1111 12/12/15 1717 01/08/16 1029  WBC 8.6 8.3 8.0  NEUTROABS 7.0 5.6  --   HGB 12.4 12.3 12.0  HCT 38.4 37.9 38.8  MCV 81.7 82.3 84.3  PLT 274.0 267.0 239     Assessment/Plan  Right shoulder arthropathy S/p reverse shoulder arthroplasty. Will have  her work with physical therapy and occupational therapy team to help with gait training and muscle strengthening exercises.fall precautions. Skin care. Encourage to be out of bed. Continue percocet 5-325 mg 1-2 tab q4h prn pain with tramadol 50 mg tid. Has orthopedic follow up. Check cbc and bmp  Constipation Add miralax 17 g daily and prune juice. Change her senokot s to 2 tab bid as per her home regimen. Hydration encouraged  GAD Continue valium 2 mg q6h prn for anxiety and monitor  HLD Continue lipitor 10 mg daily  Allergic rhinitis Continue cetirizine daily and monitor. Continue singulair.   HTN Stable, off antihypertensive, monitor BP   Goals of care: short term rehabilitation   Labs/tests ordered: cbc, cmp 01/22/16  Family/ staff Communication: reviewed care plan with patient and nursing supervisor    Blanchie Serve, MD Internal Medicine [place], [place] [address] Cell Phone (Monday-Friday 8 am - 5 pm): [phone] On Call: [phone] and follow prompts after 5 pm and on weekends Office Phone: [phone] Office Fax: [phone]

## 2016-01-22 LAB — BASIC METABOLIC PANEL
BUN: 17 mg/dL (ref 4–21)
CREATININE: 0.5 mg/dL (ref 0.5–1.1)
GLUCOSE: 83 mg/dL
POTASSIUM: 5.5 mmol/L — AB (ref 3.4–5.3)
Sodium: 143 mmol/L (ref 137–147)

## 2016-01-22 LAB — CBC AND DIFFERENTIAL
HCT: 33 % — AB (ref 36–46)
HEMOGLOBIN: 10.1 g/dL — AB (ref 12.0–16.0)
Neutrophils Absolute: 4 /uL
PLATELETS: 430 10*3/uL — AB (ref 150–399)
WBC: 6.7 10*3/mL

## 2016-01-22 LAB — HEPATIC FUNCTION PANEL
ALT: 23 U/L (ref 7–35)
AST: 20 U/L (ref 13–35)
Alkaline Phosphatase: 117 U/L (ref 25–125)
Bilirubin, Total: 0.3 mg/dL

## 2016-01-23 DIAGNOSIS — I10 Essential (primary) hypertension: Secondary | ICD-10-CM | POA: Diagnosis not present

## 2016-01-23 DIAGNOSIS — R21 Rash and other nonspecific skin eruption: Secondary | ICD-10-CM | POA: Diagnosis not present

## 2016-01-23 DIAGNOSIS — R2681 Unsteadiness on feet: Secondary | ICD-10-CM | POA: Diagnosis not present

## 2016-01-23 DIAGNOSIS — L299 Pruritus, unspecified: Secondary | ICD-10-CM | POA: Diagnosis not present

## 2016-01-23 DIAGNOSIS — Z96611 Presence of right artificial shoulder joint: Secondary | ICD-10-CM | POA: Diagnosis not present

## 2016-01-23 DIAGNOSIS — M19012 Primary osteoarthritis, left shoulder: Secondary | ICD-10-CM | POA: Diagnosis not present

## 2016-01-23 DIAGNOSIS — K5900 Constipation, unspecified: Secondary | ICD-10-CM | POA: Diagnosis not present

## 2016-01-23 DIAGNOSIS — E785 Hyperlipidemia, unspecified: Secondary | ICD-10-CM | POA: Diagnosis not present

## 2016-01-23 DIAGNOSIS — Z471 Aftercare following joint replacement surgery: Secondary | ICD-10-CM | POA: Diagnosis not present

## 2016-01-23 DIAGNOSIS — S46001D Unspecified injury of muscle(s) and tendon(s) of the rotator cuff of right shoulder, subsequent encounter: Secondary | ICD-10-CM | POA: Diagnosis not present

## 2016-01-23 DIAGNOSIS — M6281 Muscle weakness (generalized): Secondary | ICD-10-CM | POA: Diagnosis not present

## 2016-01-23 DIAGNOSIS — M12811 Other specific arthropathies, not elsewhere classified, right shoulder: Secondary | ICD-10-CM | POA: Diagnosis not present

## 2016-01-23 DIAGNOSIS — F411 Generalized anxiety disorder: Secondary | ICD-10-CM | POA: Diagnosis not present

## 2016-01-24 DIAGNOSIS — Z96611 Presence of right artificial shoulder joint: Secondary | ICD-10-CM | POA: Diagnosis not present

## 2016-01-24 DIAGNOSIS — Z471 Aftercare following joint replacement surgery: Secondary | ICD-10-CM | POA: Diagnosis not present

## 2016-01-24 LAB — BASIC METABOLIC PANEL
BUN: 13 mg/dL (ref 4–21)
CREATININE: 0.5 mg/dL (ref 0.5–1.1)
GLUCOSE: 91 mg/dL
Potassium: 4.5 mmol/L (ref 3.4–5.3)
SODIUM: 142 mmol/L (ref 137–147)

## 2016-01-29 ENCOUNTER — Telehealth: Payer: Self-pay | Admitting: Family Medicine

## 2016-01-29 ENCOUNTER — Non-Acute Institutional Stay (SKILLED_NURSING_FACILITY): Payer: Medicare Other | Admitting: Adult Health

## 2016-01-29 ENCOUNTER — Encounter: Payer: Self-pay | Admitting: Adult Health

## 2016-01-29 DIAGNOSIS — L299 Pruritus, unspecified: Secondary | ICD-10-CM

## 2016-01-29 DIAGNOSIS — R21 Rash and other nonspecific skin eruption: Secondary | ICD-10-CM | POA: Diagnosis not present

## 2016-01-29 DIAGNOSIS — M150 Primary generalized (osteo)arthritis: Principal | ICD-10-CM

## 2016-01-29 DIAGNOSIS — M159 Polyosteoarthritis, unspecified: Secondary | ICD-10-CM

## 2016-01-29 NOTE — Telephone Encounter (Signed)
Pt's family member dropped off document for PCP to fill out [employer] for medicines) Please call pt when document ready. Tel [phone]. Document put in front desk tray.

## 2016-01-29 NOTE — Progress Notes (Signed)
Patient ID: Katherine Owen, female   DOB: 1927/12/18, 82 y.o.   MRN: [id number]    DATE:  01/29/2016   MRN:  [id number]  BIRTHDAY: 10-03-1927  Facility:  [facility] Home Location:  [place] [facility] [place] Room Number: [id number]  LEVEL OF CARE:  SNF [phone])  Contact Information    Name Relation Home Work Jay Son [phone]   Clementson,Jody Daughter [phone]  [phone]       Code Status History    Date Active Date Inactive Code Status Order ID Comments User Context   01/11/2016  3:23 PM 01/14/2016  5:13 PM Full Code [id number]  Jenetta Loges, PA-C Inpatient       Chief Complaint  Patient presents with  . Acute Visit    Rash bilateral thighs and low back    HISTORY OF PRESENT ILLNESS:  This is an 82 year old female who was noted to have welts on bilateral thighs and lower back. She complains of itching.   She has been admitted to [facility] on 01/14/16 from [facility] with right shoulder rotator cuff arthropathy for which she had reverse shoulder arthroplasty on 01/11/16.   PAST MEDICAL HISTORY:  Past Medical History:  Diagnosis Date  . Allergy   . Arthritis   . Cataract    Bilateral  . Headache   . History of shingles   . Hyperlipidemia   . Hypertension      CURRENT MEDICATIONS: Reviewed  Patient's Medications  New Prescriptions   No medications on file  Previous Medications   ASPIRIN-ACETAMINOPHEN-CAFFEINE (EXCEDRIN EXTRA STRENGTH PO)    Take 1 tablet by mouth 2 (two) times daily as needed (pain).    ATORVASTATIN (LIPITOR) 10 MG TABLET    Take 1 tablet (10 mg total) by mouth at bedtime. For high cholesterol   CELECOXIB (CELEBREX) 200 MG CAPSULE    Take 1 capsule (200 mg total) by mouth daily.   CETIRIZINE (ZYRTEC ALLERGY) 10 MG TABLET    Take 1 tablet (10 mg total) by mouth at bedtime.   DIAZEPAM (VALIUM) 2 MG TABLET    Take 1 tablet (2 mg total) by mouth every 6 (six) hours as needed for  anxiety.   DIPHENHYDRAMINE (BENADRYL) 25 MG TABLET    Take 25 mg by mouth every 8 (eight) hours as needed for itching.   HYDROCORTISONE CREAM 1 %    Apply 1 application topically 2 (two) times daily. Apply to rash on bilateral thighs and lower back   MONTELUKAST (SINGULAIR) 10 MG TABLET    Take 1 tablet (10 mg total) by mouth at bedtime.   MULTIPLE VITAMINS-MINERALS (CENTRUM SILVER) TABLET    Take 1 tablet by mouth daily.   ONDANSETRON (ZOFRAN) 4 MG TABLET    Take 1 tablet (4 mg total) by mouth every 8 (eight) hours as needed for nausea or vomiting.   OXYCODONE-ACETAMINOPHEN (PERCOCET) 5-325 MG TABLET    Take 1-2 tablets by mouth every 4 (four) hours as needed.   POLYETHYLENE GLYCOL (MIRALAX / GLYCOLAX) PACKET    Take 17 g by mouth daily.   SENNA-DOCUSATE (SENOKOT-S) 8.6-50 MG PER TABLET    Take 2 tablets by mouth 2 (two) times daily. For constipation    TRAMADOL (ULTRAM) 50 MG TABLET    Take 1 tablet (50 mg total) by mouth 3 (three) times daily.  Modified Medications   No medications on file  Discontinued Medications   No medications on file  Allergies  Allergen Reactions  . No Known Allergies      REVIEW OF SYSTEMS:  GENERAL: no change in appetite, no fatigue, no weight changes, no fever, chills or weakness EYES: Denies change in vision, dry eyes, eye pain, itching or discharge EARS: Denies change in hearing, ringing in ears, or earache NOSE: Denies nasal congestion or epistaxis MOUTH and THROAT: Denies oral discomfort, gingival pain or bleeding, pain from teeth or hoarseness   RESPIRATORY: no cough, SOB, DOE, wheezing, hemoptysis CARDIAC: no chest pain, edema or palpitations GI: no abdominal pain, diarrhea, constipation, heart burn, nausea or vomiting GU: Denies dysuria, frequency, hematuria, incontinence, or discharge PSYCHIATRIC: Denies feeling of depression or anxiety. No report of hallucinations, insomnia, paranoia, or agitation   PHYSICAL EXAMINATION  GENERAL  APPEARANCE: Well nourished. In no acute distress. Normal body habitus SKIN:  Rashes on bilateral upper thighs and lower back HEAD: Normal in size and contour. No evidence of trauma EYES: Lids open and close normally. No blepharitis, entropion or ectropion. PERRL. Conjunctivae are clear and sclerae are white. Lenses are without opacity EARS: Pinnae are normal. Patient hears normal voice tunes of the examiner MOUTH and THROAT: Lips are without lesions. Oral mucosa is moist and without lesions. Tongue is normal in shape, size, and color and without lesions NECK: supple, trachea midline, no neck masses, no thyroid tenderness, no thyromegaly LYMPHATICS: no LAN in the neck, no supraclavicular LAN RESPIRATORY: breathing is even & unlabored, BS CTAB CARDIAC: RRR, no murmur,no extra heart sounds, no edema GI: abdomen soft, normal BS, no masses, no tenderness, no hepatomegaly, no splenomegaly EXTREMITIES:  Able to move X 4 extremities; limited ROM to right shoulder due to surgery; has right shoulder sling PSYCHIATRIC: Alert and oriented X 3. Affect and behavior are appropriate  LABS/RADIOLOGY: Labs reviewed: Basic Metabolic Panel:  Recent Labs  05/01/15 1111 10/24/15 1115 01/08/16 1029 01/22/16 1313 01/24/16 1232  NA 139 139 141 143 142  K 4.5 4.5 4.8 5.5* 4.5  CL 104 104 105  --   --   CO2 28 31 29   --   --   GLUCOSE 101* 92 99  --   --   BUN 20 19 19 17 13   CREATININE 0.68 0.57 0.49 0.5 0.5  CALCIUM 10.2 10.3 10.1  --   --    Liver Function Tests:  Recent Labs  05/01/15 1111 10/24/15 1115 01/22/16 1313  AST 34 29 20  ALT 23 20 23   ALKPHOS 77 73 117  BILITOT 0.3 0.3  --   PROT 6.7 6.7  --   ALBUMIN 3.8 3.8  --    CBC:  Recent Labs  05/01/15 1111 12/12/15 1717 01/08/16 1029 01/22/16 1313  WBC 8.6 8.3 8.0 6.7  NEUTROABS 7.0 5.6  --  4  HGB 12.4 12.3 12.0 10.1*  HCT 38.4 37.9 38.8 33*  MCV 81.7 82.3 84.3  --   PLT 274.0 267.0 239 430*   A1C: Invalid input(s):  A1C Lipid Panel:  Recent Labs  05/01/15 1111 10/24/15 1115  HDL 51.00 52.20     ASSESSMENT/PLAN:  Rash - this is thought to be due to contact allergy; start Hydrocortisone 1 % cream to rashes on bilateral inner thighs and lower back BID X 2 weeks  Pruritus - give Benadryl 25 mg 1 tab PO Q 8 hours PRN X 3 days, hold for sedation   Durenda Age, NP [place]

## 2016-02-05 NOTE — Telephone Encounter (Signed)
Document received. Form filled in as much as possible and forwarded to provider for review and signature.

## 2016-02-06 MED ORDER — CELECOXIB 200 MG PO CAPS: 200.0000 mg | ORAL_CAPSULE | Freq: Every day | ORAL | 0 refills | Status: DC

## 2016-02-06 NOTE — Telephone Encounter (Signed)
Form completed by provider.  Rx printed and attached to application.

## 2016-02-08 NOTE — Telephone Encounter (Signed)
Notified patient's son, who dropped off form, and made him aware that form was placed up front and ready for pick up.  He stated understanding and was appreciative for the call.  No further questions or concerns voiced.

## 2016-02-08 NOTE — Telephone Encounter (Signed)
Copy made and sent for scanning.

## 2016-02-09 ENCOUNTER — Encounter: Payer: Self-pay | Admitting: Adult Health

## 2016-02-09 ENCOUNTER — Non-Acute Institutional Stay (SKILLED_NURSING_FACILITY): Payer: Medicare Other | Admitting: Adult Health

## 2016-02-09 DIAGNOSIS — K5909 Other constipation: Secondary | ICD-10-CM

## 2016-02-09 DIAGNOSIS — F411 Generalized anxiety disorder: Secondary | ICD-10-CM | POA: Diagnosis not present

## 2016-02-09 DIAGNOSIS — M12811 Other specific arthropathies, not elsewhere classified, right shoulder: Secondary | ICD-10-CM | POA: Diagnosis not present

## 2016-02-09 DIAGNOSIS — R21 Rash and other nonspecific skin eruption: Secondary | ICD-10-CM | POA: Diagnosis not present

## 2016-02-09 DIAGNOSIS — K5900 Constipation, unspecified: Secondary | ICD-10-CM

## 2016-02-09 DIAGNOSIS — E785 Hyperlipidemia, unspecified: Secondary | ICD-10-CM

## 2016-02-09 DIAGNOSIS — I10 Essential (primary) hypertension: Secondary | ICD-10-CM | POA: Diagnosis not present

## 2016-02-09 DIAGNOSIS — M75101 Unspecified rotator cuff tear or rupture of right shoulder, not specified as traumatic: Principal | ICD-10-CM

## 2016-02-09 NOTE — Progress Notes (Signed)
Patient ID: Katherine Owen, female   DOB: 12/24/1927, 82 y.o.   MRN: [id number]    DATE:  02/09/2016   MRN:  [id number]  BIRTHDAY: [birth year]  Facility:  Nursing Home Location:  [place] Room Number: [id number]  LEVEL OF CARE:  SNF [phone])  Contact Information    Name Relation Home Work [place] Son [phone]   Krontz,Jody Daughter [phone]  [phone]       Code Status History    Date Active Date Inactive Code Status Order ID Comments User Context   01/11/2016  3:23 PM 01/14/2016  5:13 PM Full Code [id number]  Jenetta Loges, PA-C Inpatient       Chief Complaint  Patient presents with  . Medical Management of Chronic Issues    HISTORY OF PRESENT ILLNESS:  This is an 82 year old female who is being seen for a routine visit. She was recently treated for rashes thought to be from contact allergy. She continues to have PT and OT. She was recently started on Senokot-S and Miralax for constipation.   She has been admitted to [facility] on 01/14/16 from [facility] with right shoulder rotator cuff arthropathy for which she had reverse shoulder arthroplasty on 01/11/16.   PAST MEDICAL HISTORY:  Past Medical History:  Diagnosis Date  . Allergy   . Arthritis   . Cataract    Bilateral  . Headache   . History of shingles   . Hyperlipidemia   . Hypertension      CURRENT MEDICATIONS: Reviewed  Patient's Medications  New Prescriptions   No medications on file  Previous Medications   ASPIRIN-ACETAMINOPHEN-CAFFEINE (EXCEDRIN EXTRA STRENGTH PO)    Take 1 tablet by mouth 2 (two) times daily as needed (pain).    ATORVASTATIN (LIPITOR) 10 MG TABLET    Take 1 tablet (10 mg total) by mouth at bedtime. For high cholesterol   CELECOXIB (CELEBREX) 200 MG CAPSULE    Take 1 capsule (200 mg total) by mouth daily.   CETIRIZINE (ZYRTEC ALLERGY) 10 MG TABLET    Take 1 tablet (10 mg total) by mouth at bedtime.   DIAZEPAM (VALIUM) 2 MG TABLET    Take 1 tablet (2 mg total) by mouth every 6 (six) hours as needed for anxiety.   HYDROCORTISONE CREAM 1 %    Apply 1 application topically 2 (two) times daily. Apply to rash on bilateral thighs and lower back   MONTELUKAST (SINGULAIR) 10 MG TABLET    Take 1 tablet (10 mg total) by mouth at bedtime.   MULTIPLE VITAMINS-MINERALS (CENTRUM SILVER) TABLET    Take 1 tablet by mouth daily.   ONDANSETRON (ZOFRAN) 4 MG TABLET    Take 1 tablet (4 mg total) by mouth every 8 (eight) hours as needed for nausea or vomiting.   OXYCODONE-ACETAMINOPHEN (PERCOCET) 5-325 MG TABLET    Take 1-2 tablets by mouth every 4 (four) hours as needed.   POLYETHYLENE GLYCOL (MIRALAX / GLYCOLAX) PACKET    Take 17 g by mouth daily.   SENNA-DOCUSATE (SENOKOT-S) 8.6-50 MG PER TABLET    Take 2 tablets by mouth 2 (two) times daily. For constipation    TRAMADOL (ULTRAM) 50 MG TABLET    Take 1 tablet (50 mg total) by mouth 3 (three) times daily.  Modified Medications   No medications on file  Discontinued Medications   DIPHENHYDRAMINE (BENADRYL) 25 MG TABLET    Take 25 mg by mouth every 8 (  eight) hours as needed for itching.     Allergies  Allergen Reactions  . No Known Allergies      REVIEW OF SYSTEMS:  GENERAL: no change in appetite, no fatigue, no weight changes, no fever, chills or weakness EYES: Denies change in vision, dry eyes, eye pain, itching or discharge EARS: Denies change in hearing, ringing in ears, or earache NOSE: Denies nasal congestion or epistaxis MOUTH and THROAT: Denies oral discomfort, gingival pain or bleeding, pain from teeth or hoarseness   RESPIRATORY: no cough, SOB, DOE, wheezing, hemoptysis CARDIAC: no chest pain, edema or palpitations GI: no abdominal pain, diarrhea, constipation, heart burn, nausea or vomiting GU: Denies dysuria, frequency, hematuria, incontinence, or discharge PSYCHIATRIC: Denies feeling of depression or anxiety. No report of hallucinations,  insomnia, paranoia, or agitation   PHYSICAL EXAMINATION  GENERAL APPEARANCE: Well nourished. In no acute distress. Normal body habitus SKIN:  Right shoulder surgical incision has dry glue, no erythema HEAD: Normal in size and contour. No evidence of trauma EYES: Lids open and close normally. No blepharitis, entropion or ectropion. PERRL. Conjunctivae are clear and sclerae are white. Lenses are without opacity EARS: Pinnae are normal. Patient hears normal voice tunes of the examiner MOUTH and THROAT: Lips are without lesions. Oral mucosa is moist and without lesions. Tongue is normal in shape, size, and color and without lesions NECK: supple, trachea midline, no neck masses, no thyroid tenderness, no thyromegaly LYMPHATICS: no LAN in the neck, no supraclavicular LAN RESPIRATORY: breathing is even & unlabored, BS CTAB CARDIAC: RRR, no murmur,no extra heart sounds, no edema GI: abdomen soft, normal BS, no masses, no tenderness, no hepatomegaly, no splenomegaly EXTREMITIES:  Able to move X 4 extremities; limited ROM to right shoulder due to surgery; has right shoulder sling PSYCHIATRIC: Alert and oriented X 3. Affect and behavior are appropriate  LABS/RADIOLOGY: Labs reviewed: Basic Metabolic Panel:  Recent Labs  05/01/15 1111 10/24/15 1115 01/08/16 1029 01/22/16 1313 01/24/16 1232  NA 139 139 141 143 142  K 4.5 4.5 4.8 5.5* 4.5  CL 104 104 105  --   --   CO2 28 31 29   --   --   GLUCOSE 101* 92 99  --   --   BUN 20 19 19 17 13   CREATININE 0.68 0.57 0.49 0.5 0.5  CALCIUM 10.2 10.3 10.1  --   --    Liver Function Tests:  Recent Labs  05/01/15 1111 10/24/15 1115 01/22/16 1313  AST 34 29 20  ALT 23 20 23   ALKPHOS 77 73 117  BILITOT 0.3 0.3  --   PROT 6.7 6.7  --   ALBUMIN 3.8 3.8  --    CBC:  Recent Labs  05/01/15 1111 12/12/15 1717 01/08/16 1029 01/22/16 1313  WBC 8.6 8.3 8.0 6.7  NEUTROABS 7.0 5.6  --  4  HGB 12.4 12.3 12.0 10.1*  HCT 38.4 37.9 38.8 33*  MCV  81.7 82.3 84.3  --   PLT 274.0 267.0 239 430*   Lipid Panel:  Recent Labs  05/01/15 1111 10/24/15 1115  HDL 51.00 52.20     ASSESSMENT/PLAN:  Right shoulder arthropathy S/P reverse shoulder arthroplasty - continue rehabilitation, PT and OT; continue Percocet 5/325 mg 1-2 tabs by mouth every 4 hours when necessary and tramadol 50 mg 1 tab by mouth 3 times a day and Exedrin extra strength 1 tab by mouth twice a day when necessary for pain; follow-up with orthopedic surgeon  Rash - continue hydrocortisone  1% cream to rash on bilateral thighs and lower back twice a day, stop date 02/13/16  Constipation - continue MiraLAX 17 g by mouth daily and senna S 2 tabs by mouth twice a day  Hyperlipidemia - continue Lipitor 10 mg 1 tab by mouth daily at bedtime Lab Results  Component Value Date   CHOL 162 10/24/2015   HDL 52.20 10/24/2015   LDLCALC 89 10/24/2015   TRIG 104.0 10/24/2015   CHOLHDL 3 10/24/2015   Anxiety - mood is stable; continue volume 2 mg 1 tab by mouth every 6 hours when necessary  Hypertension - off medications, well-controlled     Goals of care:  Short-term rehabilitation    Durenda Age, NP [place] [phone]

## 2016-02-21 ENCOUNTER — Telehealth: Payer: Self-pay | Admitting: *Deleted

## 2016-02-21 DIAGNOSIS — M19012 Primary osteoarthritis, left shoulder: Secondary | ICD-10-CM | POA: Diagnosis not present

## 2016-02-21 DIAGNOSIS — Z96611 Presence of right artificial shoulder joint: Secondary | ICD-10-CM | POA: Diagnosis not present

## 2016-02-21 DIAGNOSIS — Z471 Aftercare following joint replacement surgery: Secondary | ICD-10-CM | POA: Diagnosis not present

## 2016-02-21 NOTE — Telephone Encounter (Signed)
Received accepted letter until (02/15/2017) for Celebrex 200mg  via fax from [employer].  Letter will be sent to be scanned.//AB/CMA

## 2016-02-23 DIAGNOSIS — Z471 Aftercare following joint replacement surgery: Secondary | ICD-10-CM | POA: Diagnosis not present

## 2016-02-23 DIAGNOSIS — E785 Hyperlipidemia, unspecified: Secondary | ICD-10-CM | POA: Diagnosis not present

## 2016-02-23 DIAGNOSIS — S46001D Unspecified injury of muscle(s) and tendon(s) of the rotator cuff of right shoulder, subsequent encounter: Secondary | ICD-10-CM | POA: Diagnosis not present

## 2016-02-23 DIAGNOSIS — M6281 Muscle weakness (generalized): Secondary | ICD-10-CM | POA: Diagnosis not present

## 2016-02-23 DIAGNOSIS — M12811 Other specific arthropathies, not elsewhere classified, right shoulder: Secondary | ICD-10-CM | POA: Diagnosis not present

## 2016-02-23 DIAGNOSIS — K5900 Constipation, unspecified: Secondary | ICD-10-CM | POA: Diagnosis not present

## 2016-02-23 DIAGNOSIS — F411 Generalized anxiety disorder: Secondary | ICD-10-CM | POA: Diagnosis not present

## 2016-02-23 DIAGNOSIS — J309 Allergic rhinitis, unspecified: Secondary | ICD-10-CM | POA: Diagnosis not present

## 2016-02-23 DIAGNOSIS — Z96611 Presence of right artificial shoulder joint: Secondary | ICD-10-CM | POA: Diagnosis not present

## 2016-02-23 DIAGNOSIS — M1990 Unspecified osteoarthritis, unspecified site: Secondary | ICD-10-CM | POA: Diagnosis not present

## 2016-02-23 DIAGNOSIS — I10 Essential (primary) hypertension: Secondary | ICD-10-CM | POA: Diagnosis not present

## 2016-02-23 DIAGNOSIS — R2681 Unsteadiness on feet: Secondary | ICD-10-CM | POA: Diagnosis not present

## 2016-02-27 NOTE — Telephone Encounter (Signed)
Detailed message left advising Celebrex has arrived and it will be left at check in.     KP

## 2016-03-06 ENCOUNTER — Encounter: Payer: Self-pay | Admitting: Adult Health

## 2016-03-06 ENCOUNTER — Non-Acute Institutional Stay (SKILLED_NURSING_FACILITY): Payer: Medicare Other | Admitting: Adult Health

## 2016-03-06 DIAGNOSIS — J309 Allergic rhinitis, unspecified: Secondary | ICD-10-CM | POA: Diagnosis not present

## 2016-03-06 DIAGNOSIS — F411 Generalized anxiety disorder: Secondary | ICD-10-CM

## 2016-03-06 DIAGNOSIS — M1990 Unspecified osteoarthritis, unspecified site: Secondary | ICD-10-CM

## 2016-03-06 DIAGNOSIS — M12811 Other specific arthropathies, not elsewhere classified, right shoulder: Secondary | ICD-10-CM | POA: Diagnosis not present

## 2016-03-06 DIAGNOSIS — M75101 Unspecified rotator cuff tear or rupture of right shoulder, not specified as traumatic: Principal | ICD-10-CM

## 2016-03-06 DIAGNOSIS — K5900 Constipation, unspecified: Secondary | ICD-10-CM | POA: Diagnosis not present

## 2016-03-06 DIAGNOSIS — I10 Essential (primary) hypertension: Secondary | ICD-10-CM

## 2016-03-06 DIAGNOSIS — E785 Hyperlipidemia, unspecified: Secondary | ICD-10-CM

## 2016-03-06 DIAGNOSIS — K5909 Other constipation: Secondary | ICD-10-CM

## 2016-03-06 NOTE — Progress Notes (Signed)
Patient ID: Katherine Owen, female   DOB: 1928-05-07, 82 y.o.   MRN: [id number]    DATE:  03/06/2016   MRN:  [id number]  BIRTHDAY: 06/13/1928  Facility:  Nursing Home Location:  [place] Room Number: [id number]  LEVEL OF CARE:  SNF [phone])  Contact Information    Name [place] Son [phone]   Makris,Jody Daughter [phone]  [phone]       Code Status History    Date Active Date Inactive Code Status Order ID Comments User Context   01/11/2016  3:23 PM 01/14/2016  5:13 PM Full Code [id number]  Jenetta Loges, PA-C Inpatient       Chief Complaint  Patient presents with  . Medical Management of Chronic Issues    HISTORY OF PRESENT ILLNESS:  This is an 82 year old female who is being seen for a routine visit. She is currently having a short-term rehabilitation @ [facility]. BPs has been well-controlled-  104/50, 110/74, 134/78 and 126/68.  She has been admitted to [facility] on 01/14/16 from [facility] with right shoulder rotator cuff arthropathy for which she had reverse shoulder arthroplasty on 01/11/16.   PAST MEDICAL HISTORY:  Past Medical History:  Diagnosis Date  . Allergy   . Arthritis   . Cataract    Bilateral  . Headache   . History of shingles   . Hyperlipidemia   . Hypertension      CURRENT MEDICATIONS: Reviewed  Patient's Medications  New Prescriptions   No medications on file  Previous Medications   ASPIRIN-ACETAMINOPHEN-CAFFEINE (EXCEDRIN EXTRA STRENGTH PO)    Take 1 tablet by mouth 2 (two) times daily as needed (pain).    ATORVASTATIN (LIPITOR) 10 MG TABLET    Take 1 tablet (10 mg total) by mouth at bedtime. For high cholesterol   CELECOXIB (CELEBREX) 200 MG CAPSULE    Take 1 capsule (200 mg total) by mouth daily.   CETIRIZINE (ZYRTEC ALLERGY) 10 MG TABLET    Take 1 tablet (10 mg total) by mouth at bedtime.   DIAZEPAM (VALIUM) 2 MG TABLET    Take  1 tablet (2 mg total) by mouth every 6 (six) hours as needed for anxiety.   MONTELUKAST (SINGULAIR) 10 MG TABLET    Take 1 tablet (10 mg total) by mouth at bedtime.   MULTIPLE VITAMINS-MINERALS (CENTRUM SILVER) TABLET    Take 1 tablet by mouth daily.   ONDANSETRON (ZOFRAN) 4 MG TABLET    Take 1 tablet (4 mg total) by mouth every 8 (eight) hours as needed for nausea or vomiting.   OXYCODONE-ACETAMINOPHEN (PERCOCET) 5-325 MG TABLET    Take 1-2 tablets by mouth every 4 (four) hours as needed.   POLYETHYLENE GLYCOL (MIRALAX / GLYCOLAX) PACKET    Take 17 g by mouth daily.   SENNA-DOCUSATE (SENOKOT-S) 8.6-50 MG PER TABLET    Take 2 tablets by mouth 2 (two) times daily. For constipation    TRAMADOL (ULTRAM) 50 MG TABLET    Take 1 tablet (50 mg total) by mouth 3 (three) times daily.  Modified Medications   No medications on file  Discontinued Medications   No medications on file     Allergies  Allergen Reactions  . No Known Allergies      REVIEW OF SYSTEMS:  GENERAL: no change in appetite, no fatigue, no weight changes, no fever, chills or weakness EYES: Denies change in vision, dry eyes, eye pain,  itching or discharge EARS: Denies change in hearing, ringing in ears, or earache NOSE: Denies nasal congestion or epistaxis MOUTH and THROAT: Denies oral discomfort, gingival pain or bleeding, pain from teeth or hoarseness   RESPIRATORY: no cough, SOB, DOE, wheezing, hemoptysis CARDIAC: no chest pain, edema or palpitations GI: no abdominal pain, diarrhea, constipation, heart burn, nausea or vomiting GU: Denies dysuria, frequency, hematuria, incontinence, or discharge PSYCHIATRIC: Denies feeling of depression or anxiety. No report of hallucinations, insomnia, paranoia, or agitation   PHYSICAL EXAMINATION  GENERAL APPEARANCE: Well nourished. In no acute distress. Normal body habitus SKIN:  Right shoulder surgical incision is healed HEAD: Normal in size and contour. No evidence of trauma EYES:  Lids open and close normally. No blepharitis, entropion or ectropion. PERRL. Conjunctivae are clear and sclerae are white. Lenses are without opacity EARS: Pinnae are normal. Patient hears normal voice tunes of the examiner MOUTH and THROAT: Lips are without lesions. Oral mucosa is moist and without lesions. Tongue is normal in shape, size, and color and without lesions NECK: supple, trachea midline, no neck masses, no thyroid tenderness, no thyromegaly LYMPHATICS: no LAN in the neck, no supraclavicular LAN RESPIRATORY: breathing is even & unlabored, BS CTAB CARDIAC: RRR, no murmur,no extra heart sounds, no edema GI: abdomen soft, normal BS, no masses, no tenderness, no hepatomegaly, no splenomegaly EXTREMITIES:  Able to move X 4 extremities PSYCHIATRIC: Alert and oriented X 3. Affect and behavior are appropriate  LABS/RADIOLOGY: Labs reviewed: Basic Metabolic Panel:  Recent Labs  05/01/15 1111 10/24/15 1115 01/08/16 1029 01/22/16 1313 01/24/16 1232  NA 139 139 141 143 142  K 4.5 4.5 4.8 5.5* 4.5  CL 104 104 105  --   --   CO2 28 31 29   --   --   GLUCOSE 101* 92 99  --   --   BUN 20 19 19 17 13   CREATININE 0.68 0.57 0.49 0.5 0.5  CALCIUM 10.2 10.3 10.1  --   --    Liver Function Tests:  Recent Labs  05/01/15 1111 10/24/15 1115 01/22/16 1313  AST 34 29 20  ALT 23 20 23   ALKPHOS 77 73 117  BILITOT 0.3 0.3  --   PROT 6.7 6.7  --   ALBUMIN 3.8 3.8  --    CBC:  Recent Labs  05/01/15 1111 12/12/15 1717 01/08/16 1029 01/22/16 1313  WBC 8.6 8.3 8.0 6.7  NEUTROABS 7.0 5.6  --  4  HGB 12.4 12.3 12.0 10.1*  HCT 38.4 37.9 38.8 33*  MCV 81.7 82.3 84.3  --   PLT 274.0 267.0 239 430*   Lipid Panel:  Recent Labs  05/01/15 1111 10/24/15 1115  HDL 51.00 52.20     ASSESSMENT/PLAN:  Right shoulder arthropathy S/P reverse shoulder arthroplasty - continue rehabilitation, PT and OT; continue Percocet 5/325 mg 1-2 tabs by mouth every 4 hours when necessary and tramadol  50 mg 1 tab by mouth 3 times a day and Exedrin extra strength 1 tab by mouth twice a day when necessary for pain; follow-up with orthopedic surgeon  Constipation - continue MiraLAX 17 g by mouth daily and senna S 2 tabs by mouth twice a day  Hyperlipidemia - continue Lipitor 10 mg 1 tab by mouth daily at bedtime Lab Results  Component Value Date   CHOL 162 10/24/2015   HDL 52.20 10/24/2015   LDLCALC 89 10/24/2015   TRIG 104.0 10/24/2015   CHOLHDL 3 10/24/2015   Anxiety - mood is stable;  continue volume 2 mg 1 tab by mouth every 6 hours when necessary  Hypertension - off medications, well-controlled  Allergic Rhinitis - continue Zyrtec 10 mg 1 tab PO Q HS  Osteoarthritis - continue Celebrex 200 mg 1 capsule daily     Goals of care:  Short-term rehabilitation    Durenda Age, NP [place]

## 2016-03-13 ENCOUNTER — Non-Acute Institutional Stay (SKILLED_NURSING_FACILITY): Payer: Medicare Other | Admitting: Adult Health

## 2016-03-13 ENCOUNTER — Encounter: Payer: Self-pay | Admitting: Adult Health

## 2016-03-13 DIAGNOSIS — F411 Generalized anxiety disorder: Secondary | ICD-10-CM

## 2016-03-13 DIAGNOSIS — M1990 Unspecified osteoarthritis, unspecified site: Secondary | ICD-10-CM | POA: Diagnosis not present

## 2016-03-13 DIAGNOSIS — E785 Hyperlipidemia, unspecified: Secondary | ICD-10-CM | POA: Diagnosis not present

## 2016-03-13 DIAGNOSIS — I10 Essential (primary) hypertension: Secondary | ICD-10-CM

## 2016-03-13 DIAGNOSIS — M12811 Other specific arthropathies, not elsewhere classified, right shoulder: Secondary | ICD-10-CM

## 2016-03-13 DIAGNOSIS — J309 Allergic rhinitis, unspecified: Secondary | ICD-10-CM | POA: Diagnosis not present

## 2016-03-13 DIAGNOSIS — K5900 Constipation, unspecified: Secondary | ICD-10-CM | POA: Diagnosis not present

## 2016-03-13 DIAGNOSIS — M75101 Unspecified rotator cuff tear or rupture of right shoulder, not specified as traumatic: Principal | ICD-10-CM

## 2016-03-13 NOTE — Progress Notes (Signed)
Patient ID: Katherine Owen, female   DOB: July 31, 1927, 82 y.o.   MRN: [id number]    DATE:  03/13/2016   MRN:  [id number]  BIRTHDAY: [birth year]  Facility:  Nursing Home Location:  [place] Room Number: [id number]  LEVEL OF CARE:  SNF [phone])  Contact Information    Name Relation Home Work [place] Son [phone]   Donalson,Jody Daughter [phone]  [phone]       Code Status History    Date Active Date Inactive Code Status Order ID Comments User Context   01/11/2016  3:23 PM 01/14/2016  5:13 PM Full Code [id number]  Jenetta Loges, PA-C Inpatient       Chief Complaint  Patient presents with  . Discharge Note    HISTORY OF PRESENT ILLNESS:  This is an 82 year old female who is for discharge home with Home health PT, OT and CNA. DME:  Sliding board and bedside commode  .  She has been admitted to [facility] on 01/14/16 from [facility] with right shoulder rotator cuff arthropathy for which she had reverse shoulder arthroplasty on 01/11/16.  Patient was admitted to this facility for short-term rehabilitation after the patient's recent hospitalization.  Patient has completed SNF rehabilitation and therapy has cleared the patient for discharge.   PAST MEDICAL HISTORY:  Past Medical History:  Diagnosis Date  . Allergy   . Arthritis   . Cataract    Bilateral  . Headache   . History of shingles   . Hyperlipidemia   . Hypertension      CURRENT MEDICATIONS: Reviewed  Patient's Medications  New Prescriptions   No medications on file  Previous Medications   ASPIRIN-ACETAMINOPHEN-CAFFEINE (EXCEDRIN EXTRA STRENGTH PO)    Take 1 tablet by mouth 2 (two) times daily as needed (pain).    ATORVASTATIN (LIPITOR) 10 MG TABLET    Take 1 tablet (10 mg total) by mouth at bedtime. For high cholesterol   CELECOXIB (CELEBREX) 200 MG CAPSULE    Take 1 capsule (200 mg total) by mouth daily.   CETIRIZINE (ZYRTEC  ALLERGY) 10 MG TABLET    Take 1 tablet (10 mg total) by mouth at bedtime.   DIAZEPAM (VALIUM) 2 MG TABLET    Take 1 tablet (2 mg total) by mouth every 6 (six) hours as needed for anxiety.   MONTELUKAST (SINGULAIR) 10 MG TABLET    Take 1 tablet (10 mg total) by mouth at bedtime.   MULTIPLE VITAMINS-MINERALS (CENTRUM SILVER) TABLET    Take 1 tablet by mouth daily.   ONDANSETRON (ZOFRAN) 4 MG TABLET    Take 1 tablet (4 mg total) by mouth every 8 (eight) hours as needed for nausea or vomiting.   OXYCODONE-ACETAMINOPHEN (PERCOCET) 5-325 MG TABLET    Take 1-2 tablets by mouth every 4 (four) hours as needed.   POLYETHYLENE GLYCOL (MIRALAX / GLYCOLAX) PACKET    Take 17 g by mouth daily.   SENNA-DOCUSATE (SENOKOT-S) 8.6-50 MG PER TABLET    Take 2 tablets by mouth 2 (two) times daily. For constipation    TRAMADOL (ULTRAM) 50 MG TABLET    Take 1 tablet (50 mg total) by mouth 3 (three) times daily.  Modified Medications   No medications on file  Discontinued Medications   No medications on file     Allergies  Allergen Reactions  . No Known Allergies      REVIEW OF SYSTEMS:  GENERAL: no change in appetite,  no fatigue, no weight changes, no fever, chills or weakness EYES: Denies change in vision, dry eyes, eye pain, itching or discharge EARS: Denies change in hearing, ringing in ears, or earache NOSE: Denies nasal congestion or epistaxis MOUTH and THROAT: Denies oral discomfort, gingival pain or bleeding, pain from teeth or hoarseness   RESPIRATORY: no cough, SOB, DOE, wheezing, hemoptysis CARDIAC: no chest pain, edema or palpitations GI: no abdominal pain, diarrhea, constipation, heart burn, nausea or vomiting GU: Denies dysuria, frequency, hematuria, incontinence, or discharge PSYCHIATRIC: Denies feeling of depression or anxiety. No report of hallucinations, insomnia, paranoia, or agitation   PHYSICAL EXAMINATION  GENERAL APPEARANCE: Well nourished. In no acute distress. Normal body  habitus SKIN:  Right shoulder surgical incision is healed HEAD: Normal in size and contour. No evidence of trauma EYES: Lids open and close normally. No blepharitis, entropion or ectropion. PERRL. Conjunctivae are clear and sclerae are white. Lenses are without opacity EARS: Pinnae are normal. Patient hears normal voice tunes of the examiner MOUTH and THROAT: Lips are without lesions. Oral mucosa is moist and without lesions. Tongue is normal in shape, size, and color and without lesions NECK: supple, trachea midline, no neck masses, no thyroid tenderness, no thyromegaly LYMPHATICS: no LAN in the neck, no supraclavicular LAN RESPIRATORY: breathing is even & unlabored, BS CTAB CARDIAC: RRR, no murmur,no extra heart sounds, no edema GI: abdomen soft, normal BS, no masses, no tenderness, no hepatomegaly, no splenomegaly EXTREMITIES:  Able to move X 4 extremities PSYCHIATRIC: Alert and oriented X 3. Affect and behavior are appropriate  LABS/RADIOLOGY: Labs reviewed: Basic Metabolic Panel:  Recent Labs  05/01/15 1111 10/24/15 1115 01/08/16 1029 01/22/16 1313 01/24/16 1232  NA 139 139 141 143 142  K 4.5 4.5 4.8 5.5* 4.5  CL 104 104 105  --   --   CO2 28 31 29   --   --   GLUCOSE 101* 92 99  --   --   BUN 20 19 19 17 13   CREATININE 0.68 0.57 0.49 0.5 0.5  CALCIUM 10.2 10.3 10.1  --   --    Liver Function Tests:  Recent Labs  05/01/15 1111 10/24/15 1115 01/22/16 1313  AST 34 29 20  ALT 23 20 23   ALKPHOS 77 73 117  BILITOT 0.3 0.3  --   PROT 6.7 6.7  --   ALBUMIN 3.8 3.8  --    CBC:  Recent Labs  05/01/15 1111 12/12/15 1717 01/08/16 1029 01/22/16 1313  WBC 8.6 8.3 8.0 6.7  NEUTROABS 7.0 5.6  --  4  HGB 12.4 12.3 12.0 10.1*  HCT 38.4 37.9 38.8 33*  MCV 81.7 82.3 84.3  --   PLT 274.0 267.0 239 430*   Lipid Panel:  Recent Labs  05/01/15 1111 10/24/15 1115  HDL 51.00 52.20     ASSESSMENT/PLAN:  Right shoulder arthropathy S/P reverse shoulder arthroplasty -  for Home health CNA,, PT and OT; continue Percocet 5/325 mg 1-2 tabs by mouth every 4 hours when necessary and tramadol 50 mg 1 tab by mouth 3 times a day and Exedrin extra strength 1 tab by mouth twice a day when necessary for pain; follow-up with orthopedic surgeon  Constipation - continue MiraLAX 17 g by mouth daily and senna S 2 tabs by mouth twice a day  Hyperlipidemia - continue Lipitor 10 mg 1 tab by mouth daily at bedtime Lab Results  Component Value Date   CHOL 162 10/24/2015   HDL 52.20 10/24/2015  LDLCALC 89 10/24/2015   TRIG 104.0 10/24/2015   CHOLHDL 3 10/24/2015   Anxiety - mood is stable; continue volume 2 mg 1 tab by mouth every 6 hours when necessary  Hypertension - off medications, well-controlled  Allergic Rhinitis - continue Zyrtec 10 mg 1 tab PO Q HS  Osteoarthritis - continue Celebrex 200 mg 1 capsule daily      I have filled out patient's discharge paperwork and written prescriptions.  Patient will receive home health PT, OT and CNA.  DME provided:  Sliding board and bedside commode  Total discharge time: Greater/less than 30 minutes Greater than 50% was spent in counseling and coordination of care with the patient.   Discharge time involved coordination of the discharge process with social worker, nursing staff and therapy department. Medical justification for home health services/DME verified.     Durenda Age, NP [employer] [phone]

## 2016-03-17 DIAGNOSIS — E785 Hyperlipidemia, unspecified: Secondary | ICD-10-CM | POA: Diagnosis not present

## 2016-03-17 DIAGNOSIS — S46001D Unspecified injury of muscle(s) and tendon(s) of the rotator cuff of right shoulder, subsequent encounter: Secondary | ICD-10-CM | POA: Diagnosis not present

## 2016-03-17 DIAGNOSIS — Z96641 Presence of right artificial hip joint: Secondary | ICD-10-CM | POA: Diagnosis not present

## 2016-03-17 DIAGNOSIS — Z96611 Presence of right artificial shoulder joint: Secondary | ICD-10-CM | POA: Diagnosis not present

## 2016-03-17 DIAGNOSIS — F411 Generalized anxiety disorder: Secondary | ICD-10-CM | POA: Diagnosis not present

## 2016-03-17 DIAGNOSIS — M1990 Unspecified osteoarthritis, unspecified site: Secondary | ICD-10-CM | POA: Diagnosis not present

## 2016-03-17 DIAGNOSIS — Z471 Aftercare following joint replacement surgery: Secondary | ICD-10-CM | POA: Diagnosis not present

## 2016-03-17 DIAGNOSIS — Z96642 Presence of left artificial hip joint: Secondary | ICD-10-CM | POA: Diagnosis not present

## 2016-03-17 DIAGNOSIS — I10 Essential (primary) hypertension: Secondary | ICD-10-CM | POA: Diagnosis not present

## 2016-03-18 DIAGNOSIS — Z96611 Presence of right artificial shoulder joint: Secondary | ICD-10-CM | POA: Diagnosis not present

## 2016-03-18 DIAGNOSIS — S46001D Unspecified injury of muscle(s) and tendon(s) of the rotator cuff of right shoulder, subsequent encounter: Secondary | ICD-10-CM | POA: Diagnosis not present

## 2016-03-18 DIAGNOSIS — M1990 Unspecified osteoarthritis, unspecified site: Secondary | ICD-10-CM | POA: Diagnosis not present

## 2016-03-18 DIAGNOSIS — Z471 Aftercare following joint replacement surgery: Secondary | ICD-10-CM | POA: Diagnosis not present

## 2016-03-18 DIAGNOSIS — I10 Essential (primary) hypertension: Secondary | ICD-10-CM | POA: Diagnosis not present

## 2016-03-18 DIAGNOSIS — F411 Generalized anxiety disorder: Secondary | ICD-10-CM | POA: Diagnosis not present

## 2016-03-20 DIAGNOSIS — F411 Generalized anxiety disorder: Secondary | ICD-10-CM | POA: Diagnosis not present

## 2016-03-20 DIAGNOSIS — S46001D Unspecified injury of muscle(s) and tendon(s) of the rotator cuff of right shoulder, subsequent encounter: Secondary | ICD-10-CM | POA: Diagnosis not present

## 2016-03-20 DIAGNOSIS — Z471 Aftercare following joint replacement surgery: Secondary | ICD-10-CM | POA: Diagnosis not present

## 2016-03-20 DIAGNOSIS — I10 Essential (primary) hypertension: Secondary | ICD-10-CM | POA: Diagnosis not present

## 2016-03-20 DIAGNOSIS — M1990 Unspecified osteoarthritis, unspecified site: Secondary | ICD-10-CM | POA: Diagnosis not present

## 2016-03-20 DIAGNOSIS — Z96611 Presence of right artificial shoulder joint: Secondary | ICD-10-CM | POA: Diagnosis not present

## 2016-03-21 DIAGNOSIS — S46001D Unspecified injury of muscle(s) and tendon(s) of the rotator cuff of right shoulder, subsequent encounter: Secondary | ICD-10-CM | POA: Diagnosis not present

## 2016-03-21 DIAGNOSIS — Z96611 Presence of right artificial shoulder joint: Secondary | ICD-10-CM | POA: Diagnosis not present

## 2016-03-21 DIAGNOSIS — Z471 Aftercare following joint replacement surgery: Secondary | ICD-10-CM | POA: Diagnosis not present

## 2016-03-21 DIAGNOSIS — F411 Generalized anxiety disorder: Secondary | ICD-10-CM | POA: Diagnosis not present

## 2016-03-21 DIAGNOSIS — M1990 Unspecified osteoarthritis, unspecified site: Secondary | ICD-10-CM | POA: Diagnosis not present

## 2016-03-21 DIAGNOSIS — I10 Essential (primary) hypertension: Secondary | ICD-10-CM | POA: Diagnosis not present

## 2016-03-22 DIAGNOSIS — F411 Generalized anxiety disorder: Secondary | ICD-10-CM | POA: Diagnosis not present

## 2016-03-22 DIAGNOSIS — I10 Essential (primary) hypertension: Secondary | ICD-10-CM | POA: Diagnosis not present

## 2016-03-22 DIAGNOSIS — Z96611 Presence of right artificial shoulder joint: Secondary | ICD-10-CM | POA: Diagnosis not present

## 2016-03-22 DIAGNOSIS — Z471 Aftercare following joint replacement surgery: Secondary | ICD-10-CM | POA: Diagnosis not present

## 2016-03-22 DIAGNOSIS — S46001D Unspecified injury of muscle(s) and tendon(s) of the rotator cuff of right shoulder, subsequent encounter: Secondary | ICD-10-CM | POA: Diagnosis not present

## 2016-03-22 DIAGNOSIS — M1990 Unspecified osteoarthritis, unspecified site: Secondary | ICD-10-CM | POA: Diagnosis not present

## 2016-03-25 DIAGNOSIS — S46001D Unspecified injury of muscle(s) and tendon(s) of the rotator cuff of right shoulder, subsequent encounter: Secondary | ICD-10-CM | POA: Diagnosis not present

## 2016-03-25 DIAGNOSIS — M1990 Unspecified osteoarthritis, unspecified site: Secondary | ICD-10-CM | POA: Diagnosis not present

## 2016-03-25 DIAGNOSIS — F411 Generalized anxiety disorder: Secondary | ICD-10-CM | POA: Diagnosis not present

## 2016-03-25 DIAGNOSIS — I10 Essential (primary) hypertension: Secondary | ICD-10-CM | POA: Diagnosis not present

## 2016-03-25 DIAGNOSIS — Z96611 Presence of right artificial shoulder joint: Secondary | ICD-10-CM | POA: Diagnosis not present

## 2016-03-25 DIAGNOSIS — Z471 Aftercare following joint replacement surgery: Secondary | ICD-10-CM | POA: Diagnosis not present

## 2016-03-26 DIAGNOSIS — F411 Generalized anxiety disorder: Secondary | ICD-10-CM | POA: Diagnosis not present

## 2016-03-26 DIAGNOSIS — Z96611 Presence of right artificial shoulder joint: Secondary | ICD-10-CM | POA: Diagnosis not present

## 2016-03-26 DIAGNOSIS — Z471 Aftercare following joint replacement surgery: Secondary | ICD-10-CM | POA: Diagnosis not present

## 2016-03-26 DIAGNOSIS — S46001D Unspecified injury of muscle(s) and tendon(s) of the rotator cuff of right shoulder, subsequent encounter: Secondary | ICD-10-CM | POA: Diagnosis not present

## 2016-03-26 DIAGNOSIS — M1990 Unspecified osteoarthritis, unspecified site: Secondary | ICD-10-CM | POA: Diagnosis not present

## 2016-03-26 DIAGNOSIS — I10 Essential (primary) hypertension: Secondary | ICD-10-CM | POA: Diagnosis not present

## 2016-03-27 ENCOUNTER — Telehealth: Payer: Self-pay | Admitting: Family Medicine

## 2016-03-27 DIAGNOSIS — I10 Essential (primary) hypertension: Secondary | ICD-10-CM | POA: Diagnosis not present

## 2016-03-27 DIAGNOSIS — Z96611 Presence of right artificial shoulder joint: Secondary | ICD-10-CM | POA: Diagnosis not present

## 2016-03-27 DIAGNOSIS — M1990 Unspecified osteoarthritis, unspecified site: Secondary | ICD-10-CM | POA: Diagnosis not present

## 2016-03-27 DIAGNOSIS — Z471 Aftercare following joint replacement surgery: Secondary | ICD-10-CM | POA: Diagnosis not present

## 2016-03-27 DIAGNOSIS — F411 Generalized anxiety disorder: Secondary | ICD-10-CM | POA: Diagnosis not present

## 2016-03-27 DIAGNOSIS — S46001D Unspecified injury of muscle(s) and tendon(s) of the rotator cuff of right shoulder, subsequent encounter: Secondary | ICD-10-CM | POA: Diagnosis not present

## 2016-03-27 NOTE — Telephone Encounter (Signed)
Caller name: [name] with [facility] Can be reached: [phone]   Reason for call: Calling to discuss getting power wheelchair for patient due to limited mobility. He believes pt meets criteria for Medicare. Please call to f/u.

## 2016-03-28 DIAGNOSIS — I10 Essential (primary) hypertension: Secondary | ICD-10-CM | POA: Diagnosis not present

## 2016-03-28 DIAGNOSIS — M1990 Unspecified osteoarthritis, unspecified site: Secondary | ICD-10-CM | POA: Diagnosis not present

## 2016-03-28 DIAGNOSIS — Z471 Aftercare following joint replacement surgery: Secondary | ICD-10-CM | POA: Diagnosis not present

## 2016-03-28 DIAGNOSIS — F411 Generalized anxiety disorder: Secondary | ICD-10-CM | POA: Diagnosis not present

## 2016-03-28 DIAGNOSIS — S46001D Unspecified injury of muscle(s) and tendon(s) of the rotator cuff of right shoulder, subsequent encounter: Secondary | ICD-10-CM | POA: Diagnosis not present

## 2016-03-28 DIAGNOSIS — Z96611 Presence of right artificial shoulder joint: Secondary | ICD-10-CM | POA: Diagnosis not present

## 2016-03-29 DIAGNOSIS — F411 Generalized anxiety disorder: Secondary | ICD-10-CM | POA: Diagnosis not present

## 2016-03-29 DIAGNOSIS — I10 Essential (primary) hypertension: Secondary | ICD-10-CM | POA: Diagnosis not present

## 2016-03-29 DIAGNOSIS — Z471 Aftercare following joint replacement surgery: Secondary | ICD-10-CM | POA: Diagnosis not present

## 2016-03-29 DIAGNOSIS — M1990 Unspecified osteoarthritis, unspecified site: Secondary | ICD-10-CM | POA: Diagnosis not present

## 2016-03-29 DIAGNOSIS — S46001D Unspecified injury of muscle(s) and tendon(s) of the rotator cuff of right shoulder, subsequent encounter: Secondary | ICD-10-CM | POA: Diagnosis not present

## 2016-03-29 DIAGNOSIS — Z96611 Presence of right artificial shoulder joint: Secondary | ICD-10-CM | POA: Diagnosis not present

## 2016-03-29 NOTE — Telephone Encounter (Signed)
[name] stated that he thought the patient would benefit from a power chair, she called and requested one. He is unsure if they will supply the chair but he will check and give [place] a call back. I made him aware the patient would need to have an Office visit and when they are ready to schedule to give [place] a call. He verbalized understanding and will check on the product and then give her a call.     KP

## 2016-04-01 DIAGNOSIS — I10 Essential (primary) hypertension: Secondary | ICD-10-CM | POA: Diagnosis not present

## 2016-04-01 DIAGNOSIS — S46001D Unspecified injury of muscle(s) and tendon(s) of the rotator cuff of right shoulder, subsequent encounter: Secondary | ICD-10-CM | POA: Diagnosis not present

## 2016-04-01 DIAGNOSIS — M1990 Unspecified osteoarthritis, unspecified site: Secondary | ICD-10-CM | POA: Diagnosis not present

## 2016-04-01 DIAGNOSIS — F411 Generalized anxiety disorder: Secondary | ICD-10-CM | POA: Diagnosis not present

## 2016-04-01 DIAGNOSIS — Z471 Aftercare following joint replacement surgery: Secondary | ICD-10-CM | POA: Diagnosis not present

## 2016-04-01 DIAGNOSIS — Z96611 Presence of right artificial shoulder joint: Secondary | ICD-10-CM | POA: Diagnosis not present

## 2016-04-03 DIAGNOSIS — F411 Generalized anxiety disorder: Secondary | ICD-10-CM | POA: Diagnosis not present

## 2016-04-03 DIAGNOSIS — S46001D Unspecified injury of muscle(s) and tendon(s) of the rotator cuff of right shoulder, subsequent encounter: Secondary | ICD-10-CM | POA: Diagnosis not present

## 2016-04-03 DIAGNOSIS — I10 Essential (primary) hypertension: Secondary | ICD-10-CM | POA: Diagnosis not present

## 2016-04-03 DIAGNOSIS — Z471 Aftercare following joint replacement surgery: Secondary | ICD-10-CM | POA: Diagnosis not present

## 2016-04-03 DIAGNOSIS — Z96611 Presence of right artificial shoulder joint: Secondary | ICD-10-CM | POA: Diagnosis not present

## 2016-04-03 DIAGNOSIS — M1990 Unspecified osteoarthritis, unspecified site: Secondary | ICD-10-CM | POA: Diagnosis not present

## 2016-04-04 NOTE — Telephone Encounter (Signed)
[name] [employer] [phone]) called back stating that what is needed is a Rx for a Powered WC faxed to [phone] to get the process started. Asked that message go to Maudie Mercury because he had been speaking with her. This is the [facility]. Fax number.

## 2016-04-05 DIAGNOSIS — I10 Essential (primary) hypertension: Secondary | ICD-10-CM | POA: Diagnosis not present

## 2016-04-05 DIAGNOSIS — Z96611 Presence of right artificial shoulder joint: Secondary | ICD-10-CM | POA: Diagnosis not present

## 2016-04-05 DIAGNOSIS — Z471 Aftercare following joint replacement surgery: Secondary | ICD-10-CM | POA: Diagnosis not present

## 2016-04-05 DIAGNOSIS — M1990 Unspecified osteoarthritis, unspecified site: Secondary | ICD-10-CM | POA: Diagnosis not present

## 2016-04-05 DIAGNOSIS — F411 Generalized anxiety disorder: Secondary | ICD-10-CM | POA: Diagnosis not present

## 2016-04-05 DIAGNOSIS — S46001D Unspecified injury of muscle(s) and tendon(s) of the rotator cuff of right shoulder, subsequent encounter: Secondary | ICD-10-CM | POA: Diagnosis not present

## 2016-04-05 MED ORDER — WHEELCHAIR MISC: 0 refills | Status: DC

## 2016-04-05 NOTE — Addendum Note (Signed)
Addended by: Ewing Schlein on: 04/05/2016 02:57 PM   Modules accepted: Orders

## 2016-04-05 NOTE — Telephone Encounter (Signed)
Ok to send rx

## 2016-04-05 NOTE — Telephone Encounter (Signed)
Please advise      KP 

## 2016-04-05 NOTE — Telephone Encounter (Signed)
Faxed.   KP 

## 2016-04-08 DIAGNOSIS — I10 Essential (primary) hypertension: Secondary | ICD-10-CM | POA: Diagnosis not present

## 2016-04-08 DIAGNOSIS — F411 Generalized anxiety disorder: Secondary | ICD-10-CM | POA: Diagnosis not present

## 2016-04-08 DIAGNOSIS — M1990 Unspecified osteoarthritis, unspecified site: Secondary | ICD-10-CM | POA: Diagnosis not present

## 2016-04-08 DIAGNOSIS — Z471 Aftercare following joint replacement surgery: Secondary | ICD-10-CM | POA: Diagnosis not present

## 2016-04-08 DIAGNOSIS — Z96611 Presence of right artificial shoulder joint: Secondary | ICD-10-CM | POA: Diagnosis not present

## 2016-04-08 DIAGNOSIS — S46001D Unspecified injury of muscle(s) and tendon(s) of the rotator cuff of right shoulder, subsequent encounter: Secondary | ICD-10-CM | POA: Diagnosis not present

## 2016-04-09 DIAGNOSIS — Z471 Aftercare following joint replacement surgery: Secondary | ICD-10-CM | POA: Diagnosis not present

## 2016-04-09 DIAGNOSIS — I10 Essential (primary) hypertension: Secondary | ICD-10-CM | POA: Diagnosis not present

## 2016-04-09 DIAGNOSIS — M1990 Unspecified osteoarthritis, unspecified site: Secondary | ICD-10-CM | POA: Diagnosis not present

## 2016-04-09 DIAGNOSIS — F411 Generalized anxiety disorder: Secondary | ICD-10-CM | POA: Diagnosis not present

## 2016-04-09 DIAGNOSIS — Z96611 Presence of right artificial shoulder joint: Secondary | ICD-10-CM | POA: Diagnosis not present

## 2016-04-09 DIAGNOSIS — S46001D Unspecified injury of muscle(s) and tendon(s) of the rotator cuff of right shoulder, subsequent encounter: Secondary | ICD-10-CM | POA: Diagnosis not present

## 2016-04-10 ENCOUNTER — Telehealth: Payer: Self-pay | Admitting: Family Medicine

## 2016-04-10 DIAGNOSIS — M5104 Intervertebral disc disorders with myelopathy, thoracic region: Secondary | ICD-10-CM

## 2016-04-10 DIAGNOSIS — M5002 Cervical disc disorder with myelopathy, mid-cervical region, unspecified level: Secondary | ICD-10-CM

## 2016-04-10 NOTE — Telephone Encounter (Signed)
Neurologist - Dr. Radford Pax from [facility] says that she was seen by her neurologist and suggested to have a MRI/Cat Scan. She says that she would like to have orders placed at imaging if possible. Please advise.    CB: [phone]

## 2016-04-11 ENCOUNTER — Telehealth: Payer: Self-pay | Admitting: Family Medicine

## 2016-04-11 DIAGNOSIS — Z96611 Presence of right artificial shoulder joint: Secondary | ICD-10-CM | POA: Diagnosis not present

## 2016-04-11 DIAGNOSIS — I10 Essential (primary) hypertension: Secondary | ICD-10-CM | POA: Diagnosis not present

## 2016-04-11 DIAGNOSIS — Z471 Aftercare following joint replacement surgery: Secondary | ICD-10-CM | POA: Diagnosis not present

## 2016-04-11 DIAGNOSIS — M1990 Unspecified osteoarthritis, unspecified site: Secondary | ICD-10-CM | POA: Diagnosis not present

## 2016-04-11 DIAGNOSIS — S46001D Unspecified injury of muscle(s) and tendon(s) of the rotator cuff of right shoulder, subsequent encounter: Secondary | ICD-10-CM | POA: Diagnosis not present

## 2016-04-11 DIAGNOSIS — F411 Generalized anxiety disorder: Secondary | ICD-10-CM | POA: Diagnosis not present

## 2016-04-11 NOTE — Telephone Encounter (Signed)
I am not sure if we should put int he order or wait for PMD. Please advise   KP

## 2016-04-11 NOTE — Telephone Encounter (Signed)
Caller name:Mike/ OT w/ [facility] Relationship to patient: Can be [phone] Pharmacy:  Reason for call:Pt was seen by [facility] for her back, had elevated BP 178/91, patient told them that she use to take Losartan 112.5mg . Please advise

## 2016-04-12 DIAGNOSIS — Z96611 Presence of right artificial shoulder joint: Secondary | ICD-10-CM | POA: Diagnosis not present

## 2016-04-12 DIAGNOSIS — S46001D Unspecified injury of muscle(s) and tendon(s) of the rotator cuff of right shoulder, subsequent encounter: Secondary | ICD-10-CM | POA: Diagnosis not present

## 2016-04-12 DIAGNOSIS — Z471 Aftercare following joint replacement surgery: Secondary | ICD-10-CM | POA: Diagnosis not present

## 2016-04-12 DIAGNOSIS — I10 Essential (primary) hypertension: Secondary | ICD-10-CM | POA: Diagnosis not present

## 2016-04-12 DIAGNOSIS — F411 Generalized anxiety disorder: Secondary | ICD-10-CM | POA: Diagnosis not present

## 2016-04-12 DIAGNOSIS — M1990 Unspecified osteoarthritis, unspecified site: Secondary | ICD-10-CM | POA: Diagnosis not present

## 2016-04-12 NOTE — Telephone Encounter (Signed)
Spoke with patient, patient wanted to know if she can get Rx for Losartan because it was prescribed to her a little while back from her previous provider. Pt visit [facility] and she had an elevated b/p, I advise the patient she would have to be evaluted by an provider here since her provider is out of the office. Patient states she understands and will call back to make an appointment to come in for an acute visit. Patient had no further question or concerns at this time.

## 2016-04-12 NOTE — Telephone Encounter (Signed)
Katherine Owen- can you please call her and try to get more details.  Is she talking about an MRI or CT of her head?  If she can tell us who she saw from neurology we could request records (or I can call them) and try to figure this out.   Thank you! [place]

## 2016-04-12 NOTE — Telephone Encounter (Signed)
Spoke with patient and she stated she that the Neurologist wants to order: She has a script for it.   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 M51.04  She also wants and MRI brain w/wo contrast. Dx: Cervical disk disorder with myopathy mid cervical region M50.020  MRI C-spine w/o contrast.Cervical disk disorder with myopathy mid cervical region M50.020.   Please advise     KP

## 2016-04-12 NOTE — Telephone Encounter (Signed)
Spoke with patient and she stated her BP was elevated once, she said she has not had a high blood pressure since, she said she is not taking the Losartan 100-12.5 mg because the medication is expired. Her BP was 121/ over 70 something and the OT said it was fine. She wanted to know if she should start taking it again. She will schedule the follow up once I call her on Monday. She is asymptomatic at this time.  Please advise    KP

## 2016-04-15 ENCOUNTER — Telehealth: Payer: Self-pay | Admitting: Family Medicine

## 2016-04-15 NOTE — Telephone Encounter (Signed)
She needs to be seen and recheck bp

## 2016-04-15 NOTE — Telephone Encounter (Signed)
Caller name: [name] Relationship to patient: [facility] OT Can be reached:[phone] Pharmacy:  Reason for call: Please call [name] (OT) for [place]. He has questions

## 2016-04-15 NOTE — Telephone Encounter (Signed)
Why didn't neuro order it ?

## 2016-04-16 ENCOUNTER — Telehealth: Payer: Self-pay | Admitting: Family Medicine

## 2016-04-16 DIAGNOSIS — M4802 Spinal stenosis, cervical region: Secondary | ICD-10-CM

## 2016-04-16 NOTE — Telephone Encounter (Signed)
? 

## 2016-04-16 NOTE — Telephone Encounter (Signed)
Message left to call the office.    KP 

## 2016-04-16 NOTE — Telephone Encounter (Signed)
[name] said the patient is doing well and meeting her goals, he needs a script for the power chair but we can give it to her while she is here in the office on Friday    KP

## 2016-04-16 NOTE — Telephone Encounter (Signed)
He gave her a script and advised to have PCP order it.

## 2016-04-16 NOTE — Telephone Encounter (Signed)
Pt's son dropped off document for PCP to see at front desk. Documents given to [employer] (3 pages)-[facility]

## 2016-04-16 NOTE — Telephone Encounter (Signed)
Patient is scheduled for Friday for a BP check.     KP

## 2016-04-17 DIAGNOSIS — Z471 Aftercare following joint replacement surgery: Secondary | ICD-10-CM | POA: Diagnosis not present

## 2016-04-17 DIAGNOSIS — I10 Essential (primary) hypertension: Secondary | ICD-10-CM | POA: Diagnosis not present

## 2016-04-17 DIAGNOSIS — F411 Generalized anxiety disorder: Secondary | ICD-10-CM | POA: Diagnosis not present

## 2016-04-17 DIAGNOSIS — Z96611 Presence of right artificial shoulder joint: Secondary | ICD-10-CM | POA: Diagnosis not present

## 2016-04-17 DIAGNOSIS — S46001D Unspecified injury of muscle(s) and tendon(s) of the rotator cuff of right shoulder, subsequent encounter: Secondary | ICD-10-CM | POA: Diagnosis not present

## 2016-04-17 DIAGNOSIS — M1990 Unspecified osteoarthritis, unspecified site: Secondary | ICD-10-CM | POA: Diagnosis not present

## 2016-04-17 NOTE — Telephone Encounter (Signed)
Order has been placed.     KP 

## 2016-04-19 ENCOUNTER — Ambulatory Visit: Payer: Medicare Other | Admitting: Family Medicine

## 2016-04-22 DIAGNOSIS — M1990 Unspecified osteoarthritis, unspecified site: Secondary | ICD-10-CM | POA: Diagnosis not present

## 2016-04-22 DIAGNOSIS — F411 Generalized anxiety disorder: Secondary | ICD-10-CM | POA: Diagnosis not present

## 2016-04-22 DIAGNOSIS — Z96611 Presence of right artificial shoulder joint: Secondary | ICD-10-CM | POA: Diagnosis not present

## 2016-04-22 DIAGNOSIS — Z471 Aftercare following joint replacement surgery: Secondary | ICD-10-CM | POA: Diagnosis not present

## 2016-04-22 DIAGNOSIS — S46001D Unspecified injury of muscle(s) and tendon(s) of the rotator cuff of right shoulder, subsequent encounter: Secondary | ICD-10-CM | POA: Diagnosis not present

## 2016-04-22 DIAGNOSIS — I10 Essential (primary) hypertension: Secondary | ICD-10-CM | POA: Diagnosis not present

## 2016-04-23 ENCOUNTER — Encounter: Payer: Self-pay | Admitting: Family Medicine

## 2016-04-23 ENCOUNTER — Ambulatory Visit (INDEPENDENT_AMBULATORY_CARE_PROVIDER_SITE_OTHER): Payer: Medicare Other | Admitting: Family Medicine

## 2016-04-23 VITALS — BP 169/80 | HR 89 | Temp 98.2°F | Ht 65.0 in

## 2016-04-23 DIAGNOSIS — Z23 Encounter for immunization: Secondary | ICD-10-CM

## 2016-04-23 DIAGNOSIS — G609 Hereditary and idiopathic neuropathy, unspecified: Secondary | ICD-10-CM

## 2016-04-23 DIAGNOSIS — I10 Essential (primary) hypertension: Secondary | ICD-10-CM | POA: Diagnosis not present

## 2016-04-23 DIAGNOSIS — M19112 Post-traumatic osteoarthritis, left shoulder: Secondary | ICD-10-CM

## 2016-04-23 DIAGNOSIS — M48062 Spinal stenosis, lumbar region with neurogenic claudication: Secondary | ICD-10-CM

## 2016-04-23 DIAGNOSIS — D509 Iron deficiency anemia, unspecified: Secondary | ICD-10-CM

## 2016-04-23 DIAGNOSIS — M19111 Post-traumatic osteoarthritis, right shoulder: Secondary | ICD-10-CM | POA: Insufficient documentation

## 2016-04-23 DIAGNOSIS — S46001S Unspecified injury of muscle(s) and tendon(s) of the rotator cuff of right shoulder, sequela: Secondary | ICD-10-CM

## 2016-04-23 DIAGNOSIS — E785 Hyperlipidemia, unspecified: Secondary | ICD-10-CM

## 2016-04-23 DIAGNOSIS — S46002S Unspecified injury of muscle(s) and tendon(s) of the rotator cuff of left shoulder, sequela: Secondary | ICD-10-CM

## 2016-04-23 HISTORY — DX: Post-traumatic osteoarthritis, right shoulder: M19.111

## 2016-04-23 HISTORY — DX: Unspecified injury of muscle(s) and tendon(s) of the rotator cuff of left shoulder, sequela: S46.002S

## 2016-04-23 HISTORY — DX: Unspecified injury of muscle(s) and tendon(s) of the rotator cuff of right shoulder, sequela: S46.001S

## 2016-04-23 LAB — CBC WITH DIFFERENTIAL/PLATELET
BASOS PCT: 0.3 % (ref 0.0–3.0)
Basophils Absolute: 0 10*3/uL (ref 0.0–0.1)
EOS ABS: 0 10*3/uL (ref 0.0–0.7)
EOS PCT: 0.4 % (ref 0.0–5.0)
HEMATOCRIT: 37.8 % (ref 36.0–46.0)
HEMOGLOBIN: 12.4 g/dL (ref 12.0–15.0)
LYMPHS PCT: 10.3 % — AB (ref 12.0–46.0)
Lymphs Abs: 1.1 10*3/uL (ref 0.7–4.0)
MCHC: 32.8 g/dL (ref 30.0–36.0)
MCV: 79.9 fl (ref 78.0–100.0)
MONO ABS: 0.5 10*3/uL (ref 0.1–1.0)
Monocytes Relative: 5.2 % (ref 3.0–12.0)
Neutro Abs: 8.7 10*3/uL — ABNORMAL HIGH (ref 1.4–7.7)
Neutrophils Relative %: 83.8 % — ABNORMAL HIGH (ref 43.0–77.0)
Platelets: 260 10*3/uL (ref 150.0–400.0)
RBC: 4.73 Mil/uL (ref 3.87–5.11)
RDW: 15.6 % — ABNORMAL HIGH (ref 11.5–15.5)
WBC: 10.4 10*3/uL (ref 4.0–10.5)

## 2016-04-23 LAB — COMPREHENSIVE METABOLIC PANEL
ALT: 20 U/L (ref 0–35)
AST: 29 U/L (ref 0–37)
Albumin: 4 g/dL (ref 3.5–5.2)
Alkaline Phosphatase: 95 U/L (ref 39–117)
BUN: 19 mg/dL (ref 6–23)
CALCIUM: 10.1 mg/dL (ref 8.4–10.5)
CHLORIDE: 103 meq/L (ref 96–112)
CO2: 29 meq/L (ref 19–32)
Creatinine, Ser: 0.51 mg/dL (ref 0.40–1.20)
GFR: 146.91 mL/min (ref >60.00)
Glucose, Bld: 70 mg/dL (ref 70–99)
POTASSIUM: 4.4 meq/L (ref 3.5–5.1)
Sodium: 140 mEq/L (ref 135–145)
Total Bilirubin: 0.4 mg/dL (ref 0.2–1.2)
Total Protein: 7.1 g/dL (ref 6.0–8.3)

## 2016-04-23 LAB — LIPID PANEL
CHOL/HDL RATIO: 3
CHOLESTEROL: 178 mg/dL (ref 0–200)
HDL: 66.2 mg/dL (ref >39.00)
LDL CALC: 95 mg/dL (ref 0–99)
NonHDL: 111.83
TRIGLYCERIDES: 82 mg/dL (ref 0.0–149.0)
VLDL: 16.4 mg/dL (ref 0.0–40.0)

## 2016-04-23 MED ORDER — LOSARTAN POTASSIUM-HCTZ 100-12.5 MG PO TABS: 1.0000 | ORAL_TABLET | Freq: Every day | ORAL | 1 refills | Status: DC

## 2016-04-23 MED ORDER — NONFORMULARY OR COMPOUNDED ITEM: 0 refills | Status: DC

## 2016-04-23 NOTE — Patient Instructions (Signed)
Use debrox or colace in the ears to help soften the wax And return to office if needed in 3-7 days for irrigation if needed

## 2016-04-23 NOTE — Progress Notes (Signed)
Patient ID: [place], female    DOB: 1928-02-24  Age: 82 y.o. MRN: [id number]    Subjective:  Subjective  HPI Katherine Owen presents for power wheelchair evaluation.  Pt has severe upper extremity and low ext weakness and is unable to use a manual wheelchair ---  She lives by her self and is unable to stand at counter or walk with cane or walker -- pt is not able to [employer] manual wheelchair in house.     Review of Systems  Constitutional: Positive for activity change. Negative for appetite change, fatigue and unexpected weight change.  Respiratory: Negative for cough and shortness of breath.   Cardiovascular: Negative for chest pain and palpitations.  Musculoskeletal: Positive for back pain, gait problem, myalgias, neck pain and neck stiffness.  Neurological: Positive for weakness and numbness.  Psychiatric/Behavioral: Negative for behavioral problems and dysphoric mood. The patient is not nervous/anxious.     History Past Medical History:  Diagnosis Date  . Allergy   . Arthritis   . Cataract    Bilateral  . Headache   . History of shingles   . Hyperlipidemia   . Hypertension     She has a past surgical history that includes Ankle surgery (Left, 2014); Colonoscopy; Spine surgery; Back surgery (2015); Total hip arthroplasty (Bilateral, 2005, 2009); Bunionectomy (Right); Eye surgery; Shoulder surgery (01/11/2016); and Reverse shoulder arthroplasty (Right, 01/11/2016).   Her family history includes Arthritis in her father and mother; Cancer in her mother; Parkinson's disease in her father.She reports that she has quit smoking. Her smoking use included Cigarettes. She has a 20.00 pack-year smoking history. She has never used smokeless tobacco. She reports that she does not drink alcohol or use drugs.  Current Outpatient Prescriptions on File Prior to Visit  Medication Sig Dispense Refill  . Aspirin-Acetaminophen-Caffeine (EXCEDRIN EXTRA STRENGTH PO) Take 1 tablet by mouth 2  (two) times daily as needed (pain).     Marland Kitchen atorvastatin (LIPITOR) 10 MG tablet Take 1 tablet (10 mg total) by mouth at bedtime. For high cholesterol 90 tablet 1  . celecoxib (CELEBREX) 200 MG capsule Take 1 capsule (200 mg total) by mouth daily. 100 capsule 0  . cetirizine (ZYRTEC ALLERGY) 10 MG tablet Take 1 tablet (10 mg total) by mouth at bedtime.    . Misc. Devices [facility]) [place] wheel Chair. Use as directed 1 each 0  . montelukast (SINGULAIR) 10 MG tablet Take 1 tablet (10 mg total) by mouth at bedtime. 90 tablet 3  . Multiple Vitamins-Minerals (CENTRUM SILVER) tablet Take 1 tablet by mouth daily.    . ondansetron (ZOFRAN) 4 MG tablet Take 1 tablet (4 mg total) by mouth every 8 (eight) hours as needed for nausea or vomiting. 20 tablet 0  . senna-docusate (SENOKOT-S) 8.6-50 MG per tablet Take 2 tablets by mouth 2 (two) times daily. For constipation     . traMADol (ULTRAM) 50 MG tablet Take 1 tablet (50 mg total) by mouth 3 (three) times daily. 90 tablet 5   No current facility-administered medications on file prior to visit.      Objective:  Objective  Physical Exam  Constitutional: She is oriented to person, place, and time. She appears well-developed and well-nourished.  HENT:  Head: Normocephalic and atraumatic.  Eyes: Conjunctivae and EOM are normal.  Neck: Normal range of motion. Neck supple. No JVD present. Carotid bruit is not present. No thyromegaly present.  Cardiovascular: Normal rate, regular rhythm and normal heart sounds.   No murmur  heard. Pulmonary/Chest: Effort normal and breath sounds normal. No respiratory distress. She has no wheezes. She has no rales. She exhibits no tenderness.  Musculoskeletal: She exhibits tenderness. She exhibits no edema.       Right shoulder: She exhibits decreased range of motion and tenderness.       Left shoulder: She exhibits decreased range of motion and tenderness.       Arms: Neurological: She is alert and oriented to person,  place, and time.  Psychiatric: She has a normal mood and affect.  Nursing note and vitals reviewed.  BP (!) 169/80 (BP Location: Left Arm, Patient Position: Sitting, Cuff Size: Normal)   Pulse 89   Temp 98.2 F (36.8 C) (Oral)   Ht 5\' 5"  (1.651 m)   SpO2 97%  Wt Readings from Last 3 Encounters:  03/13/16 142 lb 3.2 oz (64.5 kg)  03/06/16 142 lb 3.2 oz (64.5 kg)  02/09/16 142 lb 3.2 oz (64.5 kg)     Lab Results  Component Value Date   WBC 10.4 04/23/2016   HGB 12.4 04/23/2016   HCT 37.8 04/23/2016   PLT 260.0 04/23/2016   GLUCOSE 70 04/23/2016   CHOL 178 04/23/2016   TRIG 82.0 04/23/2016   HDL 66.20 04/23/2016   LDLCALC 95 04/23/2016   ALT 20 04/23/2016   AST 29 04/23/2016   NA 140 04/23/2016   K 4.4 04/23/2016   CL 103 04/23/2016   CREATININE 0.51 04/23/2016   BUN 19 04/23/2016   CO2 29 04/23/2016   TSH 1.31 10/27/2014    No results found.   Assessment & Plan:  Plan  I have discontinued Katherine Owen's oxyCODONE-acetaminophen, diazepam, and polyethylene glycol. I am also having her start on losartan-hydrochlorothiazide and NONFORMULARY OR COMPOUNDED ITEM. Additionally, I am having her maintain her senna-docusate, CENTRUM SILVER, Aspirin-Acetaminophen-Caffeine (EXCEDRIN EXTRA STRENGTH PO), montelukast, cetirizine, atorvastatin, ondansetron, traMADol, celecoxib, and Wheelchair.  Meds ordered this encounter  Medications  . losartan-hydrochlorothiazide (HYZAAR) 100-12.5 MG tablet    Sig: Take 1 tablet by mouth daily.    Dispense:  90 tablet    Refill:  1  . NONFORMULARY OR COMPOUNDED ITEM    Sig: Power wheelchair  Dx spinal stenosis,  Neuropathy, poor mobility    Dispense:  1 each    Refill:  0    Problem List Items Addressed This Visit      Unprioritized   HLD (hyperlipidemia)   Relevant Medications   losartan-hydrochlorothiazide (HYZAAR) 100-12.5 MG tablet   Other Relevant Orders   Lipid panel (Completed)   Comprehensive metabolic panel (Completed)    Essential hypertension   Relevant Medications   losartan-hydrochlorothiazide (HYZAAR) 100-12.5 MG tablet   NONFORMULARY OR COMPOUNDED ITEM   Other Relevant Orders   Ambulatory referral to [place] metabolic panel (Completed)   Spinal stenosis of lumbar region - Primary   Relevant Medications   NONFORMULARY OR COMPOUNDED ITEM   Other Relevant Orders   Ambulatory referral to [place] of both shoulders due to rotator cuff injury    Other Visit Diagnoses    Hereditary and idiopathic peripheral neuropathy       Relevant Medications   NONFORMULARY OR COMPOUNDED ITEM   Other Relevant Orders   Ambulatory referral to Home Health   Iron deficiency anemia, unspecified iron deficiency anemia type       Relevant Orders   CBC with Differential/Platelet (Completed)   Encounter for immunization       Relevant  Orders   Flu vaccine HIGH DOSE PF (Completed)      Follow-up: Return in about 3 months (around 07/24/2016) for hypertension, hyperlipidemia.  Ann Held, DO

## 2016-04-23 NOTE — Progress Notes (Signed)
Pre visit review using our clinic review tool, if applicable. No additional management support is needed unless otherwise documented below in the visit note. 

## 2016-04-24 DIAGNOSIS — M1990 Unspecified osteoarthritis, unspecified site: Secondary | ICD-10-CM | POA: Diagnosis not present

## 2016-04-24 DIAGNOSIS — F411 Generalized anxiety disorder: Secondary | ICD-10-CM | POA: Diagnosis not present

## 2016-04-24 DIAGNOSIS — I10 Essential (primary) hypertension: Secondary | ICD-10-CM | POA: Diagnosis not present

## 2016-04-24 DIAGNOSIS — S46001D Unspecified injury of muscle(s) and tendon(s) of the rotator cuff of right shoulder, subsequent encounter: Secondary | ICD-10-CM | POA: Diagnosis not present

## 2016-04-24 DIAGNOSIS — Z471 Aftercare following joint replacement surgery: Secondary | ICD-10-CM | POA: Diagnosis not present

## 2016-04-24 DIAGNOSIS — Z96611 Presence of right artificial shoulder joint: Secondary | ICD-10-CM | POA: Diagnosis not present

## 2016-04-26 DIAGNOSIS — M1990 Unspecified osteoarthritis, unspecified site: Secondary | ICD-10-CM | POA: Diagnosis not present

## 2016-04-26 DIAGNOSIS — I10 Essential (primary) hypertension: Secondary | ICD-10-CM | POA: Diagnosis not present

## 2016-04-26 DIAGNOSIS — S46001D Unspecified injury of muscle(s) and tendon(s) of the rotator cuff of right shoulder, subsequent encounter: Secondary | ICD-10-CM | POA: Diagnosis not present

## 2016-04-26 DIAGNOSIS — Z471 Aftercare following joint replacement surgery: Secondary | ICD-10-CM | POA: Diagnosis not present

## 2016-04-26 DIAGNOSIS — Z96611 Presence of right artificial shoulder joint: Secondary | ICD-10-CM | POA: Diagnosis not present

## 2016-04-26 DIAGNOSIS — F411 Generalized anxiety disorder: Secondary | ICD-10-CM | POA: Diagnosis not present

## 2016-04-27 ENCOUNTER — Ambulatory Visit (HOSPITAL_BASED_OUTPATIENT_CLINIC_OR_DEPARTMENT_OTHER)
Admission: RE | Admit: 2016-04-27 | Discharge: 2016-04-27 | Disposition: A | Discharge status: Home / Self Care | Payer: Medicare Other | Source: Ambulatory Visit | Attending: Family Medicine | Admitting: Family Medicine

## 2016-04-27 DIAGNOSIS — M50222 Other cervical disc displacement at C5-C6 level: Secondary | ICD-10-CM | POA: Diagnosis not present

## 2016-04-27 DIAGNOSIS — M5001 Cervical disc disorder with myelopathy,  high cervical region: Secondary | ICD-10-CM | POA: Diagnosis not present

## 2016-04-27 DIAGNOSIS — M50021 Cervical disc disorder at C4-C5 level with myelopathy: Secondary | ICD-10-CM | POA: Diagnosis not present

## 2016-04-27 DIAGNOSIS — M5002 Cervical disc disorder with myelopathy, mid-cervical region, unspecified level: Secondary | ICD-10-CM | POA: Diagnosis not present

## 2016-04-27 DIAGNOSIS — I6782 Cerebral ischemia: Secondary | ICD-10-CM | POA: Diagnosis not present

## 2016-04-27 DIAGNOSIS — M4802 Spinal stenosis, cervical region: Secondary | ICD-10-CM | POA: Insufficient documentation

## 2016-04-27 DIAGNOSIS — M5104 Intervertebral disc disorders with myelopathy, thoracic region: Secondary | ICD-10-CM

## 2016-04-27 DIAGNOSIS — R202 Paresthesia of skin: Secondary | ICD-10-CM | POA: Diagnosis not present

## 2016-04-27 DIAGNOSIS — M4804 Spinal stenosis, thoracic region: Secondary | ICD-10-CM | POA: Diagnosis not present

## 2016-04-27 DIAGNOSIS — M50022 Cervical disc disorder at C5-C6 level with myelopathy: Secondary | ICD-10-CM | POA: Diagnosis not present

## 2016-04-27 MED ORDER — GADOBENATE DIMEGLUMINE 529 MG/ML IV SOLN: 10.0000 mL | Freq: Once | INTRAVENOUS | Status: DC | PRN

## 2016-04-29 DIAGNOSIS — M1990 Unspecified osteoarthritis, unspecified site: Secondary | ICD-10-CM | POA: Diagnosis not present

## 2016-04-29 DIAGNOSIS — Z471 Aftercare following joint replacement surgery: Secondary | ICD-10-CM | POA: Diagnosis not present

## 2016-04-29 DIAGNOSIS — F411 Generalized anxiety disorder: Secondary | ICD-10-CM | POA: Diagnosis not present

## 2016-04-29 DIAGNOSIS — S46001D Unspecified injury of muscle(s) and tendon(s) of the rotator cuff of right shoulder, subsequent encounter: Secondary | ICD-10-CM | POA: Diagnosis not present

## 2016-04-29 DIAGNOSIS — I10 Essential (primary) hypertension: Secondary | ICD-10-CM | POA: Diagnosis not present

## 2016-04-29 DIAGNOSIS — Z96611 Presence of right artificial shoulder joint: Secondary | ICD-10-CM | POA: Diagnosis not present

## 2016-04-29 NOTE — Telephone Encounter (Signed)
I have sent referral to Neurosurgery to Dr. Manson Passey per patient request. I will fax MRI report to [facility] [id number]. TL/CMA

## 2016-04-29 NOTE — Telephone Encounter (Signed)
-----   Message from Ann Held, [place] sent at 04/29/2016  1:09 PM EST ----- Spinal stenosis ---  Refer neurosurgery

## 2016-04-30 DIAGNOSIS — F411 Generalized anxiety disorder: Secondary | ICD-10-CM | POA: Diagnosis not present

## 2016-04-30 DIAGNOSIS — Z96611 Presence of right artificial shoulder joint: Secondary | ICD-10-CM | POA: Diagnosis not present

## 2016-04-30 DIAGNOSIS — I10 Essential (primary) hypertension: Secondary | ICD-10-CM | POA: Diagnosis not present

## 2016-04-30 DIAGNOSIS — M1990 Unspecified osteoarthritis, unspecified site: Secondary | ICD-10-CM | POA: Diagnosis not present

## 2016-04-30 DIAGNOSIS — Z471 Aftercare following joint replacement surgery: Secondary | ICD-10-CM | POA: Diagnosis not present

## 2016-04-30 DIAGNOSIS — S46001D Unspecified injury of muscle(s) and tendon(s) of the rotator cuff of right shoulder, subsequent encounter: Secondary | ICD-10-CM | POA: Diagnosis not present

## 2016-05-02 DIAGNOSIS — M1990 Unspecified osteoarthritis, unspecified site: Secondary | ICD-10-CM | POA: Diagnosis not present

## 2016-05-02 DIAGNOSIS — Z471 Aftercare following joint replacement surgery: Secondary | ICD-10-CM | POA: Diagnosis not present

## 2016-05-02 DIAGNOSIS — F411 Generalized anxiety disorder: Secondary | ICD-10-CM | POA: Diagnosis not present

## 2016-05-02 DIAGNOSIS — I10 Essential (primary) hypertension: Secondary | ICD-10-CM | POA: Diagnosis not present

## 2016-05-02 DIAGNOSIS — S46001D Unspecified injury of muscle(s) and tendon(s) of the rotator cuff of right shoulder, subsequent encounter: Secondary | ICD-10-CM | POA: Diagnosis not present

## 2016-05-02 DIAGNOSIS — Z96611 Presence of right artificial shoulder joint: Secondary | ICD-10-CM | POA: Diagnosis not present

## 2016-05-03 DIAGNOSIS — Z96611 Presence of right artificial shoulder joint: Secondary | ICD-10-CM | POA: Diagnosis not present

## 2016-05-03 DIAGNOSIS — I10 Essential (primary) hypertension: Secondary | ICD-10-CM | POA: Diagnosis not present

## 2016-05-03 DIAGNOSIS — M1990 Unspecified osteoarthritis, unspecified site: Secondary | ICD-10-CM | POA: Diagnosis not present

## 2016-05-03 DIAGNOSIS — S46001D Unspecified injury of muscle(s) and tendon(s) of the rotator cuff of right shoulder, subsequent encounter: Secondary | ICD-10-CM | POA: Diagnosis not present

## 2016-05-03 DIAGNOSIS — F411 Generalized anxiety disorder: Secondary | ICD-10-CM | POA: Diagnosis not present

## 2016-05-03 DIAGNOSIS — Z471 Aftercare following joint replacement surgery: Secondary | ICD-10-CM | POA: Diagnosis not present

## 2016-05-06 DIAGNOSIS — F411 Generalized anxiety disorder: Secondary | ICD-10-CM | POA: Diagnosis not present

## 2016-05-06 DIAGNOSIS — M1990 Unspecified osteoarthritis, unspecified site: Secondary | ICD-10-CM | POA: Diagnosis not present

## 2016-05-06 DIAGNOSIS — I10 Essential (primary) hypertension: Secondary | ICD-10-CM | POA: Diagnosis not present

## 2016-05-06 DIAGNOSIS — S46001D Unspecified injury of muscle(s) and tendon(s) of the rotator cuff of right shoulder, subsequent encounter: Secondary | ICD-10-CM | POA: Diagnosis not present

## 2016-05-06 DIAGNOSIS — Z96611 Presence of right artificial shoulder joint: Secondary | ICD-10-CM | POA: Diagnosis not present

## 2016-05-06 DIAGNOSIS — Z471 Aftercare following joint replacement surgery: Secondary | ICD-10-CM | POA: Diagnosis not present

## 2016-05-08 DIAGNOSIS — M1990 Unspecified osteoarthritis, unspecified site: Secondary | ICD-10-CM | POA: Diagnosis not present

## 2016-05-08 DIAGNOSIS — Z96611 Presence of right artificial shoulder joint: Secondary | ICD-10-CM | POA: Diagnosis not present

## 2016-05-08 DIAGNOSIS — S46001D Unspecified injury of muscle(s) and tendon(s) of the rotator cuff of right shoulder, subsequent encounter: Secondary | ICD-10-CM | POA: Diagnosis not present

## 2016-05-08 DIAGNOSIS — I10 Essential (primary) hypertension: Secondary | ICD-10-CM | POA: Diagnosis not present

## 2016-05-08 DIAGNOSIS — F411 Generalized anxiety disorder: Secondary | ICD-10-CM | POA: Diagnosis not present

## 2016-05-08 DIAGNOSIS — Z471 Aftercare following joint replacement surgery: Secondary | ICD-10-CM | POA: Diagnosis not present

## 2016-05-13 ENCOUNTER — Telehealth: Payer: Self-pay | Admitting: Family Medicine

## 2016-05-13 DIAGNOSIS — Z96611 Presence of right artificial shoulder joint: Secondary | ICD-10-CM | POA: Diagnosis not present

## 2016-05-13 DIAGNOSIS — M1990 Unspecified osteoarthritis, unspecified site: Secondary | ICD-10-CM | POA: Diagnosis not present

## 2016-05-13 DIAGNOSIS — Z471 Aftercare following joint replacement surgery: Secondary | ICD-10-CM | POA: Diagnosis not present

## 2016-05-13 DIAGNOSIS — I10 Essential (primary) hypertension: Secondary | ICD-10-CM | POA: Diagnosis not present

## 2016-05-13 DIAGNOSIS — F411 Generalized anxiety disorder: Secondary | ICD-10-CM | POA: Diagnosis not present

## 2016-05-13 DIAGNOSIS — S46001D Unspecified injury of muscle(s) and tendon(s) of the rotator cuff of right shoulder, subsequent encounter: Secondary | ICD-10-CM | POA: Diagnosis not present

## 2016-05-13 NOTE — Telephone Encounter (Signed)
Patient called to schedule an appointment for a face to  face power wheelchair evaluation. I have her scheduled for 05/21/16 at 4:00pm. I made it a 2min appt. Please advise if it needs to be rescheduled.

## 2016-05-14 NOTE — Telephone Encounter (Incomplete)
Forward message to provider, awaiting a

## 2016-05-16 DIAGNOSIS — S46001D Unspecified injury of muscle(s) and tendon(s) of the rotator cuff of right shoulder, subsequent encounter: Secondary | ICD-10-CM | POA: Diagnosis not present

## 2016-05-16 DIAGNOSIS — M1990 Unspecified osteoarthritis, unspecified site: Secondary | ICD-10-CM | POA: Diagnosis not present

## 2016-05-16 DIAGNOSIS — Z471 Aftercare following joint replacement surgery: Secondary | ICD-10-CM | POA: Diagnosis not present

## 2016-05-16 DIAGNOSIS — Z96642 Presence of left artificial hip joint: Secondary | ICD-10-CM | POA: Diagnosis not present

## 2016-05-16 DIAGNOSIS — Z96611 Presence of right artificial shoulder joint: Secondary | ICD-10-CM | POA: Diagnosis not present

## 2016-05-16 DIAGNOSIS — F411 Generalized anxiety disorder: Secondary | ICD-10-CM | POA: Diagnosis not present

## 2016-05-16 DIAGNOSIS — I10 Essential (primary) hypertension: Secondary | ICD-10-CM | POA: Diagnosis not present

## 2016-05-16 DIAGNOSIS — E785 Hyperlipidemia, unspecified: Secondary | ICD-10-CM | POA: Diagnosis not present

## 2016-05-16 DIAGNOSIS — Z96641 Presence of right artificial hip joint: Secondary | ICD-10-CM | POA: Diagnosis not present

## 2016-05-20 DIAGNOSIS — Z96611 Presence of right artificial shoulder joint: Secondary | ICD-10-CM | POA: Diagnosis not present

## 2016-05-20 DIAGNOSIS — Z471 Aftercare following joint replacement surgery: Secondary | ICD-10-CM | POA: Diagnosis not present

## 2016-05-20 DIAGNOSIS — S46001D Unspecified injury of muscle(s) and tendon(s) of the rotator cuff of right shoulder, subsequent encounter: Secondary | ICD-10-CM | POA: Diagnosis not present

## 2016-05-20 DIAGNOSIS — M1990 Unspecified osteoarthritis, unspecified site: Secondary | ICD-10-CM | POA: Diagnosis not present

## 2016-05-20 DIAGNOSIS — F411 Generalized anxiety disorder: Secondary | ICD-10-CM | POA: Diagnosis not present

## 2016-05-20 DIAGNOSIS — I10 Essential (primary) hypertension: Secondary | ICD-10-CM | POA: Diagnosis not present

## 2016-05-21 ENCOUNTER — Encounter: Payer: Self-pay | Admitting: Family Medicine

## 2016-05-21 ENCOUNTER — Ambulatory Visit (INDEPENDENT_AMBULATORY_CARE_PROVIDER_SITE_OTHER): Payer: Medicare Other | Admitting: Family Medicine

## 2016-05-21 VITALS — BP 138/82 | HR 77 | Temp 98.5°F | Resp 16 | Ht 65.0 in | Wt 142.0 lb

## 2016-05-21 DIAGNOSIS — M19112 Post-traumatic osteoarthritis, left shoulder: Secondary | ICD-10-CM | POA: Diagnosis not present

## 2016-05-21 DIAGNOSIS — M48061 Spinal stenosis, lumbar region without neurogenic claudication: Secondary | ICD-10-CM

## 2016-05-21 DIAGNOSIS — S46001S Unspecified injury of muscle(s) and tendon(s) of the rotator cuff of right shoulder, sequela: Secondary | ICD-10-CM

## 2016-05-21 DIAGNOSIS — S46002S Unspecified injury of muscle(s) and tendon(s) of the rotator cuff of left shoulder, sequela: Secondary | ICD-10-CM

## 2016-05-21 DIAGNOSIS — I10 Essential (primary) hypertension: Secondary | ICD-10-CM

## 2016-05-21 DIAGNOSIS — M19111 Post-traumatic osteoarthritis, right shoulder: Secondary | ICD-10-CM

## 2016-05-21 NOTE — Progress Notes (Signed)
Patient ID: Teneya Patty, female    DOB: 1927-07-13  Age: 82 y.o. MRN: [id number]    Subjective:  Subjective  HPI Aleece Zacarias presents for power wheel chair evaluation.  Pt lives by herself  She is able to use the joy stick on the power wheel chair -- pt is unable to use manual wheelchair due to upper ext weakness.  Pt would need to be able to get to the kitchen to get a meal or something to drink.  Family makes her meals and leaves them for her.  They also help with bathing and helps her with the bath room   Pt unable to use cane or walker because of upper and lower ext weakness.  She also can not use scooter due to not being able to transfer on or off of it and would not be able to use handle bar to [employer].  Pt has the physical and mental ability to use the power wheelchair and has shown this ability to the home health aid.   Review of Systems  Constitutional: Negative for appetite change, diaphoresis, fatigue and unexpected weight change.  Eyes: Negative for pain, redness and visual disturbance.  Respiratory: Negative for cough, chest tightness, shortness of breath and wheezing.   Cardiovascular: Negative for chest pain, palpitations and leg swelling.  Endocrine: Negative for cold intolerance, heat intolerance, polydipsia, polyphagia and polyuria.  Genitourinary: Negative for difficulty urinating, dysuria and frequency.  Neurological: Positive for weakness. Negative for dizziness, light-headedness, numbness and headaches.    History Past Medical History:  Diagnosis Date  . Allergy   . Arthritis   . Cataract    Bilateral  . Essential hypertension 05/02/2014  . Headache   . History of shingles   . HLD (hyperlipidemia) 05/02/2014  . Hyperlipidemia   . Hypertension   . Neuropathic pain 05/02/2014  . Osteoarthritis of both shoulders due to rotator cuff injury 04/23/2016  . Poor mobility 08/28/2015  . 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 Spinal stenosis of lumbar  region 05/02/2014   S/p decompression at [facility] November 2015     She has a past surgical history that includes Ankle surgery (Left, 2014); Colonoscopy; Spine surgery; Back surgery (2015); Total hip arthroplasty (Bilateral, 2005, 2009); Bunionectomy (Right); Eye surgery; Shoulder surgery (01/11/2016); and Reverse shoulder arthroplasty (Right, 01/11/2016).   Her family history includes Arthritis in her father and mother; Cancer in her mother; Parkinson's disease in her father.She reports that she has quit smoking. Her smoking use included Cigarettes. She has a 20.00 pack-year smoking history. She has never used smokeless tobacco. She reports that she does not drink alcohol or use drugs.  Current Outpatient Prescriptions on File Prior to Visit  Medication Sig Dispense Refill  . Aspirin-Acetaminophen-Caffeine (EXCEDRIN EXTRA STRENGTH PO) Take 1 tablet by mouth 2 (two) times daily as needed (pain).     Marland Kitchen atorvastatin (LIPITOR) 10 MG tablet Take 1 tablet (10 mg total) by mouth at bedtime. For high cholesterol 90 tablet 1  . celecoxib (CELEBREX) 200 MG capsule Take 1 capsule (200 mg total) by mouth daily. 100 capsule 0  . cetirizine (ZYRTEC ALLERGY) 10 MG tablet Take 1 tablet (10 mg total) by mouth at bedtime.    Marland Kitchen losartan-hydrochlorothiazide (HYZAAR) 100-12.5 MG tablet Take 1 tablet by mouth daily. 90 tablet 1  . Misc. Devices [facility]) [place] wheel Chair. Use as directed 1 each 0  . montelukast (SINGULAIR) 10 MG tablet Take 1 tablet (10 mg total) by mouth at  bedtime. 90 tablet 3  . Multiple Vitamins-Minerals (CENTRUM SILVER) tablet Take 1 tablet by mouth daily.    . NONFORMULARY OR COMPOUNDED ITEM Power wheelchair  Dx spinal stenosis,  Neuropathy, poor mobility 1 each 0  . senna-docusate (SENOKOT-S) 8.6-50 MG per tablet Take 2 tablets by mouth 2 (two) times daily. For constipation     . traMADol (ULTRAM) 50 MG tablet Take 1 tablet (50 mg total) by mouth 3 (three) times daily. 90 tablet 5  .  ondansetron (ZOFRAN) 4 MG tablet Take 1 tablet (4 mg total) by mouth every 8 (eight) hours as needed for nausea or vomiting. (Patient not taking: Reported on 05/21/2016) 20 tablet 0   No current facility-administered medications on file prior to visit.      Objective:  Objective  Physical Exam  Constitutional: She is oriented to person, place, and time. She appears well-developed and well-nourished.  HENT:  Head: Normocephalic and atraumatic.  Eyes: Conjunctivae and EOM are normal.  Neck: Normal range of motion. Neck supple. No JVD present. Carotid bruit is not present. No thyromegaly present.  Cardiovascular: Normal rate, regular rhythm and normal heart sounds.   No murmur heard. Pulmonary/Chest: Effort normal and breath sounds normal. No respiratory distress. She has no wheezes. She has no rales. She exhibits no tenderness.  Musculoskeletal: She exhibits tenderness. She exhibits no edema.  Neurological: She is alert and oriented to person, place, and time.  4/5 upper ext strength with 10/10 pain with movement  1-2 / 5 strength in low exr   Psychiatric: She has a normal mood and affect. Her behavior is normal. Judgment and thought content normal.  Nursing note and vitals reviewed.  BP 138/82 (BP Location: Left Arm, Patient Position: Sitting, Cuff Size: Normal)   Pulse 77   Temp 98.5 F (36.9 C) (Oral)   Resp 16   Ht 5\' 5"  (1.651 m)   Wt 142 lb (64.4 kg)   SpO2 91%   BMI 23.63 kg/m  Wt Readings from Last 3 Encounters:  05/21/16 142 lb (64.4 kg)  03/13/16 142 lb 3.2 oz (64.5 kg)  03/06/16 142 lb 3.2 oz (64.5 kg)     Lab Results  Component Value Date   WBC 10.4 04/23/2016   HGB 12.4 04/23/2016   HCT 37.8 04/23/2016   PLT 260.0 04/23/2016   GLUCOSE 70 04/23/2016   CHOL 178 04/23/2016   TRIG 82.0 04/23/2016   HDL 66.20 04/23/2016   LDLCALC 95 04/23/2016   ALT 20 04/23/2016   AST 29 04/23/2016   NA 140 04/23/2016   K 4.4 04/23/2016   CL 103 04/23/2016   CREATININE  0.51 04/23/2016   BUN 19 04/23/2016   CO2 29 04/23/2016   TSH 1.31 10/27/2014    Mr Brain W Wo Contrast  Result Date: 04/28/2016 CLINICAL DATA:  Chronic, worsening bilateral lower extremity paresthesia is an bilateral arm pain and weakness, right worse than left. EXAM: MRI HEAD WITHOUT AND WITH CONTRAST TECHNIQUE: Multiplanar, multiecho pulse sequences of the brain and surrounding structures were obtained without and with intravenous contrast. CONTRAST:  10 mL MultiHance COMPARISON:  None. FINDINGS: Brain: There is no evidence of acute infarct, intracranial hemorrhage, mass, midline shift, or extra-axial fluid collection. The ventricles and sulci are normal for age. Subcortical and periventricular cerebral white matter T2 hyperintensities are nonspecific but compatible with minimal chronic small vessel ischemic disease. No abnormal enhancement is identified. Vascular: Major intracranial arterial flow voids are preserved. T2 signal in the left sigmoid sinus  likely reflects slow flow given normal enhancement. Skull and upper cervical spine: Unremarkable bone marrow signal. Sinuses/Orbits: Prior bilateral cataract extraction. Paranasal sinuses and mastoid air cells are clear. Other: None. IMPRESSION: 1. No acute intracranial abnormality. 2. Minimal chronic small vessel ischemic disease. Electronically Signed   By: Logan Bores M.D.   On: 04/28/2016 09:52   Mr Cervical Spine Wo Contrast  Result Date: 04/28/2016 CLINICAL DATA:  Chronic, worsening bilateral lower extremity paresthesias and bilateral arm pain and weakness, right worse than left. Cervical disc disorder with myelopathy. EXAM: MRI CERVICAL SPINE WITHOUT CONTRAST TECHNIQUE: Multiplanar, multisequence MR imaging of the cervical spine was performed. No intravenous contrast was administered. COMPARISON:  None. FINDINGS: Alignment: Mild reversal of the normal lordosis in the lower cervical spine. Grade 1 anterolisthesis of C4 on C5 and C7 on T1.  Vertebrae: Preserved vertebral body heights without evidence of fracture or osseous lesion. Mild type 1 degenerative endplate changes at QA348G. Cord: Normal signal. Posterior Fossa, vertebral arteries, paraspinal tissues: Unremarkable. Disc levels: Diffuse cervical disc degeneration with mild disc space narrowing at C2-3, moderate narrowing at C5-6 and C6-7, and severe narrowing at C7-T1. C2-3: Central disc protrusion without spinal stenosis. Uncovertebral spurring and moderate left facet arthrosis result in mild bilateral neural foraminal stenosis. C3-4: Central disc protrusion without spinal stenosis. Uncovertebral spurring and moderate left facet arthrosis result in mild left neural foraminal stenosis. C4-5: Disc bulging, uncovertebral spurring, and mild facet arthrosis result in mild bilateral neural foraminal stenosis without spinal stenosis. C5-6: Moderately large right central disc extrusion results in moderate spinal stenosis and mild-to-moderate right-sided cord flattening. Uncovertebral spurring and mild facet arthrosis result in severe bilateral neural foraminal stenosis. C6-7: Central disc protrusion without spinal stenosis. Uncovertebral spurring results in mild bilateral neural foraminal stenosis. C7-T1: Listhesis with disc uncovering and uncovertebral spurring result in mild bilateral neural foraminal stenosis. There is bilateral facet ankylosis. No spinal stenosis. IMPRESSION: Diffuse cervical disc degeneration most notable at C5-6 where a disc extrusion results in moderate spinal stenosis with mild to moderate cord flattening. Severe bilateral neural foraminal stenosis at this level. Electronically Signed   By: Logan Bores M.D.   On: 04/28/2016 09:47   Mr Thoracic Spine Wo Contrast  Result Date: 04/28/2016 CLINICAL DATA:  Chronic, worsening bilateral lower extremity paresthesias and bilateral arm pain and weakness, right worse than left. EXAM: MRI THORACIC SPINE WITHOUT CONTRAST TECHNIQUE:  Multiplanar, multisequence MR imaging of the thoracic spine was performed. No intravenous contrast was administered. COMPARISON:  None. FINDINGS: Alignment: Grade 1 anterolisthesis of C7 on T1. Trace anterolisthesis of T1 on T2. Vertebrae: Preserved vertebral body heights without evidence of fracture or osseous lesion. Mild type 1 degenerative endplate changes at QA348G. Prior posterior fusion at T11-12 with associated susceptibility artifact from pedicle screws. Cord:  Normal signal. Paraspinal and other soft tissues: 1.5 cm T2 hyperintense lesion in the upper pole of the left kidney, likely a cyst. Disc levels: T1-2: Disc bulging, central disc protrusion, and facet arthrosis result in moderate bilateral neural foraminal stenosis without spinal stenosis. T2-3: Disc bulging greater to the right and facet arthrosis result in moderate right neural foraminal stenosis without spinal stenosis. T3-4: Mild disc bulging greater to the right and mild facet arthrosis result in mild right neural foraminal stenosis without spinal stenosis. T4-5: Mild disc bulging and minimal facet arthrosis without stenosis. T5-6:  Minimal right facet arthrosis without stenosis. T6-7:  Negative. T7-8:  Minimal disc bulging without stenosis. T8-9: Disc bulging results in mild bilateral neural  foraminal stenosis without spinal stenosis. T9-10: Disc bulging results in moderate right and mild left neural foraminal stenosis without spinal stenosis. T10-11: Evaluation mildly limited by magnetic susceptibility artifact. Disc bulging and infolding of the ligamentum flavum result in mild spinal stenosis and moderate bilateral neural foraminal stenosis. T11-12: Prior posterior decompression and fusion. Disc bulging and central disc protrusion result in a slight impression on the ventral spinal cord without residual spinal stenosis. Possible mild-to-moderate left neural foraminal narrowing, with assessment limited by artifact. T12-L1: Disc bulging and  endplate spurring asymmetric to the left result in mild left neural foraminal stenosis without spinal stenosis. IMPRESSION: 1. Prior T11-12 fusion without residual spinal stenosis. 2. Mild multifactorial spinal stenosis and moderate bilateral neural foraminal stenosis at T10-11. 3. Diffuse thoracic disc and facet degeneration elsewhere with mild-to-moderate neural foraminal stenosis as above. Electronically Signed   By: Logan Bores M.D.   On: 04/28/2016 09:38     Assessment & Plan:  Plan  I am having Ms. Claybon Jabs maintain her senna-docusate, CENTRUM SILVER, Aspirin-Acetaminophen-Caffeine (EXCEDRIN EXTRA STRENGTH PO), montelukast, cetirizine, atorvastatin, ondansetron, traMADol, celecoxib, Wheelchair, losartan-hydrochlorothiazide, and NONFORMULARY OR COMPOUNDED ITEM.  No orders of the defined types were placed in this encounter.   Problem List Items Addressed This Visit      Unprioritized   Essential hypertension - Primary   Osteoarthritis of both shoulders due to rotator cuff injury    S/p repair Pain with repeatative motion      Spinal stenosis of lumbar region    Chronic back pain  Unable to use cane or walker because or extreme weakness in legs          Follow-up: No Follow-up on file.  Ann Held, DO

## 2016-05-21 NOTE — Progress Notes (Signed)
Pre visit review using our clinic review tool, if applicable. No additional management support is needed unless otherwise documented below in the visit note. 

## 2016-05-21 NOTE — Assessment & Plan Note (Signed)
S/p repair Pain with repeatative motion

## 2016-05-21 NOTE — Patient Instructions (Signed)
Osteoarthritis  Osteoarthritis is a type of arthritis that affects tissue that covers the ends of bones in joints (cartilage). Cartilage acts as a cushion between the bones and helps them move smoothly. Osteoarthritis results when cartilage in the joints gets worn down. Osteoarthritis is sometimes called "wear and tear" arthritis.  Osteoarthritis is the most common form of arthritis. It often occurs in older people. It is a condition that gets worse over time (a progressive condition). Joints that are most often affected by this condition are in:  · Fingers.  · Toes.  · Hips.  · Knees.  · Spine, including neck and lower back.    What are the causes?  This condition is caused by age-related wearing down of cartilage that covers the ends of bones.  What increases the risk?  The following factors may make you more likely to develop this condition:  · Older age.  · Being overweight or obese.  · Overuse of joints, such as in athletes.  · Past injury of a joint.  · Past surgery on a joint.  · Family history of osteoarthritis.    What are the signs or symptoms?  The main symptoms of this condition are pain, swelling, and stiffness in the joint. The joint may lose its shape over time. Small pieces of bone or cartilage may break off and float inside of the joint, which may cause more pain and damage to the joint. Small deposits of bone (osteophytes) may grow on the edges of the joint. Other symptoms may include:  · A grating or scraping feeling inside the joint when you move it.  · Popping or creaking sounds when you move.    Symptoms may affect one or more joints. Osteoarthritis in a major joint, such as your knee or hip, can make it painful to walk or exercise. If you have osteoarthritis in your hands, you might not be able to grip items, twist your hand, or control small movements of your hands and fingers (fine motor skills).  How is this diagnosed?  This condition may be diagnosed based on:  · Your medical history.  · A  physical exam.  · Your symptoms.  · X-rays of the affected joint(s).  · Blood tests to rule out other types of arthritis.    How is this treated?  There is no cure for this condition, but treatment can help to control pain and improve joint function. Treatment plans may include:  · A prescribed exercise program that allows for rest and joint relief. You may work with a physical therapist.  · A weight control plan.  · Pain relief techniques, such as:  ? Applying heat and cold to the joint.  ? Electric pulses delivered to nerve endings under the skin (transcutaneous electrical nerve stimulation, or TENS).  ? Massage.  ? Certain nutritional supplements.  · NSAIDs or prescription medicines to help relieve pain.  · Medicine to help relieve pain and inflammation (corticosteroids). This can be given by mouth (orally) or as an injection.  · Assistive devices, such as a brace, wrap, splint, specialized glove, or cane.  · Surgery, such as:  ? An osteotomy. This is done to reposition the bones and relieve pain or to remove loose pieces of bone and cartilage.  ? Joint replacement surgery. You may need this surgery if you have very bad (advanced) osteoarthritis.    Follow these instructions at home:  Activity   · Rest your affected joints as directed by your   health care provider.  · Do not drive or use heavy machinery while taking prescription pain medicine.  · Exercise as directed. Your health care provider or physical therapist may recommend specific types of exercise, such as:  ? Strengthening exercises. These are done to strengthen the muscles that support joints that are affected by arthritis. They can be performed with weights or with exercise bands to add resistance.  ? Aerobic activities. These are exercises, such as brisk walking or water aerobics, that get your heart pumping.  ? Range-of-motion activities. These keep your joints easy to move.  ? Balance and agility exercises.  Managing pain, stiffness, and swelling    · If directed, apply heat to the affected area as often as told by your health care provider. Use the heat source that your health care provider recommends, such as a moist heat pack or a heating pad.  ? If you have a removable assistive device, remove it as told by your health care provider.  ? Place a towel between your skin and the heat source. If your health care provider tells you to keep the assistive device on while you apply heat, place a towel between the assistive device and the heat source.  ? Leave the heat on for 20-30 minutes.  ? Remove the heat if your skin turns bright red. This is especially important if you are unable to feel pain, heat, or cold. You may have a greater risk of getting burned.  · If directed, put ice on the affected joint:  ? If you have a removable assistive device, remove it as told by your health care provider.  ? Put ice in a plastic bag.  ? Place a towel between your skin and the bag. If your health care provider tells you to keep the assistive device on during icing, place a towel between the assistive device and the bag.  ? Leave the ice on for 20 minutes, 2-3 times a day.  General instructions   · Take over-the-counter and prescription medicines only as told by your health care provider.  · Maintain a healthy weight. Follow instructions from your health care provider for weight control. These may include dietary restrictions.  · Do not use any products that contain nicotine or tobacco, such as cigarettes and e-cigarettes. These can delay bone healing. If you need help quitting, ask your health care provider.  · Use assistive devices as directed by your health care provider.  · Keep all follow-up visits as told by your health care provider. This is important.  Where to find more information:  · National Institute of Arthritis and Musculoskeletal and Skin Diseases: www.niams.nih.gov  · National Institute on Aging: www.nia.nih.gov  · American College of Rheumatology:  www.rheumatology.org  Contact a health care provider if:  · Your skin turns red.  · You develop a rash.  · You have pain that gets worse.  · You have a fever along with joint or muscle aches.  Get help right away if:  · You lose a lot of weight.  · You suddenly lose your appetite.  · You have night sweats.  Summary  · Osteoarthritis is a type of arthritis that affects tissue covering the ends of bones in joints (cartilage).  · This condition is caused by age-related wearing down of cartilage that covers the ends of bones.  · The main symptom of this condition is pain, swelling, and stiffness in the joint.  · There is no cure for this   condition, but treatment can help to control pain and improve joint function.  This information is not intended to replace advice given to you by your health care provider. Make sure you discuss any questions you have with your health care provider.  Document Released: 06/10/2005 Document Revised: 02/12/2016 Document Reviewed: 02/12/2016  Elsevier Interactive Patient Education © 2017 Elsevier Inc.

## 2016-05-21 NOTE — Assessment & Plan Note (Signed)
Chronic back pain  Unable to use cane or walker because or extreme weakness in legs

## 2016-05-22 DIAGNOSIS — S46001D Unspecified injury of muscle(s) and tendon(s) of the rotator cuff of right shoulder, subsequent encounter: Secondary | ICD-10-CM | POA: Diagnosis not present

## 2016-05-22 DIAGNOSIS — F411 Generalized anxiety disorder: Secondary | ICD-10-CM | POA: Diagnosis not present

## 2016-05-22 DIAGNOSIS — M1990 Unspecified osteoarthritis, unspecified site: Secondary | ICD-10-CM | POA: Diagnosis not present

## 2016-05-22 DIAGNOSIS — Z471 Aftercare following joint replacement surgery: Secondary | ICD-10-CM | POA: Diagnosis not present

## 2016-05-22 DIAGNOSIS — Z96611 Presence of right artificial shoulder joint: Secondary | ICD-10-CM | POA: Diagnosis not present

## 2016-05-22 DIAGNOSIS — I10 Essential (primary) hypertension: Secondary | ICD-10-CM | POA: Diagnosis not present

## 2016-05-27 DIAGNOSIS — Z96611 Presence of right artificial shoulder joint: Secondary | ICD-10-CM | POA: Diagnosis not present

## 2016-05-27 DIAGNOSIS — M1990 Unspecified osteoarthritis, unspecified site: Secondary | ICD-10-CM | POA: Diagnosis not present

## 2016-05-27 DIAGNOSIS — Z471 Aftercare following joint replacement surgery: Secondary | ICD-10-CM | POA: Diagnosis not present

## 2016-05-27 DIAGNOSIS — S46001D Unspecified injury of muscle(s) and tendon(s) of the rotator cuff of right shoulder, subsequent encounter: Secondary | ICD-10-CM | POA: Diagnosis not present

## 2016-05-27 DIAGNOSIS — F411 Generalized anxiety disorder: Secondary | ICD-10-CM | POA: Diagnosis not present

## 2016-05-27 DIAGNOSIS — I10 Essential (primary) hypertension: Secondary | ICD-10-CM | POA: Diagnosis not present

## 2016-05-29 DIAGNOSIS — I10 Essential (primary) hypertension: Secondary | ICD-10-CM | POA: Diagnosis not present

## 2016-05-29 DIAGNOSIS — Z96611 Presence of right artificial shoulder joint: Secondary | ICD-10-CM | POA: Diagnosis not present

## 2016-05-29 DIAGNOSIS — Z471 Aftercare following joint replacement surgery: Secondary | ICD-10-CM | POA: Diagnosis not present

## 2016-05-29 DIAGNOSIS — M1990 Unspecified osteoarthritis, unspecified site: Secondary | ICD-10-CM | POA: Diagnosis not present

## 2016-05-29 DIAGNOSIS — S46001D Unspecified injury of muscle(s) and tendon(s) of the rotator cuff of right shoulder, subsequent encounter: Secondary | ICD-10-CM | POA: Diagnosis not present

## 2016-05-29 DIAGNOSIS — F411 Generalized anxiety disorder: Secondary | ICD-10-CM | POA: Diagnosis not present

## 2016-06-03 ENCOUNTER — Other Ambulatory Visit: Payer: Self-pay | Admitting: Family Medicine

## 2016-06-03 DIAGNOSIS — S46001D Unspecified injury of muscle(s) and tendon(s) of the rotator cuff of right shoulder, subsequent encounter: Secondary | ICD-10-CM | POA: Diagnosis not present

## 2016-06-03 DIAGNOSIS — F411 Generalized anxiety disorder: Secondary | ICD-10-CM | POA: Diagnosis not present

## 2016-06-03 DIAGNOSIS — Z96611 Presence of right artificial shoulder joint: Secondary | ICD-10-CM | POA: Diagnosis not present

## 2016-06-03 DIAGNOSIS — M1990 Unspecified osteoarthritis, unspecified site: Secondary | ICD-10-CM | POA: Diagnosis not present

## 2016-06-03 DIAGNOSIS — M159 Polyosteoarthritis, unspecified: Secondary | ICD-10-CM

## 2016-06-03 DIAGNOSIS — I10 Essential (primary) hypertension: Secondary | ICD-10-CM | POA: Diagnosis not present

## 2016-06-03 DIAGNOSIS — Z471 Aftercare following joint replacement surgery: Secondary | ICD-10-CM | POA: Diagnosis not present

## 2016-06-03 DIAGNOSIS — M150 Primary generalized (osteo)arthritis: Principal | ICD-10-CM

## 2016-06-03 MED ORDER — CELECOXIB 200 MG PO CAPS: 200.0000 mg | ORAL_CAPSULE | Freq: Every day | ORAL | 0 refills | Status: AC

## 2016-06-03 NOTE — Telephone Encounter (Signed)
Patient called and authorized a refill for celebrex.  She gets this through [employer].  Called [facility] and placed order.  They will send to [place] for patient to pickup.  Order number [id number].

## 2016-06-03 NOTE — Telephone Encounter (Signed)
Relation to [id number] Call back number:[phone]   Reason for call:  Patient requesting a refill celecoxib (CELEBREX) 200 MG capsule, please send to "[facility]" please advise

## 2016-06-04 ENCOUNTER — Telehealth: Payer: Self-pay | Admitting: Family Medicine

## 2016-06-04 NOTE — Telephone Encounter (Signed)
Caller name: Relationship to patient: Self Can be reached: [phone]    Reason for call: Patient states that a report from the provider requesting a mobile chair needs to be re-faxed. Plse fax to [phone]. Or mail to: [facility]. Attention: Prior Authorization Request. [id number], [place], [place] [phone].  Phone: [phone] Patient's Case Number is: [id number]

## 2016-06-05 DIAGNOSIS — Z01818 Encounter for other preprocedural examination: Secondary | ICD-10-CM | POA: Diagnosis not present

## 2016-06-05 DIAGNOSIS — H9193 Unspecified hearing loss, bilateral: Secondary | ICD-10-CM | POA: Diagnosis not present

## 2016-06-05 DIAGNOSIS — I10 Essential (primary) hypertension: Secondary | ICD-10-CM | POA: Diagnosis not present

## 2016-06-05 DIAGNOSIS — M2578 Osteophyte, vertebrae: Secondary | ICD-10-CM | POA: Diagnosis not present

## 2016-06-05 DIAGNOSIS — Z7182 Exercise counseling: Secondary | ICD-10-CM | POA: Diagnosis not present

## 2016-06-05 DIAGNOSIS — K5909 Other constipation: Secondary | ICD-10-CM | POA: Diagnosis not present

## 2016-06-05 DIAGNOSIS — H6123 Impacted cerumen, bilateral: Secondary | ICD-10-CM | POA: Diagnosis not present

## 2016-06-05 DIAGNOSIS — Z1389 Encounter for screening for other disorder: Secondary | ICD-10-CM | POA: Diagnosis not present

## 2016-06-05 DIAGNOSIS — R54 Age-related physical debility: Secondary | ICD-10-CM | POA: Diagnosis not present

## 2016-06-05 DIAGNOSIS — R5381 Other malaise: Secondary | ICD-10-CM | POA: Diagnosis not present

## 2016-06-05 DIAGNOSIS — Z713 Dietary counseling and surveillance: Secondary | ICD-10-CM | POA: Diagnosis not present

## 2016-06-05 NOTE — Telephone Encounter (Signed)
Faxed request for mobil chair / power chair orders to [facility], per patient's request. LB

## 2016-06-05 NOTE — Telephone Encounter (Signed)
Fax confirmation received. LB

## 2016-06-07 DIAGNOSIS — Z96611 Presence of right artificial shoulder joint: Secondary | ICD-10-CM | POA: Diagnosis not present

## 2016-06-07 DIAGNOSIS — S46001D Unspecified injury of muscle(s) and tendon(s) of the rotator cuff of right shoulder, subsequent encounter: Secondary | ICD-10-CM | POA: Diagnosis not present

## 2016-06-07 DIAGNOSIS — M1990 Unspecified osteoarthritis, unspecified site: Secondary | ICD-10-CM | POA: Diagnosis not present

## 2016-06-07 DIAGNOSIS — Z471 Aftercare following joint replacement surgery: Secondary | ICD-10-CM | POA: Diagnosis not present

## 2016-06-07 DIAGNOSIS — F411 Generalized anxiety disorder: Secondary | ICD-10-CM | POA: Diagnosis not present

## 2016-06-07 DIAGNOSIS — I10 Essential (primary) hypertension: Secondary | ICD-10-CM | POA: Diagnosis not present

## 2016-06-10 ENCOUNTER — Telehealth: Payer: Self-pay

## 2016-06-10 DIAGNOSIS — Z96611 Presence of right artificial shoulder joint: Secondary | ICD-10-CM | POA: Diagnosis not present

## 2016-06-10 DIAGNOSIS — F411 Generalized anxiety disorder: Secondary | ICD-10-CM | POA: Diagnosis not present

## 2016-06-10 DIAGNOSIS — M1990 Unspecified osteoarthritis, unspecified site: Secondary | ICD-10-CM | POA: Diagnosis not present

## 2016-06-10 DIAGNOSIS — I10 Essential (primary) hypertension: Secondary | ICD-10-CM | POA: Diagnosis not present

## 2016-06-10 DIAGNOSIS — S46001D Unspecified injury of muscle(s) and tendon(s) of the rotator cuff of right shoulder, subsequent encounter: Secondary | ICD-10-CM | POA: Diagnosis not present

## 2016-06-10 DIAGNOSIS — Z471 Aftercare following joint replacement surgery: Secondary | ICD-10-CM | POA: Diagnosis not present

## 2016-06-10 NOTE — Telephone Encounter (Signed)
Spoke with pt's son, informed pt's son that pt's Rx Celebrex is ready for pick up. Pt's son stated he will pick up Rx for pt some time today. LB

## 2016-06-12 ENCOUNTER — Telehealth: Payer: Self-pay | Admitting: Family Medicine

## 2016-06-12 DIAGNOSIS — S46001D Unspecified injury of muscle(s) and tendon(s) of the rotator cuff of right shoulder, subsequent encounter: Secondary | ICD-10-CM | POA: Diagnosis not present

## 2016-06-12 DIAGNOSIS — Z96611 Presence of right artificial shoulder joint: Secondary | ICD-10-CM | POA: Diagnosis not present

## 2016-06-12 DIAGNOSIS — I10 Essential (primary) hypertension: Secondary | ICD-10-CM | POA: Diagnosis not present

## 2016-06-12 DIAGNOSIS — F411 Generalized anxiety disorder: Secondary | ICD-10-CM | POA: Diagnosis not present

## 2016-06-12 DIAGNOSIS — Z471 Aftercare following joint replacement surgery: Secondary | ICD-10-CM | POA: Diagnosis not present

## 2016-06-12 DIAGNOSIS — M1990 Unspecified osteoarthritis, unspecified site: Secondary | ICD-10-CM | POA: Diagnosis not present

## 2016-06-12 NOTE — Telephone Encounter (Signed)
Ok to continue OT as requested?

## 2016-06-12 NOTE — Telephone Encounter (Signed)
Are they asking for OT due to balance issues/gait?. I could give verbal just want know the reason.

## 2016-06-12 NOTE — Telephone Encounter (Signed)
Caller name: [name]  Relation to pt: OT from [place]  Call back number: [phone]   Reason for call:  Requesting verbal orders continue until surgery, surgery date 06/12/16

## 2016-06-13 NOTE — Telephone Encounter (Signed)
Verbal orders left on voicemail. Requested call back for reason for OT.

## 2016-06-21 DIAGNOSIS — M1990 Unspecified osteoarthritis, unspecified site: Secondary | ICD-10-CM | POA: Diagnosis not present

## 2016-06-21 DIAGNOSIS — F411 Generalized anxiety disorder: Secondary | ICD-10-CM | POA: Diagnosis not present

## 2016-06-21 DIAGNOSIS — Z96611 Presence of right artificial shoulder joint: Secondary | ICD-10-CM | POA: Diagnosis not present

## 2016-06-21 DIAGNOSIS — S46001D Unspecified injury of muscle(s) and tendon(s) of the rotator cuff of right shoulder, subsequent encounter: Secondary | ICD-10-CM | POA: Diagnosis not present

## 2016-06-21 DIAGNOSIS — I10 Essential (primary) hypertension: Secondary | ICD-10-CM | POA: Diagnosis not present

## 2016-06-21 DIAGNOSIS — Z471 Aftercare following joint replacement surgery: Secondary | ICD-10-CM | POA: Diagnosis not present

## 2016-06-24 DIAGNOSIS — S46001D Unspecified injury of muscle(s) and tendon(s) of the rotator cuff of right shoulder, subsequent encounter: Secondary | ICD-10-CM | POA: Diagnosis not present

## 2016-06-24 DIAGNOSIS — M1990 Unspecified osteoarthritis, unspecified site: Secondary | ICD-10-CM | POA: Diagnosis not present

## 2016-06-24 DIAGNOSIS — I10 Essential (primary) hypertension: Secondary | ICD-10-CM | POA: Diagnosis not present

## 2016-06-24 DIAGNOSIS — Z471 Aftercare following joint replacement surgery: Secondary | ICD-10-CM | POA: Diagnosis not present

## 2016-06-24 DIAGNOSIS — F411 Generalized anxiety disorder: Secondary | ICD-10-CM | POA: Diagnosis not present

## 2016-06-24 DIAGNOSIS — Z96611 Presence of right artificial shoulder joint: Secondary | ICD-10-CM | POA: Diagnosis not present

## 2016-06-26 DIAGNOSIS — Z96611 Presence of right artificial shoulder joint: Secondary | ICD-10-CM | POA: Diagnosis not present

## 2016-06-26 DIAGNOSIS — F411 Generalized anxiety disorder: Secondary | ICD-10-CM | POA: Diagnosis not present

## 2016-06-26 DIAGNOSIS — Z471 Aftercare following joint replacement surgery: Secondary | ICD-10-CM | POA: Diagnosis not present

## 2016-06-26 DIAGNOSIS — S46001D Unspecified injury of muscle(s) and tendon(s) of the rotator cuff of right shoulder, subsequent encounter: Secondary | ICD-10-CM | POA: Diagnosis not present

## 2016-06-26 DIAGNOSIS — M1990 Unspecified osteoarthritis, unspecified site: Secondary | ICD-10-CM | POA: Diagnosis not present

## 2016-06-26 DIAGNOSIS — I10 Essential (primary) hypertension: Secondary | ICD-10-CM | POA: Diagnosis not present

## 2016-07-01 DIAGNOSIS — I10 Essential (primary) hypertension: Secondary | ICD-10-CM | POA: Diagnosis not present

## 2016-07-01 DIAGNOSIS — M1990 Unspecified osteoarthritis, unspecified site: Secondary | ICD-10-CM | POA: Diagnosis not present

## 2016-07-01 DIAGNOSIS — Z471 Aftercare following joint replacement surgery: Secondary | ICD-10-CM | POA: Diagnosis not present

## 2016-07-01 DIAGNOSIS — F411 Generalized anxiety disorder: Secondary | ICD-10-CM | POA: Diagnosis not present

## 2016-07-01 DIAGNOSIS — Z96611 Presence of right artificial shoulder joint: Secondary | ICD-10-CM | POA: Diagnosis not present

## 2016-07-01 DIAGNOSIS — S46001D Unspecified injury of muscle(s) and tendon(s) of the rotator cuff of right shoulder, subsequent encounter: Secondary | ICD-10-CM | POA: Diagnosis not present

## 2016-07-02 ENCOUNTER — Ambulatory Visit (INDEPENDENT_AMBULATORY_CARE_PROVIDER_SITE_OTHER): Payer: Medicare Other | Admitting: Family Medicine

## 2016-07-02 ENCOUNTER — Encounter: Payer: Self-pay | Admitting: Family Medicine

## 2016-07-02 DIAGNOSIS — G8929 Other chronic pain: Secondary | ICD-10-CM

## 2016-07-02 DIAGNOSIS — E785 Hyperlipidemia, unspecified: Secondary | ICD-10-CM | POA: Diagnosis not present

## 2016-07-02 DIAGNOSIS — M5010 Cervical disc disorder with radiculopathy, unspecified cervical region: Secondary | ICD-10-CM

## 2016-07-02 DIAGNOSIS — I10 Essential (primary) hypertension: Secondary | ICD-10-CM | POA: Diagnosis not present

## 2016-07-02 DIAGNOSIS — M25512 Pain in left shoulder: Secondary | ICD-10-CM | POA: Diagnosis not present

## 2016-07-02 NOTE — Assessment & Plan Note (Signed)
F/u Dr Onnie Graham

## 2016-07-02 NOTE — Progress Notes (Signed)
Pre visit review using our clinic review tool, if applicable. No additional management support is needed unless otherwise documented below in the visit note. 

## 2016-07-02 NOTE — Assessment & Plan Note (Signed)
Awaiting ortho at [facility] to call with date for surgery She has been cleared by the geriatric clinic there

## 2016-07-02 NOTE — Patient Instructions (Signed)
Hypertension Hypertension, commonly called high blood pressure, is when the force of blood pumping through your arteries is too strong. Your arteries are the blood vessels that carry blood from your heart throughout your body. A blood pressure reading consists of a higher number over a lower number, such as 110/72. The higher number (systolic) is the pressure inside your arteries when your heart pumps. The lower number (diastolic) is the pressure inside your arteries when your heart relaxes. Ideally you want your blood pressure below 120/80. Hypertension forces your heart to work harder to pump blood. Your arteries may become narrow or stiff. Having untreated or uncontrolled hypertension can cause heart attack, stroke, kidney disease, and other problems. What increases the risk? Some risk factors for high blood pressure are controllable. Others are not. Risk factors you cannot control include:  Race. You may be at higher risk if you are African American.  Age. Risk increases with age.  Gender. Men are at higher risk than women before age 45 years. After age 65, women are at higher risk than men. Risk factors you can control include:  Not getting enough exercise or physical activity.  Being overweight.  Getting too much fat, sugar, calories, or salt in your diet.  Drinking too much alcohol. What are the signs or symptoms? Hypertension does not usually cause signs or symptoms. Extremely high blood pressure (hypertensive crisis) may cause headache, anxiety, shortness of breath, and nosebleed. How is this diagnosed? To check if you have hypertension, your health care provider will measure your blood pressure while you are seated, with your arm held at the level of your heart. It should be measured at least twice using the same arm. Certain conditions can cause a difference in blood pressure between your right and left arms. A blood pressure reading that is higher than normal on one occasion does  not mean that you need treatment. If it is not clear whether you have high blood pressure, you may be asked to return on a different day to have your blood pressure checked again. Or, you may be asked to monitor your blood pressure at home for 1 or more weeks. How is this treated? Treating high blood pressure includes making lifestyle changes and possibly taking medicine. Living a healthy lifestyle can help lower high blood pressure. You may need to change some of your habits. Lifestyle changes may include:  Following the DASH diet. This diet is high in fruits, vegetables, and whole grains. It is low in salt, red meat, and added sugars.  Keep your sodium intake below 2,300 mg per day.  Getting at least 30-45 minutes of aerobic exercise at least 4 times per week.  Losing weight if necessary.  Not smoking.  Limiting alcoholic beverages.  Learning ways to reduce stress. Your health care provider may prescribe medicine if lifestyle changes are not enough to get your blood pressure under control, and if one of the following is true:  You are 18-59 years of age and your systolic blood pressure is above 140.  You are 60 years of age or older, and your systolic blood pressure is above 150.  Your diastolic blood pressure is above 90.  You have diabetes, and your systolic blood pressure is over 140 or your diastolic blood pressure is over 90.  You have kidney disease and your blood pressure is above 140/90.  You have heart disease and your blood pressure is above 140/90. Your personal target blood pressure may vary depending on your medical   conditions, your age, and other factors. Follow these instructions at home:  Have your blood pressure rechecked as directed by your health care provider.  Take medicines only as directed by your health care provider. Follow the directions carefully. Blood pressure medicines must be taken as prescribed. The medicine does not work as well when you skip  doses. Skipping doses also puts you at risk for problems.  Do not smoke.  Monitor your blood pressure at home as directed by your health care provider. Contact a health care provider if:  You think you are having a reaction to medicines taken.  You have recurrent headaches or feel dizzy.  You have swelling in your ankles.  You have trouble with your vision. Get help right away if:  You develop a severe headache or confusion.  You have unusual weakness, numbness, or feel faint.  You have severe chest or abdominal pain.  You vomit repeatedly.  You have trouble breathing. This information is not intended to replace advice given to you by your health care provider. Make sure you discuss any questions you have with your health care provider. Document Released: 06/10/2005 Document Revised: 11/16/2015 Document Reviewed: 04/02/2013 Elsevier Interactive Patient Education  2017 Elsevier Inc.  

## 2016-07-02 NOTE — Progress Notes (Signed)
Subjective:    Patient ID: Katherine Owen, female    DOB: 1927/09/23, 83 y.o.   MRN: [id number]  Chief Complaint  Patient presents with  . Hypertension    follow up    HPI Patient is in today for follow up appointment. Pt have no concerns.  Pt was seen by neurosurgery at [facility] for bone that is pressing on nerve in neck-- she is supposed to have surgery.   Past Medical History:  Diagnosis Date  . Allergy   . Arthritis   . Cataract    Bilateral  . Essential hypertension 05/02/2014  . Headache   . History of shingles   . HLD (hyperlipidemia) 05/02/2014  . Hyperlipidemia   . Hypertension   . Neuropathic pain 05/02/2014  . Osteoarthritis of both shoulders due to rotator cuff injury 04/23/2016  . Poor mobility 08/28/2015  . 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 Spinal stenosis of lumbar region 05/02/2014   S/p decompression at [facility] November 2015     Past Surgical History:  Procedure Laterality Date  . ANKLE SURGERY Left 2014  . BACK SURGERY  2015   [facility]   . BUNIONECTOMY Right   . COLONOSCOPY    . EYE SURGERY     cataract  . REVERSE SHOULDER ARTHROPLASTY Right 01/11/2016   Procedure: REVERSE SHOULDER ARTHROPLASTY;  Surgeon: Justice Britain, MD;  Location: [place];  Service: Orthopedics;  Laterality: Right;  . SHOULDER SURGERY  01/11/2016   REVERSE SHOULDER ARTHROPLASTY on 01/11/2016  . SPINE SURGERY     done at [facility] Apr 26, 2014, lumbar decompression  . TOTAL HIP ARTHROPLASTY Bilateral 2005, 2009   [place] , [place]    Family History  Problem Relation Age of Onset  . Cancer Mother     colon  . Arthritis Mother     osteoarthritis  . Arthritis Father     osteoarthritis  . Parkinson's disease Father   . Anesthesia problems Neg Hx     Social History   Social History  . Marital status: Widowed    Spouse name: N/A  . Number of children: 3  . Years of education: N/A   Occupational History  . 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 Smoking status: Former Smoker    Packs/day: 1.00    Years: 20.00    Types: Cigarettes  . Smokeless tobacco: Never Used     Comment: quit somking in early  1970's  . Alcohol use No  . Drug use: No  . Sexual activity: Not Currently   Other Topics Concern  . Not on file   Social History Narrative  . No narrative on file    Outpatient Medications Prior to Visit  Medication Sig Dispense Refill  . Aspirin-Acetaminophen-Caffeine (EXCEDRIN EXTRA STRENGTH PO) Take 1 tablet by mouth 2 (two) times daily as needed (pain).     Marland Kitchen atorvastatin (LIPITOR) 10 MG tablet Take 1 tablet (10 mg total) by mouth at bedtime. For high cholesterol 90 tablet 1  . celecoxib (CELEBREX) 200 MG capsule Take 1 capsule (200 mg total) by mouth daily. 100 capsule 0  . cetirizine (ZYRTEC ALLERGY) 10 MG tablet Take 1 tablet (10 mg total) by mouth at bedtime.    Marland Kitchen losartan-hydrochlorothiazide (HYZAAR) 100-12.5 MG tablet Take 1 tablet by mouth daily. 90 tablet 1  . Misc. Devices [facility]) [place] wheel Chair. Use as directed 1 each 0  . montelukast (SINGULAIR) 10 MG tablet Take 1  tablet (10 mg total) by mouth at bedtime. 90 tablet 3  . Multiple Vitamins-Minerals (CENTRUM SILVER) tablet Take 1 tablet by mouth daily.    . NONFORMULARY OR COMPOUNDED ITEM Power wheelchair  Dx spinal stenosis,  Neuropathy, poor mobility 1 each 0  . senna-docusate (SENOKOT-S) 8.6-50 MG per tablet Take 2 tablets by mouth 2 (two) times daily. For constipation     . traMADol (ULTRAM) 50 MG tablet Take 1 tablet (50 mg total) by mouth 3 (three) times daily. 90 tablet 5  . ondansetron (ZOFRAN) 4 MG tablet Take 1 tablet (4 mg total) by mouth every 8 (eight) hours as needed for nausea or vomiting. (Patient not taking: Reported on 07/02/2016) 20 tablet 0   No facility-administered medications prior to visit.     Allergies  Allergen Reactions  . No Known Allergies     Review of Systems  Constitutional: Negative for fever.  HENT: Negative  for congestion.   Eyes: Negative for blurred vision.  Respiratory: Negative for cough.   Cardiovascular: Negative for chest pain and palpitations.  Gastrointestinal: Negative for vomiting.  Musculoskeletal: Negative for back pain.  Skin: Negative for rash.  Neurological: Negative for loss of consciousness and headaches.       Objective:    Physical Exam  Constitutional: She is oriented to person, place, and time. She appears well-developed and well-nourished. No distress.  HENT:  Head: Normocephalic and atraumatic.  Eyes: Conjunctivae are normal. Pupils are equal, round, and reactive to light.  Neck: Normal range of motion. No thyromegaly present.  Cardiovascular: Normal rate and regular rhythm.   Pulmonary/Chest: Effort normal and breath sounds normal. She has no wheezes.  Abdominal: Soft. Bowel sounds are normal. There is no tenderness.  Musculoskeletal: Normal range of motion. She exhibits no edema or deformity.  Neurological: She is alert and oriented to person, place, and time.  Skin: Skin is warm and dry. She is not diaphoretic.  Psychiatric: She has a normal mood and affect.    BP 138/78 (BP Location: Right Arm, Patient Position: Sitting, Cuff Size: Normal)   Pulse 80   Temp 98.1 F (36.7 C) (Oral)   Resp 16   Ht 5\' 5"  (1.651 m)   Wt 162 lb (73.5 kg)   SpO2 93%   BMI 26.96 kg/m  Wt Readings from Last 3 Encounters:  07/02/16 162 lb (73.5 kg)  05/21/16 142 lb (64.4 kg)  03/13/16 142 lb 3.2 oz (64.5 kg)     Lab Results  Component Value Date   WBC 10.4 04/23/2016   HGB 12.4 04/23/2016   HCT 37.8 04/23/2016   PLT 260.0 04/23/2016   GLUCOSE 70 04/23/2016   CHOL 178 04/23/2016   TRIG 82.0 04/23/2016   HDL 66.20 04/23/2016   LDLCALC 95 04/23/2016   ALT 20 04/23/2016   AST 29 04/23/2016   NA 140 04/23/2016   K 4.4 04/23/2016   CL 103 04/23/2016   CREATININE 0.51 04/23/2016   BUN 19 04/23/2016   CO2 29 04/23/2016   TSH 1.31 10/27/2014    Lab Results    Component Value Date   TSH 1.31 10/27/2014   Lab Results  Component Value Date   WBC 10.4 04/23/2016   HGB 12.4 04/23/2016   HCT 37.8 04/23/2016   MCV 79.9 04/23/2016   PLT 260.0 04/23/2016   Lab Results  Component Value Date   NA 140 04/23/2016   K 4.4 04/23/2016   CO2 29 04/23/2016   GLUCOSE 70 04/23/2016  BUN 19 04/23/2016   CREATININE 0.51 04/23/2016   BILITOT 0.4 04/23/2016   ALKPHOS 95 04/23/2016   AST 29 04/23/2016   ALT 20 04/23/2016   PROT 7.1 04/23/2016   ALBUMIN 4.0 04/23/2016   CALCIUM 10.1 04/23/2016   ANIONGAP 7 01/08/2016   GFR 146.91 04/23/2016   Lab Results  Component Value Date   CHOL 178 04/23/2016   Lab Results  Component Value Date   HDL 66.20 04/23/2016   Lab Results  Component Value Date   LDLCALC 95 04/23/2016   Lab Results  Component Value Date   TRIG 82.0 04/23/2016   Lab Results  Component Value Date   CHOLHDL 3 04/23/2016   No results found for: HGBA1C     Assessment & Plan:   Problem List Items Addressed This Visit      Unprioritized   Cervical disc disorder with radiculopathy of cervical region    Awaiting ortho at [facility] to call with date for surgery She has been cleared by the geriatric clinic there       Left shoulder pain    F/u Dr Onnie Graham      Essential hypertension    Stable con't losartan/hct      HLD (hyperlipidemia)    con't lipitor Lab Results  Component Value Date   CHOL 178 04/23/2016   HDL 66.20 04/23/2016   [place] 95 04/23/2016   TRIG 82.0 04/23/2016   CHOLHDL 3 04/23/2016           I have discontinued Ms. Cudd's ondansetron. I am also having her maintain her senna-docusate, CENTRUM SILVER, Aspirin-Acetaminophen-Caffeine (EXCEDRIN EXTRA STRENGTH PO), montelukast, cetirizine, atorvastatin, traMADol, Wheelchair, losartan-hydrochlorothiazide, NONFORMULARY OR COMPOUNDED ITEM, and celecoxib.  No orders of the defined types were placed in this encounter.   CMA served as Education administrator during  this visit. History, Physical and Plan performed by medical provider. Documentation and orders reviewed and attested to.   Ann Held, DO

## 2016-07-03 DIAGNOSIS — M1990 Unspecified osteoarthritis, unspecified site: Secondary | ICD-10-CM | POA: Diagnosis not present

## 2016-07-03 DIAGNOSIS — Z96611 Presence of right artificial shoulder joint: Secondary | ICD-10-CM | POA: Diagnosis not present

## 2016-07-03 DIAGNOSIS — I10 Essential (primary) hypertension: Secondary | ICD-10-CM | POA: Diagnosis not present

## 2016-07-03 DIAGNOSIS — Z471 Aftercare following joint replacement surgery: Secondary | ICD-10-CM | POA: Diagnosis not present

## 2016-07-03 DIAGNOSIS — S46001D Unspecified injury of muscle(s) and tendon(s) of the rotator cuff of right shoulder, subsequent encounter: Secondary | ICD-10-CM | POA: Diagnosis not present

## 2016-07-03 DIAGNOSIS — F411 Generalized anxiety disorder: Secondary | ICD-10-CM | POA: Diagnosis not present

## 2016-07-03 NOTE — Assessment & Plan Note (Signed)
con't lipitor Lab Results  Component Value Date   CHOL 178 04/23/2016   HDL 66.20 04/23/2016   LDLCALC 95 04/23/2016   TRIG 82.0 04/23/2016   CHOLHDL 3 04/23/2016

## 2016-07-03 NOTE — Assessment & Plan Note (Signed)
Stable con't losartan/hct

## 2016-07-04 ENCOUNTER — Other Ambulatory Visit: Payer: Self-pay | Admitting: Family Medicine

## 2016-07-04 DIAGNOSIS — M159 Polyosteoarthritis, unspecified: Secondary | ICD-10-CM

## 2016-07-04 DIAGNOSIS — M150 Primary generalized (osteo)arthritis: Principal | ICD-10-CM

## 2016-07-04 DIAGNOSIS — E785 Hyperlipidemia, unspecified: Secondary | ICD-10-CM

## 2016-07-04 NOTE — Telephone Encounter (Signed)
Caller name: Relationship to patient: Self Can be reached:  [phone]  Pharmacy: [facility] [phone] (Phone) [phone] (Fax)     Reason for call: Request Rx be faxed for traMADol (ULTRAM) 50 MG tablet  atorvastatin (LIPITOR) 10 MG tablet

## 2016-07-05 MED ORDER — TRAMADOL HCL 50 MG PO TABS: 50.0000 mg | ORAL_TABLET | Freq: Three times a day (TID) | ORAL | 0 refills | Status: DC

## 2016-07-05 MED ORDER — ATORVASTATIN CALCIUM 10 MG PO TABS: 10.0000 mg | ORAL_TABLET | Freq: Every day | ORAL | 1 refills | Status: DC

## 2016-07-05 NOTE — Telephone Encounter (Signed)
Do you want to start back writing her tramadol?  Last written 01/15/16 by a nurse practictioner, I think with geriatric medicine.  She was last seen here 07/02/16.   If so she would need to come in to sign contract and UDS.

## 2016-07-05 NOTE — Telephone Encounter (Signed)
Ok to write #90 1 refill

## 2016-07-05 NOTE — Telephone Encounter (Signed)
Patient will do UDS and sign contract on next visit since she is wheelchair bound.  rx faxed to pharmacy

## 2016-07-05 NOTE — Telephone Encounter (Signed)
Patient checking on the status of atorvastatin (LIPITOR) 10 MG tablet and traMADol (ULTRAM) 50 MG tablet refill, patient states she would like to hear back from the nurse today, please advise best # [phone]

## 2016-07-10 DIAGNOSIS — M1990 Unspecified osteoarthritis, unspecified site: Secondary | ICD-10-CM | POA: Diagnosis not present

## 2016-07-10 DIAGNOSIS — Z96611 Presence of right artificial shoulder joint: Secondary | ICD-10-CM | POA: Diagnosis not present

## 2016-07-10 DIAGNOSIS — S46001D Unspecified injury of muscle(s) and tendon(s) of the rotator cuff of right shoulder, subsequent encounter: Secondary | ICD-10-CM | POA: Diagnosis not present

## 2016-07-10 DIAGNOSIS — Z471 Aftercare following joint replacement surgery: Secondary | ICD-10-CM | POA: Diagnosis not present

## 2016-07-10 DIAGNOSIS — I10 Essential (primary) hypertension: Secondary | ICD-10-CM | POA: Diagnosis not present

## 2016-07-10 DIAGNOSIS — F411 Generalized anxiety disorder: Secondary | ICD-10-CM | POA: Diagnosis not present

## 2016-07-16 ENCOUNTER — Telehealth: Payer: Self-pay | Admitting: Family Medicine

## 2016-07-16 MED ORDER — ATORVASTATIN CALCIUM 10 MG PO TABS: 10.0000 mg | ORAL_TABLET | Freq: Every day | ORAL | 1 refills | Status: DC

## 2016-07-16 NOTE — Telephone Encounter (Signed)
Rx resent.   Patient notified.

## 2016-07-16 NOTE — Telephone Encounter (Signed)
Pt called in because she spoke with pharmacy and was told that they never received Rx for Atorvastatin. She says that she would like to have someone look into it.      [id number] [place], [place] [phone] (Phone) [phone] (Fax)

## 2016-07-16 NOTE — Addendum Note (Signed)
Addended by: Kem Boroughs D on: 07/16/2016 03:00 PM   Modules accepted: Orders

## 2016-07-22 DIAGNOSIS — R4189 Other symptoms and signs involving cognitive functions and awareness: Secondary | ICD-10-CM | POA: Diagnosis not present

## 2016-07-22 DIAGNOSIS — Z993 Dependence on wheelchair: Secondary | ICD-10-CM | POA: Diagnosis not present

## 2016-07-22 DIAGNOSIS — E785 Hyperlipidemia, unspecified: Secondary | ICD-10-CM | POA: Diagnosis not present

## 2016-07-22 DIAGNOSIS — Z981 Arthrodesis status: Secondary | ICD-10-CM | POA: Diagnosis not present

## 2016-07-22 DIAGNOSIS — J309 Allergic rhinitis, unspecified: Secondary | ICD-10-CM | POA: Diagnosis not present

## 2016-07-22 DIAGNOSIS — K5909 Other constipation: Secondary | ICD-10-CM | POA: Diagnosis not present

## 2016-07-22 DIAGNOSIS — M4712 Other spondylosis with myelopathy, cervical region: Secondary | ICD-10-CM | POA: Diagnosis not present

## 2016-07-22 DIAGNOSIS — G8918 Other acute postprocedural pain: Secondary | ICD-10-CM | POA: Diagnosis not present

## 2016-07-22 DIAGNOSIS — H9193 Unspecified hearing loss, bilateral: Secondary | ICD-10-CM | POA: Diagnosis present

## 2016-07-22 DIAGNOSIS — M5001 Cervical disc disorder with myelopathy,  high cervical region: Secondary | ICD-10-CM | POA: Diagnosis not present

## 2016-07-22 DIAGNOSIS — Z7401 Bed confinement status: Secondary | ICD-10-CM | POA: Diagnosis not present

## 2016-07-22 DIAGNOSIS — F09 Unspecified mental disorder due to known physiological condition: Secondary | ICD-10-CM | POA: Diagnosis present

## 2016-07-22 DIAGNOSIS — G8191 Hemiplegia, unspecified affecting right dominant side: Secondary | ICD-10-CM | POA: Diagnosis present

## 2016-07-22 DIAGNOSIS — Z741 Need for assistance with personal care: Secondary | ICD-10-CM | POA: Diagnosis not present

## 2016-07-22 DIAGNOSIS — Z7189 Other specified counseling: Secondary | ICD-10-CM | POA: Diagnosis not present

## 2016-07-22 DIAGNOSIS — Z79899 Other long term (current) drug therapy: Secondary | ICD-10-CM | POA: Diagnosis not present

## 2016-07-22 DIAGNOSIS — R2681 Unsteadiness on feet: Secondary | ICD-10-CM | POA: Diagnosis not present

## 2016-07-22 DIAGNOSIS — M6281 Muscle weakness (generalized): Secondary | ICD-10-CM | POA: Diagnosis not present

## 2016-07-22 DIAGNOSIS — M542 Cervicalgia: Secondary | ICD-10-CM | POA: Diagnosis not present

## 2016-07-22 DIAGNOSIS — I10 Essential (primary) hypertension: Secondary | ICD-10-CM | POA: Diagnosis present

## 2016-07-22 DIAGNOSIS — Z87891 Personal history of nicotine dependence: Secondary | ICD-10-CM | POA: Diagnosis not present

## 2016-07-25 DIAGNOSIS — Z7401 Bed confinement status: Secondary | ICD-10-CM | POA: Diagnosis not present

## 2016-07-25 DIAGNOSIS — M5001 Cervical disc disorder with myelopathy,  high cervical region: Secondary | ICD-10-CM | POA: Diagnosis not present

## 2016-07-25 DIAGNOSIS — Z4889 Encounter for other specified surgical aftercare: Secondary | ICD-10-CM | POA: Diagnosis not present

## 2016-07-25 DIAGNOSIS — M542 Cervicalgia: Secondary | ICD-10-CM | POA: Diagnosis not present

## 2016-07-25 DIAGNOSIS — Z471 Aftercare following joint replacement surgery: Secondary | ICD-10-CM | POA: Diagnosis not present

## 2016-07-25 DIAGNOSIS — Z741 Need for assistance with personal care: Secondary | ICD-10-CM | POA: Diagnosis not present

## 2016-07-25 DIAGNOSIS — M6281 Muscle weakness (generalized): Secondary | ICD-10-CM | POA: Diagnosis not present

## 2016-07-25 DIAGNOSIS — M25512 Pain in left shoulder: Secondary | ICD-10-CM | POA: Diagnosis not present

## 2016-07-25 DIAGNOSIS — Z96611 Presence of right artificial shoulder joint: Secondary | ICD-10-CM | POA: Diagnosis not present

## 2016-07-25 DIAGNOSIS — R262 Difficulty in walking, not elsewhere classified: Secondary | ICD-10-CM | POA: Diagnosis not present

## 2016-07-25 DIAGNOSIS — M19012 Primary osteoarthritis, left shoulder: Secondary | ICD-10-CM | POA: Diagnosis not present

## 2016-07-25 DIAGNOSIS — G8929 Other chronic pain: Secondary | ICD-10-CM | POA: Diagnosis not present

## 2016-07-25 DIAGNOSIS — M50022 Cervical disc disorder at C5-C6 level with myelopathy: Secondary | ICD-10-CM | POA: Diagnosis not present

## 2016-07-25 DIAGNOSIS — I10 Essential (primary) hypertension: Secondary | ICD-10-CM | POA: Diagnosis not present

## 2016-07-25 DIAGNOSIS — G6289 Other specified polyneuropathies: Secondary | ICD-10-CM | POA: Diagnosis not present

## 2016-07-25 DIAGNOSIS — R5381 Other malaise: Secondary | ICD-10-CM | POA: Diagnosis not present

## 2016-07-25 DIAGNOSIS — K5909 Other constipation: Secondary | ICD-10-CM | POA: Diagnosis not present

## 2016-07-25 DIAGNOSIS — M50323 Other cervical disc degeneration at C6-C7 level: Secondary | ICD-10-CM | POA: Diagnosis not present

## 2016-07-25 DIAGNOSIS — Z981 Arthrodesis status: Secondary | ICD-10-CM | POA: Diagnosis not present

## 2016-07-25 DIAGNOSIS — E784 Other hyperlipidemia: Secondary | ICD-10-CM | POA: Diagnosis not present

## 2016-07-25 DIAGNOSIS — G9589 Other specified diseases of spinal cord: Secondary | ICD-10-CM | POA: Diagnosis not present

## 2016-07-25 DIAGNOSIS — M4322 Fusion of spine, cervical region: Secondary | ICD-10-CM | POA: Diagnosis not present

## 2016-07-25 DIAGNOSIS — R2681 Unsteadiness on feet: Secondary | ICD-10-CM | POA: Diagnosis not present

## 2016-07-25 NOTE — Telephone Encounter (Signed)
Error. Completed.

## 2016-07-26 DIAGNOSIS — R262 Difficulty in walking, not elsewhere classified: Secondary | ICD-10-CM | POA: Diagnosis not present

## 2016-07-26 DIAGNOSIS — I10 Essential (primary) hypertension: Secondary | ICD-10-CM | POA: Diagnosis not present

## 2016-07-26 DIAGNOSIS — G9589 Other specified diseases of spinal cord: Secondary | ICD-10-CM | POA: Diagnosis not present

## 2016-07-26 DIAGNOSIS — K5909 Other constipation: Secondary | ICD-10-CM | POA: Diagnosis not present

## 2016-07-30 DIAGNOSIS — G9589 Other specified diseases of spinal cord: Secondary | ICD-10-CM | POA: Diagnosis not present

## 2016-08-06 DIAGNOSIS — M19012 Primary osteoarthritis, left shoulder: Secondary | ICD-10-CM | POA: Diagnosis not present

## 2016-08-06 DIAGNOSIS — G9589 Other specified diseases of spinal cord: Secondary | ICD-10-CM | POA: Diagnosis not present

## 2016-08-07 DIAGNOSIS — Z4889 Encounter for other specified surgical aftercare: Secondary | ICD-10-CM | POA: Diagnosis not present

## 2016-08-23 DIAGNOSIS — G9589 Other specified diseases of spinal cord: Secondary | ICD-10-CM | POA: Diagnosis not present

## 2016-08-23 DIAGNOSIS — G6289 Other specified polyneuropathies: Secondary | ICD-10-CM | POA: Diagnosis not present

## 2016-08-23 DIAGNOSIS — M19012 Primary osteoarthritis, left shoulder: Secondary | ICD-10-CM | POA: Diagnosis not present

## 2016-08-30 DIAGNOSIS — M19012 Primary osteoarthritis, left shoulder: Secondary | ICD-10-CM | POA: Diagnosis not present

## 2016-08-30 DIAGNOSIS — G9589 Other specified diseases of spinal cord: Secondary | ICD-10-CM | POA: Diagnosis not present

## 2016-08-30 DIAGNOSIS — G6289 Other specified polyneuropathies: Secondary | ICD-10-CM | POA: Diagnosis not present

## 2016-09-02 DIAGNOSIS — I10 Essential (primary) hypertension: Secondary | ICD-10-CM | POA: Diagnosis not present

## 2016-09-02 DIAGNOSIS — Z471 Aftercare following joint replacement surgery: Secondary | ICD-10-CM | POA: Diagnosis not present

## 2016-09-02 DIAGNOSIS — M25512 Pain in left shoulder: Secondary | ICD-10-CM | POA: Diagnosis not present

## 2016-09-02 DIAGNOSIS — G8929 Other chronic pain: Secondary | ICD-10-CM | POA: Diagnosis not present

## 2016-09-02 DIAGNOSIS — G9589 Other specified diseases of spinal cord: Secondary | ICD-10-CM | POA: Diagnosis not present

## 2016-09-02 DIAGNOSIS — Z96611 Presence of right artificial shoulder joint: Secondary | ICD-10-CM | POA: Diagnosis not present

## 2016-09-02 DIAGNOSIS — R5381 Other malaise: Secondary | ICD-10-CM | POA: Diagnosis not present

## 2016-09-02 DIAGNOSIS — E784 Other hyperlipidemia: Secondary | ICD-10-CM | POA: Diagnosis not present

## 2016-09-02 DIAGNOSIS — M19012 Primary osteoarthritis, left shoulder: Secondary | ICD-10-CM | POA: Diagnosis not present

## 2016-09-04 DIAGNOSIS — M4322 Fusion of spine, cervical region: Secondary | ICD-10-CM | POA: Diagnosis not present

## 2016-09-04 DIAGNOSIS — M50323 Other cervical disc degeneration at C6-C7 level: Secondary | ICD-10-CM | POA: Diagnosis not present

## 2016-09-04 DIAGNOSIS — Z981 Arthrodesis status: Secondary | ICD-10-CM | POA: Diagnosis not present

## 2016-09-04 DIAGNOSIS — M50022 Cervical disc disorder at C5-C6 level with myelopathy: Secondary | ICD-10-CM | POA: Diagnosis not present

## 2016-09-04 DIAGNOSIS — M542 Cervicalgia: Secondary | ICD-10-CM | POA: Diagnosis not present

## 2016-09-13 ENCOUNTER — Inpatient Hospital Stay: Payer: Medicare Other | Admitting: Family Medicine

## 2016-09-13 ENCOUNTER — Telehealth: Payer: Self-pay | Admitting: Family Medicine

## 2016-09-13 DIAGNOSIS — Z96611 Presence of right artificial shoulder joint: Secondary | ICD-10-CM | POA: Diagnosis not present

## 2016-09-13 DIAGNOSIS — Z96642 Presence of left artificial hip joint: Secondary | ICD-10-CM | POA: Diagnosis not present

## 2016-09-13 DIAGNOSIS — I10 Essential (primary) hypertension: Secondary | ICD-10-CM | POA: Diagnosis not present

## 2016-09-13 DIAGNOSIS — Z96641 Presence of right artificial hip joint: Secondary | ICD-10-CM | POA: Diagnosis not present

## 2016-09-13 DIAGNOSIS — E785 Hyperlipidemia, unspecified: Secondary | ICD-10-CM | POA: Diagnosis not present

## 2016-09-13 DIAGNOSIS — Z4789 Encounter for other orthopedic aftercare: Secondary | ICD-10-CM | POA: Diagnosis not present

## 2016-09-13 NOTE — Telephone Encounter (Signed)
Caller name: Vida Roller Relationship to patient: [place] Can be reached: [phone] Pharmacy:  Reason for call: Need orders for Home Health PT 1 week 1 and 2 weeks 4 to focus on home safety and mobility, standing and balancing

## 2016-09-13 NOTE — Telephone Encounter (Signed)
HHRN informed (left detailed message ) ok per PCP for order.

## 2016-09-16 DIAGNOSIS — Z96611 Presence of right artificial shoulder joint: Secondary | ICD-10-CM | POA: Diagnosis not present

## 2016-09-16 DIAGNOSIS — E785 Hyperlipidemia, unspecified: Secondary | ICD-10-CM | POA: Diagnosis not present

## 2016-09-16 DIAGNOSIS — Z4789 Encounter for other orthopedic aftercare: Secondary | ICD-10-CM | POA: Diagnosis not present

## 2016-09-16 DIAGNOSIS — Z96641 Presence of right artificial hip joint: Secondary | ICD-10-CM | POA: Diagnosis not present

## 2016-09-16 DIAGNOSIS — I10 Essential (primary) hypertension: Secondary | ICD-10-CM | POA: Diagnosis not present

## 2016-09-16 DIAGNOSIS — Z96642 Presence of left artificial hip joint: Secondary | ICD-10-CM | POA: Diagnosis not present

## 2016-09-17 ENCOUNTER — Encounter: Payer: Self-pay | Admitting: Family Medicine

## 2016-09-17 ENCOUNTER — Ambulatory Visit (INDEPENDENT_AMBULATORY_CARE_PROVIDER_SITE_OTHER): Payer: Medicare Other | Admitting: Family Medicine

## 2016-09-17 VITALS — BP 130/70 | HR 87 | Temp 98.1°F | Resp 16

## 2016-09-17 DIAGNOSIS — M150 Primary generalized (osteo)arthritis: Secondary | ICD-10-CM

## 2016-09-17 DIAGNOSIS — I10 Essential (primary) hypertension: Secondary | ICD-10-CM | POA: Diagnosis not present

## 2016-09-17 DIAGNOSIS — L600 Ingrowing nail: Secondary | ICD-10-CM

## 2016-09-17 DIAGNOSIS — J302 Other seasonal allergic rhinitis: Secondary | ICD-10-CM | POA: Diagnosis not present

## 2016-09-17 DIAGNOSIS — E785 Hyperlipidemia, unspecified: Secondary | ICD-10-CM

## 2016-09-17 DIAGNOSIS — M159 Polyosteoarthritis, unspecified: Secondary | ICD-10-CM

## 2016-09-17 LAB — COMPREHENSIVE METABOLIC PANEL
ALBUMIN: 3.9 g/dL (ref 3.5–5.2)
ALT: 22 U/L (ref 0–35)
AST: 25 U/L (ref 0–37)
Alkaline Phosphatase: 111 U/L (ref 39–117)
BUN: 16 mg/dL (ref 6–23)
CO2: 32 meq/L (ref 19–32)
Calcium: 9.9 mg/dL (ref 8.4–10.5)
Chloride: 99 mEq/L (ref 96–112)
Creatinine, Ser: 0.48 mg/dL (ref 0.40–1.20)
GFR: 157.4 mL/min (ref >60.00)
GLUCOSE: 95 mg/dL (ref 70–99)
POTASSIUM: 4.1 meq/L (ref 3.5–5.1)
SODIUM: 137 meq/L (ref 135–145)
TOTAL PROTEIN: 6.8 g/dL (ref 6.0–8.3)
Total Bilirubin: 0.4 mg/dL (ref 0.2–1.2)

## 2016-09-17 LAB — LIPID PANEL
CHOL/HDL RATIO: 3
Cholesterol: 183 mg/dL (ref 0–200)
HDL: 58.7 mg/dL (ref >39.00)
LDL CALC: 92 mg/dL (ref 0–99)
NONHDL: 123.93
Triglycerides: 162 mg/dL — ABNORMAL HIGH (ref 0.0–149.0)
VLDL: 32.4 mg/dL (ref 0.0–40.0)

## 2016-09-17 MED ORDER — LOSARTAN POTASSIUM-HCTZ 100-12.5 MG PO TABS: 1.0000 | ORAL_TABLET | Freq: Every day | ORAL | 1 refills | Status: DC

## 2016-09-17 MED ORDER — MONTELUKAST SODIUM 10 MG PO TABS: 10.0000 mg | ORAL_TABLET | Freq: Every day | ORAL | 1 refills | Status: DC

## 2016-09-17 MED ORDER — TRAMADOL HCL 50 MG PO TABS: 50.0000 mg | ORAL_TABLET | Freq: Three times a day (TID) | ORAL | 0 refills | Status: DC

## 2016-09-17 MED ORDER — GABAPENTIN 100 MG PO CAPS: 100.0000 mg | ORAL_CAPSULE | Freq: Three times a day (TID) | ORAL | 1 refills | Status: DC

## 2016-09-17 NOTE — Assessment & Plan Note (Signed)
Tolerating statin, encouraged heart healthy diet, avoid trans fats, minimize simple carbs and saturated fats. Increase exercise as tolerated 

## 2016-09-17 NOTE — Patient Instructions (Signed)

## 2016-09-17 NOTE — Progress Notes (Signed)
Pre visit review using our clinic review tool, if applicable. No additional management support is needed unless otherwise documented below in the visit note. 

## 2016-09-17 NOTE — Progress Notes (Signed)
Patient ID: Katherine Owen, female   DOB: Mar 29, 1928, 83 y.o.   MRN: [id number]     Subjective:    Patient ID: Katherine Owen, female    DOB: 1928/05/26, 83 y.o.   MRN: [id number]  Chief Complaint  Patient presents with  . Follow-up    surgical follow up has surgery in january on neck.    HPI  Patient is in today for neck surgery follow up.  She just got out of the nursing home on 09/05/16.  She also needs to follow up blood pressure, allergies, and pain medications.  Has ingrown toenails in her left and right great toes and would like a referral to podiatrist.  Patient Care Team: Ann Held, DO as PCP - General (Family Medicine) Burnell Blanks, MD as Consulting Physician (Cardiology) Justice Britain, MD as Consulting Physician (Orthopedic Surgery) Manson Passey, MD as Referring Physician (Neurosurgery)   Past Medical History:  Diagnosis Date  . Allergy   . Arthritis   . Cataract    Bilateral  . Essential hypertension 05/02/2014  . Headache   . History of shingles   . HLD (hyperlipidemia) 05/02/2014  . Hyperlipidemia   . Hypertension   . Neuropathic pain 05/02/2014  . Osteoarthritis of both shoulders due to rotator cuff injury 04/23/2016  . Poor mobility 08/28/2015  . 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 Spinal stenosis of lumbar region 05/02/2014   S/p decomp[facility] at [facility] 2015     Past Surgical History:  Procedure Laterality Date  . ANKLE SURGERY Left 2014  . BACK SURGERY  2015   [place]   . BUNIONECTOMY Right   . COLONOSCOPY    . EYE SURGERY     cataract  . REVERSE SHOULDER ARTHROPLASTY Right 01/11/2016   Procedure: REVERSE SHOULDER ARTHROPLASTY;  Surgeon: Justice Britain, MD;  Location: [facility];  Service: Orthopedics;  Laterality: Right;  . SHOULDER SURGERY  01/11/2016   REVERSE SHOULDER ARTHROPLASTY on 01/11/2016  . SPINE SURGERY     done at [facility] Apr 26, 2014, lumbar decompression  . TOTAL HIP ARTHROPLASTY Bilateral 2005, 2009   [place] , [place]    Family History  Problem Relation Age of Onset  . Cancer Mother     colon  . Arthritis Mother     osteoarthritis  . Arthritis Father     osteoarthritis  . Parkinson's disease Father   . Anesthesia problems Neg Hx     Social History   Social History  . Marital status: Widowed    Spouse name: N/A  . Number of children: 3  . Years of education: N/A   Occupational History  . 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 Smoking status: Former Smoker    Packs/day: 1.00    Years: 20.00    Types: Cigarettes  . Smokeless tobacco: Never Used     Comment: quit somking in early  1970's  . Alcohol use No  . Drug use: No  . Sexual activity: Not Currently   Other Topics Concern  . Not on file   Social History Narrative  . No narrative on file    Outpatient Medications Prior to Visit  Medication Sig Dispense Refill  . atorvastatin (LIPITOR) 10 MG tablet Take 1 tablet (10 mg total) by mouth at bedtime. For high cholesterol 90 tablet 1  . celecoxib (CELEBREX) 200 MG capsule Take 1 capsule (200 mg total) by mouth daily. 100 capsule 0  . cetirizine (  ZYRTEC ALLERGY) 10 MG tablet Take 1 tablet (10 mg total) by mouth at bedtime.    . Misc. Devices [facility]) [place] wheel Chair. Use as directed 1 each 0  . Multiple Vitamins-Minerals (CENTRUM SILVER) tablet Take 1 tablet by mouth daily.    . NONFORMULARY OR COMPOUNDED ITEM Power wheelchair  Dx spinal stenosis,  Neuropathy, poor mobility 1 each 0  . senna-docusate (SENOKOT-S) 8.6-50 MG per tablet Take 2 tablets by mouth 2 (two) times daily. For constipation     . losartan-hydrochlorothiazide (HYZAAR) 100-12.5 MG tablet Take 1 tablet by mouth daily. 90 tablet 1  . montelukast (SINGULAIR) 10 MG tablet Take 1 tablet (10 mg total) by mouth at bedtime. 90 tablet 3  . traMADol (ULTRAM) 50 MG tablet Take 1 tablet (50 mg total) by mouth 3 (three) times daily. 90 tablet 0  .  Aspirin-Acetaminophen-Caffeine (EXCEDRIN EXTRA STRENGTH PO) Take 1 tablet by mouth 2 (two) times daily as needed (pain).      No facility-administered medications prior to visit.     Allergies  Allergen Reactions  . No Known Allergies     Review of Systems  Constitutional: Negative for fever and malaise/fatigue.  HENT: Negative for congestion.   Eyes: Negative for blurred vision.  Respiratory: Negative for cough and shortness of breath.   Cardiovascular: Negative for chest pain, palpitations and leg swelling.  Gastrointestinal: Negative for vomiting.  Musculoskeletal: Negative for back pain.  Skin: Negative for rash.  Neurological: Negative for loss of consciousness and headaches.       Objective:    Physical Exam  Constitutional: She is oriented to person, place, and time. She appears well-developed and well-nourished. No distress.  HENT:  Head: Normocephalic and atraumatic.  Eyes: Conjunctivae are normal.  Neck: Normal range of motion. No thyromegaly present.  Cardiovascular: Normal rate and regular rhythm.   Pulmonary/Chest: Effort normal and breath sounds normal. She has no wheezes.  Abdominal: Soft. Bowel sounds are normal. There is no tenderness.  Musculoskeletal: Normal range of motion. She exhibits no edema or deformity.  b/l ingrown toenails big toes  No infection   Neurological: She is alert and oriented to person, place, and time.  Skin: Skin is warm and dry. She is not diaphoretic.  Psychiatric: She has a normal mood and affect. Her behavior is normal. Judgment and thought content normal.    BP 130/70 (BP Location: Left Arm, Cuff Size: Normal)   Pulse 87   Temp 98.1 F (36.7 C) (Oral)   Resp 16   SpO2 94%  Wt Readings from Last 3 Encounters:  07/02/16 162 lb (73.5 kg)  05/21/16 142 lb (64.4 kg)  03/13/16 142 lb 3.2 oz (64.5 kg)   BP Readings from Last 3 Encounters:  09/17/16 130/70  07/02/16 138/78  05/21/16 138/82     Immunization History    Administered Date(s) Administered  . Influenza, High Dose Seasonal PF 04/23/2016  . Influenza,inj,Quad PF,36+ Mos 02/23/2015  . Influenza-Unspecified 05/05/2014  . PPD Test 01/14/2016  . Pneumococcal Conjugate-13 06/29/2014  . Pneumococcal Polysaccharide-23 06/29/2012  . Td 06/24/2012    Health Maintenance  Topic Date Due  . DEXA SCAN  11/17/2016  . TETANUS/TDAP  06/24/2022  . INFLUENZA VACCINE  Completed  . PNA vac Low Risk Adult  Completed    Lab Results  Component Value Date   WBC 10.4 04/23/2016   HGB 12.4 04/23/2016   HCT 37.8 04/23/2016   PLT 260.0 04/23/2016   GLUCOSE 70 04/23/2016  CHOL 178 04/23/2016   TRIG 82.0 04/23/2016   HDL 66.20 04/23/2016   LDLCALC 95 04/23/2016   ALT 20 04/23/2016   AST 29 04/23/2016   NA 140 04/23/2016   K 4.4 04/23/2016   CL 103 04/23/2016   CREATININE 0.51 04/23/2016   BUN 19 04/23/2016   CO2 29 04/23/2016   TSH 1.31 10/27/2014    Lab Results  Component Value Date   TSH 1.31 10/27/2014   Lab Results  Component Value Date   WBC 10.4 04/23/2016   HGB 12.4 04/23/2016   HCT 37.8 04/23/2016   MCV 79.9 04/23/2016   PLT 260.0 04/23/2016   Lab Results  Component Value Date   NA 140 04/23/2016   K 4.4 04/23/2016   CO2 29 04/23/2016   GLUCOSE 70 04/23/2016   BUN 19 04/23/2016   CREATININE 0.51 04/23/2016   BILITOT 0.4 04/23/2016   ALKPHOS 95 04/23/2016   AST 29 04/23/2016   ALT 20 04/23/2016   PROT 7.1 04/23/2016   ALBUMIN 4.0 04/23/2016   CALCIUM 10.1 04/23/2016   ANIONGAP 7 01/08/2016   GFR 146.91 04/23/2016   Lab Results  Component Value Date   CHOL 178 04/23/2016   Lab Results  Component Value Date   HDL 66.20 04/23/2016   Lab Results  Component Value Date   LDLCALC 95 04/23/2016   Lab Results  Component Value Date   TRIG 82.0 04/23/2016   Lab Results  Component Value Date   CHOLHDL 3 04/23/2016   No results found for: HGBA1C       Assessment & Plan:   Problem List Items Addressed This  Visit      Unprioritized   Rhinitis, allergic   Relevant Medications   montelukast (SINGULAIR) 10 MG tablet   Essential hypertension    Well controlled, no changes to meds. Encouraged heart healthy diet such as the DASH diet and exercise as tolerated.       Relevant Medications   losartan-hydrochlorothiazide (HYZAAR) 100-12.5 MG tablet   Other Relevant Orders   Comprehensive metabolic panel   HLD (hyperlipidemia) - Primary    Tolerating statin, encouraged heart healthy diet, avoid trans fats, minimize simple carbs and saturated fats. Increase exercise as tolerated      Relevant Medications   losartan-hydrochlorothiazide (HYZAAR) 100-12.5 MG tablet   Other Relevant Orders   Lipid panel    Other Visit Diagnoses    Primary osteoarthritis involving multiple joints       Relevant Medications   acetaminophen (TYLENOL) 500 MG tablet   traMADol (ULTRAM) 50 MG tablet   Ingrown left greater toenail       Relevant Orders   Ambulatory referral to [place] right greater toenail       Relevant Orders   Ambulatory referral to Podiatry      I have discontinued Ms. Bloxom's Aspirin-Acetaminophen-Caffeine (EXCEDRIN EXTRA STRENGTH PO). I have also changed her gabapentin. Additionally, I am having her maintain her senna-docusate, CENTRUM SILVER, cetirizine, Wheelchair, NONFORMULARY OR COMPOUNDED ITEM, celecoxib, atorvastatin, acetaminophen, polyethylene glycol, Melatonin, lidocaine, losartan-hydrochlorothiazide, montelukast, and traMADol.  Meds ordered this encounter  Medications  . DISCONTD: gabapentin (NEURONTIN) 100 MG capsule    Sig: Take by mouth.  Marland Kitchen acetaminophen (TYLENOL) 500 MG tablet    Sig: Take 500 mg by mouth every 8 (eight) hours as needed.  . polyethylene glycol (MIRALAX / GLYCOLAX) packet    Sig: Take 17 g by mouth daily.  . Melatonin 3 MG CAPS  Sig: Take 1 capsule by mouth at bedtime.  . lidocaine (LIDODERM) 5 %    Sig: Place 2 patches onto the skin at  bedtime. Remove & Discard patch within 12 hours or as directed by MD  . gabapentin (NEURONTIN) 100 MG capsule    Sig: Take 1 capsule (100 mg total) by mouth 3 (three) times daily.    Dispense:  270 capsule    Refill:  1  . losartan-hydrochlorothiazide (HYZAAR) 100-12.5 MG tablet    Sig: Take 1 tablet by mouth daily.    Dispense:  90 tablet    Refill:  1  . montelukast (SINGULAIR) 10 MG tablet    Sig: Take 1 tablet (10 mg total) by mouth at bedtime.    Dispense:  90 tablet    Refill:  1  . traMADol (ULTRAM) 50 MG tablet    Sig: Take 1 tablet (50 mg total) by mouth 3 (three) times daily.    Dispense:  90 tablet    Refill:  0    CMA served as scribe during this visit. History, Physical and Plan performed by medical provider. Documentation and orders reviewed and attested to.  Ann Held, DO

## 2016-09-17 NOTE — Assessment & Plan Note (Signed)
Well controlled, no changes to meds. Encouraged heart healthy diet such as the DASH diet and exercise as tolerated.  °

## 2016-09-18 DIAGNOSIS — Z96611 Presence of right artificial shoulder joint: Secondary | ICD-10-CM | POA: Diagnosis not present

## 2016-09-18 DIAGNOSIS — Z96641 Presence of right artificial hip joint: Secondary | ICD-10-CM | POA: Diagnosis not present

## 2016-09-18 DIAGNOSIS — Z4789 Encounter for other orthopedic aftercare: Secondary | ICD-10-CM | POA: Diagnosis not present

## 2016-09-18 DIAGNOSIS — I10 Essential (primary) hypertension: Secondary | ICD-10-CM | POA: Diagnosis not present

## 2016-09-18 DIAGNOSIS — E785 Hyperlipidemia, unspecified: Secondary | ICD-10-CM | POA: Diagnosis not present

## 2016-09-18 DIAGNOSIS — Z96642 Presence of left artificial hip joint: Secondary | ICD-10-CM | POA: Diagnosis not present

## 2016-09-19 DIAGNOSIS — Z4789 Encounter for other orthopedic aftercare: Secondary | ICD-10-CM | POA: Diagnosis not present

## 2016-09-19 DIAGNOSIS — Z96611 Presence of right artificial shoulder joint: Secondary | ICD-10-CM | POA: Diagnosis not present

## 2016-09-19 DIAGNOSIS — Z96642 Presence of left artificial hip joint: Secondary | ICD-10-CM | POA: Diagnosis not present

## 2016-09-19 DIAGNOSIS — I10 Essential (primary) hypertension: Secondary | ICD-10-CM | POA: Diagnosis not present

## 2016-09-19 DIAGNOSIS — E785 Hyperlipidemia, unspecified: Secondary | ICD-10-CM | POA: Diagnosis not present

## 2016-09-19 DIAGNOSIS — Z96641 Presence of right artificial hip joint: Secondary | ICD-10-CM | POA: Diagnosis not present

## 2016-09-20 DIAGNOSIS — Z96611 Presence of right artificial shoulder joint: Secondary | ICD-10-CM | POA: Diagnosis not present

## 2016-09-20 DIAGNOSIS — Z96641 Presence of right artificial hip joint: Secondary | ICD-10-CM | POA: Diagnosis not present

## 2016-09-20 DIAGNOSIS — I10 Essential (primary) hypertension: Secondary | ICD-10-CM | POA: Diagnosis not present

## 2016-09-20 DIAGNOSIS — Z96642 Presence of left artificial hip joint: Secondary | ICD-10-CM | POA: Diagnosis not present

## 2016-09-20 DIAGNOSIS — E785 Hyperlipidemia, unspecified: Secondary | ICD-10-CM | POA: Diagnosis not present

## 2016-09-20 DIAGNOSIS — Z4789 Encounter for other orthopedic aftercare: Secondary | ICD-10-CM | POA: Diagnosis not present

## 2016-09-24 ENCOUNTER — Ambulatory Visit (INDEPENDENT_AMBULATORY_CARE_PROVIDER_SITE_OTHER): Payer: Medicare Other | Admitting: Podiatry

## 2016-09-24 ENCOUNTER — Telehealth: Payer: Self-pay | Admitting: Family Medicine

## 2016-09-24 ENCOUNTER — Encounter: Payer: Self-pay | Admitting: Podiatry

## 2016-09-24 DIAGNOSIS — M150 Primary generalized (osteo)arthritis: Principal | ICD-10-CM

## 2016-09-24 DIAGNOSIS — M79675 Pain in left toe(s): Secondary | ICD-10-CM | POA: Diagnosis not present

## 2016-09-24 DIAGNOSIS — M159 Polyosteoarthritis, unspecified: Secondary | ICD-10-CM

## 2016-09-24 DIAGNOSIS — E785 Hyperlipidemia, unspecified: Secondary | ICD-10-CM | POA: Diagnosis not present

## 2016-09-24 DIAGNOSIS — L988 Other specified disorders of the skin and subcutaneous tissue: Secondary | ICD-10-CM | POA: Diagnosis not present

## 2016-09-24 DIAGNOSIS — I10 Essential (primary) hypertension: Secondary | ICD-10-CM | POA: Diagnosis not present

## 2016-09-24 DIAGNOSIS — L600 Ingrowing nail: Secondary | ICD-10-CM

## 2016-09-24 DIAGNOSIS — Z96611 Presence of right artificial shoulder joint: Secondary | ICD-10-CM | POA: Diagnosis not present

## 2016-09-24 DIAGNOSIS — Z96641 Presence of right artificial hip joint: Secondary | ICD-10-CM | POA: Diagnosis not present

## 2016-09-24 DIAGNOSIS — Z96642 Presence of left artificial hip joint: Secondary | ICD-10-CM | POA: Diagnosis not present

## 2016-09-24 DIAGNOSIS — M79674 Pain in right toe(s): Secondary | ICD-10-CM | POA: Diagnosis not present

## 2016-09-24 DIAGNOSIS — B351 Tinea unguium: Secondary | ICD-10-CM

## 2016-09-24 DIAGNOSIS — Z4789 Encounter for other orthopedic aftercare: Secondary | ICD-10-CM | POA: Diagnosis not present

## 2016-09-24 NOTE — Telephone Encounter (Signed)
Caller name: Relationship to patient: Self Can be reached: [phone]  Pharmacy:  Reason for call: Request Rx for Tramadol [facility] for a 90 day supply instead of 30 days. Also, would like a referral to an ENT because she is having problems with her hearing.

## 2016-09-24 NOTE — Progress Notes (Signed)
   Subjective:    Patient ID: Katherine Owen, female    DOB: 1928/01/07, 83 y.o.   MRN: [id number]  HPI  83 year old female presents the office today for concerns of ingrowing toenails to both of her big toes which is been intermittent for the last several years. She states the areas are painful with pressure in shoes.she denies any redness or warmth and she does note some occasional drainage from the toenails however not today.  She presents in a wheelchair with her family. She does not walk.  She has no other complaints today.  Review of Systems  All other systems reviewed and are negative.      Objective:   Physical Exam General: AAO x3, NAD  Dermatological: Nails are hypertrophic, dystrophic, brittle, discolored, elongated 10. There is incurvation of both the medial and lateral nail borders of bilateral hallux. There is tenderness to the nail borders on ingrown toenail but there is no clinical signs of infection.No surrounding redness or drainage. Tenderness nails 1-5 bilaterally. There is interdigital maceration along the right 2-4th interspace. No swelling, drainage or signs of infection. No open lesions or pre-ulcerative lesions are identified today.  Vascular:  DP 1/4, PT pulses nonpalpable. She denies any leg pain. No swelling. There is no pain with calf compression, swelling, warmth, erythema.  Neruologic: Sensation intact to light touch.  Musculoskeletal: No gross boney pedal deformities bilateral. No pain, crepitus, or limitation noted with foot and ankle range of motion bilateral.   Gait: Nonambulatory and in wheelchair.     Assessment & Plan:  83 year old female with symptomatic ingrown toenails, onychomycosis;  Interdigital maceration -Treatment options discussed including all alternatives, risks, and complications -Etiology of symptoms were discussed -Nails debrided 10 without complications or bleeding. I debrided the symptomatic portion of the ingrown toes  bilateral hallux. Given circulation was told off on any partial nail avulsion. There was no bleeding occurring to today's procedure and there is no cutting of the skin. After debridement there was resolution of symptoms castellani's paint was applied interdigitally. Dry thoroughly. -Daily foot inspection -Follow-up in 3 months or sooner if any problems arise. In the meantime, encouraged to call the office with any questions, concerns, change in symptoms.   Celesta Gentile, DPM

## 2016-09-25 DIAGNOSIS — Z96611 Presence of right artificial shoulder joint: Secondary | ICD-10-CM | POA: Diagnosis not present

## 2016-09-25 DIAGNOSIS — Z96642 Presence of left artificial hip joint: Secondary | ICD-10-CM | POA: Diagnosis not present

## 2016-09-25 DIAGNOSIS — Z96641 Presence of right artificial hip joint: Secondary | ICD-10-CM | POA: Diagnosis not present

## 2016-09-25 DIAGNOSIS — E785 Hyperlipidemia, unspecified: Secondary | ICD-10-CM | POA: Diagnosis not present

## 2016-09-25 DIAGNOSIS — Z4789 Encounter for other orthopedic aftercare: Secondary | ICD-10-CM | POA: Diagnosis not present

## 2016-09-25 DIAGNOSIS — I10 Essential (primary) hypertension: Secondary | ICD-10-CM | POA: Diagnosis not present

## 2016-09-26 DIAGNOSIS — I10 Essential (primary) hypertension: Secondary | ICD-10-CM | POA: Diagnosis not present

## 2016-09-26 DIAGNOSIS — Z96642 Presence of left artificial hip joint: Secondary | ICD-10-CM | POA: Diagnosis not present

## 2016-09-26 DIAGNOSIS — Z4789 Encounter for other orthopedic aftercare: Secondary | ICD-10-CM | POA: Diagnosis not present

## 2016-09-26 DIAGNOSIS — Z96611 Presence of right artificial shoulder joint: Secondary | ICD-10-CM | POA: Diagnosis not present

## 2016-09-26 DIAGNOSIS — E785 Hyperlipidemia, unspecified: Secondary | ICD-10-CM | POA: Diagnosis not present

## 2016-09-26 DIAGNOSIS — Z96641 Presence of right artificial hip joint: Secondary | ICD-10-CM | POA: Diagnosis not present

## 2016-09-26 NOTE — Telephone Encounter (Signed)
Ok to refer to ent Is pt taking ultram tid regularly?   Than send #270

## 2016-09-26 NOTE — Telephone Encounter (Signed)
Her last refill was on 09/07/16  #90 no refills Last office visit 08/22/16

## 2016-09-27 ENCOUNTER — Other Ambulatory Visit: Payer: Self-pay | Admitting: Family Medicine

## 2016-09-27 DIAGNOSIS — E785 Hyperlipidemia, unspecified: Secondary | ICD-10-CM | POA: Diagnosis not present

## 2016-09-27 DIAGNOSIS — Z96611 Presence of right artificial shoulder joint: Secondary | ICD-10-CM | POA: Diagnosis not present

## 2016-09-27 DIAGNOSIS — Z96642 Presence of left artificial hip joint: Secondary | ICD-10-CM | POA: Diagnosis not present

## 2016-09-27 DIAGNOSIS — H93239 Hyperacusis, unspecified ear: Secondary | ICD-10-CM

## 2016-09-27 DIAGNOSIS — Z4789 Encounter for other orthopedic aftercare: Secondary | ICD-10-CM | POA: Diagnosis not present

## 2016-09-27 DIAGNOSIS — I10 Essential (primary) hypertension: Secondary | ICD-10-CM | POA: Diagnosis not present

## 2016-09-27 DIAGNOSIS — Z96641 Presence of right artificial hip joint: Secondary | ICD-10-CM | POA: Diagnosis not present

## 2016-09-27 MED ORDER — TRAMADOL HCL 50 MG PO TABS: 50.0000 mg | ORAL_TABLET | Freq: Three times a day (TID) | ORAL | 0 refills | Status: DC

## 2016-09-27 NOTE — Telephone Encounter (Signed)
Referral entered/called the patient and "yes" she is taking tid regularly. Printed #270 and will [facility] once PCP signs prescription.

## 2016-09-30 ENCOUNTER — Telehealth: Payer: Self-pay | Admitting: Family Medicine

## 2016-09-30 DIAGNOSIS — Z96641 Presence of right artificial hip joint: Secondary | ICD-10-CM | POA: Diagnosis not present

## 2016-09-30 DIAGNOSIS — E785 Hyperlipidemia, unspecified: Secondary | ICD-10-CM | POA: Diagnosis not present

## 2016-09-30 DIAGNOSIS — Z96642 Presence of left artificial hip joint: Secondary | ICD-10-CM | POA: Diagnosis not present

## 2016-09-30 DIAGNOSIS — Z4789 Encounter for other orthopedic aftercare: Secondary | ICD-10-CM | POA: Diagnosis not present

## 2016-09-30 DIAGNOSIS — Z96611 Presence of right artificial shoulder joint: Secondary | ICD-10-CM | POA: Diagnosis not present

## 2016-09-30 DIAGNOSIS — I10 Essential (primary) hypertension: Secondary | ICD-10-CM | POA: Diagnosis not present

## 2016-09-30 NOTE — Telephone Encounter (Signed)
Caller name: [name]  Relation to pt: Son  Call back number: [phone] Pharmacy:  Reason for call: Pt's son came in office stating had received a bill from the office stating $280.00 for a CPE on 01-04-2016 (pt's son left a copy of collection bill- document given to [place]) Son states needs to resubmit bill to insurance correct as a Wellness Visit so that insurance can cover the visit. (The pt's son states it was a Wellness Visit and that he verified with insurance and it need to be corrected). Please inform son if bill being processed. (Tel mentioned above)

## 2016-10-01 DIAGNOSIS — I10 Essential (primary) hypertension: Secondary | ICD-10-CM | POA: Diagnosis not present

## 2016-10-01 DIAGNOSIS — Z4789 Encounter for other orthopedic aftercare: Secondary | ICD-10-CM | POA: Diagnosis not present

## 2016-10-01 DIAGNOSIS — Z96641 Presence of right artificial hip joint: Secondary | ICD-10-CM | POA: Diagnosis not present

## 2016-10-01 DIAGNOSIS — E785 Hyperlipidemia, unspecified: Secondary | ICD-10-CM | POA: Diagnosis not present

## 2016-10-01 DIAGNOSIS — Z96611 Presence of right artificial shoulder joint: Secondary | ICD-10-CM | POA: Diagnosis not present

## 2016-10-01 DIAGNOSIS — Z96642 Presence of left artificial hip joint: Secondary | ICD-10-CM | POA: Diagnosis not present

## 2016-10-01 NOTE — Telephone Encounter (Signed)
Patient would like to discuss traMADol (ULTRAM) 50 MG tablet refill please call [phone]

## 2016-10-01 NOTE — Telephone Encounter (Signed)
Spoke to the patient and did confirm we sent in #270 on 09/27/16 (I still have the hardcopy in folder) So have called [facility] to clarify why they have only filled #30 days.  The pharmacy was told verbally #270 was sent in for a 3 month refill with no refills. I was told they would correct this and then called the patient informed her taken care of.

## 2016-10-02 DIAGNOSIS — E785 Hyperlipidemia, unspecified: Secondary | ICD-10-CM | POA: Diagnosis not present

## 2016-10-02 DIAGNOSIS — I10 Essential (primary) hypertension: Secondary | ICD-10-CM | POA: Diagnosis not present

## 2016-10-02 DIAGNOSIS — Z4789 Encounter for other orthopedic aftercare: Secondary | ICD-10-CM | POA: Diagnosis not present

## 2016-10-02 DIAGNOSIS — Z96641 Presence of right artificial hip joint: Secondary | ICD-10-CM | POA: Diagnosis not present

## 2016-10-02 DIAGNOSIS — Z96611 Presence of right artificial shoulder joint: Secondary | ICD-10-CM | POA: Diagnosis not present

## 2016-10-02 DIAGNOSIS — Z96642 Presence of left artificial hip joint: Secondary | ICD-10-CM | POA: Diagnosis not present

## 2016-10-03 DIAGNOSIS — Z96611 Presence of right artificial shoulder joint: Secondary | ICD-10-CM | POA: Diagnosis not present

## 2016-10-03 DIAGNOSIS — Z96642 Presence of left artificial hip joint: Secondary | ICD-10-CM | POA: Diagnosis not present

## 2016-10-03 DIAGNOSIS — Z96641 Presence of right artificial hip joint: Secondary | ICD-10-CM | POA: Diagnosis not present

## 2016-10-03 DIAGNOSIS — Z4789 Encounter for other orthopedic aftercare: Secondary | ICD-10-CM | POA: Diagnosis not present

## 2016-10-03 DIAGNOSIS — I10 Essential (primary) hypertension: Secondary | ICD-10-CM | POA: Diagnosis not present

## 2016-10-03 DIAGNOSIS — E785 Hyperlipidemia, unspecified: Secondary | ICD-10-CM | POA: Diagnosis not present

## 2016-10-03 NOTE — Telephone Encounter (Signed)
Do you know who I contact to get it out of collections?  Thanks [employer]

## 2016-10-03 NOTE — Telephone Encounter (Signed)
I sent to charge correction to have adjusted off , CC'd you on email. Thanks, [employer]

## 2016-10-03 NOTE — Telephone Encounter (Signed)
Can you look at the [facility] in question to see if we can bill for an office visit since patient has traditional medicare?

## 2016-10-04 NOTE — Telephone Encounter (Signed)
Spoke with patient and let her know corrections have been made to this account.

## 2016-10-04 NOTE — Telephone Encounter (Signed)
I asked CC to remove from collections in email.  I don't know of anyone else. Sorry. Dawn

## 2016-10-07 DIAGNOSIS — Z96611 Presence of right artificial shoulder joint: Secondary | ICD-10-CM | POA: Diagnosis not present

## 2016-10-07 DIAGNOSIS — I10 Essential (primary) hypertension: Secondary | ICD-10-CM | POA: Diagnosis not present

## 2016-10-07 DIAGNOSIS — Z4789 Encounter for other orthopedic aftercare: Secondary | ICD-10-CM | POA: Diagnosis not present

## 2016-10-07 DIAGNOSIS — Z96641 Presence of right artificial hip joint: Secondary | ICD-10-CM | POA: Diagnosis not present

## 2016-10-07 DIAGNOSIS — E785 Hyperlipidemia, unspecified: Secondary | ICD-10-CM | POA: Diagnosis not present

## 2016-10-07 DIAGNOSIS — Z96642 Presence of left artificial hip joint: Secondary | ICD-10-CM | POA: Diagnosis not present

## 2016-10-08 DIAGNOSIS — I10 Essential (primary) hypertension: Secondary | ICD-10-CM | POA: Diagnosis not present

## 2016-10-08 DIAGNOSIS — Z96641 Presence of right artificial hip joint: Secondary | ICD-10-CM | POA: Diagnosis not present

## 2016-10-08 DIAGNOSIS — Z96611 Presence of right artificial shoulder joint: Secondary | ICD-10-CM | POA: Diagnosis not present

## 2016-10-08 DIAGNOSIS — Z96642 Presence of left artificial hip joint: Secondary | ICD-10-CM | POA: Diagnosis not present

## 2016-10-08 DIAGNOSIS — E785 Hyperlipidemia, unspecified: Secondary | ICD-10-CM | POA: Diagnosis not present

## 2016-10-08 DIAGNOSIS — Z4789 Encounter for other orthopedic aftercare: Secondary | ICD-10-CM | POA: Diagnosis not present

## 2016-10-09 DIAGNOSIS — Z4789 Encounter for other orthopedic aftercare: Secondary | ICD-10-CM | POA: Diagnosis not present

## 2016-10-09 DIAGNOSIS — Z96611 Presence of right artificial shoulder joint: Secondary | ICD-10-CM | POA: Diagnosis not present

## 2016-10-09 DIAGNOSIS — E785 Hyperlipidemia, unspecified: Secondary | ICD-10-CM | POA: Diagnosis not present

## 2016-10-09 DIAGNOSIS — Z96642 Presence of left artificial hip joint: Secondary | ICD-10-CM | POA: Diagnosis not present

## 2016-10-09 DIAGNOSIS — I10 Essential (primary) hypertension: Secondary | ICD-10-CM | POA: Diagnosis not present

## 2016-10-09 DIAGNOSIS — Z96641 Presence of right artificial hip joint: Secondary | ICD-10-CM | POA: Diagnosis not present

## 2016-10-10 DIAGNOSIS — E785 Hyperlipidemia, unspecified: Secondary | ICD-10-CM | POA: Diagnosis not present

## 2016-10-10 DIAGNOSIS — I10 Essential (primary) hypertension: Secondary | ICD-10-CM | POA: Diagnosis not present

## 2016-10-10 DIAGNOSIS — Z96642 Presence of left artificial hip joint: Secondary | ICD-10-CM | POA: Diagnosis not present

## 2016-10-10 DIAGNOSIS — Z96641 Presence of right artificial hip joint: Secondary | ICD-10-CM | POA: Diagnosis not present

## 2016-10-10 DIAGNOSIS — Z4789 Encounter for other orthopedic aftercare: Secondary | ICD-10-CM | POA: Diagnosis not present

## 2016-10-10 DIAGNOSIS — Z96611 Presence of right artificial shoulder joint: Secondary | ICD-10-CM | POA: Diagnosis not present

## 2016-10-11 ENCOUNTER — Telehealth: Payer: Self-pay | Admitting: Family Medicine

## 2016-10-11 NOTE — Telephone Encounter (Signed)
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 They have been doing PT for this and want to extend order 2 times a week for 4 more weeks. Verbal auth given.

## 2016-10-11 NOTE — Telephone Encounter (Signed)
Caller name:Allen with [facility] Relationship to patient: Can be [phone] Pharmacy:  Reason for call:needing verbal orders to extend home PT.

## 2016-10-14 DIAGNOSIS — Z96611 Presence of right artificial shoulder joint: Secondary | ICD-10-CM | POA: Diagnosis not present

## 2016-10-14 DIAGNOSIS — I10 Essential (primary) hypertension: Secondary | ICD-10-CM | POA: Diagnosis not present

## 2016-10-14 DIAGNOSIS — E785 Hyperlipidemia, unspecified: Secondary | ICD-10-CM | POA: Diagnosis not present

## 2016-10-14 DIAGNOSIS — H93239 Hyperacusis, unspecified ear: Secondary | ICD-10-CM | POA: Diagnosis not present

## 2016-10-14 DIAGNOSIS — Z96642 Presence of left artificial hip joint: Secondary | ICD-10-CM | POA: Diagnosis not present

## 2016-10-14 DIAGNOSIS — Z4789 Encounter for other orthopedic aftercare: Secondary | ICD-10-CM | POA: Diagnosis not present

## 2016-10-14 DIAGNOSIS — Z96641 Presence of right artificial hip joint: Secondary | ICD-10-CM | POA: Diagnosis not present

## 2016-10-14 DIAGNOSIS — H6123 Impacted cerumen, bilateral: Secondary | ICD-10-CM | POA: Diagnosis not present

## 2016-10-15 DIAGNOSIS — Z96642 Presence of left artificial hip joint: Secondary | ICD-10-CM | POA: Diagnosis not present

## 2016-10-15 DIAGNOSIS — I10 Essential (primary) hypertension: Secondary | ICD-10-CM | POA: Diagnosis not present

## 2016-10-15 DIAGNOSIS — Z4789 Encounter for other orthopedic aftercare: Secondary | ICD-10-CM | POA: Diagnosis not present

## 2016-10-15 DIAGNOSIS — Z96611 Presence of right artificial shoulder joint: Secondary | ICD-10-CM | POA: Diagnosis not present

## 2016-10-15 DIAGNOSIS — Z96641 Presence of right artificial hip joint: Secondary | ICD-10-CM | POA: Diagnosis not present

## 2016-10-15 DIAGNOSIS — E785 Hyperlipidemia, unspecified: Secondary | ICD-10-CM | POA: Diagnosis not present

## 2016-10-16 DIAGNOSIS — M4312 Spondylolisthesis, cervical region: Secondary | ICD-10-CM | POA: Diagnosis not present

## 2016-10-16 DIAGNOSIS — M5002 Cervical disc disorder with myelopathy, mid-cervical region, unspecified level: Secondary | ICD-10-CM | POA: Diagnosis not present

## 2016-10-16 DIAGNOSIS — M4722 Other spondylosis with radiculopathy, cervical region: Secondary | ICD-10-CM | POA: Diagnosis not present

## 2016-10-16 DIAGNOSIS — M542 Cervicalgia: Secondary | ICD-10-CM | POA: Diagnosis not present

## 2016-10-16 DIAGNOSIS — Z981 Arthrodesis status: Secondary | ICD-10-CM | POA: Diagnosis not present

## 2016-10-16 DIAGNOSIS — M5000 Cervical disc disorder with myelopathy, unspecified cervical region: Secondary | ICD-10-CM | POA: Diagnosis not present

## 2016-10-17 DIAGNOSIS — Z96611 Presence of right artificial shoulder joint: Secondary | ICD-10-CM | POA: Diagnosis not present

## 2016-10-17 DIAGNOSIS — Z96642 Presence of left artificial hip joint: Secondary | ICD-10-CM | POA: Diagnosis not present

## 2016-10-17 DIAGNOSIS — I10 Essential (primary) hypertension: Secondary | ICD-10-CM | POA: Diagnosis not present

## 2016-10-17 DIAGNOSIS — E785 Hyperlipidemia, unspecified: Secondary | ICD-10-CM | POA: Diagnosis not present

## 2016-10-17 DIAGNOSIS — Z4789 Encounter for other orthopedic aftercare: Secondary | ICD-10-CM | POA: Diagnosis not present

## 2016-10-17 DIAGNOSIS — Z96641 Presence of right artificial hip joint: Secondary | ICD-10-CM | POA: Diagnosis not present

## 2016-10-18 DIAGNOSIS — I10 Essential (primary) hypertension: Secondary | ICD-10-CM | POA: Diagnosis not present

## 2016-10-18 DIAGNOSIS — Z96611 Presence of right artificial shoulder joint: Secondary | ICD-10-CM | POA: Diagnosis not present

## 2016-10-18 DIAGNOSIS — E785 Hyperlipidemia, unspecified: Secondary | ICD-10-CM | POA: Diagnosis not present

## 2016-10-18 DIAGNOSIS — Z4789 Encounter for other orthopedic aftercare: Secondary | ICD-10-CM | POA: Diagnosis not present

## 2016-10-18 DIAGNOSIS — Z96642 Presence of left artificial hip joint: Secondary | ICD-10-CM | POA: Diagnosis not present

## 2016-10-18 DIAGNOSIS — Z96641 Presence of right artificial hip joint: Secondary | ICD-10-CM | POA: Diagnosis not present

## 2016-10-21 DIAGNOSIS — I10 Essential (primary) hypertension: Secondary | ICD-10-CM | POA: Diagnosis not present

## 2016-10-21 DIAGNOSIS — Z96642 Presence of left artificial hip joint: Secondary | ICD-10-CM | POA: Diagnosis not present

## 2016-10-21 DIAGNOSIS — Z96641 Presence of right artificial hip joint: Secondary | ICD-10-CM | POA: Diagnosis not present

## 2016-10-21 DIAGNOSIS — Z96611 Presence of right artificial shoulder joint: Secondary | ICD-10-CM | POA: Diagnosis not present

## 2016-10-21 DIAGNOSIS — E785 Hyperlipidemia, unspecified: Secondary | ICD-10-CM | POA: Diagnosis not present

## 2016-10-21 DIAGNOSIS — Z4789 Encounter for other orthopedic aftercare: Secondary | ICD-10-CM | POA: Diagnosis not present

## 2016-10-22 DIAGNOSIS — I10 Essential (primary) hypertension: Secondary | ICD-10-CM | POA: Diagnosis not present

## 2016-10-22 DIAGNOSIS — Z96611 Presence of right artificial shoulder joint: Secondary | ICD-10-CM | POA: Diagnosis not present

## 2016-10-22 DIAGNOSIS — Z96641 Presence of right artificial hip joint: Secondary | ICD-10-CM | POA: Diagnosis not present

## 2016-10-22 DIAGNOSIS — E785 Hyperlipidemia, unspecified: Secondary | ICD-10-CM | POA: Diagnosis not present

## 2016-10-22 DIAGNOSIS — Z4789 Encounter for other orthopedic aftercare: Secondary | ICD-10-CM | POA: Diagnosis not present

## 2016-10-22 DIAGNOSIS — Z96642 Presence of left artificial hip joint: Secondary | ICD-10-CM | POA: Diagnosis not present

## 2016-10-23 ENCOUNTER — Telehealth: Payer: Self-pay | Admitting: Family Medicine

## 2016-10-23 DIAGNOSIS — E785 Hyperlipidemia, unspecified: Secondary | ICD-10-CM | POA: Diagnosis not present

## 2016-10-23 DIAGNOSIS — Z4789 Encounter for other orthopedic aftercare: Secondary | ICD-10-CM | POA: Diagnosis not present

## 2016-10-23 DIAGNOSIS — I10 Essential (primary) hypertension: Secondary | ICD-10-CM | POA: Diagnosis not present

## 2016-10-23 DIAGNOSIS — Z96611 Presence of right artificial shoulder joint: Secondary | ICD-10-CM | POA: Diagnosis not present

## 2016-10-23 DIAGNOSIS — Z96641 Presence of right artificial hip joint: Secondary | ICD-10-CM | POA: Diagnosis not present

## 2016-10-23 DIAGNOSIS — Z96642 Presence of left artificial hip joint: Secondary | ICD-10-CM | POA: Diagnosis not present

## 2016-10-23 NOTE — Telephone Encounter (Signed)
Caller name: [name]  Relation to pt: OT from [facility]  Call back number: [phone]  Pharmacy:  Reason for call:  Requesting verbal orders for 2x 2 weeks and verbal orders for electrical stim right thigh, please advise

## 2016-10-24 DIAGNOSIS — Z4789 Encounter for other orthopedic aftercare: Secondary | ICD-10-CM | POA: Diagnosis not present

## 2016-10-24 DIAGNOSIS — E785 Hyperlipidemia, unspecified: Secondary | ICD-10-CM | POA: Diagnosis not present

## 2016-10-24 DIAGNOSIS — Z96611 Presence of right artificial shoulder joint: Secondary | ICD-10-CM | POA: Diagnosis not present

## 2016-10-24 DIAGNOSIS — I10 Essential (primary) hypertension: Secondary | ICD-10-CM | POA: Diagnosis not present

## 2016-10-24 DIAGNOSIS — Z96641 Presence of right artificial hip joint: Secondary | ICD-10-CM | POA: Diagnosis not present

## 2016-10-24 DIAGNOSIS — Z96642 Presence of left artificial hip joint: Secondary | ICD-10-CM | POA: Diagnosis not present

## 2016-10-24 NOTE — Telephone Encounter (Signed)
[name] with [facility] informed, understood & agreed/SLS 05/03

## 2016-10-24 NOTE — Telephone Encounter (Signed)
ok 

## 2016-10-28 DIAGNOSIS — Z96611 Presence of right artificial shoulder joint: Secondary | ICD-10-CM | POA: Diagnosis not present

## 2016-10-28 DIAGNOSIS — I10 Essential (primary) hypertension: Secondary | ICD-10-CM | POA: Diagnosis not present

## 2016-10-28 DIAGNOSIS — Z4789 Encounter for other orthopedic aftercare: Secondary | ICD-10-CM | POA: Diagnosis not present

## 2016-10-28 DIAGNOSIS — Z96642 Presence of left artificial hip joint: Secondary | ICD-10-CM | POA: Diagnosis not present

## 2016-10-28 DIAGNOSIS — Z96641 Presence of right artificial hip joint: Secondary | ICD-10-CM | POA: Diagnosis not present

## 2016-10-28 DIAGNOSIS — E785 Hyperlipidemia, unspecified: Secondary | ICD-10-CM | POA: Diagnosis not present

## 2016-10-29 DIAGNOSIS — Z96611 Presence of right artificial shoulder joint: Secondary | ICD-10-CM | POA: Diagnosis not present

## 2016-10-29 DIAGNOSIS — Z4789 Encounter for other orthopedic aftercare: Secondary | ICD-10-CM | POA: Diagnosis not present

## 2016-10-29 DIAGNOSIS — E785 Hyperlipidemia, unspecified: Secondary | ICD-10-CM | POA: Diagnosis not present

## 2016-10-29 DIAGNOSIS — Z96642 Presence of left artificial hip joint: Secondary | ICD-10-CM | POA: Diagnosis not present

## 2016-10-29 DIAGNOSIS — I10 Essential (primary) hypertension: Secondary | ICD-10-CM | POA: Diagnosis not present

## 2016-10-29 DIAGNOSIS — Z96641 Presence of right artificial hip joint: Secondary | ICD-10-CM | POA: Diagnosis not present

## 2016-10-30 DIAGNOSIS — Z96611 Presence of right artificial shoulder joint: Secondary | ICD-10-CM | POA: Diagnosis not present

## 2016-10-30 DIAGNOSIS — I10 Essential (primary) hypertension: Secondary | ICD-10-CM | POA: Diagnosis not present

## 2016-10-30 DIAGNOSIS — Z96641 Presence of right artificial hip joint: Secondary | ICD-10-CM | POA: Diagnosis not present

## 2016-10-30 DIAGNOSIS — E785 Hyperlipidemia, unspecified: Secondary | ICD-10-CM | POA: Diagnosis not present

## 2016-10-30 DIAGNOSIS — Z96642 Presence of left artificial hip joint: Secondary | ICD-10-CM | POA: Diagnosis not present

## 2016-10-30 DIAGNOSIS — Z4789 Encounter for other orthopedic aftercare: Secondary | ICD-10-CM | POA: Diagnosis not present

## 2016-10-31 DIAGNOSIS — Z96642 Presence of left artificial hip joint: Secondary | ICD-10-CM | POA: Diagnosis not present

## 2016-10-31 DIAGNOSIS — Z4789 Encounter for other orthopedic aftercare: Secondary | ICD-10-CM | POA: Diagnosis not present

## 2016-10-31 DIAGNOSIS — Z96641 Presence of right artificial hip joint: Secondary | ICD-10-CM | POA: Diagnosis not present

## 2016-10-31 DIAGNOSIS — E785 Hyperlipidemia, unspecified: Secondary | ICD-10-CM | POA: Diagnosis not present

## 2016-10-31 DIAGNOSIS — Z96611 Presence of right artificial shoulder joint: Secondary | ICD-10-CM | POA: Diagnosis not present

## 2016-10-31 DIAGNOSIS — I10 Essential (primary) hypertension: Secondary | ICD-10-CM | POA: Diagnosis not present

## 2016-11-04 DIAGNOSIS — Z4789 Encounter for other orthopedic aftercare: Secondary | ICD-10-CM | POA: Diagnosis not present

## 2016-11-04 DIAGNOSIS — Z96641 Presence of right artificial hip joint: Secondary | ICD-10-CM | POA: Diagnosis not present

## 2016-11-04 DIAGNOSIS — I10 Essential (primary) hypertension: Secondary | ICD-10-CM | POA: Diagnosis not present

## 2016-11-04 DIAGNOSIS — Z96611 Presence of right artificial shoulder joint: Secondary | ICD-10-CM | POA: Diagnosis not present

## 2016-11-04 DIAGNOSIS — Z96642 Presence of left artificial hip joint: Secondary | ICD-10-CM | POA: Diagnosis not present

## 2016-11-04 DIAGNOSIS — E785 Hyperlipidemia, unspecified: Secondary | ICD-10-CM | POA: Diagnosis not present

## 2016-11-05 DIAGNOSIS — Z4789 Encounter for other orthopedic aftercare: Secondary | ICD-10-CM | POA: Diagnosis not present

## 2016-11-05 DIAGNOSIS — I10 Essential (primary) hypertension: Secondary | ICD-10-CM | POA: Diagnosis not present

## 2016-11-05 DIAGNOSIS — E785 Hyperlipidemia, unspecified: Secondary | ICD-10-CM | POA: Diagnosis not present

## 2016-11-05 DIAGNOSIS — Z96611 Presence of right artificial shoulder joint: Secondary | ICD-10-CM | POA: Diagnosis not present

## 2016-11-05 DIAGNOSIS — Z96642 Presence of left artificial hip joint: Secondary | ICD-10-CM | POA: Diagnosis not present

## 2016-11-05 DIAGNOSIS — Z96641 Presence of right artificial hip joint: Secondary | ICD-10-CM | POA: Diagnosis not present

## 2016-11-06 DIAGNOSIS — Z4789 Encounter for other orthopedic aftercare: Secondary | ICD-10-CM | POA: Diagnosis not present

## 2016-11-06 DIAGNOSIS — E785 Hyperlipidemia, unspecified: Secondary | ICD-10-CM | POA: Diagnosis not present

## 2016-11-06 DIAGNOSIS — Z96642 Presence of left artificial hip joint: Secondary | ICD-10-CM | POA: Diagnosis not present

## 2016-11-06 DIAGNOSIS — I10 Essential (primary) hypertension: Secondary | ICD-10-CM | POA: Diagnosis not present

## 2016-11-06 DIAGNOSIS — Z96641 Presence of right artificial hip joint: Secondary | ICD-10-CM | POA: Diagnosis not present

## 2016-11-06 DIAGNOSIS — Z96611 Presence of right artificial shoulder joint: Secondary | ICD-10-CM | POA: Diagnosis not present

## 2016-11-07 DIAGNOSIS — E785 Hyperlipidemia, unspecified: Secondary | ICD-10-CM | POA: Diagnosis not present

## 2016-11-07 DIAGNOSIS — I10 Essential (primary) hypertension: Secondary | ICD-10-CM | POA: Diagnosis not present

## 2016-11-07 DIAGNOSIS — Z96641 Presence of right artificial hip joint: Secondary | ICD-10-CM | POA: Diagnosis not present

## 2016-11-07 DIAGNOSIS — Z4789 Encounter for other orthopedic aftercare: Secondary | ICD-10-CM | POA: Diagnosis not present

## 2016-11-07 DIAGNOSIS — Z96642 Presence of left artificial hip joint: Secondary | ICD-10-CM | POA: Diagnosis not present

## 2016-11-07 DIAGNOSIS — Z96611 Presence of right artificial shoulder joint: Secondary | ICD-10-CM | POA: Diagnosis not present

## 2016-11-12 DIAGNOSIS — E785 Hyperlipidemia, unspecified: Secondary | ICD-10-CM | POA: Diagnosis not present

## 2016-11-12 DIAGNOSIS — Z96611 Presence of right artificial shoulder joint: Secondary | ICD-10-CM | POA: Diagnosis not present

## 2016-11-12 DIAGNOSIS — Z96641 Presence of right artificial hip joint: Secondary | ICD-10-CM | POA: Diagnosis not present

## 2016-11-12 DIAGNOSIS — Z4789 Encounter for other orthopedic aftercare: Secondary | ICD-10-CM | POA: Diagnosis not present

## 2016-11-12 DIAGNOSIS — I10 Essential (primary) hypertension: Secondary | ICD-10-CM | POA: Diagnosis not present

## 2016-11-12 DIAGNOSIS — Z96642 Presence of left artificial hip joint: Secondary | ICD-10-CM | POA: Diagnosis not present

## 2016-11-14 DIAGNOSIS — Z96642 Presence of left artificial hip joint: Secondary | ICD-10-CM | POA: Diagnosis not present

## 2016-11-14 DIAGNOSIS — Z96641 Presence of right artificial hip joint: Secondary | ICD-10-CM | POA: Diagnosis not present

## 2016-11-14 DIAGNOSIS — Z96611 Presence of right artificial shoulder joint: Secondary | ICD-10-CM | POA: Diagnosis not present

## 2016-11-14 DIAGNOSIS — E785 Hyperlipidemia, unspecified: Secondary | ICD-10-CM | POA: Diagnosis not present

## 2016-11-14 DIAGNOSIS — Z4789 Encounter for other orthopedic aftercare: Secondary | ICD-10-CM | POA: Diagnosis not present

## 2016-11-14 DIAGNOSIS — I10 Essential (primary) hypertension: Secondary | ICD-10-CM | POA: Diagnosis not present

## 2016-11-19 DIAGNOSIS — I10 Essential (primary) hypertension: Secondary | ICD-10-CM | POA: Diagnosis not present

## 2016-11-19 DIAGNOSIS — E785 Hyperlipidemia, unspecified: Secondary | ICD-10-CM | POA: Diagnosis not present

## 2016-11-19 DIAGNOSIS — Z96641 Presence of right artificial hip joint: Secondary | ICD-10-CM | POA: Diagnosis not present

## 2016-11-19 DIAGNOSIS — Z96642 Presence of left artificial hip joint: Secondary | ICD-10-CM | POA: Diagnosis not present

## 2016-11-19 DIAGNOSIS — Z96611 Presence of right artificial shoulder joint: Secondary | ICD-10-CM | POA: Diagnosis not present

## 2016-11-19 DIAGNOSIS — Z4789 Encounter for other orthopedic aftercare: Secondary | ICD-10-CM | POA: Diagnosis not present

## 2016-11-20 DIAGNOSIS — G629 Polyneuropathy, unspecified: Secondary | ICD-10-CM | POA: Diagnosis not present

## 2016-11-21 ENCOUNTER — Telehealth: Payer: Self-pay | Admitting: Family Medicine

## 2016-11-21 DIAGNOSIS — I10 Essential (primary) hypertension: Secondary | ICD-10-CM | POA: Diagnosis not present

## 2016-11-21 DIAGNOSIS — Z96641 Presence of right artificial hip joint: Secondary | ICD-10-CM | POA: Diagnosis not present

## 2016-11-21 DIAGNOSIS — Z4789 Encounter for other orthopedic aftercare: Secondary | ICD-10-CM | POA: Diagnosis not present

## 2016-11-21 DIAGNOSIS — E785 Hyperlipidemia, unspecified: Secondary | ICD-10-CM | POA: Diagnosis not present

## 2016-11-21 DIAGNOSIS — Z96642 Presence of left artificial hip joint: Secondary | ICD-10-CM | POA: Diagnosis not present

## 2016-11-21 DIAGNOSIS — Z96611 Presence of right artificial shoulder joint: Secondary | ICD-10-CM | POA: Diagnosis not present

## 2016-11-21 NOTE — Telephone Encounter (Signed)
Caller name: Relationship to patient: Self Can be reached: [phone]  Pharmacy:  [place], [facility] [phone] (Phone) [phone] (Fax)     Reason for call: Refill gabapentin (NEURONTIN) 100 MG capsule [[id number]]   ALSO: Request to be referred to a Dentist. States she does not need a referral but has no clue what dentist to call

## 2016-11-21 NOTE — Telephone Encounter (Signed)
Refill x1  3 refill I don't know which dentists take medicare if any--- she may want to call her ins and see if any are on it

## 2016-11-22 MED ORDER — GABAPENTIN 100 MG PO CAPS: 100.0000 mg | ORAL_CAPSULE | Freq: Three times a day (TID) | ORAL | 3 refills | Status: DC

## 2016-11-22 NOTE — Telephone Encounter (Signed)
Sent in gabapentin as instructed Patient informed script sent in and informed of instructions regarding dentist.

## 2016-11-26 DIAGNOSIS — I10 Essential (primary) hypertension: Secondary | ICD-10-CM | POA: Diagnosis not present

## 2016-11-26 DIAGNOSIS — Z96642 Presence of left artificial hip joint: Secondary | ICD-10-CM | POA: Diagnosis not present

## 2016-11-26 DIAGNOSIS — Z96641 Presence of right artificial hip joint: Secondary | ICD-10-CM | POA: Diagnosis not present

## 2016-11-26 DIAGNOSIS — Z96611 Presence of right artificial shoulder joint: Secondary | ICD-10-CM | POA: Diagnosis not present

## 2016-11-26 DIAGNOSIS — Z4789 Encounter for other orthopedic aftercare: Secondary | ICD-10-CM | POA: Diagnosis not present

## 2016-11-26 DIAGNOSIS — E785 Hyperlipidemia, unspecified: Secondary | ICD-10-CM | POA: Diagnosis not present

## 2016-11-28 DIAGNOSIS — Z96611 Presence of right artificial shoulder joint: Secondary | ICD-10-CM | POA: Diagnosis not present

## 2016-11-28 DIAGNOSIS — E785 Hyperlipidemia, unspecified: Secondary | ICD-10-CM | POA: Diagnosis not present

## 2016-11-28 DIAGNOSIS — I10 Essential (primary) hypertension: Secondary | ICD-10-CM | POA: Diagnosis not present

## 2016-11-28 DIAGNOSIS — Z4789 Encounter for other orthopedic aftercare: Secondary | ICD-10-CM | POA: Diagnosis not present

## 2016-11-28 DIAGNOSIS — Z96641 Presence of right artificial hip joint: Secondary | ICD-10-CM | POA: Diagnosis not present

## 2016-11-28 DIAGNOSIS — Z96642 Presence of left artificial hip joint: Secondary | ICD-10-CM | POA: Diagnosis not present

## 2016-12-03 DIAGNOSIS — I10 Essential (primary) hypertension: Secondary | ICD-10-CM | POA: Diagnosis not present

## 2016-12-03 DIAGNOSIS — Z96642 Presence of left artificial hip joint: Secondary | ICD-10-CM | POA: Diagnosis not present

## 2016-12-03 DIAGNOSIS — E785 Hyperlipidemia, unspecified: Secondary | ICD-10-CM | POA: Diagnosis not present

## 2016-12-03 DIAGNOSIS — Z96611 Presence of right artificial shoulder joint: Secondary | ICD-10-CM | POA: Diagnosis not present

## 2016-12-03 DIAGNOSIS — Z4789 Encounter for other orthopedic aftercare: Secondary | ICD-10-CM | POA: Diagnosis not present

## 2016-12-03 DIAGNOSIS — Z96641 Presence of right artificial hip joint: Secondary | ICD-10-CM | POA: Diagnosis not present

## 2016-12-05 DIAGNOSIS — Z96642 Presence of left artificial hip joint: Secondary | ICD-10-CM | POA: Diagnosis not present

## 2016-12-05 DIAGNOSIS — E785 Hyperlipidemia, unspecified: Secondary | ICD-10-CM | POA: Diagnosis not present

## 2016-12-05 DIAGNOSIS — Z4789 Encounter for other orthopedic aftercare: Secondary | ICD-10-CM | POA: Diagnosis not present

## 2016-12-05 DIAGNOSIS — I10 Essential (primary) hypertension: Secondary | ICD-10-CM | POA: Diagnosis not present

## 2016-12-05 DIAGNOSIS — Z96611 Presence of right artificial shoulder joint: Secondary | ICD-10-CM | POA: Diagnosis not present

## 2016-12-05 DIAGNOSIS — Z96641 Presence of right artificial hip joint: Secondary | ICD-10-CM | POA: Diagnosis not present

## 2016-12-10 DIAGNOSIS — E785 Hyperlipidemia, unspecified: Secondary | ICD-10-CM | POA: Diagnosis not present

## 2016-12-10 DIAGNOSIS — I10 Essential (primary) hypertension: Secondary | ICD-10-CM | POA: Diagnosis not present

## 2016-12-10 DIAGNOSIS — Z96642 Presence of left artificial hip joint: Secondary | ICD-10-CM | POA: Diagnosis not present

## 2016-12-10 DIAGNOSIS — Z96641 Presence of right artificial hip joint: Secondary | ICD-10-CM | POA: Diagnosis not present

## 2016-12-10 DIAGNOSIS — Z4789 Encounter for other orthopedic aftercare: Secondary | ICD-10-CM | POA: Diagnosis not present

## 2016-12-10 DIAGNOSIS — Z96611 Presence of right artificial shoulder joint: Secondary | ICD-10-CM | POA: Diagnosis not present

## 2016-12-12 DIAGNOSIS — Z96611 Presence of right artificial shoulder joint: Secondary | ICD-10-CM | POA: Diagnosis not present

## 2016-12-12 DIAGNOSIS — Z96642 Presence of left artificial hip joint: Secondary | ICD-10-CM | POA: Diagnosis not present

## 2016-12-12 DIAGNOSIS — I10 Essential (primary) hypertension: Secondary | ICD-10-CM | POA: Diagnosis not present

## 2016-12-12 DIAGNOSIS — Z4789 Encounter for other orthopedic aftercare: Secondary | ICD-10-CM | POA: Diagnosis not present

## 2016-12-12 DIAGNOSIS — E785 Hyperlipidemia, unspecified: Secondary | ICD-10-CM | POA: Diagnosis not present

## 2016-12-12 DIAGNOSIS — Z96641 Presence of right artificial hip joint: Secondary | ICD-10-CM | POA: Diagnosis not present

## 2016-12-17 DIAGNOSIS — Z96641 Presence of right artificial hip joint: Secondary | ICD-10-CM | POA: Diagnosis not present

## 2016-12-17 DIAGNOSIS — Z4789 Encounter for other orthopedic aftercare: Secondary | ICD-10-CM | POA: Diagnosis not present

## 2016-12-17 DIAGNOSIS — E785 Hyperlipidemia, unspecified: Secondary | ICD-10-CM | POA: Diagnosis not present

## 2016-12-17 DIAGNOSIS — Z96611 Presence of right artificial shoulder joint: Secondary | ICD-10-CM | POA: Diagnosis not present

## 2016-12-17 DIAGNOSIS — I10 Essential (primary) hypertension: Secondary | ICD-10-CM | POA: Diagnosis not present

## 2016-12-17 DIAGNOSIS — Z96642 Presence of left artificial hip joint: Secondary | ICD-10-CM | POA: Diagnosis not present

## 2016-12-19 DIAGNOSIS — Z96641 Presence of right artificial hip joint: Secondary | ICD-10-CM | POA: Diagnosis not present

## 2016-12-19 DIAGNOSIS — E785 Hyperlipidemia, unspecified: Secondary | ICD-10-CM | POA: Diagnosis not present

## 2016-12-19 DIAGNOSIS — Z96611 Presence of right artificial shoulder joint: Secondary | ICD-10-CM | POA: Diagnosis not present

## 2016-12-19 DIAGNOSIS — I10 Essential (primary) hypertension: Secondary | ICD-10-CM | POA: Diagnosis not present

## 2016-12-19 DIAGNOSIS — Z4789 Encounter for other orthopedic aftercare: Secondary | ICD-10-CM | POA: Diagnosis not present

## 2016-12-19 DIAGNOSIS — Z96642 Presence of left artificial hip joint: Secondary | ICD-10-CM | POA: Diagnosis not present

## 2016-12-27 ENCOUNTER — Telehealth: Payer: Self-pay | Admitting: Family Medicine

## 2016-12-27 ENCOUNTER — Telehealth: Payer: Self-pay | Admitting: Medical

## 2016-12-27 DIAGNOSIS — M150 Primary generalized (osteo)arthritis: Secondary | ICD-10-CM

## 2016-12-27 DIAGNOSIS — M159 Polyosteoarthritis, unspecified: Secondary | ICD-10-CM

## 2016-12-27 DIAGNOSIS — I10 Essential (primary) hypertension: Secondary | ICD-10-CM

## 2016-12-27 MED ORDER — LOSARTAN POTASSIUM-HCTZ 100-12.5 MG PO TABS: 1.0000 | ORAL_TABLET | Freq: Every day | ORAL | 3 refills | Status: DC

## 2016-12-27 NOTE — Telephone Encounter (Signed)
Pt got 90 day supply of tramadol in the past. Got not due for gabapentin yet. By low can't prescribe 90 days of tramadol. Max number supply is 30 days. So this needs to be deferred to pcp. Will you run it by Dr. Etter Sjogren on Monday. I think she is in office. Sorry.

## 2016-12-27 NOTE — Telephone Encounter (Signed)
Gabapentin not due yet/losartan refilled.  Last refill for tramadol on 09/27/2016  #270 no refills Last office visit 09/17/2016 No Contract  No UDS.

## 2016-12-27 NOTE — Telephone Encounter (Signed)
Relation to LX:BWIO Call back [place]: [place], [place] [phone] (Phone) [phone] (Fax)     Reason for call:  losartan-hydrochlorothiazide (HYZAAR) 100-12.5 MG tablet, traMADol (ULTRAM) 50 MG tablet, gabapentin (NEURONTIN) 100 MG capsule

## 2016-12-30 NOTE — Telephone Encounter (Signed)
Please advise 

## 2016-12-30 NOTE — Telephone Encounter (Signed)
Mackie Pai, PA-C   12/27/16 5:06 PM  Note    Pt got 90 day supply of tramadol in the past. Got not due for gabapentin yet. By low can't prescribe 90 days of tramadol. Max number supply is 30 days. So this needs to be deferred to pcp. Will you run it by Dr. Etter Sjogren on Monday. I think she is in office. Sorry.

## 2016-12-30 NOTE — Telephone Encounter (Signed)
Noted. Message sent to Dr. Carollee Herter.

## 2016-12-30 NOTE — Telephone Encounter (Signed)
We will only be able to do 30 days but we can post date prescriptions so we can give her 3 separate rx for July,  Aug, Sept

## 2016-12-31 ENCOUNTER — Ambulatory Visit (INDEPENDENT_AMBULATORY_CARE_PROVIDER_SITE_OTHER): Payer: Medicare Other | Admitting: Podiatry

## 2016-12-31 ENCOUNTER — Telehealth: Payer: Self-pay | Admitting: Family Medicine

## 2016-12-31 ENCOUNTER — Encounter: Payer: Self-pay | Admitting: Podiatry

## 2016-12-31 DIAGNOSIS — B351 Tinea unguium: Secondary | ICD-10-CM

## 2016-12-31 DIAGNOSIS — M79675 Pain in left toe(s): Secondary | ICD-10-CM

## 2016-12-31 DIAGNOSIS — M79674 Pain in right toe(s): Secondary | ICD-10-CM | POA: Diagnosis not present

## 2016-12-31 MED ORDER — TRAMADOL HCL 50 MG PO TABS: 50.0000 mg | ORAL_TABLET | Freq: Three times a day (TID) | ORAL | 0 refills | Status: DC

## 2016-12-31 NOTE — Telephone Encounter (Signed)
Pt called in to get clarity. Pt received a refill on Tramadol Rx. She says that it is usually sent in to mail order but she also received a paper Rx. She would like to be advised. Was Rx actually sent to mail order ?     CB: [phone]

## 2016-12-31 NOTE — Telephone Encounter (Signed)
Printed tramadol for July, August and September 2018 Called the patient left message to call back.

## 2016-12-31 NOTE — Telephone Encounter (Signed)
Spoke to the son informed him of hardcopy's ready for pickup at the front desk.

## 2016-12-31 NOTE — Progress Notes (Signed)
Subjective: 83 y.o. returns the office today for painful, elongated, thickened toenails which she cannot trim herself. Denies any redness or drainage around the nails. Denies any acute changes since last appointment and no new complaints today. Denies any systemic complaints such as fevers, chills, nausea, vomiting.   Objective: AAO 3, NAD DP/PT pulses palpable, CRT less than 3 seconds Nails hypertrophic, dystrophic, elongated, brittle, discolored 10. There is tenderness overlying the nails 1-5 bilaterally. There is no surrounding erythema or drainage along the nail sites. Ingrowing of her hallux and 2nd digit toenails On the left there are hammertoes and HAV present with erythema off the medial 1st metatarsal head and dorsal PIPJ of the lesser digits from irritation in shoes. No open sores.  No open lesions or pre-ulcerative lesions are identified. No other areas of tenderness bilateral lower extremities. No overlying edema, erythema, increased warmth. No pain with calf compression, swelling, warmth, erythema.  Assessment: Patient presents with symptomatic onychomycosis  Plan: -Treatment options including alternatives, risks, complications were discussed -Nails sharply debrided 10 without complication/bleeding. -Discussed shoe changes to avoid pressure. Monitor for any skin breakdown  -Discussed daily foot inspection. If there are any changes, to call the office immediately.  -Follow-up in 3 months or sooner if any problems are to arise. In the meantime, encouraged to call the office with any questions, concerns, changes symptoms.  Celesta Gentile, DPM

## 2016-12-31 NOTE — Telephone Encounter (Signed)
Patient is going to return hardcopy's for July--August--September---normally tramadol gets faxed to mail order. Explained (see telephone note dated 12/30/16) that we cannot due to law refill #90 of controlled meds. Since she uses mail order would rather [place] fax. So son will return all 3 scripts/will fax July script and shredd--august/september.

## 2017-01-02 NOTE — Telephone Encounter (Signed)
Patient did return all 3 hardcopys/So I faxed the July #90 hardcopy to [facility]. They will have to call monthly from now on and just get a 30 day refill on the tramadol  Patient and son informed faxed July 30 day refill.

## 2017-01-20 ENCOUNTER — Telehealth: Payer: Self-pay | Admitting: Family Medicine

## 2017-01-20 DIAGNOSIS — M159 Polyosteoarthritis, unspecified: Secondary | ICD-10-CM

## 2017-01-20 DIAGNOSIS — M150 Primary generalized (osteo)arthritis: Principal | ICD-10-CM

## 2017-01-20 NOTE — Telephone Encounter (Signed)
Pt says that she would like to have a 90 day supply of Tramadol.       [facility] [place]

## 2017-01-21 ENCOUNTER — Telehealth: Payer: Self-pay | Admitting: *Deleted

## 2017-01-21 MED ORDER — TRAMADOL HCL 50 MG PO TABS: 50.0000 mg | ORAL_TABLET | Freq: Three times a day (TID) | ORAL | 0 refills | Status: DC

## 2017-01-21 NOTE — Telephone Encounter (Signed)
Patient notified and she will try beano

## 2017-01-21 NOTE — Telephone Encounter (Signed)
Patient states that she has gas and burping 1 hour after eating and taking medications.  In the morning she takes Celebrex, tramadol, gabapentin, losartan, centrum, and a laxative.  Any advice on what she could do?

## 2017-01-21 NOTE — Telephone Encounter (Signed)
rx faxed to mail order pharmacy

## 2017-01-21 NOTE — Telephone Encounter (Signed)
Patient checking on the status of medication refill request, patient would like to discuss experiencing gas when she takes medication,  please advise

## 2017-01-21 NOTE — Telephone Encounter (Signed)
Can we get an rx for 90 day supply for mailorder.  It looks like they got 3 rxs for 3 months but they cannot send that to mail order.  She usually gets this thru the mail order.  Can this be done for her?

## 2017-01-21 NOTE — Telephone Encounter (Signed)
Ok for 90 day?  

## 2017-01-21 NOTE — Telephone Encounter (Signed)
She can try gas x or beano before she eats something that is makes her have gas .

## 2017-02-04 ENCOUNTER — Ambulatory Visit: Payer: Medicare Other | Admitting: Family Medicine

## 2017-02-05 ENCOUNTER — Telehealth: Payer: Self-pay | Admitting: Family Medicine

## 2017-02-05 NOTE — Telephone Encounter (Signed)
Self. Refill for CELEBREX  Pt says that medication comes from [facility].

## 2017-02-06 ENCOUNTER — Encounter: Payer: Self-pay | Admitting: Family Medicine

## 2017-02-06 ENCOUNTER — Ambulatory Visit (INDEPENDENT_AMBULATORY_CARE_PROVIDER_SITE_OTHER): Payer: Medicare Other | Admitting: Family Medicine

## 2017-02-06 VITALS — BP 120/82 | HR 103 | Temp 97.7°F | Ht 65.0 in | Wt 164.0 lb

## 2017-02-06 DIAGNOSIS — Z9889 Other specified postprocedural states: Secondary | ICD-10-CM

## 2017-02-06 DIAGNOSIS — R1013 Epigastric pain: Secondary | ICD-10-CM | POA: Diagnosis not present

## 2017-02-06 DIAGNOSIS — Z09 Encounter for follow-up examination after completed treatment for conditions other than malignant neoplasm: Secondary | ICD-10-CM | POA: Diagnosis not present

## 2017-02-06 MED ORDER — MONTELUKAST SODIUM 10 MG PO TABS: 10.0000 mg | ORAL_TABLET | Freq: Every day | ORAL | 1 refills | Status: DC

## 2017-02-06 MED ORDER — RANITIDINE HCL 150 MG PO TABS: 150.0000 mg | ORAL_TABLET | Freq: Two times a day (BID) | ORAL | 5 refills | Status: DC

## 2017-02-06 NOTE — Telephone Encounter (Signed)
Pt also requested refill on singulair.  rx sent in.

## 2017-02-06 NOTE — Progress Notes (Signed)
Pre visit review using our clinic review tool, if applicable. No additional management support is needed unless otherwise documented below in the visit note. 

## 2017-02-06 NOTE — Telephone Encounter (Signed)
Order placed to [facility].  Will receive in 7-10 days

## 2017-02-06 NOTE — Progress Notes (Signed)
Patient ID: Katherine Owen, female    DOB: Mar 26, 1928  Age: 83 y.o. MRN: [id number]    Subjective:  Subjective  HPI Katherine Owen presents for burping that is worsening -- no n/v.  No abd pain.  She has been trying beano ,  Gas ex with no relief.    Review of Systems  Constitutional: Negative for activity change, appetite change, fatigue and unexpected weight change.  Respiratory: Negative for cough and shortness of breath.   Cardiovascular: Negative for chest pain and palpitations.  Gastrointestinal:       + burping  Psychiatric/Behavioral: Negative for behavioral problems and dysphoric mood. The patient is not nervous/anxious.     History Past Medical History:  Diagnosis Date  . Allergy   . Arthritis   . Cataract    Bilateral  . Essential hypertension 05/02/2014  . Headache   . History of shingles   . HLD (hyperlipidemia) 05/02/2014  . Hyperlipidemia   . Hypertension   . Neuropathic pain 05/02/2014  . Osteoarthritis of both shoulders due to rotator cuff injury 04/23/2016  . Poor mobility 08/28/2015  . 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 Spinal stenosis of lumbar region 05/02/2014   S/p decompression at [facility] November 2015     She has a past surgical history that includes Ankle surgery (Left, 2014); Colonoscopy; Spine surgery; Back surgery (2015); Total hip arthroplasty (Bilateral, 2005, 2009); Bunionectomy (Right); Eye surgery; Shoulder surgery (01/11/2016); and Reverse shoulder arthroplasty (Right, 01/11/2016).   Her family history includes Arthritis in her father and mother; Cancer in her mother; Parkinson's disease in her father.She reports that she has quit smoking. Her smoking use included Cigarettes. She has a 20.00 pack-year smoking history. She has never used smokeless tobacco. She reports that she does not drink alcohol or use drugs.  Current Outpatient Prescriptions on File Prior to Visit  Medication Sig Dispense Refill  . acetaminophen (TYLENOL) 500  MG tablet Take 500 mg by mouth every 8 (eight) hours as needed.    Marland Kitchen atorvastatin (LIPITOR) 10 MG tablet Take 1 tablet (10 mg total) by mouth at bedtime. For high cholesterol 90 tablet 1  . celecoxib (CELEBREX) 200 MG capsule Take 1 capsule (200 mg total) by mouth daily. 100 capsule 0  . cetirizine (ZYRTEC ALLERGY) 10 MG tablet Take 1 tablet (10 mg total) by mouth at bedtime.    . gabapentin (NEURONTIN) 100 MG capsule Take 1 capsule (100 mg total) by mouth 3 (three) times daily. 270 capsule 3  . lidocaine (LIDODERM) 5 % Place 2 patches onto the skin at bedtime. Remove & Discard patch within 12 hours or as directed by MD    . losartan-hydrochlorothiazide (HYZAAR) 100-12.5 MG tablet Take 1 tablet by mouth daily. 90 tablet 3  . Melatonin 3 MG CAPS Take 1 capsule by mouth at bedtime.    . Misc. Devices [facility]) Bolton wheel Chair. Use as directed 1 each 0  . montelukast (SINGULAIR) 10 MG tablet Take 1 tablet (10 mg total) by mouth at bedtime. 90 tablet 1  . Multiple Vitamins-Minerals (CENTRUM SILVER) tablet Take 1 tablet by mouth daily.    . NONFORMULARY OR COMPOUNDED ITEM Power wheelchair  Dx spinal stenosis,  Neuropathy, poor mobility 1 each 0  . polyethylene glycol (MIRALAX / GLYCOLAX) packet Take 17 g by mouth daily.    Marland Kitchen senna-docusate (SENOKOT-S) 8.6-50 MG per tablet Take 2 tablets by mouth 2 (two) times daily. For constipation     . traMADol (ULTRAM) 50  MG tablet Take 1 tablet (50 mg total) by mouth 3 (three) times daily. 270 tablet 0   No current facility-administered medications on file prior to visit.      Objective:  Objective  Physical Exam  Constitutional: She is oriented to person, place, and time. She appears well-developed and well-nourished.  HENT:  Head: Normocephalic and atraumatic.  Eyes: Conjunctivae and EOM are normal.  Neck: Normal range of motion. Neck supple. No JVD present. Carotid bruit is not present. No thyromegaly present.  Cardiovascular: Normal rate,  regular rhythm and normal heart sounds.   No murmur heard. Pulmonary/Chest: Effort normal and breath sounds normal. No respiratory distress. She has no wheezes. She has no rales. She exhibits no tenderness.  Abdominal: Soft. She exhibits no distension. There is no tenderness. There is no rebound and no guarding.  Musculoskeletal: She exhibits no edema.  Neurological: She is alert and oriented to person, place, and time.  Psychiatric: She has a normal mood and affect.  Nursing note and vitals reviewed.  BP 120/82 (BP Location: Left Arm, Patient Position: Sitting, Cuff Size: Normal)   Pulse (!) 103   Temp 97.7 F (36.5 C) (Oral)   Ht 5\' 5"  (1.651 m)   Wt 164 lb (74.4 kg)   SpO2 95%   BMI 27.29 kg/m  Wt Readings from Last 3 Encounters:  02/06/17 164 lb (74.4 kg)  07/02/16 162 lb (73.5 kg)  05/21/16 142 lb (64.4 kg)     Lab Results  Component Value Date   WBC 10.4 04/23/2016   HGB 12.4 04/23/2016   HCT 37.8 04/23/2016   PLT 260.0 04/23/2016   GLUCOSE 95 09/17/2016   CHOL 183 09/17/2016   TRIG 162.0 (H) 09/17/2016   HDL 58.70 09/17/2016   LDLCALC 92 09/17/2016   ALT 22 09/17/2016   AST 25 09/17/2016   NA 137 09/17/2016   K 4.1 09/17/2016   CL 99 09/17/2016   CREATININE 0.48 09/17/2016   BUN 16 09/17/2016   CO2 32 09/17/2016   TSH 1.31 10/27/2014    Mr Brain W Wo Contrast  Result Date: 04/28/2016 CLINICAL DATA:  Chronic, worsening bilateral lower extremity paresthesia is an bilateral arm pain and weakness, right worse than left. EXAM: MRI HEAD WITHOUT AND WITH CONTRAST TECHNIQUE: Multiplanar, multiecho pulse sequences of the brain and surrounding structures were obtained without and with intravenous contrast. CONTRAST:  10 mL MultiHance COMPARISON:  None. FINDINGS: Brain: There is no evidence of acute infarct, intracranial hemorrhage, mass, midline shift, or extra-axial fluid collection. The ventricles and sulci are normal for age. Subcortical and periventricular cerebral  white matter T2 hyperintensities are nonspecific but compatible with minimal chronic small vessel ischemic disease. No abnormal enhancement is identified. Vascular: Major intracranial arterial flow voids are preserved. T2 signal in the left sigmoid sinus likely reflects slow flow given normal enhancement. Skull and upper cervical spine: Unremarkable bone marrow signal. Sinuses/Orbits: Prior bilateral cataract extraction. Paranasal sinuses and mastoid air cells are clear. Other: None. IMPRESSION: 1. No acute intracranial abnormality. 2. Minimal chronic small vessel ischemic disease. Electronically Signed   By: Logan Bores M.D.   On: 04/28/2016 09:52   Mr Cervical Spine Wo Contrast  Result Date: 04/28/2016 CLINICAL DATA:  Chronic, worsening bilateral lower extremity paresthesias and bilateral arm pain and weakness, right worse than left. Cervical disc disorder with myelopathy. EXAM: MRI CERVICAL SPINE WITHOUT CONTRAST TECHNIQUE: Multiplanar, multisequence MR imaging of the cervical spine was performed. No intravenous contrast was administered. COMPARISON:  None.  FINDINGS: Alignment: Mild reversal of the normal lordosis in the lower cervical spine. Grade 1 anterolisthesis of C4 on C5 and C7 on T1. Vertebrae: Preserved vertebral body heights without evidence of fracture or osseous lesion. Mild type 1 degenerative endplate changes at P5-0. Cord: Normal signal. Posterior Fossa, vertebral arteries, paraspinal tissues: Unremarkable. Disc levels: Diffuse cervical disc degeneration with mild disc space narrowing at C2-3, moderate narrowing at C5-6 and C6-7, and severe narrowing at C7-T1. C2-3: Central disc protrusion without spinal stenosis. Uncovertebral spurring and moderate left facet arthrosis result in mild bilateral neural foraminal stenosis. C3-4: Central disc protrusion without spinal stenosis. Uncovertebral spurring and moderate left facet arthrosis result in mild left neural foraminal stenosis. C4-5: Disc  bulging, uncovertebral spurring, and mild facet arthrosis result in mild bilateral neural foraminal stenosis without spinal stenosis. C5-6: Moderately large right central disc extrusion results in moderate spinal stenosis and mild-to-moderate right-sided cord flattening. Uncovertebral spurring and mild facet arthrosis result in severe bilateral neural foraminal stenosis. C6-7: Central disc protrusion without spinal stenosis. Uncovertebral spurring results in mild bilateral neural foraminal stenosis. C7-T1: Listhesis with disc uncovering and uncovertebral spurring result in mild bilateral neural foraminal stenosis. There is bilateral facet ankylosis. No spinal stenosis. IMPRESSION: Diffuse cervical disc degeneration most notable at C5-6 where a disc extrusion results in moderate spinal stenosis with mild to moderate cord flattening. Severe bilateral neural foraminal stenosis at this level. Electronically Signed   By: Logan Bores M.D.   On: 04/28/2016 09:47   Mr Thoracic Spine Wo Contrast  Result Date: 04/28/2016 CLINICAL DATA:  Chronic, worsening bilateral lower extremity paresthesias and bilateral arm pain and weakness, right worse than left. EXAM: MRI THORACIC SPINE WITHOUT CONTRAST TECHNIQUE: Multiplanar, multisequence MR imaging of the thoracic spine was performed. No intravenous contrast was administered. COMPARISON:  None. FINDINGS: Alignment: Grade 1 anterolisthesis of C7 on T1. Trace anterolisthesis of T1 on T2. Vertebrae: Preserved vertebral body heights without evidence of fracture or osseous lesion. Mild type 1 degenerative endplate changes at D3-2. Prior posterior fusion at T11-12 with associated susceptibility artifact from pedicle screws. Cord:  Normal signal. Paraspinal and other soft tissues: 1.5 cm T2 hyperintense lesion in the upper pole of the left kidney, likely a cyst. Disc levels: T1-2: Disc bulging, central disc protrusion, and facet arthrosis result in moderate bilateral neural foraminal  stenosis without spinal stenosis. T2-3: Disc bulging greater to the right and facet arthrosis result in moderate right neural foraminal stenosis without spinal stenosis. T3-4: Mild disc bulging greater to the right and mild facet arthrosis result in mild right neural foraminal stenosis without spinal stenosis. T4-5: Mild disc bulging and minimal facet arthrosis without stenosis. T5-6:  Minimal right facet arthrosis without stenosis. T6-7:  Negative. T7-8:  Minimal disc bulging without stenosis. T8-9: Disc bulging results in mild bilateral neural foraminal stenosis without spinal stenosis. T9-10: Disc bulging results in moderate right and mild left neural foraminal stenosis without spinal stenosis. T10-11: Evaluation mildly limited by magnetic susceptibility artifact. Disc bulging and infolding of the ligamentum flavum result in mild spinal stenosis and moderate bilateral neural foraminal stenosis. T11-12: Prior posterior decompression and fusion. Disc bulging and central disc protrusion result in a slight impression on the ventral spinal cord without residual spinal stenosis. Possible mild-to-moderate left neural foraminal narrowing, with assessment limited by artifact. T12-L1: Disc bulging and endplate spurring asymmetric to the left result in mild left neural foraminal stenosis without spinal stenosis. IMPRESSION: 1. Prior T11-12 fusion without residual spinal stenosis. 2. Mild multifactorial spinal  stenosis and moderate bilateral neural foraminal stenosis at T10-11. 3. Diffuse thoracic disc and facet degeneration elsewhere with mild-to-moderate neural foraminal stenosis as above. Electronically Signed   By: Logan Bores M.D.   On: 04/28/2016 09:38     Assessment & Plan:  Plan  I am having Ms. Dahle start on ranitidine. I am also having her maintain her senna-docusate, CENTRUM SILVER, cetirizine, Wheelchair, NONFORMULARY OR COMPOUNDED ITEM, celecoxib, atorvastatin, acetaminophen, polyethylene glycol,  Melatonin, lidocaine, montelukast, gabapentin, losartan-hydrochlorothiazide, and traMADol.  Meds ordered this encounter  Medications  . ranitidine (ZANTAC) 150 MG tablet    Sig: Take 1 tablet (150 mg total) by mouth 2 (two) times daily.    Dispense:  60 tablet    Refill:  5    Problem List Items Addressed This Visit    None    Visit Diagnoses    Dyspepsia    -  Primary   Relevant Medications   ranitidine (ZANTAC) 150 MG tablet    HO on diet for gerd given to pt Call if symptoms do not improve--- can try PPI at that point or GI   Follow-up: Return if symptoms worsen or fail to improve, for as scheduled.  Ann Held, DO

## 2017-02-06 NOTE — Patient Instructions (Signed)
Food Choices for Gastroesophageal Reflux Disease, Adult When you have gastroesophageal reflux disease (GERD), the foods you eat and your eating habits are very important. Choosing the right foods can help ease your discomfort. What guidelines do I need to follow?  Choose fruits, vegetables, whole grains, and low-fat dairy products.  Choose low-fat meat, fish, and poultry.  Limit fats such as oils, salad dressings, butter, nuts, and avocado.  Keep a food diary. This helps you identify foods that cause symptoms.  Avoid foods that cause symptoms. These may be different for everyone.  Eat small meals often instead of 3 large meals a day.  Eat your meals slowly, in a place where you are relaxed.  Limit fried foods.  Cook foods using methods other than frying.  Avoid drinking alcohol.  Avoid drinking large amounts of liquids with your meals.  Avoid bending over or lying down until 2-3 hours after eating. What foods are not recommended? These are some foods and drinks that may make your symptoms worse: Vegetables  Tomatoes. Tomato juice. Tomato and spaghetti sauce. Chili peppers. Onion and garlic. Horseradish. Fruits  Oranges, grapefruit, and lemon (fruit and juice). Meats  High-fat meats, fish, and poultry. This includes hot dogs, ribs, ham, sausage, salami, and bacon. Dairy  Whole milk and chocolate milk. Sour cream. Cream. Butter. Ice cream. Cream cheese. Drinks  Coffee and tea. Bubbly (carbonated) drinks or energy drinks. Condiments  Hot sauce. Barbecue sauce. Sweets/Desserts  Chocolate and cocoa. Donuts. Peppermint and spearmint. Fats and Oils  High-fat foods. This includes French fries and potato chips. Other  Vinegar. Strong spices. This includes black pepper, white pepper, red pepper, cayenne, curry powder, cloves, ginger, and chili powder. The items listed above may not be a complete list of foods and drinks to avoid. Contact your dietitian for more information.    This information is not intended to replace advice given to you by your health care provider. Make sure you discuss any questions you have with your health care provider. Document Released: 12/10/2011 Document Revised: 11/16/2015 Document Reviewed: 04/14/2013 Elsevier Interactive Patient Education  2017 [employer]  

## 2017-02-07 ENCOUNTER — Ambulatory Visit: Payer: Medicare Other | Admitting: Family Medicine

## 2017-02-13 ENCOUNTER — Telehealth: Payer: Self-pay | Admitting: Family Medicine

## 2017-02-13 DIAGNOSIS — K219 Gastro-esophageal reflux disease without esophagitis: Secondary | ICD-10-CM | POA: Diagnosis not present

## 2017-02-13 DIAGNOSIS — M19012 Primary osteoarthritis, left shoulder: Secondary | ICD-10-CM | POA: Diagnosis not present

## 2017-02-13 DIAGNOSIS — M48061 Spinal stenosis, lumbar region without neurogenic claudication: Secondary | ICD-10-CM | POA: Diagnosis not present

## 2017-02-13 DIAGNOSIS — I10 Essential (primary) hypertension: Secondary | ICD-10-CM | POA: Diagnosis not present

## 2017-02-13 DIAGNOSIS — Z87891 Personal history of nicotine dependence: Secondary | ICD-10-CM | POA: Diagnosis not present

## 2017-02-13 DIAGNOSIS — M5010 Cervical disc disorder with radiculopathy, unspecified cervical region: Secondary | ICD-10-CM | POA: Diagnosis not present

## 2017-02-13 DIAGNOSIS — Z96611 Presence of right artificial shoulder joint: Secondary | ICD-10-CM | POA: Diagnosis not present

## 2017-02-13 DIAGNOSIS — E785 Hyperlipidemia, unspecified: Secondary | ICD-10-CM | POA: Diagnosis not present

## 2017-02-13 NOTE — Telephone Encounter (Signed)
Caller name: Thurmond Butts  Relation to pt: Physical Therapist Call back [phone] Pharmacy:  Reason for call: Physical therapist needing verbal approval for pt to have one time a wk for this wk and then twice a wk for 4 wks for therapy and also needing verbal approval occupational therapist to evaluate. Please advise.

## 2017-02-14 NOTE — Telephone Encounter (Signed)
Verbal orders given pending faxed orders.

## 2017-02-17 ENCOUNTER — Telehealth: Payer: Self-pay | Admitting: Family Medicine

## 2017-02-17 DIAGNOSIS — E785 Hyperlipidemia, unspecified: Secondary | ICD-10-CM | POA: Diagnosis not present

## 2017-02-17 DIAGNOSIS — I10 Essential (primary) hypertension: Secondary | ICD-10-CM | POA: Diagnosis not present

## 2017-02-17 DIAGNOSIS — R1013 Epigastric pain: Secondary | ICD-10-CM

## 2017-02-17 DIAGNOSIS — M19012 Primary osteoarthritis, left shoulder: Secondary | ICD-10-CM | POA: Diagnosis not present

## 2017-02-17 DIAGNOSIS — M48061 Spinal stenosis, lumbar region without neurogenic claudication: Secondary | ICD-10-CM | POA: Diagnosis not present

## 2017-02-17 DIAGNOSIS — M5010 Cervical disc disorder with radiculopathy, unspecified cervical region: Secondary | ICD-10-CM | POA: Diagnosis not present

## 2017-02-17 DIAGNOSIS — K219 Gastro-esophageal reflux disease without esophagitis: Secondary | ICD-10-CM | POA: Diagnosis not present

## 2017-02-17 NOTE — Telephone Encounter (Signed)
Patient called asking for referral to GI for gas, patient would like like provider to recommend one since she is not from here.  [phone]

## 2017-02-17 NOTE — Telephone Encounter (Signed)
Is there a specific GI that you prefer?

## 2017-02-17 NOTE — Telephone Encounter (Signed)
Refer to [facility] 

## 2017-02-18 ENCOUNTER — Telehealth: Payer: Self-pay

## 2017-02-18 NOTE — Telephone Encounter (Signed)
Made patient aware her medication Celebrex is here and ready for pick up. Patient state that her son will pick the medication for her. Patient had no questions or concerns at this time. LB

## 2017-02-18 NOTE — Telephone Encounter (Signed)
thanks

## 2017-02-19 DIAGNOSIS — M19012 Primary osteoarthritis, left shoulder: Secondary | ICD-10-CM | POA: Diagnosis not present

## 2017-02-19 DIAGNOSIS — I10 Essential (primary) hypertension: Secondary | ICD-10-CM | POA: Diagnosis not present

## 2017-02-19 DIAGNOSIS — M48061 Spinal stenosis, lumbar region without neurogenic claudication: Secondary | ICD-10-CM | POA: Diagnosis not present

## 2017-02-19 DIAGNOSIS — M5010 Cervical disc disorder with radiculopathy, unspecified cervical region: Secondary | ICD-10-CM | POA: Diagnosis not present

## 2017-02-19 DIAGNOSIS — K219 Gastro-esophageal reflux disease without esophagitis: Secondary | ICD-10-CM | POA: Diagnosis not present

## 2017-02-19 DIAGNOSIS — E785 Hyperlipidemia, unspecified: Secondary | ICD-10-CM | POA: Diagnosis not present

## 2017-02-20 ENCOUNTER — Encounter: Payer: Self-pay | Admitting: Gastroenterology

## 2017-02-20 DIAGNOSIS — M5010 Cervical disc disorder with radiculopathy, unspecified cervical region: Secondary | ICD-10-CM | POA: Diagnosis not present

## 2017-02-20 DIAGNOSIS — E785 Hyperlipidemia, unspecified: Secondary | ICD-10-CM | POA: Diagnosis not present

## 2017-02-20 DIAGNOSIS — K219 Gastro-esophageal reflux disease without esophagitis: Secondary | ICD-10-CM | POA: Diagnosis not present

## 2017-02-20 DIAGNOSIS — I10 Essential (primary) hypertension: Secondary | ICD-10-CM | POA: Diagnosis not present

## 2017-02-20 DIAGNOSIS — M19012 Primary osteoarthritis, left shoulder: Secondary | ICD-10-CM | POA: Diagnosis not present

## 2017-02-20 DIAGNOSIS — M48061 Spinal stenosis, lumbar region without neurogenic claudication: Secondary | ICD-10-CM | POA: Diagnosis not present

## 2017-02-20 NOTE — Addendum Note (Signed)
Addended by: Marin Roberts on: 02/20/2017 08:45 AM   Modules accepted: Orders

## 2017-02-20 NOTE — Telephone Encounter (Signed)
Called the patient and provided her with Dr. Etter Sjogren recommendation. Pt again still wanted the referral and had no additional questions at this time  The referral was placed. Nothing further is needed

## 2017-02-21 DIAGNOSIS — M19012 Primary osteoarthritis, left shoulder: Secondary | ICD-10-CM | POA: Diagnosis not present

## 2017-02-21 DIAGNOSIS — E785 Hyperlipidemia, unspecified: Secondary | ICD-10-CM | POA: Diagnosis not present

## 2017-02-21 DIAGNOSIS — I10 Essential (primary) hypertension: Secondary | ICD-10-CM | POA: Diagnosis not present

## 2017-02-21 DIAGNOSIS — K219 Gastro-esophageal reflux disease without esophagitis: Secondary | ICD-10-CM | POA: Diagnosis not present

## 2017-02-21 DIAGNOSIS — M48061 Spinal stenosis, lumbar region without neurogenic claudication: Secondary | ICD-10-CM | POA: Diagnosis not present

## 2017-02-21 DIAGNOSIS — M5010 Cervical disc disorder with radiculopathy, unspecified cervical region: Secondary | ICD-10-CM | POA: Diagnosis not present

## 2017-02-25 DIAGNOSIS — I10 Essential (primary) hypertension: Secondary | ICD-10-CM | POA: Diagnosis not present

## 2017-02-25 DIAGNOSIS — M48061 Spinal stenosis, lumbar region without neurogenic claudication: Secondary | ICD-10-CM | POA: Diagnosis not present

## 2017-02-25 DIAGNOSIS — M5010 Cervical disc disorder with radiculopathy, unspecified cervical region: Secondary | ICD-10-CM | POA: Diagnosis not present

## 2017-02-25 DIAGNOSIS — M19012 Primary osteoarthritis, left shoulder: Secondary | ICD-10-CM | POA: Diagnosis not present

## 2017-02-25 DIAGNOSIS — E785 Hyperlipidemia, unspecified: Secondary | ICD-10-CM | POA: Diagnosis not present

## 2017-02-25 DIAGNOSIS — K219 Gastro-esophageal reflux disease without esophagitis: Secondary | ICD-10-CM | POA: Diagnosis not present

## 2017-02-26 DIAGNOSIS — M19012 Primary osteoarthritis, left shoulder: Secondary | ICD-10-CM | POA: Diagnosis not present

## 2017-02-26 DIAGNOSIS — E785 Hyperlipidemia, unspecified: Secondary | ICD-10-CM | POA: Diagnosis not present

## 2017-02-26 DIAGNOSIS — I10 Essential (primary) hypertension: Secondary | ICD-10-CM | POA: Diagnosis not present

## 2017-02-26 DIAGNOSIS — K219 Gastro-esophageal reflux disease without esophagitis: Secondary | ICD-10-CM | POA: Diagnosis not present

## 2017-02-26 DIAGNOSIS — M5010 Cervical disc disorder with radiculopathy, unspecified cervical region: Secondary | ICD-10-CM | POA: Diagnosis not present

## 2017-02-26 DIAGNOSIS — M48061 Spinal stenosis, lumbar region without neurogenic claudication: Secondary | ICD-10-CM | POA: Diagnosis not present

## 2017-02-27 DIAGNOSIS — E785 Hyperlipidemia, unspecified: Secondary | ICD-10-CM | POA: Diagnosis not present

## 2017-02-27 DIAGNOSIS — K219 Gastro-esophageal reflux disease without esophagitis: Secondary | ICD-10-CM | POA: Diagnosis not present

## 2017-02-27 DIAGNOSIS — M5010 Cervical disc disorder with radiculopathy, unspecified cervical region: Secondary | ICD-10-CM | POA: Diagnosis not present

## 2017-02-27 DIAGNOSIS — M19012 Primary osteoarthritis, left shoulder: Secondary | ICD-10-CM | POA: Diagnosis not present

## 2017-02-27 DIAGNOSIS — I10 Essential (primary) hypertension: Secondary | ICD-10-CM | POA: Diagnosis not present

## 2017-02-27 DIAGNOSIS — M48061 Spinal stenosis, lumbar region without neurogenic claudication: Secondary | ICD-10-CM | POA: Diagnosis not present

## 2017-02-28 DIAGNOSIS — E785 Hyperlipidemia, unspecified: Secondary | ICD-10-CM | POA: Diagnosis not present

## 2017-02-28 DIAGNOSIS — M5010 Cervical disc disorder with radiculopathy, unspecified cervical region: Secondary | ICD-10-CM | POA: Diagnosis not present

## 2017-02-28 DIAGNOSIS — I10 Essential (primary) hypertension: Secondary | ICD-10-CM | POA: Diagnosis not present

## 2017-02-28 DIAGNOSIS — M48061 Spinal stenosis, lumbar region without neurogenic claudication: Secondary | ICD-10-CM | POA: Diagnosis not present

## 2017-02-28 DIAGNOSIS — K219 Gastro-esophageal reflux disease without esophagitis: Secondary | ICD-10-CM | POA: Diagnosis not present

## 2017-02-28 DIAGNOSIS — M19012 Primary osteoarthritis, left shoulder: Secondary | ICD-10-CM | POA: Diagnosis not present

## 2017-03-03 DIAGNOSIS — M5010 Cervical disc disorder with radiculopathy, unspecified cervical region: Secondary | ICD-10-CM | POA: Diagnosis not present

## 2017-03-03 DIAGNOSIS — I10 Essential (primary) hypertension: Secondary | ICD-10-CM | POA: Diagnosis not present

## 2017-03-03 DIAGNOSIS — M19012 Primary osteoarthritis, left shoulder: Secondary | ICD-10-CM | POA: Diagnosis not present

## 2017-03-03 DIAGNOSIS — K219 Gastro-esophageal reflux disease without esophagitis: Secondary | ICD-10-CM | POA: Diagnosis not present

## 2017-03-03 DIAGNOSIS — M48061 Spinal stenosis, lumbar region without neurogenic claudication: Secondary | ICD-10-CM | POA: Diagnosis not present

## 2017-03-03 DIAGNOSIS — E785 Hyperlipidemia, unspecified: Secondary | ICD-10-CM | POA: Diagnosis not present

## 2017-03-04 DIAGNOSIS — M5010 Cervical disc disorder with radiculopathy, unspecified cervical region: Secondary | ICD-10-CM | POA: Diagnosis not present

## 2017-03-04 DIAGNOSIS — K219 Gastro-esophageal reflux disease without esophagitis: Secondary | ICD-10-CM | POA: Diagnosis not present

## 2017-03-04 DIAGNOSIS — M19012 Primary osteoarthritis, left shoulder: Secondary | ICD-10-CM | POA: Diagnosis not present

## 2017-03-04 DIAGNOSIS — I10 Essential (primary) hypertension: Secondary | ICD-10-CM | POA: Diagnosis not present

## 2017-03-04 DIAGNOSIS — M48061 Spinal stenosis, lumbar region without neurogenic claudication: Secondary | ICD-10-CM | POA: Diagnosis not present

## 2017-03-04 DIAGNOSIS — E785 Hyperlipidemia, unspecified: Secondary | ICD-10-CM | POA: Diagnosis not present

## 2017-03-05 DIAGNOSIS — I10 Essential (primary) hypertension: Secondary | ICD-10-CM | POA: Diagnosis not present

## 2017-03-05 DIAGNOSIS — M48061 Spinal stenosis, lumbar region without neurogenic claudication: Secondary | ICD-10-CM | POA: Diagnosis not present

## 2017-03-05 DIAGNOSIS — M5010 Cervical disc disorder with radiculopathy, unspecified cervical region: Secondary | ICD-10-CM | POA: Diagnosis not present

## 2017-03-05 DIAGNOSIS — E785 Hyperlipidemia, unspecified: Secondary | ICD-10-CM | POA: Diagnosis not present

## 2017-03-05 DIAGNOSIS — M19012 Primary osteoarthritis, left shoulder: Secondary | ICD-10-CM | POA: Diagnosis not present

## 2017-03-05 DIAGNOSIS — K219 Gastro-esophageal reflux disease without esophagitis: Secondary | ICD-10-CM | POA: Diagnosis not present

## 2017-03-06 DIAGNOSIS — I10 Essential (primary) hypertension: Secondary | ICD-10-CM | POA: Diagnosis not present

## 2017-03-06 DIAGNOSIS — M48061 Spinal stenosis, lumbar region without neurogenic claudication: Secondary | ICD-10-CM | POA: Diagnosis not present

## 2017-03-06 DIAGNOSIS — M19012 Primary osteoarthritis, left shoulder: Secondary | ICD-10-CM | POA: Diagnosis not present

## 2017-03-06 DIAGNOSIS — K219 Gastro-esophageal reflux disease without esophagitis: Secondary | ICD-10-CM | POA: Diagnosis not present

## 2017-03-06 DIAGNOSIS — E785 Hyperlipidemia, unspecified: Secondary | ICD-10-CM | POA: Diagnosis not present

## 2017-03-06 DIAGNOSIS — M5010 Cervical disc disorder with radiculopathy, unspecified cervical region: Secondary | ICD-10-CM | POA: Diagnosis not present

## 2017-03-10 DIAGNOSIS — M48061 Spinal stenosis, lumbar region without neurogenic claudication: Secondary | ICD-10-CM | POA: Diagnosis not present

## 2017-03-10 DIAGNOSIS — M5010 Cervical disc disorder with radiculopathy, unspecified cervical region: Secondary | ICD-10-CM | POA: Diagnosis not present

## 2017-03-10 DIAGNOSIS — I10 Essential (primary) hypertension: Secondary | ICD-10-CM | POA: Diagnosis not present

## 2017-03-10 DIAGNOSIS — E785 Hyperlipidemia, unspecified: Secondary | ICD-10-CM | POA: Diagnosis not present

## 2017-03-10 DIAGNOSIS — K219 Gastro-esophageal reflux disease without esophagitis: Secondary | ICD-10-CM | POA: Diagnosis not present

## 2017-03-10 DIAGNOSIS — M19012 Primary osteoarthritis, left shoulder: Secondary | ICD-10-CM | POA: Diagnosis not present

## 2017-03-11 DIAGNOSIS — E785 Hyperlipidemia, unspecified: Secondary | ICD-10-CM | POA: Diagnosis not present

## 2017-03-11 DIAGNOSIS — K219 Gastro-esophageal reflux disease without esophagitis: Secondary | ICD-10-CM | POA: Diagnosis not present

## 2017-03-11 DIAGNOSIS — M48061 Spinal stenosis, lumbar region without neurogenic claudication: Secondary | ICD-10-CM | POA: Diagnosis not present

## 2017-03-11 DIAGNOSIS — M19012 Primary osteoarthritis, left shoulder: Secondary | ICD-10-CM | POA: Diagnosis not present

## 2017-03-11 DIAGNOSIS — M5010 Cervical disc disorder with radiculopathy, unspecified cervical region: Secondary | ICD-10-CM | POA: Diagnosis not present

## 2017-03-11 DIAGNOSIS — I10 Essential (primary) hypertension: Secondary | ICD-10-CM | POA: Diagnosis not present

## 2017-03-12 DIAGNOSIS — K219 Gastro-esophageal reflux disease without esophagitis: Secondary | ICD-10-CM | POA: Diagnosis not present

## 2017-03-12 DIAGNOSIS — M5010 Cervical disc disorder with radiculopathy, unspecified cervical region: Secondary | ICD-10-CM | POA: Diagnosis not present

## 2017-03-12 DIAGNOSIS — M19012 Primary osteoarthritis, left shoulder: Secondary | ICD-10-CM | POA: Diagnosis not present

## 2017-03-12 DIAGNOSIS — M48061 Spinal stenosis, lumbar region without neurogenic claudication: Secondary | ICD-10-CM | POA: Diagnosis not present

## 2017-03-12 DIAGNOSIS — E785 Hyperlipidemia, unspecified: Secondary | ICD-10-CM | POA: Diagnosis not present

## 2017-03-12 DIAGNOSIS — I10 Essential (primary) hypertension: Secondary | ICD-10-CM | POA: Diagnosis not present

## 2017-03-13 ENCOUNTER — Telehealth: Payer: Self-pay | Admitting: Family Medicine

## 2017-03-13 DIAGNOSIS — I10 Essential (primary) hypertension: Secondary | ICD-10-CM | POA: Diagnosis not present

## 2017-03-13 DIAGNOSIS — M48061 Spinal stenosis, lumbar region without neurogenic claudication: Secondary | ICD-10-CM | POA: Diagnosis not present

## 2017-03-13 DIAGNOSIS — M5010 Cervical disc disorder with radiculopathy, unspecified cervical region: Secondary | ICD-10-CM | POA: Diagnosis not present

## 2017-03-13 DIAGNOSIS — E785 Hyperlipidemia, unspecified: Secondary | ICD-10-CM | POA: Diagnosis not present

## 2017-03-13 DIAGNOSIS — M19012 Primary osteoarthritis, left shoulder: Secondary | ICD-10-CM | POA: Diagnosis not present

## 2017-03-13 DIAGNOSIS — K219 Gastro-esophageal reflux disease without esophagitis: Secondary | ICD-10-CM | POA: Diagnosis not present

## 2017-03-13 NOTE — Telephone Encounter (Signed)
Pt called in she would like a referral to an eye Dr. [facility] accepts wheelchair pt's    Please advise.

## 2017-03-14 ENCOUNTER — Other Ambulatory Visit: Payer: Self-pay | Admitting: Family Medicine

## 2017-03-14 DIAGNOSIS — Z0100 Encounter for examination of eyes and vision without abnormal findings: Secondary | ICD-10-CM

## 2017-03-14 NOTE — Telephone Encounter (Signed)
Ok to refer to opth 

## 2017-03-14 NOTE — Telephone Encounter (Signed)
Referral done

## 2017-03-17 DIAGNOSIS — M19012 Primary osteoarthritis, left shoulder: Secondary | ICD-10-CM | POA: Diagnosis not present

## 2017-03-17 DIAGNOSIS — E785 Hyperlipidemia, unspecified: Secondary | ICD-10-CM | POA: Diagnosis not present

## 2017-03-17 DIAGNOSIS — M48061 Spinal stenosis, lumbar region without neurogenic claudication: Secondary | ICD-10-CM | POA: Diagnosis not present

## 2017-03-17 DIAGNOSIS — M5010 Cervical disc disorder with radiculopathy, unspecified cervical region: Secondary | ICD-10-CM | POA: Diagnosis not present

## 2017-03-17 DIAGNOSIS — K219 Gastro-esophageal reflux disease without esophagitis: Secondary | ICD-10-CM | POA: Diagnosis not present

## 2017-03-17 DIAGNOSIS — I10 Essential (primary) hypertension: Secondary | ICD-10-CM | POA: Diagnosis not present

## 2017-03-18 ENCOUNTER — Ambulatory Visit (INDEPENDENT_AMBULATORY_CARE_PROVIDER_SITE_OTHER): Payer: Medicare Other | Admitting: Family Medicine

## 2017-03-18 ENCOUNTER — Ambulatory Visit: Payer: Medicare Other | Admitting: Family Medicine

## 2017-03-18 ENCOUNTER — Telehealth: Payer: Self-pay | Admitting: *Deleted

## 2017-03-18 ENCOUNTER — Encounter: Payer: Self-pay | Admitting: Family Medicine

## 2017-03-18 ENCOUNTER — Ambulatory Visit (HOSPITAL_BASED_OUTPATIENT_CLINIC_OR_DEPARTMENT_OTHER)
Admission: RE | Admit: 2017-03-18 | Discharge: 2017-03-18 | Disposition: A | Discharge status: Home / Self Care | Payer: Medicare Other | Source: Ambulatory Visit | Attending: Family Medicine | Admitting: Family Medicine

## 2017-03-18 VITALS — BP 114/78 | HR 81 | Temp 97.9°F | Resp 18 | Ht 65.0 in

## 2017-03-18 DIAGNOSIS — M19111 Post-traumatic osteoarthritis, right shoulder: Secondary | ICD-10-CM

## 2017-03-18 DIAGNOSIS — M12812 Other specific arthropathies, not elsewhere classified, left shoulder: Secondary | ICD-10-CM | POA: Diagnosis not present

## 2017-03-18 DIAGNOSIS — E785 Hyperlipidemia, unspecified: Secondary | ICD-10-CM | POA: Diagnosis not present

## 2017-03-18 DIAGNOSIS — I10 Essential (primary) hypertension: Secondary | ICD-10-CM | POA: Diagnosis not present

## 2017-03-18 DIAGNOSIS — M25512 Pain in left shoulder: Secondary | ICD-10-CM | POA: Diagnosis not present

## 2017-03-18 DIAGNOSIS — Z23 Encounter for immunization: Secondary | ICD-10-CM | POA: Diagnosis not present

## 2017-03-18 DIAGNOSIS — S46002S Unspecified injury of muscle(s) and tendon(s) of the rotator cuff of left shoulder, sequela: Secondary | ICD-10-CM

## 2017-03-18 DIAGNOSIS — M19112 Post-traumatic osteoarthritis, left shoulder: Secondary | ICD-10-CM

## 2017-03-18 DIAGNOSIS — I700 Atherosclerosis of aorta: Secondary | ICD-10-CM | POA: Insufficient documentation

## 2017-03-18 DIAGNOSIS — R1013 Epigastric pain: Secondary | ICD-10-CM

## 2017-03-18 DIAGNOSIS — S46001S Unspecified injury of muscle(s) and tendon(s) of the rotator cuff of right shoulder, sequela: Secondary | ICD-10-CM | POA: Diagnosis not present

## 2017-03-18 LAB — COMPREHENSIVE METABOLIC PANEL
ALBUMIN: 3.9 g/dL (ref 3.5–5.2)
ALK PHOS: 102 U/L (ref 39–117)
ALT: 19 U/L (ref 0–35)
AST: 29 U/L (ref 0–37)
BUN: 20 mg/dL (ref 6–23)
CALCIUM: 10.2 mg/dL (ref 8.4–10.5)
CHLORIDE: 100 meq/L (ref 96–112)
CO2: 33 mEq/L — ABNORMAL HIGH (ref 19–32)
Creatinine, Ser: 0.55 mg/dL (ref 0.40–1.20)
GFR: 134.36 mL/min (ref >60.00)
Glucose, Bld: 102 mg/dL — ABNORMAL HIGH (ref 70–99)
POTASSIUM: 4.3 meq/L (ref 3.5–5.1)
SODIUM: 139 meq/L (ref 135–145)
TOTAL PROTEIN: 7 g/dL (ref 6.0–8.3)
Total Bilirubin: 0.5 mg/dL (ref 0.2–1.2)

## 2017-03-18 LAB — LIPID PANEL
CHOLESTEROL: 164 mg/dL (ref 0–200)
HDL: 53 mg/dL (ref >39.00)
LDL CALC: 81 mg/dL (ref 0–99)
NonHDL: 110.74
TRIGLYCERIDES: 150 mg/dL — AB (ref 0.0–149.0)
Total CHOL/HDL Ratio: 3
VLDL: 30 mg/dL (ref 0.0–40.0)

## 2017-03-18 MED ORDER — DICLOFENAC SODIUM 2 % TD SOLN: 4.0000 g | Freq: Two times a day (BID) | TRANSDERMAL | 3 refills | Status: DC

## 2017-03-18 MED ORDER — PANTOPRAZOLE SODIUM 40 MG PO TBEC: 40.0000 mg | DELAYED_RELEASE_TABLET | Freq: Every day | ORAL | 3 refills | Status: DC

## 2017-03-18 NOTE — Assessment & Plan Note (Signed)
Tolerating statin, encouraged heart healthy diet, avoid trans fats, minimize simple carbs and saturated fats. Increase exercise as tolerated 

## 2017-03-18 NOTE — Patient Instructions (Signed)
Food Choices for Gastroesophageal Reflux Disease, Adult When you have gastroesophageal reflux disease (GERD), the foods you eat and your eating habits are very important. Choosing the right foods can help ease your discomfort. What guidelines do I need to follow?  Choose fruits, vegetables, whole grains, and low-fat dairy products.  Choose low-fat meat, fish, and poultry.  Limit fats such as oils, salad dressings, butter, nuts, and avocado.  Keep a food diary. This helps you identify foods that cause symptoms.  Avoid foods that cause symptoms. These may be different for everyone.  Eat small meals often instead of 3 large meals a day.  Eat your meals slowly, in a place where you are relaxed.  Limit fried foods.  Cook foods using methods other than frying.  Avoid drinking alcohol.  Avoid drinking large amounts of liquids with your meals.  Avoid bending over or lying down until 2-3 hours after eating. What foods are not recommended? These are some foods and drinks that may make your symptoms worse: Vegetables  Tomatoes. Tomato juice. Tomato and spaghetti sauce. Chili peppers. Onion and garlic. Horseradish. Fruits  Oranges, grapefruit, and lemon (fruit and juice). Meats  High-fat meats, fish, and poultry. This includes hot dogs, ribs, ham, sausage, salami, and bacon. Dairy  Whole milk and chocolate milk. Sour cream. Cream. Butter. Ice cream. Cream cheese. Drinks  Coffee and tea. Bubbly (carbonated) drinks or energy drinks. Condiments  Hot sauce. Barbecue sauce. Sweets/Desserts  Chocolate and cocoa. Donuts. Peppermint and spearmint. Fats and Oils  High-fat foods. This includes French fries and potato chips. Other  Vinegar. Strong spices. This includes black pepper, white pepper, red pepper, cayenne, curry powder, cloves, ginger, and chili powder. The items listed above may not be a complete list of foods and drinks to avoid. Contact your dietitian for more information.    This information is not intended to replace advice given to you by your health care provider. Make sure you discuss any questions you have with your health care provider. Document Released: 12/10/2011 Document Revised: 11/16/2015 Document Reviewed: 04/14/2013 Elsevier Interactive Patient Education  2017 [employer]  

## 2017-03-18 NOTE — Telephone Encounter (Signed)
Received Physician Orders from [facility]; forwarded to provider/SLS 09/25

## 2017-03-18 NOTE — Assessment & Plan Note (Signed)
Well controlled, no changes to meds. Encouraged heart healthy diet such as the DASH diet and exercise as tolerated.  °

## 2017-03-18 NOTE — Assessment & Plan Note (Signed)
Gi pending protonix rx

## 2017-03-18 NOTE — Progress Notes (Signed)
Patient ID: Katherine Owen, female    DOB: 23-Jun-1928  Age: 83 y.o. MRN: [id number]    Subjective:  Subjective  HPI Katherine Owen presents for f/u bp, cholesterol she c/o of con't burping and heartburn and c/o L shoulder pain- hx osteoarthritis.    Review of Systems  Constitutional: Negative for activity change, appetite change, fatigue and unexpected weight change.  Respiratory: Negative for cough and shortness of breath.   Cardiovascular: Negative for chest pain and palpitations.  Gastrointestinal: Positive for abdominal pain. Negative for constipation, diarrhea, nausea, rectal pain and vomiting.  Musculoskeletal: Positive for arthralgias.  Psychiatric/Behavioral: Negative for behavioral problems and dysphoric mood. The patient is not nervous/anxious.     History Past Medical History:  Diagnosis Date  . Allergy   . Arthritis   . Cataract    Bilateral  . Essential hypertension 05/02/2014  . Headache   . History of shingles   . HLD (hyperlipidemia) 05/02/2014  . Hyperlipidemia   . Hypertension   . Neuropathic pain 05/02/2014  . Osteoarthritis of both shoulders due to rotator cuff injury 04/23/2016  . Poor mobility 08/28/2015  . 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 Spinal stenosis of lumbar region 05/02/2014   S/p decompression at [facility] November 2015     She has a past surgical history that includes Ankle surgery (Left, 2014); Colonoscopy; Spine surgery; Back surgery (2015); Total hip arthroplasty (Bilateral, 2005, 2009); Bunionectomy (Right); Eye surgery; Shoulder surgery (01/11/2016); and Reverse shoulder arthroplasty (Right, 01/11/2016).   Her family history includes Arthritis in her father and mother; Cancer in her mother; Parkinson's disease in her father.She reports that she has quit smoking. Her smoking use included Cigarettes. She has a 20.00 pack-year smoking history. She has never used smokeless tobacco. She reports that she does not drink alcohol or use  drugs.  Current Outpatient Prescriptions on File Prior to Visit  Medication Sig Dispense Refill  . acetaminophen (TYLENOL) 500 MG tablet Take 500 mg by mouth every 8 (eight) hours as needed.    Marland Kitchen atorvastatin (LIPITOR) 10 MG tablet Take 1 tablet (10 mg total) by mouth at bedtime. For high cholesterol 90 tablet 1  . celecoxib (CELEBREX) 200 MG capsule Take 1 capsule (200 mg total) by mouth daily. 100 capsule 0  . cetirizine (ZYRTEC ALLERGY) 10 MG tablet Take 1 tablet (10 mg total) by mouth at bedtime.    . gabapentin (NEURONTIN) 100 MG capsule Take 1 capsule (100 mg total) by mouth 3 (three) times daily. 270 capsule 3  . lidocaine (LIDODERM) 5 % Place 2 patches onto the skin at bedtime. Remove & Discard patch within 12 hours or as directed by MD    . losartan-hydrochlorothiazide (HYZAAR) 100-12.5 MG tablet Take 1 tablet by mouth daily. 90 tablet 3  . Melatonin 3 MG CAPS Take 1 capsule by mouth at bedtime.    . Misc. Devices [facility]) Antares wheel Chair. Use as directed 1 each 0  . montelukast (SINGULAIR) 10 MG tablet Take 1 tablet (10 mg total) by mouth at bedtime. 90 tablet 1  . Multiple Vitamins-Minerals (CENTRUM SILVER) tablet Take 1 tablet by mouth daily.    . NONFORMULARY OR COMPOUNDED ITEM Power wheelchair  Dx spinal stenosis,  Neuropathy, poor mobility 1 each 0  . polyethylene glycol (MIRALAX / GLYCOLAX) packet Take 17 g by mouth daily.    . ranitidine (ZANTAC) 150 MG tablet Take 1 tablet (150 mg total) by mouth 2 (two) times daily. 60 tablet 5  .  senna-docusate (SENOKOT-S) 8.6-50 MG per tablet Take 2 tablets by mouth 2 (two) times daily. For constipation     . traMADol (ULTRAM) 50 MG tablet Take 1 tablet (50 mg total) by mouth 3 (three) times daily. 270 tablet 0   No current facility-administered medications on file prior to visit.      Objective:  Objective  Physical Exam  Constitutional: She is oriented to person, place, and time. She appears well-developed and  well-nourished.  HENT:  Head: Normocephalic and atraumatic.  Eyes: Conjunctivae and EOM are normal.  Neck: Normal range of motion. Neck supple. No JVD present. Carotid bruit is not present. No thyromegaly present.  Cardiovascular: Normal rate, regular rhythm and normal heart sounds.   No murmur heard. Pulmonary/Chest: Effort normal and breath sounds normal. No respiratory distress. She has no wheezes. She has no rales. She exhibits no tenderness.  Musculoskeletal: She exhibits tenderness. She exhibits no edema.  Neurological: She is alert and oriented to person, place, and time.  Psychiatric: She has a normal mood and affect.  Nursing note and vitals reviewed.  BP 114/78   Pulse 81   Temp 97.9 F (36.6 C)   Resp 18   Ht 5\' 5"  (1.651 m)   SpO2 94%  Wt Readings from Last 3 Encounters:  02/06/17 164 lb (74.4 kg)  07/02/16 162 lb (73.5 kg)  05/21/16 142 lb (64.4 kg)     Lab Results  Component Value Date   WBC 10.4 04/23/2016   HGB 12.4 04/23/2016   HCT 37.8 04/23/2016   PLT 260.0 04/23/2016   GLUCOSE 102 (H) 03/18/2017   CHOL 164 03/18/2017   TRIG 150.0 (H) 03/18/2017   HDL 53.00 03/18/2017   LDLCALC 81 03/18/2017   ALT 19 03/18/2017   AST 29 03/18/2017   NA 139 03/18/2017   K 4.3 03/18/2017   CL 100 03/18/2017   CREATININE 0.55 03/18/2017   BUN 20 03/18/2017   CO2 33 (H) 03/18/2017   TSH 1.31 10/27/2014    Mr Brain W Wo Contrast  Result Date: 04/28/2016 CLINICAL DATA:  Chronic, worsening bilateral lower extremity paresthesia is an bilateral arm pain and weakness, right worse than left. EXAM: MRI HEAD WITHOUT AND WITH CONTRAST TECHNIQUE: Multiplanar, multiecho pulse sequences of the brain and surrounding structures were obtained without and with intravenous contrast. CONTRAST:  10 mL MultiHance COMPARISON:  None. FINDINGS: Brain: There is no evidence of acute infarct, intracranial hemorrhage, mass, midline shift, or extra-axial fluid collection. The ventricles and sulci  are normal for age. Subcortical and periventricular cerebral white matter T2 hyperintensities are nonspecific but compatible with minimal chronic small vessel ischemic disease. No abnormal enhancement is identified. Vascular: Major intracranial arterial flow voids are preserved. T2 signal in the left sigmoid sinus likely reflects slow flow given normal enhancement. Skull and upper cervical spine: Unremarkable bone marrow signal. Sinuses/Orbits: Prior bilateral cataract extraction. Paranasal sinuses and mastoid air cells are clear. Other: None. IMPRESSION: 1. No acute intracranial abnormality. 2. Minimal chronic small vessel ischemic disease. Electronically Signed   By: Logan Bores M.D.   On: 04/28/2016 09:52   Mr Cervical Spine Wo Contrast  Result Date: 04/28/2016 CLINICAL DATA:  Chronic, worsening bilateral lower extremity paresthesias and bilateral arm pain and weakness, right worse than left. Cervical disc disorder with myelopathy. EXAM: MRI CERVICAL SPINE WITHOUT CONTRAST TECHNIQUE: Multiplanar, multisequence MR imaging of the cervical spine was performed. No intravenous contrast was administered. COMPARISON:  None. FINDINGS: Alignment: Mild reversal of the normal lordosis  in the lower cervical spine. Grade 1 anterolisthesis of C4 on C5 and C7 on T1. Vertebrae: Preserved vertebral body heights without evidence of fracture or osseous lesion. Mild type 1 degenerative endplate changes at Y4-0. Cord: Normal signal. Posterior Fossa, vertebral arteries, paraspinal tissues: Unremarkable. Disc levels: Diffuse cervical disc degeneration with mild disc space narrowing at C2-3, moderate narrowing at C5-6 and C6-7, and severe narrowing at C7-T1. C2-3: Central disc protrusion without spinal stenosis. Uncovertebral spurring and moderate left facet arthrosis result in mild bilateral neural foraminal stenosis. C3-4: Central disc protrusion without spinal stenosis. Uncovertebral spurring and moderate left facet arthrosis  result in mild left neural foraminal stenosis. C4-5: Disc bulging, uncovertebral spurring, and mild facet arthrosis result in mild bilateral neural foraminal stenosis without spinal stenosis. C5-6: Moderately large right central disc extrusion results in moderate spinal stenosis and mild-to-moderate right-sided cord flattening. Uncovertebral spurring and mild facet arthrosis result in severe bilateral neural foraminal stenosis. C6-7: Central disc protrusion without spinal stenosis. Uncovertebral spurring results in mild bilateral neural foraminal stenosis. C7-T1: Listhesis with disc uncovering and uncovertebral spurring result in mild bilateral neural foraminal stenosis. There is bilateral facet ankylosis. No spinal stenosis. IMPRESSION: Diffuse cervical disc degeneration most notable at C5-6 where a disc extrusion results in moderate spinal stenosis with mild to moderate cord flattening. Severe bilateral neural foraminal stenosis at this level. Electronically Signed   By: Logan Bores M.D.   On: 04/28/2016 09:47   Mr Thoracic Spine Wo Contrast  Result Date: 04/28/2016 CLINICAL DATA:  Chronic, worsening bilateral lower extremity paresthesias and bilateral arm pain and weakness, right worse than left. EXAM: MRI THORACIC SPINE WITHOUT CONTRAST TECHNIQUE: Multiplanar, multisequence MR imaging of the thoracic spine was performed. No intravenous contrast was administered. COMPARISON:  None. FINDINGS: Alignment: Grade 1 anterolisthesis of C7 on T1. Trace anterolisthesis of T1 on T2. Vertebrae: Preserved vertebral body heights without evidence of fracture or osseous lesion. Mild type 1 degenerative endplate changes at H4-7. Prior posterior fusion at T11-12 with associated susceptibility artifact from pedicle screws. Cord:  Normal signal. Paraspinal and other soft tissues: 1.5 cm T2 hyperintense lesion in the upper pole of the left kidney, likely a cyst. Disc levels: T1-2: Disc bulging, central disc protrusion, and  facet arthrosis result in moderate bilateral neural foraminal stenosis without spinal stenosis. T2-3: Disc bulging greater to the right and facet arthrosis result in moderate right neural foraminal stenosis without spinal stenosis. T3-4: Mild disc bulging greater to the right and mild facet arthrosis result in mild right neural foraminal stenosis without spinal stenosis. T4-5: Mild disc bulging and minimal facet arthrosis without stenosis. T5-6:  Minimal right facet arthrosis without stenosis. T6-7:  Negative. T7-8:  Minimal disc bulging without stenosis. T8-9: Disc bulging results in mild bilateral neural foraminal stenosis without spinal stenosis. T9-10: Disc bulging results in moderate right and mild left neural foraminal stenosis without spinal stenosis. T10-11: Evaluation mildly limited by magnetic susceptibility artifact. Disc bulging and infolding of the ligamentum flavum result in mild spinal stenosis and moderate bilateral neural foraminal stenosis. T11-12: Prior posterior decompression and fusion. Disc bulging and central disc protrusion result in a slight impression on the ventral spinal cord without residual spinal stenosis. Possible mild-to-moderate left neural foraminal narrowing, with assessment limited by artifact. T12-L1: Disc bulging and endplate spurring asymmetric to the left result in mild left neural foraminal stenosis without spinal stenosis. IMPRESSION: 1. Prior T11-12 fusion without residual spinal stenosis. 2. Mild multifactorial spinal stenosis and moderate bilateral neural foraminal stenosis at  T10-11. 3. Diffuse thoracic disc and facet degeneration elsewhere with mild-to-moderate neural foraminal stenosis as above. Electronically Signed   By: Logan Bores M.D.   On: 04/28/2016 09:38     Assessment & Plan:  Plan  I am having Ms. Benincasa start on pantoprazole. I am also having her maintain her senna-docusate, CENTRUM SILVER, cetirizine, Wheelchair, NONFORMULARY OR COMPOUNDED ITEM,  celecoxib, atorvastatin, acetaminophen, polyethylene glycol, Melatonin, lidocaine, gabapentin, losartan-hydrochlorothiazide, traMADol, ranitidine, montelukast, and Diclofenac Sodium.  Meds ordered this encounter  Medications  . pantoprazole (PROTONIX) 40 MG tablet    Sig: Take 1 tablet (40 mg total) by mouth daily.    Dispense:  30 tablet    Refill:  3  . DISCONTD: Diclofenac Sodium (PENNSAID) 2 % SOLN    Sig: Place 4 g onto the skin 2 (two) times daily.    Dispense:  112 g    Refill:  3  . Diclofenac Sodium (PENNSAID) 2 % SOLN    Sig: Place 4 g onto the skin 2 (two) times daily.    Dispense:  112 g    Refill:  3    Problem List Items Addressed This Visit      Unprioritized   Left shoulder pain   Relevant Orders   DG Shoulder Left (Completed)   Dyspepsia - Primary    Gi pending protonix rx       Relevant Medications   pantoprazole (PROTONIX) 40 MG tablet   Essential hypertension    Well controlled, no changes to meds. Encouraged heart healthy diet such as the DASH diet and exercise as tolerated.       Relevant Orders   Lipid panel (Completed)   Comprehensive metabolic panel (Completed)   HLD (hyperlipidemia)    Tolerating statin, encouraged heart healthy diet, avoid trans fats, minimize simple carbs and saturated fats. Increase exercise as tolerated      Osteoarthritis of both shoulders due to rotator cuff injury    Per ortho      Relevant Medications   Diclofenac Sodium (PENNSAID) 2 % SOLN    Other Visit Diagnoses    Hyperlipidemia LDL goal <100       Relevant Orders   Lipid panel (Completed)   Comprehensive metabolic panel (Completed)   Flu vaccine need       Relevant Orders   Flu vaccine HIGH DOSE PF (Fluzone High dose) (Completed)      Follow-up: Return in about 6 months (around 09/15/2017) for hypertension, hyperlipidemia.  Ann Held, DO

## 2017-03-18 NOTE — Assessment & Plan Note (Signed)
Per ortho.  

## 2017-03-19 DIAGNOSIS — K219 Gastro-esophageal reflux disease without esophagitis: Secondary | ICD-10-CM | POA: Diagnosis not present

## 2017-03-19 DIAGNOSIS — M48061 Spinal stenosis, lumbar region without neurogenic claudication: Secondary | ICD-10-CM | POA: Diagnosis not present

## 2017-03-19 DIAGNOSIS — M19012 Primary osteoarthritis, left shoulder: Secondary | ICD-10-CM | POA: Diagnosis not present

## 2017-03-19 DIAGNOSIS — I10 Essential (primary) hypertension: Secondary | ICD-10-CM | POA: Diagnosis not present

## 2017-03-19 DIAGNOSIS — M5010 Cervical disc disorder with radiculopathy, unspecified cervical region: Secondary | ICD-10-CM | POA: Diagnosis not present

## 2017-03-19 DIAGNOSIS — E785 Hyperlipidemia, unspecified: Secondary | ICD-10-CM | POA: Diagnosis not present

## 2017-03-24 DIAGNOSIS — M5010 Cervical disc disorder with radiculopathy, unspecified cervical region: Secondary | ICD-10-CM | POA: Diagnosis not present

## 2017-03-24 DIAGNOSIS — K219 Gastro-esophageal reflux disease without esophagitis: Secondary | ICD-10-CM | POA: Diagnosis not present

## 2017-03-24 DIAGNOSIS — I10 Essential (primary) hypertension: Secondary | ICD-10-CM | POA: Diagnosis not present

## 2017-03-24 DIAGNOSIS — M19012 Primary osteoarthritis, left shoulder: Secondary | ICD-10-CM | POA: Diagnosis not present

## 2017-03-24 DIAGNOSIS — M48061 Spinal stenosis, lumbar region without neurogenic claudication: Secondary | ICD-10-CM | POA: Diagnosis not present

## 2017-03-24 DIAGNOSIS — E785 Hyperlipidemia, unspecified: Secondary | ICD-10-CM | POA: Diagnosis not present

## 2017-03-26 DIAGNOSIS — E785 Hyperlipidemia, unspecified: Secondary | ICD-10-CM | POA: Diagnosis not present

## 2017-03-26 DIAGNOSIS — I10 Essential (primary) hypertension: Secondary | ICD-10-CM | POA: Diagnosis not present

## 2017-03-26 DIAGNOSIS — M5010 Cervical disc disorder with radiculopathy, unspecified cervical region: Secondary | ICD-10-CM | POA: Diagnosis not present

## 2017-03-26 DIAGNOSIS — M19012 Primary osteoarthritis, left shoulder: Secondary | ICD-10-CM | POA: Diagnosis not present

## 2017-03-26 DIAGNOSIS — M48061 Spinal stenosis, lumbar region without neurogenic claudication: Secondary | ICD-10-CM | POA: Diagnosis not present

## 2017-03-26 DIAGNOSIS — K219 Gastro-esophageal reflux disease without esophagitis: Secondary | ICD-10-CM | POA: Diagnosis not present

## 2017-04-08 ENCOUNTER — Ambulatory Visit (INDEPENDENT_AMBULATORY_CARE_PROVIDER_SITE_OTHER): Payer: Medicare Other | Admitting: Podiatry

## 2017-04-08 ENCOUNTER — Encounter: Payer: Self-pay | Admitting: Podiatry

## 2017-04-08 ENCOUNTER — Ambulatory Visit: Payer: Medicare Other | Admitting: Podiatry

## 2017-04-08 DIAGNOSIS — B351 Tinea unguium: Secondary | ICD-10-CM | POA: Diagnosis not present

## 2017-04-08 DIAGNOSIS — M79674 Pain in right toe(s): Secondary | ICD-10-CM

## 2017-04-08 DIAGNOSIS — M79675 Pain in left toe(s): Secondary | ICD-10-CM

## 2017-04-08 DIAGNOSIS — M79672 Pain in left foot: Secondary | ICD-10-CM

## 2017-04-08 NOTE — Progress Notes (Signed)
Subjective: 83 y.o. returns the office today for painful, elongated, thickened toenails which she cannot trim herself. Denies any redness or drainage around the nails. She also states that she had some occasional intermittent heel pain at nighttime the back of her heel wedges in bed. She does use a pillow to help offload the heels which does help and she does not get pain every day. She currently e denies any recent injury or trauma. Denies any acute changes since last appointment and no new complaints today. Denies any systemic complaints such as fevers, chills, nausea, vomiting.   Objective: AAO 3, NAD DP/PT pulses palpable, CRT less than 3 seconds Nails hypertrophic, dystrophic, elongated, brittle, discolored 10. There is tenderness overlying the nails 1-5 bilaterally. There is no surrounding erythema or drainage along the nail sites. Ingrowing of her hallux and 2nd digit toenails On the left there are hammertoes and HAV present with erythema off the medial 1st metatarsal head and dorsal PIPJ of the lesser digits from irritation in shoes. No open sores.  There is no pain of the heel today and is no open sore or any pre-ulcerative areas. There is no pain with later compression of the calcaneus. Is no pain along the course or insertion of the Achilles tendon. No open lesions or pre-ulcerative lesions are identified. No other areas of tenderness bilateral lower extremities. No overlying edema, erythema, increased warmth. No pain with calf compression, swelling, warmth, erythema.  Assessment: Patient presents with symptomatic onychomycosis; heel pain intermittently  Plan: -Treatment options including alternatives, risks, complications were discussed -Nails sharply debrided 10 without complication/bleeding. -Discussed shoe changes to avoid pressure. Monitor for any skin breakdown  -Continue offloading of the area daily and nighttime. He more of her pain is due to pressure from being in bed. Also  discussed stretching exercises as well as a small amount of ice to the area. If her symptoms are not improving to call the office for follow-up.  -Discussed daily foot inspection. If there are any changes, to call the office immediately.  -Follow-up in 3 months or sooner if any problems are to arise. In the meantime, encouraged to call the office with any questions, concerns, changes symptoms.  Celesta Gentile, DPM

## 2017-04-15 ENCOUNTER — Other Ambulatory Visit (INDEPENDENT_AMBULATORY_CARE_PROVIDER_SITE_OTHER): Payer: Medicare Other

## 2017-04-15 ENCOUNTER — Encounter: Payer: Self-pay | Admitting: Gastroenterology

## 2017-04-15 ENCOUNTER — Ambulatory Visit (INDEPENDENT_AMBULATORY_CARE_PROVIDER_SITE_OTHER): Payer: Medicare Other | Admitting: Gastroenterology

## 2017-04-15 VITALS — BP 138/82 | HR 96

## 2017-04-15 DIAGNOSIS — R1013 Epigastric pain: Secondary | ICD-10-CM | POA: Diagnosis not present

## 2017-04-15 LAB — H. PYLORI ANTIBODY, IGG: H PYLORI IGG: POSITIVE — AB

## 2017-04-15 MED ORDER — PANTOPRAZOLE SODIUM 40 MG PO TBEC: 40.0000 mg | DELAYED_RELEASE_TABLET | Freq: Every day | ORAL | 3 refills | Status: DC

## 2017-04-15 NOTE — Patient Instructions (Addendum)
You will have labs checked today in the basement lab.  Please head down after you check out with the front desk  (H. Pylori serology)  Please start one gas-ex pill with every meal.  Stay on protonix every morning, one pill.  Will change to [employer] (mail supply).  1 pill daily.  Pepcid is best taken at bedtime.  Normal BMI (Body Mass Index- based on height and weight) is between 23 and 30. Your BMI today is There is no height or weight on file to calculate BMI. Marland Kitchen Please consider follow up  regarding your BMI with your Primary Care Provider.

## 2017-04-15 NOTE — Progress Notes (Signed)
HPI: This is a  Very pleasant  who was referred to me by Carollee Herter, [name], *  to evaluate  dyspepsia .    Chief complaint is belching  Belching a lot.  This started about 2-3 months ago. She cannot point to any particular foods that bring it on. We reviewed her daily diet and she seems to eat a very well-balanced diet, 3 square meals a day.  She has no trouble swallowing. Her weight has been increasing by about 20 pounds in the past 2 years.  Her son is with her today  She gets around in a wheelchair in the house and out of the house.  She has no trouble with her bowels. She has no heartburn.  She saw her primary care physician about a month ago for this issue and she was started on proton pump inhibitor once every morning and that has definitely helped her belching but not completely resolved.  She also takes a Pepcid at around dinnertime every night   Review of systems: Pertinent positive and negative review of systems were noted in the above HPI section. All other review negative.   Past Medical History:  Diagnosis Date  . Allergy   . Arthritis   . Cataract    Bilateral  . Essential hypertension 05/02/2014  . Headache   . History of shingles   . HLD (hyperlipidemia) 05/02/2014  . Hyperlipidemia   . Hypertension   . Neuropathic pain 05/02/2014  . Osteoarthritis of both shoulders due to rotator cuff injury 04/23/2016  . Poor mobility 08/28/2015  . 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 Spinal stenosis of lumbar region 05/02/2014   S/p decompression at [facility] November 2015     Past Surgical History:  Procedure Laterality Date  . ANKLE SURGERY Left 2014  . BACK SURGERY  2015   [place]   . BUNIONECTOMY Right   . COLONOSCOPY    . EYE SURGERY     cataract  . REVERSE SHOULDER ARTHROPLASTY Right 01/11/2016   Procedure: REVERSE SHOULDER ARTHROPLASTY;  Surgeon: Justice Britain, MD;  Location: [place];  Service: Orthopedics;  Laterality: Right;  . SHOULDER SURGERY   01/11/2016   REVERSE SHOULDER ARTHROPLASTY on 01/11/2016  . SPINE SURGERY     done at [facility] Apr 26, 2014, lumbar decompression  . TOTAL HIP ARTHROPLASTY Bilateral 2005, 2009   [place] , [place]    Current Outpatient Prescriptions  Medication Sig Dispense Refill  . atorvastatin (LIPITOR) 10 MG tablet Take 1 tablet (10 mg total) by mouth at bedtime. For high cholesterol 90 tablet 1  . celecoxib (CELEBREX) 200 MG capsule Take 1 capsule (200 mg total) by mouth daily. 100 capsule 0  . Cholecalciferol (VITAMIN D3) 5000 units CAPS Take 1 capsule by mouth at bedtime.    . fluticasone (FLONASE) 50 MCG/ACT nasal spray Place 1 spray into both nostrils as needed for allergies or rhinitis.    Marland Kitchen gabapentin (NEURONTIN) 100 MG capsule Take 1 capsule (100 mg total) by mouth 3 (three) times daily. 270 capsule 3  . lidocaine (XYLOCAINE) 4 % external solution Apply topically daily as needed.    Marland Kitchen losartan-hydrochlorothiazide (HYZAAR) 100-12.5 MG tablet Take 1 tablet by mouth daily. 90 tablet 3  . Misc. Devices [facility]) [place] wheel Chair. Use as directed 1 each 0  . montelukast (SINGULAIR) 10 MG tablet Take 1 tablet (10 mg total) by mouth at bedtime. 90 tablet 1  . Multiple Vitamins-Minerals (CENTRUM SILVER) tablet Take 1  tablet by mouth daily.    . pantoprazole (PROTONIX) 40 MG tablet Take 1 tablet (40 mg total) by mouth daily. 30 tablet 3  . senna-docusate (SENOKOT-S) 8.6-50 MG per tablet Take 2 tablets by mouth 2 (two) times daily. For constipation     . traMADol (ULTRAM) 50 MG tablet Take 1 tablet (50 mg total) by mouth 3 (three) times daily. 270 tablet 0   No current facility-administered medications for this visit.     Allergies as of 04/15/2017 - Review Complete 04/15/2017  Allergen Reaction Noted  . No known allergies  01/10/2016    Family History  Problem Relation Age of Onset  . Arthritis Mother        osteoarthritis  . Colon cancer Mother   . Arthritis Father        osteoarthritis  .  Parkinson's disease Father   . Anesthesia problems Neg Hx     Social History   Social History  . Marital status: Widowed    Spouse name: N/A  . Number of children: 3  . Years of education: N/A   Occupational History  . 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 Smoking status: Former Smoker    Packs/day: 1.00    Years: 20.00    Types: Cigarettes    Quit date: 04/16/1967  . Smokeless tobacco: Never Used     Comment: quit somking in early  1970's  . Alcohol use No  . Drug use: No  . Sexual activity: Not Currently   Other Topics Concern  . Not on file   Social History Narrative  . No narrative on file     Physical Exam: BP 138/82 (BP Location: Left Arm, Patient Position: Sitting, Cuff Size: Normal)   Pulse 96   sits in a wheelchair Constitutional: generally well-appearing Psychiatric: alert and oriented x3 Eyes: extraocular movements intact Mouth: oral pharynx moist, no lesions Neck: supple no lymphadenopathy Cardiovascular: heart regular rate and rhythm Lungs: clear to auscultation bilaterally Abdomen: soft, nontender, nondistended, no obvious ascites, no peritoneal signs, normal bowel sounds Extremities: no lower extremity edema bilaterally Skin: no lesions on visible extremities   Assessment and plan: 83 y.o. female with  Dyspepsia, belching  I think this is functional, possibly diet related however she does eat a very well-balanced diet it sounds like. She has been gaining weight over the past 2 years and so I think it is unlikely she has anything serious like a neoplasm. She has had at least partial response from antiacid control. I recommended she change the way she is taking her H2 blocker so that it is at bedtime every night and sit up with her dinner meal. She will continue taking her proton pump inhibitor in the morning. We will test for H. Pylori by serology and if it is positive we will treat. She is also going to start taking  Gas-X pill with every meal.    Please see the "Patient Instructions" section for addition details about the plan.   Owens Loffler, MD [facility] 04/15/2017, 9:23 AM  Cc: Carollee Herter, [name], *

## 2017-04-17 ENCOUNTER — Other Ambulatory Visit: Payer: Self-pay

## 2017-04-17 MED ORDER — OMEPRAZOLE 20 MG PO TBDD
20.0000 mg | DELAYED_RELEASE_TABLET | Freq: Every day | ORAL | 0 refills | Status: DC
Start: 2017-04-17 — End: 2017-04-17

## 2017-04-17 MED ORDER — TETRACYCLINE HCL 500 MG PO CAPS: 500.0000 mg | ORAL_CAPSULE | Freq: Four times a day (QID) | ORAL | 0 refills | Status: DC

## 2017-04-17 MED ORDER — BISMUTH SUBSALICYLATE 262 MG PO TABS: 524.0000 mg | ORAL_TABLET | Freq: Four times a day (QID) | ORAL | 0 refills | Status: DC

## 2017-04-17 MED ORDER — METRONIDAZOLE 500 MG PO TABS: 500.0000 mg | ORAL_TABLET | Freq: Three times a day (TID) | ORAL | 0 refills | Status: DC

## 2017-04-17 MED ORDER — OMEPRAZOLE 20 MG PO TBDD: 20.0000 mg | DELAYED_RELEASE_TABLET | Freq: Every day | ORAL | 0 refills | Status: DC

## 2017-04-18 ENCOUNTER — Telehealth: Payer: Self-pay | Admitting: Gastroenterology

## 2017-04-18 ENCOUNTER — Other Ambulatory Visit: Payer: Self-pay

## 2017-04-18 NOTE — Telephone Encounter (Signed)
Advised patient of the medications that were sent to her pharmacy yesterday.

## 2017-04-21 ENCOUNTER — Telehealth: Payer: Self-pay | Admitting: Gastroenterology

## 2017-04-22 ENCOUNTER — Telehealth: Payer: Self-pay | Admitting: Family Medicine

## 2017-04-22 ENCOUNTER — Telehealth: Payer: Self-pay | Admitting: Gastroenterology

## 2017-04-22 DIAGNOSIS — E785 Hyperlipidemia, unspecified: Secondary | ICD-10-CM

## 2017-04-22 DIAGNOSIS — M159 Polyosteoarthritis, unspecified: Secondary | ICD-10-CM

## 2017-04-22 DIAGNOSIS — M150 Primary generalized (osteo)arthritis: Secondary | ICD-10-CM

## 2017-04-22 MED ORDER — AMOXICILLIN 500 MG PO TABS: 1000.0000 mg | ORAL_TABLET | Freq: Two times a day (BID) | ORAL | 0 refills | Status: AC

## 2017-04-22 MED ORDER — GABAPENTIN 100 MG PO CAPS: 100.0000 mg | ORAL_CAPSULE | Freq: Three times a day (TID) | ORAL | 1 refills | Status: DC

## 2017-04-22 MED ORDER — CLARITHROMYCIN 500 MG PO TABS: 500.0000 mg | ORAL_TABLET | Freq: Two times a day (BID) | ORAL | 0 refills | Status: DC

## 2017-04-22 MED ORDER — ATORVASTATIN CALCIUM 10 MG PO TABS: 10.0000 mg | ORAL_TABLET | Freq: Every day | ORAL | 1 refills | Status: DC

## 2017-04-22 NOTE — Telephone Encounter (Signed)
The pt has been advised that the prescription has been sent to the pharmacy

## 2017-04-22 NOTE — Telephone Encounter (Signed)
Database ran and is on your desk for tramadol  Last filled per database: 02/05/2017 Last written: 01/21/2017  #[id number]  Last ov: 03/18/2017 Next ov: 09/18/2017 Contract: not on file will get at next visit UDS: not on file will get at next visit  Other rxs requested sent in.

## 2017-04-22 NOTE — Telephone Encounter (Signed)
Refill x1   1 refill 

## 2017-04-22 NOTE — Telephone Encounter (Signed)
Okay, let us try clarithromycin 500 mg 1 pill twice daily for 14 days.  Amoxicillin 1 g twice daily for 14 days.  Proton pump inhibitor twice daily as well.

## 2017-04-22 NOTE — Telephone Encounter (Signed)
The pt has been advised and will continue as directed.  She will call with any further concerns

## 2017-04-22 NOTE — Telephone Encounter (Signed)
Caller name: Margene Relation to pt: self  Call back number: [phone] Pharmacy: [address]   Reason for call: Pt is requesting refill on traMADol (ULTRAM) 50 MG tablet, atorvastatin (LIPITOR) 10 MG tablet and  gabapentin (NEURONTIN) 100 MG capsule. Please advise.

## 2017-04-23 ENCOUNTER — Telehealth: Payer: Self-pay | Admitting: Gastroenterology

## 2017-04-23 NOTE — Telephone Encounter (Signed)
The pt states his mother is having nausea and diarrhea after starting clarithromycin and amoxicillin.  I advised the pt's son to stop the abx and do a trial of one and if no symptoms try the other and see if it causes the symptoms.  He will call back and confirm which abx caused the problems and we will send to Dr Ardis Hughs for an alternative.

## 2017-04-24 NOTE — Telephone Encounter (Signed)
Patient states that [facility] did not receive this refill for traMADol. Please review

## 2017-04-25 ENCOUNTER — Telehealth: Payer: Self-pay | Admitting: Gastroenterology

## 2017-04-25 MED ORDER — TRAMADOL HCL 50 MG PO TABS: 50.0000 mg | ORAL_TABLET | Freq: Three times a day (TID) | ORAL | 0 refills | Status: DC

## 2017-04-25 NOTE — Telephone Encounter (Signed)
rx faxed to mail order and patient notified by front staff.

## 2017-04-25 NOTE — Telephone Encounter (Signed)
The pt has been advised to complete the abx as directed and will call if her symptoms worsen

## 2017-04-25 NOTE — Telephone Encounter (Signed)
Pt called checking status refill request.

## 2017-04-30 ENCOUNTER — Telehealth: Payer: Self-pay | Admitting: Family Medicine

## 2017-04-30 DIAGNOSIS — M1990 Unspecified osteoarthritis, unspecified site: Secondary | ICD-10-CM

## 2017-04-30 DIAGNOSIS — M48061 Spinal stenosis, lumbar region without neurogenic claudication: Secondary | ICD-10-CM

## 2017-04-30 NOTE — Telephone Encounter (Signed)
Patient called back stating she's requesting "regular" wheelchair not electrical, please advise

## 2017-04-30 NOTE — Telephone Encounter (Signed)
Relation to TM:YTRZ  Call back number: [phone]   Reason for call:  Patient requesting hardcopy rx for electrical wheelchair, due to current wheelchair breaking down. Patient states she contacted Medicare and they will cover. Patient will have son or friend pick up Rx when ready, please advise

## 2017-05-02 MED ORDER — WHEELCHAIR MISC: 0 refills | Status: AC

## 2017-05-02 NOTE — Telephone Encounter (Signed)
That is fine Also osteoarthritis

## 2017-05-02 NOTE — Telephone Encounter (Signed)
Forgot to ask what diagnosis would you like to use, stenosis?

## 2017-05-02 NOTE — Telephone Encounter (Signed)
rx printed and is awaiting provider signature on Monday.

## 2017-05-02 NOTE — Telephone Encounter (Signed)
Ok to do wheelchair rx

## 2017-05-05 NOTE — Telephone Encounter (Signed)
Patient called and Katherine Owen told her that rx is at front for pickup.

## 2017-05-12 ENCOUNTER — Encounter (HOSPITAL_COMMUNITY): Payer: Self-pay | Admitting: Emergency Medicine

## 2017-05-12 ENCOUNTER — Other Ambulatory Visit: Payer: Self-pay

## 2017-05-12 ENCOUNTER — Emergency Department (HOSPITAL_COMMUNITY): Payer: Medicare Other

## 2017-05-12 ENCOUNTER — Emergency Department (HOSPITAL_COMMUNITY)
Admission: EM | Admit: 2017-05-12 | Discharge: 2017-05-12 | Disposition: A | Discharge status: Home / Self Care | Payer: Medicare Other | Attending: Emergency Medicine | Admitting: Emergency Medicine

## 2017-05-12 DIAGNOSIS — Z79899 Other long term (current) drug therapy: Secondary | ICD-10-CM | POA: Insufficient documentation

## 2017-05-12 DIAGNOSIS — R109 Unspecified abdominal pain: Secondary | ICD-10-CM | POA: Insufficient documentation

## 2017-05-12 DIAGNOSIS — Z87891 Personal history of nicotine dependence: Secondary | ICD-10-CM | POA: Insufficient documentation

## 2017-05-12 DIAGNOSIS — R9431 Abnormal electrocardiogram [ECG] [EKG]: Secondary | ICD-10-CM | POA: Diagnosis not present

## 2017-05-12 DIAGNOSIS — R404 Transient alteration of awareness: Secondary | ICD-10-CM | POA: Diagnosis not present

## 2017-05-12 DIAGNOSIS — I10 Essential (primary) hypertension: Secondary | ICD-10-CM | POA: Insufficient documentation

## 2017-05-12 DIAGNOSIS — K449 Diaphragmatic hernia without obstruction or gangrene: Secondary | ICD-10-CM | POA: Diagnosis not present

## 2017-05-12 DIAGNOSIS — R531 Weakness: Secondary | ICD-10-CM | POA: Diagnosis not present

## 2017-05-12 DIAGNOSIS — R5383 Other fatigue: Secondary | ICD-10-CM | POA: Diagnosis present

## 2017-05-12 LAB — URINALYSIS, ROUTINE W REFLEX MICROSCOPIC
BACTERIA UA: NONE SEEN
BILIRUBIN URINE: NEGATIVE
Glucose, UA: NEGATIVE mg/dL
Hgb urine dipstick: NEGATIVE
KETONES UR: NEGATIVE mg/dL
NITRITE: NEGATIVE
PROTEIN: NEGATIVE mg/dL
SQUAMOUS EPITHELIAL / LPF: NONE SEEN
Specific Gravity, Urine: 1.016 (ref 1.005–1.030)
pH: 7 (ref 5.0–8.0)

## 2017-05-12 LAB — COMPREHENSIVE METABOLIC PANEL
ALBUMIN: 3.5 g/dL (ref 3.5–5.0)
ALT: 30 U/L (ref 14–54)
ANION GAP: 7 (ref 5–15)
AST: 43 U/L — ABNORMAL HIGH (ref 15–41)
Alkaline Phosphatase: 93 U/L (ref 38–126)
BUN: 8 mg/dL (ref 6–20)
CHLORIDE: 102 mmol/L (ref 101–111)
CO2: 29 mmol/L (ref 22–32)
Calcium: 9.5 mg/dL (ref 8.9–10.3)
Creatinine, Ser: 0.53 mg/dL (ref 0.44–1.00)
GFR calc Af Amer: >60 mL/min (ref >60)
GFR calc non Af Amer: >60 mL/min (ref >60)
Glucose, Bld: 109 mg/dL — ABNORMAL HIGH (ref 65–99)
POTASSIUM: 4.4 mmol/L (ref 3.5–5.1)
Sodium: 138 mmol/L (ref 135–145)
TOTAL PROTEIN: 6.4 g/dL — AB (ref 6.5–8.1)
Total Bilirubin: 0.5 mg/dL (ref 0.3–1.2)

## 2017-05-12 LAB — CBC
HEMATOCRIT: 39.6 % (ref 36.0–46.0)
HEMOGLOBIN: 13.1 g/dL (ref 12.0–15.0)
MCH: 26.9 pg (ref 26.0–34.0)
MCHC: 33.1 g/dL (ref 30.0–36.0)
MCV: 81.3 fL (ref 78.0–100.0)
Platelets: 288 10*3/uL (ref 150–400)
RBC: 4.87 MIL/uL (ref 3.87–5.11)
RDW: 14.6 % (ref 11.5–15.5)
WBC: 7.9 10*3/uL (ref 4.0–10.5)

## 2017-05-12 LAB — LIPASE, BLOOD: LIPASE: 39 U/L (ref 11–51)

## 2017-05-12 MED ORDER — MORPHINE SULFATE (PF) 4 MG/ML IV SOLN
2.0000 mg | Freq: Once | INTRAVENOUS | Status: AC
  Administered 2017-05-12: 2 mg via INTRAVENOUS
  Filled 2017-05-12: qty 1

## 2017-05-12 MED ORDER — IOPAMIDOL (ISOVUE-300) INJECTION 61%
INTRAVENOUS | Status: AC
  Administered 2017-05-12: 100 mL
  Filled 2017-05-12: qty 100

## 2017-05-12 NOTE — ED Notes (Signed)
Patient stated she is always hard to obtain blood or and IV. EMS and nurse attempted x2 unable to obtain.

## 2017-05-12 NOTE — ED Notes (Signed)
Katie RN given report

## 2017-05-12 NOTE — ED Notes (Signed)
Lab called blood for CMP and lipase is hemolyzed. Notified phlebotomy to draw blood and notified doctor.

## 2017-05-12 NOTE — ED Notes (Signed)
Pt provided with [place] sandwich bag and coke while waiting for ride.  Pure wick applied, but leaked, peri care done

## 2017-05-12 NOTE — Discharge Instructions (Signed)
Return to the ED with any concerns including worsening pain, fever/chills, vomiting, blood in stool, decreased level of alertness/lethargy, or any other alarming symptoms

## 2017-05-12 NOTE — ED Provider Notes (Signed)
[facility] EMERGENCY DEPARTMENT Provider Note   CSN: [id number] Arrival date & time: 05/12/17  8101     History   Chief Complaint Chief Complaint  Patient presents with  . Fatigue    HPI Katherine Owen is a 83 y.o. female.  HPI  Pt presenting with c/o pain in left abdomen/side.  She has been taking amoxicillin,flagyl,clarithromycin for the past 2 weeks- she is at the end of her course.  She states that over the past week she has been having pain which has been worsening.  She had one dry heave yestererday.  Pain is constant.  Worse with palpation.  She has still been able to drink liquids well.  No fever/chills.  She is incontinent at baseline- not aware of any dysuria.  Antibiotics were for belching.  There are no other associated systemic symptoms, there are no other alleviating or modifying factors.   Past Medical History:  Diagnosis Date  . Allergy   . Arthritis   . Cataract    Bilateral  . Essential hypertension 05/02/2014  . Headache   . History of shingles   . HLD (hyperlipidemia) 05/02/2014  . Hyperlipidemia   . Hypertension   . Neuropathic pain 05/02/2014  . Osteoarthritis of both shoulders due to rotator cuff injury 04/23/2016  . Poor mobility 08/28/2015  . 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 Spinal stenosis of lumbar region 05/02/2014   S/p decompression at [facility] November 2015     Patient Active Problem List   Diagnosis Date Noted  . Pain of left heel 04/08/2017  . Dyspepsia 03/18/2017  . Left shoulder pain 07/02/2016  . Cervical disc disorder with radiculopathy of cervical region 07/02/2016  . Osteoarthritis of both shoulders due to rotator cuff injury 04/23/2016  . 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 Poor mobility 08/28/2015  . Physical exam 10/27/2014  . Leg wound, left 06/29/2014  . Leg ulcer ([place]) 05/02/2014  . HLD (hyperlipidemia) 05/02/2014  . CN (constipation) 05/02/2014  . Spinal stenosis of  lumbar region 05/02/2014  . Rhinitis, allergic 05/02/2014  . Neuropathic pain 05/02/2014  . Essential hypertension 05/02/2014    Past Surgical History:  Procedure Laterality Date  . ANKLE SURGERY Left 2014  . BACK SURGERY  2015   [facility]   . BUNIONECTOMY Right   . COLONOSCOPY    . EYE SURGERY     cataract  . REVERSE SHOULDER ARTHROPLASTY Right 01/11/2016   Performed by Justice Britain, MD at [place]  01/11/2016   REVERSE SHOULDER ARTHROPLASTY on 01/11/2016  . SPINE SURGERY     done at [facility] Apr 26, 2014, lumbar decompression  . TOTAL HIP ARTHROPLASTY Bilateral 2005, 2009   [place] , [place]    OB History    No data available       Home Medications    Prior to Admission medications   Medication Sig Start Date End Date Taking? Authorizing Provider  amoxicillin (AMOXIL) 500 MG capsule Take 1,000 mg 2 (two) times daily by mouth. 04/22/17  Yes [provider]  atorvastatin (LIPITOR) 10 MG tablet Take 1 tablet (10 mg total) by mouth at bedtime. For high cholesterol 04/22/17  Yes Roma Schanz R, DO  celecoxib (CELEBREX) 200 MG capsule Take 1 capsule (200 mg total) by mouth daily. 06/03/16  Yes Ann Held, DO  Cholecalciferol (VITAMIN D3) 5000 units CAPS Take 1 capsule by mouth at bedtime.   Yes [provider]  clarithromycin (BIAXIN) 500 MG tablet Take 1 tablet (500 mg total) by mouth 2 (two) times daily. 04/22/17  Yes Milus Banister, MD  famotidine (PEPCID) 10 MG tablet Chew 10 mg at bedtime as needed by mouth for heartburn.   Yes [provider]  fluticasone (FLONASE) 50 MCG/ACT nasal spray Place 1 spray into both nostrils as needed for allergies or rhinitis.   Yes [provider]  gabapentin (NEURONTIN) 100 MG capsule Take 1 capsule (100 mg total) by mouth 3 (three) times daily. 04/22/17 04/22/18 Yes Roma Schanz R, DO  lidocaine (XYLOCAINE) 4 % external solution Apply topically daily as needed.   Yes  [provider]  losartan-hydrochlorothiazide (HYZAAR) 100-12.5 MG tablet Take 1 tablet by mouth daily. 12/27/16  Yes Roma Schanz R, DO  metroNIDAZOLE (FLAGYL) 500 MG tablet Take 500 mg 3 (three) times daily by mouth. 04/17/17  Yes [provider]  montelukast (SINGULAIR) 10 MG tablet Take 1 tablet (10 mg total) by mouth at bedtime. 02/06/17  Yes Ann Held, DO  Multiple Vitamins-Minerals (CENTRUM SILVER) tablet Take 1 tablet by mouth daily.   Yes [provider]  pantoprazole (PROTONIX) 40 MG tablet Take 1 tablet (40 mg total) by mouth daily. 04/15/17  Yes Milus Banister, MD  senna-docusate (SENOKOT-S) 8.6-50 MG per tablet Take 2 tablets by mouth 2 (two) times daily. For constipation    Yes [provider]  Simethicone (GAS RELIEF 80 PO) Take 1 tablet daily as needed by mouth (for bloating).   Yes [provider]  traMADol (ULTRAM) 50 MG tablet Take 1 tablet (50 mg total) by mouth 3 (three) times daily. 04/25/17  Yes Roma Schanz R, DO  Bismuth Subsalicylate 854 MG TABS Take 2 tablets (524 mg total) by mouth 4 (four) times daily. Patient not taking: Reported on 05/12/2017 04/17/17   Milus Banister, MD  Misc. Devices [facility]) MISC Use as directed 05/02/17   Carollee Herter, [name], DO  Omeprazole 20 MG TBDD Take 20 mg by mouth at bedtime. Patient not taking: Reported on 05/12/2017 04/17/17   Milus Banister, MD  tetracycline (ACHROMYCIN,SUMYCIN) 500 MG capsule Take 1 capsule (500 mg total) by mouth 4 (four) times daily. Patient not taking: Reported on 05/12/2017 04/17/17   Milus Banister, MD    Family History Family History  Problem Relation Age of Onset  . Arthritis Mother        osteoarthritis  . Colon cancer Mother   . Arthritis Father        osteoarthritis  . Parkinson's disease Father   . Anesthesia problems Neg Hx     Social History Social History   Tobacco Use  . Smoking status: Former Smoker     Packs/day: 1.00    Years: 20.00    Pack years: 20.00    Types: Cigarettes    Last attempt to quit: 04/16/1967    Years since quitting: 50.1  . Smokeless tobacco: Never Used  . Tobacco comment: quit somking in early  1970's  Substance Use Topics  . Alcohol use: No    Alcohol/week: 0.0 oz  . Drug use: No     Allergies   No known allergies   Review of Systems Review of Systems  ROS reviewed and all otherwise negative except for mentioned in HPI   Physical Exam Updated Vital Signs BP (!) 165/89   Pulse 96   Temp 98 F (36.7 C) (Oral)  Resp 20   Ht 5\' 4"  (1.626 m)   Wt 74.4 kg (164 lb)   SpO2 100%   BMI 28.15 kg/m  Vitals reviewed Physical Exam  Physical Examination: General appearance - alert, well appearing, and in no distress Mental status - alert, oriented to person, place, and time Eyes - no conjunctival injection, no scleral icterus Mouth - mucous membranes moist, pharynx normal without lesions Neck - supple, no significant adenopathy Chest - clear to auscultation, no wheezes, rales or rhonchi, symmetric air entry Heart - normal rate, regular rhythm, normal S1, S2, no murmurs, rubs, clicks or gallops Abdomen - soft, ttp over left sided/mid abdomen, no gaurding or rebound, nondistended, no masses or organomegaly Neurological - alert, oriented, normal speech Extremities - peripheral pulses normal, no pedal edema, no clubbing or cyanosis Skin - normal coloration and turgor, no rashes   ED Treatments / Results  Labs (all labs ordered are listed, but only abnormal results are displayed) Labs Reviewed  URINALYSIS, ROUTINE W REFLEX MICROSCOPIC - Abnormal; Notable for the following components:      Result Value   Leukocytes, UA TRACE (*)    All other components within normal limits  COMPREHENSIVE METABOLIC PANEL - Abnormal; Notable for the following components:   Glucose, Bld 109 (*)    Total Protein 6.4 (*)    AST 43 (*)    All other components within normal  limits  CBC  LIPASE, BLOOD    EKG  EKG Interpretation  Date/Time:  Monday May 12 2017 09:13:15 EST Ventricular Rate:  86 PR Interval:    QRS Duration: 148 QT Interval:  428 QTC Calculation: 512 R Axis:   -13 Text Interpretation:  Sinus rhythm IVCD, consider atypical LBBB No old tracing to compare Confirmed by Alfonzo Beers [phone]) on 05/12/2017 9:18:14 AM       Radiology Ct Abdomen Pelvis W Contrast  Result Date: 05/12/2017 CLINICAL DATA:  Left-sided abdominal pain for 3 months. EXAM: CT ABDOMEN AND PELVIS WITH CONTRAST TECHNIQUE: Multidetector CT imaging of the abdomen and pelvis was performed using the standard protocol following bolus administration of intravenous contrast. CONTRAST:  179mL ISOVUE-300 IOPAMIDOL (ISOVUE-300) INJECTION 61% COMPARISON:  None. FINDINGS: Lower chest: No acute abnormality. Hepatobiliary: No focal liver abnormality is seen. No gallstones, gallbladder wall thickening, or biliary dilatation. Pancreas: Unremarkable. No pancreatic ductal dilatation or surrounding inflammatory changes. Spleen: Normal in size without focal abnormality. Adrenals/Urinary Tract: Adrenal glands are unremarkable. Bilateral renal cysts are noted. No hydronephrosis or renal obstruction is noted. No renal or ureteral calculi are noted. Urinary bladder is unremarkable. Stomach/Bowel: Small sliding-type hiatal hernia is noted. Otherwise stomach is unremarkable. No evidence of bowel obstruction or inflammation. Stool is noted throughout the colon. The appendix appears normal. Vascular/Lymphatic: Aortic atherosclerosis. No enlarged abdominal or pelvic lymph nodes. Reproductive: Multiple calcified uterine fibroids are noted. No adnexal abnormality is noted. Other: No abdominal wall hernia or abnormality. No abdominopelvic ascites. Musculoskeletal: Status post bilateral total hip arthroplasties. Multilevel degenerative disc disease is noted in the lumbar spine. IMPRESSION: Small sliding-type  hiatal hernia. Aortic atherosclerosis. Multiple calcified uterine fibroids. No acute abnormality seen in the abdomen or pelvis. Electronically Signed   By: Marijo Conception, M.D.   On: 05/12/2017 13:51    Procedures Procedures (including critical care time)  Medications Ordered in ED Medications  morphine 4 MG/ML injection 2 mg (2 mg Intravenous Given 05/12/17 1022)  iopamidol (ISOVUE-300) 61 % injection (100 mLs  Contrast Given 05/12/17 1329)  Initial Impression / Assessment and Plan / ED Course  I have reviewed the triage vital signs and the nursing notes.  Pertinent labs & imaging results that were available during my care of the patient were reviewed by me and considered in my medical decision making (see chart for details).     Pt presenting with c/o left sided abdominal pain. She is finishing 2 week course of amoxicillin, clarithromycin, metronidazole- labs and CT scan obtained and show no acute findings.  Discussed all results with patient and family at bedside, advised close followup with PMD.  Discharged with strict return precautions.  Pt agreeable with plan.  Final Clinical Impressions(s) / ED Diagnoses   Final diagnoses:  Abdominal pain, unspecified abdominal location    ED Discharge Orders    None       Pixie Casino, MD 05/12/17 1530

## 2017-05-12 NOTE — ED Notes (Signed)
EKG completed given to EDP.  

## 2017-05-12 NOTE — ED Notes (Signed)
IV team at bedside 

## 2017-05-12 NOTE — ED Notes (Signed)
Spoke with CT stated patient is next for CT notified patient and family member at bedside.

## 2017-05-12 NOTE — ED Notes (Signed)
Pt assisted with this RN and [place], RN to wheelchair and covered for private transport.  Son wheeled patient to lobby for private pickup.  Will d/c.

## 2017-05-12 NOTE — ED Triage Notes (Signed)
Arrived via EMS patient patient started antibiotics 14 days ago for an infection in her stomach. For the past 2 days developed nausea and and emesis x 1 yesterday with left sided pain abdominal pain and general weakness. Today is the last day of antibiotics.

## 2017-05-13 ENCOUNTER — Telehealth: Payer: Self-pay | Admitting: Gastroenterology

## 2017-05-13 NOTE — Telephone Encounter (Signed)
The pt finished her abx yesterday and states she was very nauseous all day.  She is better today.  I advised her as long as she continues to improve she would not need to make any changes.  If she begins to decline she will call back.  She will go to the ED if her nausea worsens over the holidays.

## 2017-05-19 DIAGNOSIS — M19012 Primary osteoarthritis, left shoulder: Secondary | ICD-10-CM | POA: Diagnosis not present

## 2017-05-19 DIAGNOSIS — M25512 Pain in left shoulder: Secondary | ICD-10-CM | POA: Diagnosis not present

## 2017-05-19 DIAGNOSIS — G8929 Other chronic pain: Secondary | ICD-10-CM | POA: Diagnosis not present

## 2017-05-27 ENCOUNTER — Telehealth: Payer: Self-pay | Admitting: Family Medicine

## 2017-05-27 DIAGNOSIS — I10 Essential (primary) hypertension: Secondary | ICD-10-CM

## 2017-05-27 MED ORDER — MONTELUKAST SODIUM 10 MG PO TABS: 10.0000 mg | ORAL_TABLET | Freq: Every day | ORAL | 1 refills | Status: DC

## 2017-05-27 MED ORDER — LOSARTAN POTASSIUM-HCTZ 100-12.5 MG PO TABS: 1.0000 | ORAL_TABLET | Freq: Every day | ORAL | 1 refills | Status: DC

## 2017-05-27 NOTE — Telephone Encounter (Signed)
Copied from [place] [phone]. Topic: Quick Communication - Rx Refill/Question >> May 27, 2017  1:44 PM Wynetta Emery, [place] C wrote: Self.   Refill request for Losartan and also Montelukast  --- 90 day supply.   Pharmacy: [facility] - [place], [place] - [phone] [place]   Agent: Please be advised that RX refills may take up to 3 business days. We ask that you follow-up with your pharmacy.

## 2017-05-27 NOTE — Telephone Encounter (Signed)
Patient notified that rx was sent.

## 2017-05-29 ENCOUNTER — Other Ambulatory Visit: Payer: Self-pay

## 2017-06-05 ENCOUNTER — Telehealth: Payer: Self-pay | Admitting: Family Medicine

## 2017-06-05 NOTE — Telephone Encounter (Signed)
Copied from [place]. Topic: Quick Communication - See Telephone Encounter >> Jun 05, 2017 12:11 PM Corie Chiquito, [place] wrote: CRM for notification. See Telephone encounter for: Patient called because she needs a refill on her Celebrex. Patient also stated that it needs to be ordered from [facility]. If someone could please give her a call back about this at [phone]  06/05/17.

## 2017-06-06 NOTE — Telephone Encounter (Signed)
I talked to pt. And she reports Dr. Etter Sjogren helps her get her Celebrex from [place]. Thanks.

## 2017-06-06 NOTE — Telephone Encounter (Signed)
Per review of Epic, assistance was approved through 02/18/17. New application will need to be completed and Rx submitted along with application and pt's financial documents. Forms downloaded and forwarded to PCP for Rx completion.

## 2017-06-09 NOTE — Telephone Encounter (Signed)
Patient son will bring by social security statement.  Also she will try to print out the forms that we need signed so he could bring them to.  Forms are on [employer]

## 2017-06-12 ENCOUNTER — Telehealth: Payer: Self-pay | Admitting: Family Medicine

## 2017-06-12 NOTE — Telephone Encounter (Signed)
Copied from [place] [id number]. Topic: Quick Communication - See Telephone Encounter >> Jun 12, 2017 11:49 AM Rosalin Hawking wrote: CRM for notification. See Telephone encounter for:  06/12/17.   Pt's family member dropped off document to be filled out [employer] Patient Assistance Program documents in a yellow big envelope) Pt would like to be called when document ready to pick up at [phone]. Document put at front office tray.

## 2017-06-18 NOTE — Telephone Encounter (Signed)
Duplicate forms with Federal Tax copies enclosed received; forwarded to provider's MA, as these appear to be second set of forms received [06/06/17]/SLS 12/26

## 2017-06-25 NOTE — Telephone Encounter (Signed)
Pt calling for the status on the paperwork that was dropped off. Please f/u with pt.

## 2017-06-25 NOTE — Telephone Encounter (Signed)
Paperwork sent last week. Patient notified.  She call them to check status.

## 2017-06-26 NOTE — Telephone Encounter (Signed)
Pt notified that rx is ready for pickup.  celebrex is at front for pickup.

## 2017-07-01 NOTE — Telephone Encounter (Signed)
Received fax via [employer] Patient Assistance Program for Enrollment until 06/20/18 for Celebrex #90. For reorder call [phone] and follow the appropriate prompts, or go to [email or web address] and place reorder via prescriber portal. Any questions, call [phone], Monday thru Friday, from 8:00am to 6:00pm ET; forwarded to provider/SLS 01/08

## 2017-07-10 ENCOUNTER — Ambulatory Visit: Payer: Self-pay

## 2017-07-10 NOTE — Telephone Encounter (Signed)
Patient called in for feet swelling. She says "they have been swelling for the past 2-3 months." I asked are they painful, red, localized at the feet. She says "they are not painful, maybe a little red, but not bad, swelling is only in my feet and very little to my ankles."  I asked her to describe how bad the swelling is since she can't press her finger into her feet. I asked based on her shoes, is the swelling so bad she can't wear closed in shoes but has to wear slip in shoes, she said "I started wearing slip in shoes in November. I was able to tie my shoes, but now I just wear the slip in shoes. I wonder if my medicine is not working now." She denies SOB, pain in legs, chest pain, fever. According to protocol, see PCP in 2 weeks, appointment made for Thursday, 07/17/17, care advice given, patient verbalized understanding and said if this doesn't work with her ride, she will call to re-schedule.   Reason for Disposition . [1] MILD swelling of both ankles (i.e., pedal edema) AND [2] is a chronic symptom (recurrent or ongoing AND present > 4 weeks)  Answer Assessment - Initial Assessment Questions 1. ONSET: "When did the swelling start?" (e.g., minutes, hours, days)     Feet swelling x 2-3 months 2. LOCATION: "What part of the leg is swollen?"  "Are both feet swollen or just one foot?"     Both feet 3. SEVERITY: "How bad is the swelling?" (e.g., localized; mild, moderate, severe)  - Localized - small area of swelling localized to one leg  - MILD pedal edema - swelling limited to foot and ankle, pitting edema < 1/4 inch (6 mm) deep, rest and elevation eliminate most or all swelling  - MODERATE edema - swelling of lower leg to knee, pitting edema > 1/4 inch (6 mm) deep, rest and elevation only partially reduce swelling  - SEVERE edema - swelling extends above knee, facial or hand swelling present      Mild-Moderate 4. REDNESS: "Does the swelling look red or infected?"     A little red 5. PAIN: "Is  the swelling painful to touch?" If so, ask: "How painful is it?"   (Scale 1-10; mild, moderate or severe)     Denies 6. FEVER: "Do you have a fever?" If so, ask: "What is it, how was it measured, and when did it start?"      No 7. CAUSE: "What do you think is causing the feet swelling?"     No idea 8. MEDICAL HISTORY: "Do you have a history of heart failure, kidney disease, liver failure, or cancer?"     No. Just HTN 9. RECURRENT SYMPTOM: "Have you had feet swelling before?" If so, ask: "When was the last time?" "What happened that time?"     A little bit off and on, but always go down 10. OTHER SYMPTOMS: "Do you have any other symptoms?" (e.g., chest pain, difficulty breathing)       Denies 11. PREGNANCY: "Is there any chance you are pregnant?" "When was your last menstrual period?"      No  Protocols used: LEG SWELLING AND EDEMA-A-AH

## 2017-07-17 ENCOUNTER — Ambulatory Visit: Payer: Medicare Other | Admitting: Family Medicine

## 2017-07-18 ENCOUNTER — Ambulatory Visit (HOSPITAL_BASED_OUTPATIENT_CLINIC_OR_DEPARTMENT_OTHER)
Admission: RE | Admit: 2017-07-18 | Discharge: 2017-07-18 | Disposition: A | Discharge status: Home / Self Care | Payer: Medicare Other | Source: Ambulatory Visit | Attending: Family Medicine | Admitting: Family Medicine

## 2017-07-18 ENCOUNTER — Encounter: Payer: Self-pay | Admitting: Family Medicine

## 2017-07-18 ENCOUNTER — Ambulatory Visit (INDEPENDENT_AMBULATORY_CARE_PROVIDER_SITE_OTHER): Payer: Medicare Other | Admitting: Family Medicine

## 2017-07-18 VITALS — BP 136/86 | HR 98 | Temp 97.9°F | Resp 16

## 2017-07-18 DIAGNOSIS — M7989 Other specified soft tissue disorders: Secondary | ICD-10-CM | POA: Diagnosis not present

## 2017-07-18 DIAGNOSIS — M159 Polyosteoarthritis, unspecified: Secondary | ICD-10-CM

## 2017-07-18 DIAGNOSIS — R600 Localized edema: Secondary | ICD-10-CM | POA: Diagnosis not present

## 2017-07-18 DIAGNOSIS — I700 Atherosclerosis of aorta: Secondary | ICD-10-CM | POA: Insufficient documentation

## 2017-07-18 DIAGNOSIS — M48061 Spinal stenosis, lumbar region without neurogenic claudication: Secondary | ICD-10-CM | POA: Diagnosis not present

## 2017-07-18 DIAGNOSIS — I10 Essential (primary) hypertension: Secondary | ICD-10-CM | POA: Diagnosis not present

## 2017-07-18 DIAGNOSIS — M150 Primary generalized (osteo)arthritis: Secondary | ICD-10-CM | POA: Diagnosis not present

## 2017-07-18 MED ORDER — LOSARTAN POTASSIUM 100 MG PO TABS: 100.0000 mg | ORAL_TABLET | Freq: Every day | ORAL | 1 refills | Status: DC

## 2017-07-18 MED ORDER — FUROSEMIDE 20 MG PO TABS: 20.0000 mg | ORAL_TABLET | Freq: Every day | ORAL | 3 refills | Status: DC

## 2017-07-18 MED ORDER — LOSARTAN POTASSIUM 100 MG PO TABS: 100.0000 mg | ORAL_TABLET | Freq: Every day | ORAL | 3 refills | Status: DC

## 2017-07-18 MED ORDER — TRAMADOL HCL 50 MG PO TABS: 50.0000 mg | ORAL_TABLET | Freq: Three times a day (TID) | ORAL | 1 refills | Status: DC

## 2017-07-18 MED ORDER — NONFORMULARY OR COMPOUNDED ITEM: 0 refills | Status: AC

## 2017-07-18 MED ORDER — FUROSEMIDE 20 MG PO TABS: 20.0000 mg | ORAL_TABLET | Freq: Every day | ORAL | 1 refills | Status: DC

## 2017-07-18 NOTE — Patient Instructions (Signed)
Edema Edema is an abnormal buildup of fluids in your bodytissues. Edema is somewhatdependent on gravity to pull the fluid to the lowest place in your body. That makes the condition more common in the legs and thighs (lower extremities). Painless swelling of the feet and ankles is common and becomes more likely as you get older. It is also common in looser tissues, like around your eyes. When the affected area is squeezed, the fluid may move out of that spot and leave a dent for a few moments. This dent is called pitting. What are the causes? There are many possible causes of edema. Eating too much salt and being on your feet or sitting for a long time can cause edema in your legs and ankles. Hot weather may make edema worse. Common medical causes of edema include:  Heart failure.  Liver disease.  Kidney disease.  Weak blood vessels in your legs.  Cancer.  An injury.  Pregnancy.  Some medications.  Obesity.  What are the signs or symptoms? Edema is usually painless.Your skin may look swollen or shiny. How is this diagnosed? Your health care provider may be able to diagnose edema by asking about your medical history and doing a physical exam. You may need to have tests such as X-rays, an electrocardiogram, or blood tests to check for medical conditions that may cause edema. How is this treated? Edema treatment depends on the cause. If you have heart, liver, or kidney disease, you need the treatment appropriate for these conditions. General treatment may include:  Elevation of the affected body part above the level of your heart.  Compression of the affected body part. Pressure from elastic bandages or support stockings squeezes the tissues and forces fluid back into the blood vessels. This keeps fluid from entering the tissues.  Restriction of fluid and salt intake.  Use of a water pill (diuretic). These medications are appropriate only for some types of edema. They pull fluid  out of your body and make you urinate more often. This gets rid of fluid and reduces swelling, but diuretics can have side effects. Only use diuretics as directed by your health care provider.  Follow these instructions at home:  Keep the affected body part above the level of your heart when you are lying down.  Do not sit still or stand for prolonged periods.  Do not put anything directly under your knees when lying down.  Do not wear constricting clothing or garters on your upper legs.  Exercise your legs to work the fluid back into your blood vessels. This may help the swelling go down.  Wear elastic bandages or support stockings to reduce ankle swelling as directed by your health care provider.  Eat a low-salt diet to reduce fluid if your health care provider recommends it.  Only take medicines as directed by your health care provider. Contact a health care provider if:  Your edema is not responding to treatment.  You have heart, liver, or kidney disease and notice symptoms of edema.  You have edema in your legs that does not improve after elevating them.  You have sudden and unexplained weight gain. Get help right away if:  You develop shortness of breath or chest pain.  You cannot breathe when you lie down.  You develop pain, redness, or warmth in the swollen areas.  You have heart, liver, or kidney disease and suddenly get edema.  You have a fever and your symptoms suddenly get worse. This information is   not intended to replace advice given to you by your health care provider. Make sure you discuss any questions you have with your health care provider. Document Released: 06/10/2005 Document Revised: 11/16/2015 Document Reviewed: 04/02/2013 Elsevier Interactive Patient Education  2017 Elsevier Inc.  

## 2017-07-18 NOTE — Assessment & Plan Note (Signed)
Elevate legs Check cxr Lasix 20 mg qam

## 2017-07-18 NOTE — Progress Notes (Signed)
Patient ID: Katherine Owen, female   DOB: 03-Jan-1928, 84 y.o.   MRN: [id number]     Subjective:  I acted as a Education administrator for Dr. Carollee Herter.  Guerry Bruin, [place]   Patient ID: Katherine Owen, female    DOB: [birth year], 84 y.o.   MRN: [id number]  Chief Complaint  Patient presents with  . Edema    HPI  Patient is in today for swelling in both her feet.  It has been going on and off for about a month.  No sob, no chest pain.  Pt sits all day in her wheelchair.    Patient Care Team: Carollee Herter, [name], DO as PCP - General (Family Medicine) Burnell Blanks, MD as Consulting Physician (Cardiology) Justice Britain, MD as Consulting Physician (Orthopedic Surgery) Manson Passey, MD as Referring Physician (Neurosurgery)   Past Medical History:  Diagnosis Date  . Allergy   . Arthritis   . Cataract    Bilateral  . Essential hypertension 05/02/2014  . Headache   . History of shingles   . HLD (hyperlipidemia) 05/02/2014  . Hyperlipidemia   . Hypertension   . Neuropathic pain 05/02/2014  . Osteoarthritis of both shoulders due to rotator cuff injury 04/23/2016  . Poor mobility 08/28/2015  . 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 Spinal stenosis of lumbar region 05/02/2014   S/p decompression at [facility] November 2015     Past Surgical History:  Procedure Laterality Date  . ANKLE SURGERY Left 2014  . BACK SURGERY  2015   [facility]   . BUNIONECTOMY Right   . COLONOSCOPY    . EYE SURGERY     cataract  . REVERSE SHOULDER ARTHROPLASTY Right 01/11/2016   Procedure: REVERSE SHOULDER ARTHROPLASTY;  Surgeon: Justice Britain, MD;  Location: [facility];  Service: Orthopedics;  Laterality: Right;  . SHOULDER SURGERY  01/11/2016   REVERSE SHOULDER ARTHROPLASTY on 01/11/2016  . SPINE SURGERY     done at [facility] Apr 26, 2014, lumbar decompression  . TOTAL HIP ARTHROPLASTY Bilateral 2005, 2009   [place] , [place]    Family History  Problem Relation Age of Onset  . Arthritis Mother        osteoarthritis    . Colon cancer Mother   . Arthritis Father        osteoarthritis  . Parkinson's disease Father   . Anesthesia problems Neg Hx     Social History   Socioeconomic History  . Marital status: Widowed    Spouse name: Not on file  . Number of children: 3  . Years of education: Not on file  . Highest education level: Not on file  Social Needs  . Financial resource strain: Not on file  . Food insecurity - worry: Not on file  . Food insecurity - inability: Not on file  . Transportation needs - medical: Not on file  . Transportation needs - non-medical: Not on file  Occupational History  . 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 Smoking status: Former Smoker    Packs/day: 1.00    Years: 20.00    Pack years: 20.00    Types: Cigarettes    Last attempt to quit: 04/16/1967    Years since quitting: 50.2  . Smokeless tobacco: Never Used  . Tobacco comment: quit somking in early  1970's  Substance and Sexual Activity  . Alcohol use: No    Alcohol/week: 0.0 oz  . Drug use: No  . Sexual activity: Not Currently  Other  Topics Concern  . Not on file  Social History Narrative  . Not on file    Outpatient Medications Prior to Visit  Medication Sig Dispense Refill  . atorvastatin (LIPITOR) 10 MG tablet Take 1 tablet (10 mg total) by mouth at bedtime. For high cholesterol 90 tablet 1  . Bismuth Subsalicylate 188 MG TABS Take 2 tablets (524 mg total) by mouth 4 (four) times daily. 112 each 0  . celecoxib (CELEBREX) 200 MG capsule Take 1 capsule (200 mg total) by mouth daily. 100 capsule 0  . Cholecalciferol (VITAMIN D3) 5000 units CAPS Take 1 capsule by mouth at bedtime.    . famotidine (PEPCID) 10 MG tablet Chew 10 mg at bedtime as needed by mouth for heartburn.    . fluticasone (FLONASE) 50 MCG/ACT nasal spray Place 1 spray into both nostrils as needed for allergies or rhinitis.    Marland Kitchen gabapentin (NEURONTIN) 100 MG capsule Take 1 capsule (100 mg total) by mouth 3  (three) times daily. 270 capsule 1  . lidocaine (XYLOCAINE) 4 % external solution Apply topically daily as needed.    . metroNIDAZOLE (FLAGYL) 500 MG tablet Take 500 mg 3 (three) times daily by mouth.  0  . Misc. Devices [facility]) MISC Use as directed 1 each 0  . montelukast (SINGULAIR) 10 MG tablet Take 1 tablet (10 mg total) by mouth at bedtime. 90 tablet 1  . Multiple Vitamins-Minerals (CENTRUM SILVER) tablet Take 1 tablet by mouth daily.    . Omeprazole 20 MG TBDD Take 20 mg by mouth at bedtime. 14 tablet 0  . pantoprazole (PROTONIX) 40 MG tablet Take 1 tablet (40 mg total) by mouth daily. 90 tablet 3  . senna-docusate (SENOKOT-S) 8.6-50 MG per tablet Take 2 tablets by mouth 2 (two) times daily. For constipation     . Simethicone (GAS RELIEF 80 PO) Take 1 tablet daily as needed by mouth (for bloating).    Marland Kitchen losartan-hydrochlorothiazide (HYZAAR) 100-12.5 MG tablet Take 1 tablet by mouth daily. 90 tablet 1  . traMADol (ULTRAM) 50 MG tablet Take 1 tablet (50 mg total) by mouth 3 (three) times daily. 270 tablet 0  . amoxicillin (AMOXIL) 500 MG capsule Take 1,000 mg 2 (two) times daily by mouth.  0  . clarithromycin (BIAXIN) 500 MG tablet Take 1 tablet (500 mg total) by mouth 2 (two) times daily. 28 tablet 0  . tetracycline (ACHROMYCIN,SUMYCIN) 500 MG capsule Take 1 capsule (500 mg total) by mouth 4 (four) times daily. (Patient not taking: Reported on 05/12/2017) 56 capsule 0   No facility-administered medications prior to visit.     Allergies  Allergen Reactions  . No Known Allergies     Review of Systems  Constitutional: Negative for fever and malaise/fatigue.  HENT: Negative for congestion.   Eyes: Negative for blurred vision.  Respiratory: Negative for cough and shortness of breath.   Cardiovascular: Negative for chest pain, palpitations and leg swelling.       Swelling both feet   Gastrointestinal: Negative for vomiting.  Musculoskeletal: Negative for back pain.  Skin:  Negative for rash.  Neurological: Negative for loss of consciousness and headaches.       Objective:    Physical Exam  Constitutional: She is oriented to person, place, and time. She appears well-developed and well-nourished.  HENT:  Head: Normocephalic and atraumatic.  Eyes: Conjunctivae and EOM are normal.  Neck: Normal range of motion. Neck supple. No JVD present. Carotid bruit is not present. No thyromegaly present.  Cardiovascular: Normal rate, regular rhythm and normal heart sounds.  No murmur heard. Pulmonary/Chest: Effort normal and breath sounds normal. No respiratory distress. She has no wheezes. She has no rales. She exhibits no tenderness.  Musculoskeletal: She exhibits edema.  + 1 pitting edema in both feet   Neurological: She is alert and oriented to person, place, and time.  Psychiatric: She has a normal mood and affect.  Nursing note reviewed.   BP 136/86 (BP Location: Left Arm, Cuff Size: Normal)   Pulse 98   Temp 97.9 F (36.6 C) (Oral)   Resp 16   SpO2 100%  Wt Readings from Last 3 Encounters:  05/12/17 164 lb (74.4 kg)  02/06/17 164 lb (74.4 kg)  07/02/16 162 lb (73.5 kg)   BP Readings from Last 3 Encounters:  07/18/17 136/86  05/12/17 (!) 165/89  04/15/17 138/82     Immunization History  Administered Date(s) Administered  . Influenza, High Dose Seasonal PF 04/23/2016, 03/18/2017  . Influenza,inj,Quad PF,6+ Mos 02/23/2015  . Influenza-Unspecified 05/05/2014  . PPD Test 01/14/2016  . Pneumococcal Conjugate-13 06/29/2014  . Pneumococcal Polysaccharide-23 06/29/2012  . Td 06/24/2012    Health Maintenance  Topic Date Due  . DEXA SCAN  11/17/2016  . TETANUS/TDAP  06/24/2022  . INFLUENZA VACCINE  Completed  . PNA vac Low Risk Adult  Completed    Lab Results  Component Value Date   WBC 7.9 05/12/2017   HGB 13.1 05/12/2017   HCT 39.6 05/12/2017   PLT 288 05/12/2017   GLUCOSE 109 (H) 05/12/2017   CHOL 164 03/18/2017   TRIG 150.0 (H)  03/18/2017   HDL 53.00 03/18/2017   LDLCALC 81 03/18/2017   ALT 30 05/12/2017   AST 43 (H) 05/12/2017   NA 138 05/12/2017   K 4.4 05/12/2017   CL 102 05/12/2017   CREATININE 0.53 05/12/2017   BUN 8 05/12/2017   CO2 29 05/12/2017   TSH 1.31 10/27/2014    Lab Results  Component Value Date   TSH 1.31 10/27/2014   Lab Results  Component Value Date   WBC 7.9 05/12/2017   HGB 13.1 05/12/2017   HCT 39.6 05/12/2017   MCV 81.3 05/12/2017   PLT 288 05/12/2017   Lab Results  Component Value Date   NA 138 05/12/2017   K 4.4 05/12/2017   CO2 29 05/12/2017   GLUCOSE 109 (H) 05/12/2017   BUN 8 05/12/2017   CREATININE 0.53 05/12/2017   BILITOT 0.5 05/12/2017   ALKPHOS 93 05/12/2017   AST 43 (H) 05/12/2017   ALT 30 05/12/2017   PROT 6.4 (L) 05/12/2017   ALBUMIN 3.5 05/12/2017   CALCIUM 9.5 05/12/2017   ANIONGAP 7 05/12/2017   GFR 134.36 03/18/2017   Lab Results  Component Value Date   CHOL 164 03/18/2017   Lab Results  Component Value Date   HDL 53.00 03/18/2017   Lab Results  Component Value Date   LDLCALC 81 03/18/2017   Lab Results  Component Value Date   TRIG 150.0 (H) 03/18/2017   Lab Results  Component Value Date   CHOLHDL 3 03/18/2017   No results found for: HGBA1C       Assessment & Plan:   Problem List Items Addressed This Visit      Unprioritized   Essential hypertension   Relevant Medications   furosemide (LASIX) 20 MG tablet   losartan (COZAAR) 100 MG tablet   Lower extremity edema    Elevate legs Check cxr Lasix 20 mg  qam        Relevant Medications   furosemide (LASIX) 20 MG tablet   Other Relevant Orders   DG Chest 2 View   Spinal stenosis of lumbar region - Primary   Relevant Medications   NONFORMULARY OR COMPOUNDED ITEM    Other Visit Diagnoses    Primary osteoarthritis involving multiple joints       Relevant Medications   traMADol (ULTRAM) 50 MG tablet      I have discontinued Kahealani Tisby's tetracycline,  clarithromycin, amoxicillin, and losartan-hydrochlorothiazide. I am also having her start on NONFORMULARY OR COMPOUNDED ITEM. Additionally, I am having her maintain her senna-docusate, CENTRUM SILVER, celecoxib, fluticasone, Vitamin D3, lidocaine, pantoprazole, Omeprazole, Bismuth Subsalicylate, atorvastatin, gabapentin, Wheelchair, metroNIDAZOLE, famotidine, Simethicone (GAS RELIEF 80 PO), montelukast, furosemide, losartan, and traMADol.  Meds ordered this encounter  Medications  . NONFORMULARY OR COMPOUNDED ITEM    Sig: Lift recliner #1  As directed  Dx spinal stenosis, osteoarthritis both shoulder ,  Low ext weakness    Dispense:  1 each    Refill:  0  . DISCONTD: losartan (COZAAR) 100 MG tablet    Sig: Take 1 tablet (100 mg total) by mouth daily.    Dispense:  30 tablet    Refill:  3  . DISCONTD: furosemide (LASIX) 20 MG tablet    Sig: Take 1 tablet (20 mg total) by mouth daily.    Dispense:  30 tablet    Refill:  3  . furosemide (LASIX) 20 MG tablet    Sig: Take 1 tablet (20 mg total) by mouth daily.    Dispense:  90 tablet    Refill:  1  . losartan (COZAAR) 100 MG tablet    Sig: Take 1 tablet (100 mg total) by mouth daily.    Dispense:  90 tablet    Refill:  1  . traMADol (ULTRAM) 50 MG tablet    Sig: Take 1 tablet (50 mg total) by mouth 3 (three) times daily.    Dispense:  270 tablet    Refill:  1    CMA served as scribe during this visit. History, Physical and Plan performed by medical provider. Documentation and orders reviewed and attested to.  Ann Held, DO

## 2017-07-21 ENCOUNTER — Encounter: Payer: Self-pay | Admitting: Podiatry

## 2017-07-21 ENCOUNTER — Ambulatory Visit (INDEPENDENT_AMBULATORY_CARE_PROVIDER_SITE_OTHER): Payer: Medicare Other | Admitting: Podiatry

## 2017-07-21 DIAGNOSIS — M79675 Pain in left toe(s): Secondary | ICD-10-CM

## 2017-07-21 DIAGNOSIS — B351 Tinea unguium: Secondary | ICD-10-CM

## 2017-07-21 DIAGNOSIS — M79674 Pain in right toe(s): Secondary | ICD-10-CM | POA: Diagnosis not present

## 2017-07-21 DIAGNOSIS — H5212 Myopia, left eye: Secondary | ICD-10-CM | POA: Diagnosis not present

## 2017-07-21 DIAGNOSIS — Z961 Presence of intraocular lens: Secondary | ICD-10-CM | POA: Diagnosis not present

## 2017-07-21 DIAGNOSIS — H40013 Open angle with borderline findings, low risk, bilateral: Secondary | ICD-10-CM | POA: Diagnosis not present

## 2017-07-21 DIAGNOSIS — H43813 Vitreous degeneration, bilateral: Secondary | ICD-10-CM | POA: Diagnosis not present

## 2017-07-22 NOTE — Progress Notes (Signed)
Subjective: 84 y.o. returns the office today for painful, elongated, thickened toenails which she cannot trim herself. Denies any redness or drainage around the nails. Her heal pain has resolved. Denies any acute changes since last appointment and no new complaints today. Denies any systemic complaints such as fevers, chills, nausea, vomiting.   Objective: AAO 3, NAD DP/PT pulses palpable, CRT less than 3 seconds Nails hypertrophic, dystrophic, elongated, brittle, discolored 10. There is tenderness overlying the nails 1-5 bilaterally. There is no surrounding erythema or drainage along the nail sites. Ingrowing of her hallux and 2nd digit toenails No open lesions or pre-ulcerative lesions are identified. No other areas of tenderness bilateral lower extremities. No overlying edema, erythema, increased warmth. No pain with calf compression, swelling, warmth, erythema.  Assessment: Patient presents with symptomatic onychomycosis  Plan: -Treatment options including alternatives, risks, complications were discussed -Nails sharply debrided 10 without complication/bleeding. -Discussed shoe changes to avoid pressure. Monitor for any skin breakdown  -Discussed daily foot inspection. If there are any changes, to call the office immediately.  -Follow-up in 3 months or sooner if any problems are to arise. In the meantime, encouraged to call the office with any questions, concerns, changes symptoms.  Celesta Gentile, DPM

## 2017-07-30 ENCOUNTER — Telehealth: Payer: Self-pay | Admitting: Family Medicine

## 2017-07-30 NOTE — Telephone Encounter (Signed)
Copied from [place]. Topic: Quick Communication - See Telephone Encounter >> Jul 30, 2017 11:21 AM Rosalin Hawking wrote: CRM for notification. See Telephone encounter for:  07/30/17.   Pt's son [name]) dropped off document to be filled out by provider ([employer]- 2 pages and a copy of the rx for the lift chair that provider gave to pt). Son indicates the form needs to be filled out so that he can send it to [facility] for a refund for the lift chair. Pt's son would like to be called at [facility] [phone] or [phone] when document ready. Document put at front office tray with providers name.

## 2017-08-01 ENCOUNTER — Encounter: Payer: Self-pay | Admitting: Gastroenterology

## 2017-08-01 ENCOUNTER — Ambulatory Visit (INDEPENDENT_AMBULATORY_CARE_PROVIDER_SITE_OTHER): Payer: Medicare Other | Admitting: Gastroenterology

## 2017-08-01 VITALS — BP 130/78 | HR 78 | Ht 64.5 in

## 2017-08-01 DIAGNOSIS — R1013 Epigastric pain: Secondary | ICD-10-CM | POA: Diagnosis not present

## 2017-08-01 NOTE — Progress Notes (Signed)
Review of pertinent gastrointestinal problems: 1.  Dyspepsia, belching.  Evaluated October 2018 at age 84; 20 pound weight gain over 2 years.  No associated GERD.  Acid suppression helped somewhat.  H. pylori serology was positive and so she was treated with Pylera type regimen.    HPI: This is a very pleasant 84 year old woman whom I last saw 2 or 3 months ago.  She was here with her son again today  Chief complaint is dyspepsia, belching  Her symptoms are nearly completely gone after H. pylori eradication therapy.   ROS: complete GI ROS as described in HPI, all other review negative.  Constitutional:  No unintentional weight loss   Past Medical History:  Diagnosis Date  . Allergy   . Arthritis   . Cataract    Bilateral  . Essential hypertension 05/02/2014  . Headache   . History of shingles   . HLD (hyperlipidemia) 05/02/2014  . Hyperlipidemia   . Hypertension   . Neuropathic pain 05/02/2014  . Osteoarthritis of both shoulders due to rotator cuff injury 04/23/2016  . Poor mobility 08/28/2015  . 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 Spinal stenosis of lumbar region 05/02/2014   S/p decompression at [facility] November 2015     Past Surgical History:  Procedure Laterality Date  . ANKLE SURGERY Left 2014  . BACK SURGERY  2015   [facility]   . BUNIONECTOMY Right   . COLONOSCOPY    . EYE SURGERY     cataract  . REVERSE SHOULDER ARTHROPLASTY Right 01/11/2016   Procedure: REVERSE SHOULDER ARTHROPLASTY;  Surgeon: Justice Britain, MD;  Location: [facility];  Service: Orthopedics;  Laterality: Right;  . SHOULDER SURGERY  01/11/2016   REVERSE SHOULDER ARTHROPLASTY on 01/11/2016  . SPINE SURGERY     done at [facility] Apr 26, 2014, lumbar decompression  . TOTAL HIP ARTHROPLASTY Bilateral 2005, 2009   [place] , [place]    Current Outpatient Medications  Medication Sig Dispense Refill  . atorvastatin (LIPITOR) 10 MG tablet Take 1 tablet (10 mg total) by mouth at bedtime. For high  cholesterol 90 tablet 1  . Bismuth Subsalicylate 790 MG TABS Take 2 tablets (524 mg total) by mouth 4 (four) times daily. 112 each 0  . celecoxib (CELEBREX) 200 MG capsule Take 1 capsule (200 mg total) by mouth daily. 100 capsule 0  . Cholecalciferol (VITAMIN D3) 5000 units CAPS Take 1 capsule by mouth at bedtime.    . famotidine (PEPCID) 10 MG tablet Chew 10 mg at bedtime as needed by mouth for heartburn.    . fluticasone (FLONASE) 50 MCG/ACT nasal spray Place 1 spray into both nostrils as needed for allergies or rhinitis.    . furosemide (LASIX) 20 MG tablet Take 1 tablet (20 mg total) by mouth daily. 90 tablet 1  . gabapentin (NEURONTIN) 100 MG capsule Take 1 capsule (100 mg total) by mouth 3 (three) times daily. 270 capsule 1  . lidocaine (XYLOCAINE) 4 % external solution Apply topically daily as needed.    Marland Kitchen losartan (COZAAR) 100 MG tablet Take 1 tablet (100 mg total) by mouth daily. 90 tablet 1  . metroNIDAZOLE (FLAGYL) 500 MG tablet Take 500 mg 3 (three) times daily by mouth.  0  . Misc. Devices [facility]) MISC Use as directed 1 each 0  . montelukast (SINGULAIR) 10 MG tablet Take 1 tablet (10 mg total) by mouth at bedtime. 90 tablet 1  . Multiple Vitamins-Minerals (CENTRUM SILVER) tablet Take 1 tablet by mouth  daily.    . NONFORMULARY OR COMPOUNDED ITEM Lift recliner #1  As directed  Dx spinal stenosis, osteoarthritis both shoulder ,  Low ext weakness 1 each 0  . Omeprazole 20 MG TBDD Take 20 mg by mouth at bedtime. 14 tablet 0  . pantoprazole (PROTONIX) 40 MG tablet Take 1 tablet (40 mg total) by mouth daily. 90 tablet 3  . senna-docusate (SENOKOT-S) 8.6-50 MG per tablet Take 2 tablets by mouth 2 (two) times daily. For constipation     . Simethicone (GAS RELIEF 80 PO) Take 1 tablet daily as needed by mouth (for bloating).    . traMADol (ULTRAM) 50 MG tablet Take 1 tablet (50 mg total) by mouth 3 (three) times daily. 270 tablet 1   No current facility-administered medications for this  visit.     Allergies as of 08/01/2017 - Review Complete 08/01/2017  Allergen Reaction Noted  . No known allergies  01/10/2016    Family History  Problem Relation Age of Onset  . Arthritis Mother        osteoarthritis  . Colon cancer Mother   . Arthritis Father        osteoarthritis  . Parkinson's disease Father   . Anesthesia problems Neg Hx     Social History   Socioeconomic History  . Marital status: Widowed    Spouse name: Not on file  . Number of children: 3  . Years of education: Not on file  . Highest education level: Not on file  Social Needs  . Financial resource strain: Not on file  . Food insecurity - worry: Not on file  . Food insecurity - inability: Not on file  . Transportation needs - medical: Not on file  . Transportation needs - non-medical: Not on file  Occupational History  . 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 Smoking status: Former Smoker    Packs/day: 1.00    Years: 20.00    Pack years: 20.00    Types: Cigarettes    Last attempt to quit: 04/16/1967    Years since quitting: 50.3  . Smokeless tobacco: Never Used  . Tobacco comment: quit somking in early  1970's  Substance and Sexual Activity  . Alcohol use: No    Alcohol/week: 0.0 oz  . Drug use: No  . Sexual activity: Not Currently  Other Topics Concern  . Not on file  Social History Narrative  . Not on file     Physical Exam: BP 130/78   Pulse 78   Ht 5' 4.5" (1.638 m)   BMI 27.72 kg/m  Constitutional: Frail,  in a wheelchair Psychiatric: alert and oriented x3 Abdomen: soft, nontender, nondistended, no obvious ascites, no peritoneal signs, normal bowel sounds No peripheral edema noted in lower extremities  Assessment and plan: 84 y.o. female with improved dyspepsia, belching  She will continue proton pump inhibitor shortly before breakfast every morning, H2 blocker at bedtime nightly and also Gas-X 2 pills daily.  She knows to call here if she has  any further questions or concerns.  I see no reason for any further workup at this point.  Please see the "Patient Instructions" section for addition details about the plan.  Owens Loffler, MD [facility] 08/01/2017, 1:11 PM

## 2017-08-01 NOTE — Patient Instructions (Addendum)
Continue pepcid at bedtime.  Continue gas-ex (simethicone) one pill twice daily.  Continue pantoprazole (protonix) one pill shortly before BF every morning.  Will correct your med list.  Normal BMI (Body Mass Index- based on height and weight) is between 23 and 30. Your BMI today is Body mass index is 27.72 kg/m. Marland Kitchen Please consider follow up  regarding your BMI with your Primary Care Provider.

## 2017-08-06 NOTE — Telephone Encounter (Signed)
Completed as much as possible on CMN; forwarded to provider/SLS 02/13

## 2017-08-07 NOTE — Telephone Encounter (Signed)
Paperwork ready for pick-up; informed pt's son, Cloria Spring, understood & agreed to p/u during regular business hours/SLS 02/14

## 2017-09-01 ENCOUNTER — Ambulatory Visit: Payer: Medicare Other | Admitting: Medical

## 2017-09-01 ENCOUNTER — Ambulatory Visit: Payer: Self-pay

## 2017-09-01 NOTE — Telephone Encounter (Signed)
Son called to report "we have been noticing for the past week that her feet have been swelling." Reports they elevate her legs. Pt. Is non-ambulatory per son. Pt. Denies any pain or other symptoms. Appointment made for today.  Reason for Disposition . [1] MODERATE pain (e.g., interferes with normal activities, limping) AND [2] present > 3 days  Answer Assessment - Initial Assessment Questions 1. LOCATION: "Which joint is swollen?"     1 week ago 2. ONSET: "When did the swelling start?"     Last week 3. SIZE: "How large is the swelling?"     Can wear loose slippers only 4. PAIN: "Is there any pain?" If so, ask: "How bad is it?" (Scale 1-10; or mild, moderate, severe)     No pain - just swelling to both feet and ankles  5. CAUSE: "What do you think caused the swollen joint?"     It is mostly the feet that are swelling 6. OTHER SYMPTOMS: "Do you have any other symptoms?" (e.g., fever, chest pain, difficulty breathing, calf pain)     No 7. PREGNANCY: "Is there any chance you are pregnant?" "When was your last menstrual period?"     No  Protocols used: ANKLE SWELLING-A-AH

## 2017-09-01 NOTE — Telephone Encounter (Signed)
Appt w/ Percell Miller later today.

## 2017-09-02 ENCOUNTER — Telehealth: Payer: Self-pay | Admitting: Family Medicine

## 2017-09-02 ENCOUNTER — Encounter: Payer: Self-pay | Admitting: Family Medicine

## 2017-09-02 ENCOUNTER — Ambulatory Visit (INDEPENDENT_AMBULATORY_CARE_PROVIDER_SITE_OTHER): Payer: Medicare Other | Admitting: Family Medicine

## 2017-09-02 VITALS — BP 140/73 | HR 89 | Temp 98.4°F | Resp 14 | Ht 64.57 in

## 2017-09-02 DIAGNOSIS — E785 Hyperlipidemia, unspecified: Secondary | ICD-10-CM

## 2017-09-02 DIAGNOSIS — I10 Essential (primary) hypertension: Secondary | ICD-10-CM | POA: Diagnosis not present

## 2017-09-02 DIAGNOSIS — R600 Localized edema: Secondary | ICD-10-CM

## 2017-09-02 LAB — COMPREHENSIVE METABOLIC PANEL
ALBUMIN: 4 g/dL (ref 3.5–5.2)
ALK PHOS: 105 U/L (ref 39–117)
ALT: 22 U/L (ref 0–35)
AST: 27 U/L (ref 0–37)
BUN: 21 mg/dL (ref 6–23)
CO2: 35 mEq/L — ABNORMAL HIGH (ref 19–32)
Calcium: 10.4 mg/dL (ref 8.4–10.5)
Chloride: 100 mEq/L (ref 96–112)
Creatinine, Ser: 0.5 mg/dL (ref 0.40–1.20)
GFR: 149.83 mL/min (ref >60.00)
Glucose, Bld: 98 mg/dL (ref 70–99)
Potassium: 4.6 mEq/L (ref 3.5–5.1)
Sodium: 142 mEq/L (ref 135–145)
TOTAL PROTEIN: 7.1 g/dL (ref 6.0–8.3)
Total Bilirubin: 0.5 mg/dL (ref 0.2–1.2)

## 2017-09-02 LAB — LIPID PANEL
CHOLESTEROL: 164 mg/dL (ref 0–200)
HDL: 58.1 mg/dL (ref >39.00)
LDL Cholesterol: 83 mg/dL (ref 0–99)
NonHDL: 105.44
TRIGLYCERIDES: 112 mg/dL (ref 0.0–149.0)
Total CHOL/HDL Ratio: 3
VLDL: 22.4 mg/dL (ref 0.0–40.0)

## 2017-09-02 MED ORDER — LOSARTAN POTASSIUM 100 MG PO TABS: 100.0000 mg | ORAL_TABLET | Freq: Every day | ORAL | 1 refills | Status: DC

## 2017-09-02 MED ORDER — NONFORMULARY OR COMPOUNDED ITEM: 0 refills | Status: DC

## 2017-09-02 NOTE — Assessment & Plan Note (Signed)
Elevate legs velcro compression hose con't lasix rto prn

## 2017-09-02 NOTE — Progress Notes (Signed)
Subjective:  I acted as a Education administrator for [employer]. Katherine Owen, [place]   Patient ID: Katherine Owen, female    DOB: 1927/08/19, 84 y.o.   MRN: [id number]  Chief Complaint  Patient presents with  . Foot Swelling    pt states feet have swollen for 5-6 weeks.    HPI  Patient is in today for swollen feet.  Family is with her.  She sits in wheelchair most of the day.  They are still trying to get a lift chair for her.  No sob, no cp,    Patient Care Team: Carollee Herter, [name], DO as PCP - General (Family Medicine) Burnell Blanks, MD as Consulting Physician (Cardiology) Justice Britain, MD as Consulting Physician (Orthopedic Surgery) Manson Passey, MD as Referring Physician (Neurosurgery)   Past Medical History:  Diagnosis Date  . Allergy   . Arthritis   . Cataract    Bilateral  . Essential hypertension 05/02/2014  . Headache   . History of shingles   . HLD (hyperlipidemia) 05/02/2014  . Hyperlipidemia   . Hypertension   . Neuropathic pain 05/02/2014  . Osteoarthritis of both shoulders due to rotator cuff injury 04/23/2016  . Poor mobility 08/28/2015  . 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 Spinal stenosis of lumbar region 05/02/2014   S/p decompression at [facility] November 2015     Past Surgical History:  Procedure Laterality Date  . ANKLE SURGERY Left 2014  . BACK SURGERY  2015   [facility]   . BUNIONECTOMY Right   . COLONOSCOPY    . EYE SURGERY     cataract  . REVERSE SHOULDER ARTHROPLASTY Right 01/11/2016   Procedure: REVERSE SHOULDER ARTHROPLASTY;  Surgeon: Justice Britain, MD;  Location: [facility];  Service: Orthopedics;  Laterality: Right;  . SHOULDER SURGERY  01/11/2016   REVERSE SHOULDER ARTHROPLASTY on 01/11/2016  . SPINE SURGERY     done at [facility] Apr 26, 2014, lumbar decompression  . TOTAL HIP ARTHROPLASTY Bilateral 2005, 2009   [place] , [place]    Family History  Problem Relation Age of Onset  . Arthritis Mother        osteoarthritis  . Colon cancer  Mother   . Arthritis Father        osteoarthritis  . Parkinson's disease Father   . Anesthesia problems Neg Hx     Social History   Socioeconomic History  . Marital status: Widowed    Spouse name: Not on file  . Number of children: 3  . Years of education: Not on file  . Highest education level: Not on file  Social Needs  . Financial resource strain: Not on file  . Food insecurity - worry: Not on file  . Food insecurity - inability: Not on file  . Transportation needs - medical: Not on file  . Transportation needs - non-medical: Not on file  Occupational History  . 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 Smoking status: Former Smoker    Packs/day: 1.00    Years: 20.00    Pack years: 20.00    Types: Cigarettes    Last attempt to quit: 04/16/1967    Years since quitting: 50.4  . Smokeless tobacco: Never Used  . Tobacco comment: quit somking in early  1970's  Substance and Sexual Activity  . Alcohol use: No    Alcohol/week: 0.0 oz  . Drug use: No  . Sexual activity: Not Currently  Other Topics Concern  . Not on file  Social History Narrative  . Not on file    Outpatient Medications Prior to Visit  Medication Sig Dispense Refill  . atorvastatin (LIPITOR) 10 MG tablet Take 1 tablet (10 mg total) by mouth at bedtime. For high cholesterol 90 tablet 1  . celecoxib (CELEBREX) 200 MG capsule Take 1 capsule (200 mg total) by mouth daily. 100 capsule 0  . Cholecalciferol (VITAMIN D3) 5000 units CAPS Take 1 capsule by mouth at bedtime.    Marland Kitchen esomeprazole (NEXIUM) 20 MG capsule Take 20 mg by mouth daily before breakfast.    . famotidine (PEPCID) 10 MG tablet Chew 10 mg at bedtime as needed by mouth for heartburn.    . furosemide (LASIX) 20 MG tablet Take 1 tablet (20 mg total) by mouth daily. 90 tablet 1  . gabapentin (NEURONTIN) 100 MG capsule Take 1 capsule (100 mg total) by mouth 3 (three) times daily. 270 capsule 1  . lidocaine (XYLOCAINE) 4 %  external solution Apply topically daily as needed.    . Misc. Devices [facility]) MISC Use as directed 1 each 0  . montelukast (SINGULAIR) 10 MG tablet Take 1 tablet (10 mg total) by mouth at bedtime. 90 tablet 1  . Multiple Vitamins-Minerals (CENTRUM SILVER) tablet Take 1 tablet by mouth daily.    . NONFORMULARY OR COMPOUNDED ITEM Lift recliner #1  As directed  Dx spinal stenosis, osteoarthritis both shoulder ,  Low ext weakness 1 each 0  . pantoprazole (PROTONIX) 40 MG tablet Take 1 tablet (40 mg total) by mouth daily. 90 tablet 3  . senna-docusate (SENOKOT-S) 8.6-50 MG per tablet Take 2 tablets by mouth 2 (two) times daily. For constipation     . Simethicone (GAS RELIEF 80 PO) Take 1 tablet daily as needed by mouth (for bloating).    . traMADol (ULTRAM) 50 MG tablet Take 1 tablet (50 mg total) by mouth 3 (three) times daily. 270 tablet 1  . losartan (COZAAR) 100 MG tablet Take 1 tablet (100 mg total) by mouth daily. 90 tablet 1   No facility-administered medications prior to visit.     Allergies  Allergen Reactions  . No Known Allergies     Review of Systems  Constitutional: Negative for chills, fever and malaise/fatigue.  HENT: Negative for congestion and hearing loss.   Eyes: Negative for discharge.  Respiratory: Negative for cough, sputum production and shortness of breath.   Cardiovascular: Positive for leg swelling. Negative for chest pain and palpitations.  Gastrointestinal: Negative for abdominal pain, blood in stool, constipation, diarrhea, heartburn, nausea and vomiting.  Genitourinary: Negative for dysuria, frequency, hematuria and urgency.  Musculoskeletal: Negative for back pain, falls and myalgias.  Skin: Negative for rash.  Neurological: Negative for weakness.  Endo/Heme/Allergies: Negative for environmental allergies. Does not bruise/bleed easily.  Psychiatric/Behavioral: Negative for depression and suicidal ideas. The patient is not nervous/anxious and does not have  insomnia.        Objective:    Physical Exam  Constitutional: She is oriented to person, place, and time. She appears well-developed and well-nourished.  HENT:  Head: Normocephalic and atraumatic.  Eyes: Conjunctivae and EOM are normal.  Neck: Normal range of motion. Neck supple. No JVD present. Carotid bruit is not present. No thyromegaly present.  Cardiovascular: Normal rate, regular rhythm and normal heart sounds. Exam reveals no gallop and no friction rub.  No murmur heard. Pulmonary/Chest: Effort normal and breath sounds normal. No respiratory distress. She has no wheezes. She has no rales. She exhibits no tenderness.  Musculoskeletal: She exhibits edema.       Right foot: There is swelling.       Left foot: There is no swelling.       Feet:  Neurological: She is alert and oriented to person, place, and time.  Psychiatric: She has a normal mood and affect.  Nursing note and vitals reviewed.   BP 140/73 (BP Location: Left Arm, Patient Position: Sitting, Cuff Size: Normal)   Pulse 89   Temp 98.4 F (36.9 C) (Oral)   Resp 14   Ht 5' 4.57" (1.64 m)   SpO2 100%   BMI 27.66 kg/m  Wt Readings from Last 3 Encounters:  05/12/17 164 lb (74.4 kg)  02/06/17 164 lb (74.4 kg)  07/02/16 162 lb (73.5 kg)   BP Readings from Last 3 Encounters:  09/02/17 140/73  08/01/17 130/78  07/18/17 136/86     Immunization History  Administered Date(s) Administered  . Influenza, High Dose Seasonal PF 04/23/2016, 03/18/2017  . Influenza,inj,Quad PF,6+ Mos 02/23/2015  . Influenza-Unspecified 05/05/2014  . PPD Test 01/14/2016  . Pneumococcal Conjugate-13 06/29/2014  . Pneumococcal Polysaccharide-23 06/29/2012  . Td 06/24/2012    Health Maintenance  Topic Date Due  . DEXA SCAN  11/17/2016  . TETANUS/TDAP  06/24/2022  . INFLUENZA VACCINE  Completed  . PNA vac Low Risk Adult  Completed    Lab Results  Component Value Date   WBC 7.9 05/12/2017   HGB 13.1 05/12/2017   HCT 39.6  05/12/2017   PLT 288 05/12/2017   GLUCOSE 109 (H) 05/12/2017   CHOL 164 03/18/2017   TRIG 150.0 (H) 03/18/2017   HDL 53.00 03/18/2017   LDLCALC 81 03/18/2017   ALT 30 05/12/2017   AST 43 (H) 05/12/2017   NA 138 05/12/2017   K 4.4 05/12/2017   CL 102 05/12/2017   CREATININE 0.53 05/12/2017   BUN 8 05/12/2017   CO2 29 05/12/2017   TSH 1.31 10/27/2014    Lab Results  Component Value Date   TSH 1.31 10/27/2014   Lab Results  Component Value Date   WBC 7.9 05/12/2017   HGB 13.1 05/12/2017   HCT 39.6 05/12/2017   MCV 81.3 05/12/2017   PLT 288 05/12/2017   Lab Results  Component Value Date   NA 138 05/12/2017   K 4.4 05/12/2017   CO2 29 05/12/2017   GLUCOSE 109 (H) 05/12/2017   BUN 8 05/12/2017   CREATININE 0.53 05/12/2017   BILITOT 0.5 05/12/2017   ALKPHOS 93 05/12/2017   AST 43 (H) 05/12/2017   ALT 30 05/12/2017   PROT 6.4 (L) 05/12/2017   ALBUMIN 3.5 05/12/2017   CALCIUM 9.5 05/12/2017   ANIONGAP 7 05/12/2017   GFR 134.36 03/18/2017   Lab Results  Component Value Date   CHOL 164 03/18/2017   Lab Results  Component Value Date   HDL 53.00 03/18/2017   Lab Results  Component Value Date   LDLCALC 81 03/18/2017   Lab Results  Component Value Date   TRIG 150.0 (H) 03/18/2017   Lab Results  Component Value Date   CHOLHDL 3 03/18/2017   No results found for: HGBA1C       Assessment & Plan:   Problem List Items Addressed This Visit      Unprioritized   Essential hypertension    Well controlled, no changes to meds. Encouraged heart healthy diet such as the DASH diet and exercise as tolerated.       Relevant Medications  losartan (COZAAR) 100 MG tablet   Other Relevant Orders   Lipid panel   Comprehensive metabolic panel   Lower extremity edema    Elevate legs velcro compression hose con't lasix rto prn       Relevant Medications   NONFORMULARY OR COMPOUNDED ITEM    Other Visit Diagnoses    Hyperlipidemia LDL goal <100    -  Primary     Relevant Medications   losartan (COZAAR) 100 MG tablet   Other Relevant Orders   Lipid panel   Comprehensive metabolic panel      I am having [name] start on NONFORMULARY OR COMPOUNDED ITEM. I am also having her maintain her senna-docusate, CENTRUM SILVER, celecoxib, Vitamin D3, lidocaine, pantoprazole, atorvastatin, gabapentin, Wheelchair, famotidine, Simethicone (GAS RELIEF 80 PO), montelukast, NONFORMULARY OR COMPOUNDED ITEM, furosemide, traMADol, esomeprazole, and losartan.  Meds ordered this encounter  Medications  . losartan (COZAAR) 100 MG tablet    Sig: Take 1 tablet (100 mg total) by mouth daily.    Dispense:  90 tablet    Refill:  1  . NONFORMULARY OR COMPOUNDED ITEM    Sig: velcro compression hose  #1    Dx edema    Dispense:  1 each    Refill:  0    CMA served as scribe during this visit. History, Physical and Plan performed by medical provider. Documentation and orders reviewed and attested to.  Ann Held, DO

## 2017-09-02 NOTE — Patient Instructions (Signed)
Edema Edema is an abnormal buildup of fluids in your bodytissues. Edema is somewhatdependent on gravity to pull the fluid to the lowest place in your body. That makes the condition more common in the legs and thighs (lower extremities). Painless swelling of the feet and ankles is common and becomes more likely as you get older. It is also common in looser tissues, like around your eyes. When the affected area is squeezed, the fluid may move out of that spot and leave a dent for a few moments. This dent is called pitting. What are the causes? There are many possible causes of edema. Eating too much salt and being on your feet or sitting for a long time can cause edema in your legs and ankles. Hot weather may make edema worse. Common medical causes of edema include:  Heart failure.  Liver disease.  Kidney disease.  Weak blood vessels in your legs.  Cancer.  An injury.  Pregnancy.  Some medications.  Obesity.  What are the signs or symptoms? Edema is usually painless.Your skin may look swollen or shiny. How is this diagnosed? Your health care provider may be able to diagnose edema by asking about your medical history and doing a physical exam. You may need to have tests such as X-rays, an electrocardiogram, or blood tests to check for medical conditions that may cause edema. How is this treated? Edema treatment depends on the cause. If you have heart, liver, or kidney disease, you need the treatment appropriate for these conditions. General treatment may include:  Elevation of the affected body part above the level of your heart.  Compression of the affected body part. Pressure from elastic bandages or support stockings squeezes the tissues and forces fluid back into the blood vessels. This keeps fluid from entering the tissues.  Restriction of fluid and salt intake.  Use of a water pill (diuretic). These medications are appropriate only for some types of edema. They pull fluid  out of your body and make you urinate more often. This gets rid of fluid and reduces swelling, but diuretics can have side effects. Only use diuretics as directed by your health care provider.  Follow these instructions at home:  Keep the affected body part above the level of your heart when you are lying down.  Do not sit still or stand for prolonged periods.  Do not put anything directly under your knees when lying down.  Do not wear constricting clothing or garters on your upper legs.  Exercise your legs to work the fluid back into your blood vessels. This may help the swelling go down.  Wear elastic bandages or support stockings to reduce ankle swelling as directed by your health care provider.  Eat a low-salt diet to reduce fluid if your health care provider recommends it.  Only take medicines as directed by your health care provider. Contact a health care provider if:  Your edema is not responding to treatment.  You have heart, liver, or kidney disease and notice symptoms of edema.  You have edema in your legs that does not improve after elevating them.  You have sudden and unexplained weight gain. Get help right away if:  You develop shortness of breath or chest pain.  You cannot breathe when you lie down.  You develop pain, redness, or warmth in the swollen areas.  You have heart, liver, or kidney disease and suddenly get edema.  You have a fever and your symptoms suddenly get worse. This information is   not intended to replace advice given to you by your health care provider. Make sure you discuss any questions you have with your health care provider. Document Released: 06/10/2005 Document Revised: 11/16/2015 Document Reviewed: 04/02/2013 Elsevier Interactive Patient Education  2017 Elsevier Inc.  

## 2017-09-02 NOTE — Assessment & Plan Note (Signed)
Well controlled, no changes to meds. Encouraged heart healthy diet such as the DASH diet and exercise as tolerated.  °

## 2017-09-02 NOTE — Telephone Encounter (Signed)
Copied from [place] [id number]. Topic: Quick Communication - Rx Refill/Question >> Sep 02, 2017  5:02 PM Neva Seat wrote: Losartan (COZAAR) 100 MG tablet   [facility].  It was supposed to go to:  [place], [place] [address] [place] [address] Phone: [phone] Fax: [phone]  Call Rx into [facility]

## 2017-09-03 MED ORDER — LOSARTAN POTASSIUM 100 MG PO TABS: 100.0000 mg | ORAL_TABLET | Freq: Every day | ORAL | 1 refills | Status: DC

## 2017-09-03 NOTE — Telephone Encounter (Signed)
Per pt. request, called [facility] and cancelled Rx for Losartan.  Sent Rx for Losartan to [facility] in [place], [place].   Notified pt. that the above request was carried out.  Verb. Understanding.

## 2017-09-04 ENCOUNTER — Encounter: Payer: Self-pay | Admitting: *Deleted

## 2017-09-11 ENCOUNTER — Telehealth: Payer: Self-pay | Admitting: Family Medicine

## 2017-09-11 NOTE — Telephone Encounter (Signed)
Copied from [name] [id number]. Topic: Quick Communication - Rx Refill/Question >> Sep 11, 2017  2:49 PM Synthia Innocent wrote: Medication: celecoxib (CELEBREX) 200 MG capsule  Has the patient contacted their pharmacy? Yes.   (Agent: If no, request that the patient contact the pharmacy for the refill.) Preferred Pharmacy (with phone number or street name): [facility], this way it is free for the patient, they will mail to patient, unable to find [facility] on pharmacy list  Agent: Please be advised that RX refills may take up to 3 business days. We ask that you follow-up with your pharmacy.

## 2017-09-15 ENCOUNTER — Ambulatory Visit: Payer: Medicare Other | Admitting: Family Medicine

## 2017-09-15 NOTE — Telephone Encounter (Signed)
Pt following up on request for refill to be sent to [facility].  celecoxib (CELEBREX) 200 MG capsule  The office is the one that usually does for the pt  please advise Pt states she has not received confirmation form [facility] yet.

## 2017-09-16 NOTE — Telephone Encounter (Signed)
Patient notified that medication was ordered.

## 2017-09-17 IMAGING — DX DG SHOULDER 2+V*R*
3 series · 3 of 3 positions shown · non-contrast
Comparison: None

CLINICAL DATA: RIGHT shoulder pain for 2 months, no injury, initial
encounter

EXAM:
RIGHT SHOULDER - 2+ VIEW

[shoulder grashey]
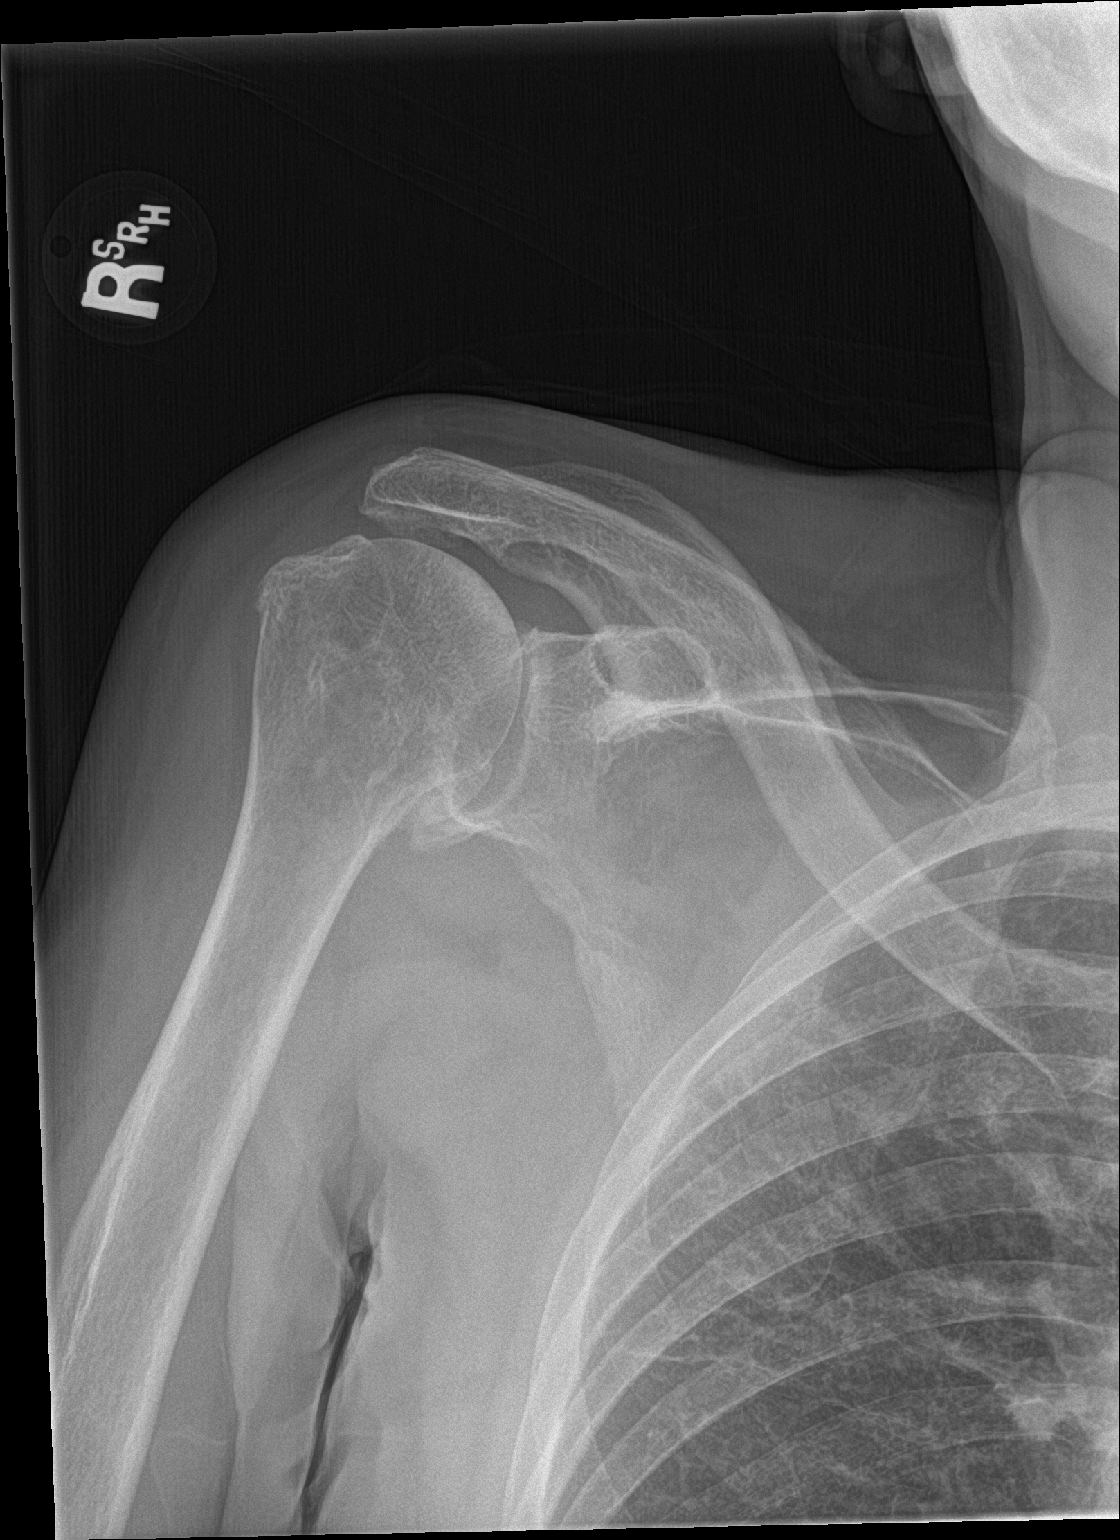

[shoulder y view]
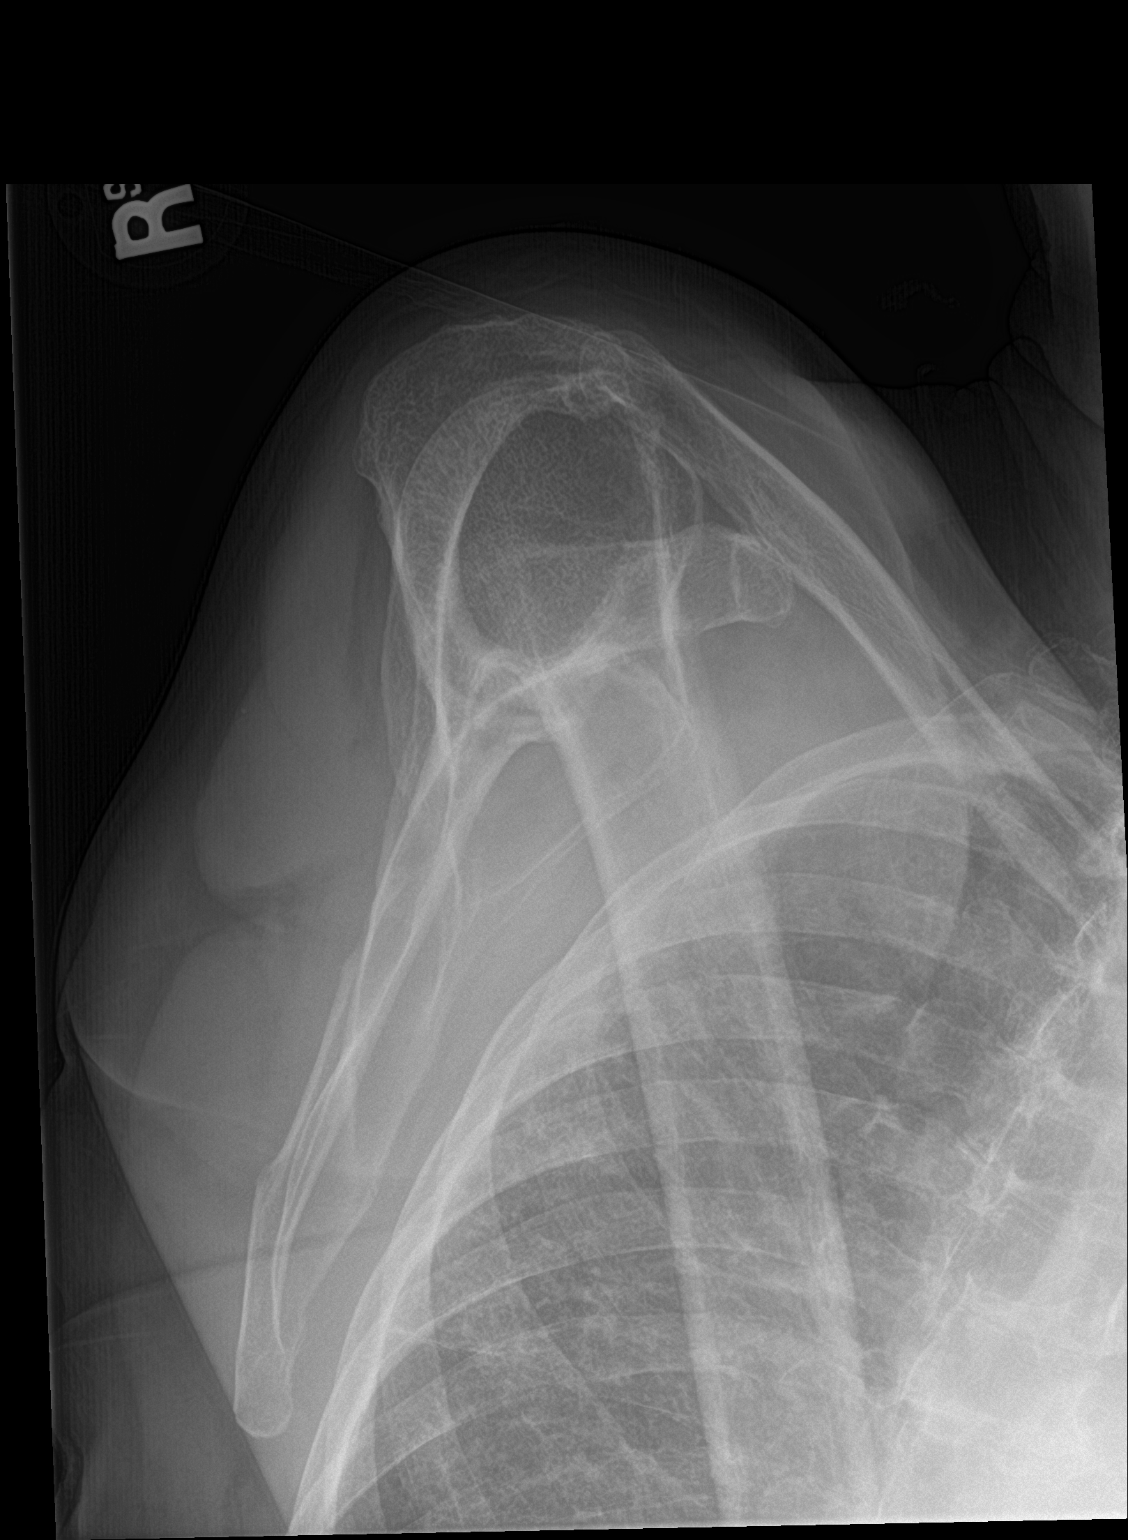

[shoulder axillary]
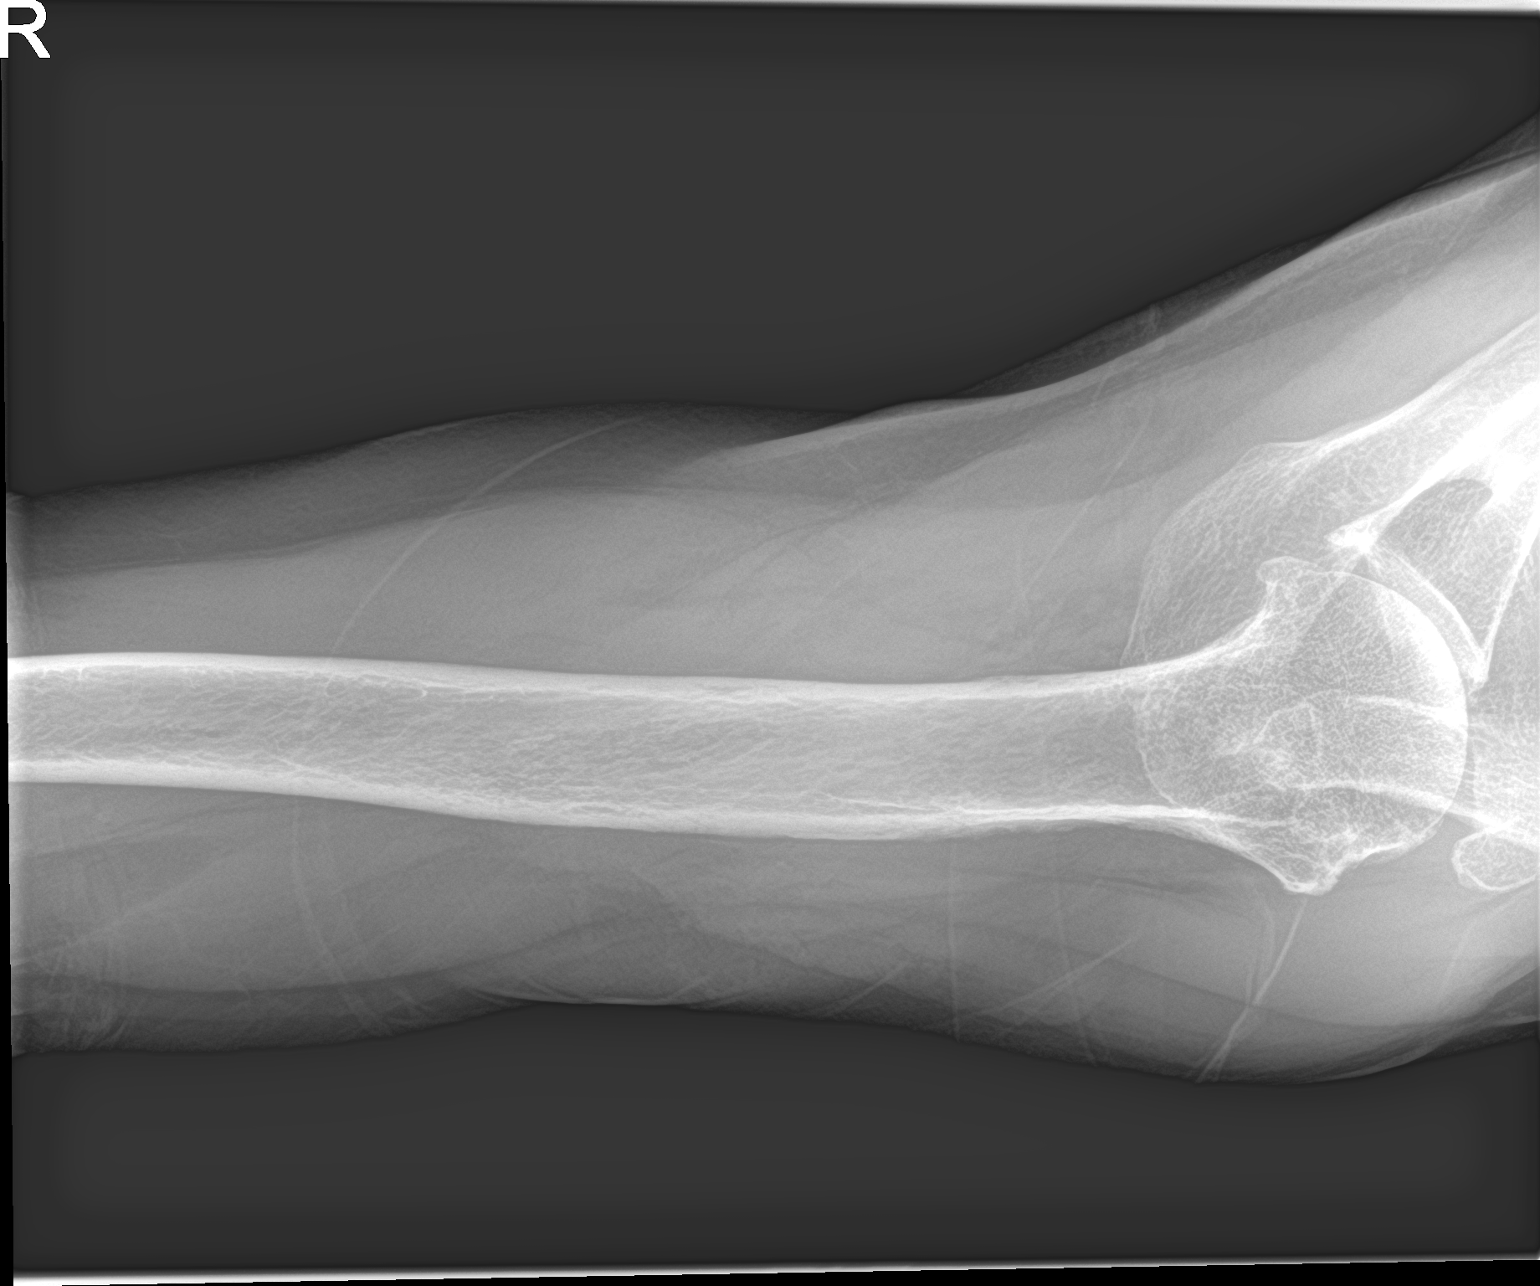

[3 of 3 positions shown; findings below may reference images not displayed]

FINDINGS: Osseous demineralization.

AC joint alignment normal.

Glenohumeral degenerative changes with joint space narrowing and
significant spur formation.

No acute fracture, dislocation or bone destruction.
IMPRESSION: Glenohumeral degenerative changes and osseous demineralization.

No acute abnormalities.

## 2017-09-18 ENCOUNTER — Ambulatory Visit: Payer: Medicare Other | Admitting: Family Medicine

## 2017-09-23 NOTE — Telephone Encounter (Signed)
Pt notified that celebrex was received and placed up front for pickup,

## 2017-10-20 ENCOUNTER — Ambulatory Visit (INDEPENDENT_AMBULATORY_CARE_PROVIDER_SITE_OTHER): Payer: Medicare Other | Admitting: Podiatry

## 2017-10-20 ENCOUNTER — Encounter: Payer: Self-pay | Admitting: Podiatry

## 2017-10-20 DIAGNOSIS — M79675 Pain in left toe(s): Secondary | ICD-10-CM

## 2017-10-20 DIAGNOSIS — L84 Corns and callosities: Secondary | ICD-10-CM | POA: Diagnosis not present

## 2017-10-20 DIAGNOSIS — B351 Tinea unguium: Secondary | ICD-10-CM | POA: Diagnosis not present

## 2017-10-20 DIAGNOSIS — M79674 Pain in right toe(s): Secondary | ICD-10-CM

## 2017-10-20 DIAGNOSIS — H40013 Open angle with borderline findings, low risk, bilateral: Secondary | ICD-10-CM | POA: Diagnosis not present

## 2017-10-20 NOTE — Progress Notes (Signed)
Subjective: 84 y.o. returns the office today for painful, elongated, thickened toenails which she cannot trim herself. Denies any redness or drainage around the nails. Her heal pain has resolved. Denies any acute changes since last appointment and no new complaints today. Denies any systemic complaints such as fevers, chills, nausea, vomiting.   Objective: AAO 3, NAD DP/PT pulses palpable, CRT less than 3 seconds Nails hypertrophic, dystrophic, elongated, brittle, discolored 10. There is tenderness overlying the nails 1-5 bilaterally. There is no surrounding erythema or drainage along the nail sites. Ingrowing of her hallux and 2nd digit toenails On the dorsal aspect of the left second toe with what appears to be a small scab on the PIPJ.  Able to debride some loose tissue today.  There is no underlying ulceration, drainage or signs of infection. No open lesions or pre-ulcerative lesions are identified. No other areas of tenderness bilateral lower extremities. No overlying edema, erythema, increased warmth. No pain with calf compression, swelling, warmth, erythema.  Assessment: Patient presents with symptomatic onychomycosis; pre-ulcerative area left second toe  Plan: -Treatment options including alternatives, risks, complications were discussed -Nails sharply debrided 10 without complication/bleeding. -I lightly debrided the pre-ulcerative callus, area to the left second toe 2 without any complications or bleeding.  Recommend a small amount of antibiotic ointment and a dressing daily.  Discussed offloading at all times. -Discussed daily foot inspection. If there are any changes, to call the office immediately.  -Follow-up in 3 months or sooner if any problems are to arise. In the meantime, encouraged to call the office with any questions, concerns, changes symptoms.  Celesta Gentile, DPM

## 2017-10-22 ENCOUNTER — Ambulatory Visit: Payer: Self-pay

## 2017-10-22 NOTE — Telephone Encounter (Signed)
Author spoke with patient over the phone about Dr. Nonda Lou schedule for tomorrow. Author made pt. aware that Dr. Etter Sjogren is not available today to ask if she feels she can overbook, but pt. can either schedule to see another provider, or call back tomorrow to see if any openings arise. Pt. agreed to make an appointment. Appointment made with Dr. Nani Ravens 5/2 at [facility]. Pt. Denies SOB, and confirms that she is still taking lasix as directed. Team to follow up with pt. tomorrow at scheduled appointment.

## 2017-10-22 NOTE — Telephone Encounter (Signed)
Patient called in with c/o "leg, feet swelling." She says "they have been swollen for a while, but they are getting worse to the point they hurt. At night, the pain is a 8, but during the day a 5. They are swollen from my feet up to my knees. My toes are red, but not my feet and legs. I asked about fever, difficulty breathing, chest pain, she denies. According to protocol, see PCP within 24 hours. Patient says she will have to check with her son to see if the Lucianne Lei is available tomorrow, but wanted me to schedule and she will call back to confirm. Advised no availability with PCP, but I could check other providers, she says "I only want my doctor." I called the office and spoke to [place], [facility] who says she will ask Dr. Carollee Herter and call the patient back with the answer. I advised the patient to expect a call back from the office, care advice given, she verbalized understanding.  Reason for Disposition . [1] MODERATE leg swelling (e.g., swelling extends up to knees) AND [2] new onset or worsening  Answer Assessment - Initial Assessment Questions 1. ONSET: "When did the swelling start?" (e.g., minutes, hours, days)     It's been swollen a while, but it is getting worse 2. LOCATION: "What part of the leg is swollen?"  "Are both legs swollen or just one leg?"     Both feet and up legs up to knees 3. SEVERITY: "How bad is the swelling?" (e.g., localized; mild, moderate, severe)  - Localized - small area of swelling localized to one leg  - MILD pedal edema - swelling limited to foot and ankle, pitting edema < 1/4 inch (6 mm) deep, rest and elevation eliminate most or all swelling  - MODERATE edema - swelling of lower leg to knee, pitting edema > 1/4 inch (6 mm) deep, rest and elevation only partially reduce swelling  - SEVERE edema - swelling extends above knee, facial or hand swelling present     Moderate 4. REDNESS: "Does the swelling look red or infected?"     Toes 5. PAIN: "Is the swelling painful to  touch?" If so, ask: "How painful is it?"   (Scale 1-10; mild, moderate or severe)     8 at night; 5 during the day 6. FEVER: "Do you have a fever?" If so, ask: "What is it, how was it measured, and when did it start?"      No 7. CAUSE: "What do you think is causing the leg swelling?"     No 8. MEDICAL HISTORY: "Do you have a history of heart failure, kidney disease, liver failure, or cancer?"     No 9. RECURRENT SYMPTOM: "Have you had leg swelling before?" If so, ask: "When was the last time?" "What happened that time?"     Yes, but not like this 10. OTHER SYMPTOMS: "Do you have any other symptoms?" (e.g., chest pain, difficulty breathing)       No 11. PREGNANCY: "Is there any chance you are pregnant?" "When was your last menstrual period?"       No  Protocols used: LEG SWELLING AND EDEMA-A-AH

## 2017-10-23 ENCOUNTER — Encounter: Payer: Self-pay | Admitting: Family Medicine

## 2017-10-23 ENCOUNTER — Ambulatory Visit (INDEPENDENT_AMBULATORY_CARE_PROVIDER_SITE_OTHER): Payer: Medicare Other | Admitting: Family Medicine

## 2017-10-23 VITALS — BP 116/84 | HR 56 | Temp 98.1°F | Resp 16 | Ht 64.0 in | Wt 164.0 lb

## 2017-10-23 DIAGNOSIS — R600 Localized edema: Secondary | ICD-10-CM | POA: Diagnosis not present

## 2017-10-23 MED ORDER — FUROSEMIDE 40 MG PO TABS: 40.0000 mg | ORAL_TABLET | Freq: Every day | ORAL | 0 refills | Status: DC

## 2017-10-23 MED ORDER — POTASSIUM CHLORIDE ER 10 MEQ PO TBCR: EXTENDED_RELEASE_TABLET | ORAL | 1 refills | Status: DC

## 2017-10-23 NOTE — Patient Instructions (Addendum)
Take 2 tabs of the Lasix until you run out. Take the potassium with the Lasix.   This could be dependent edema which is treated with leg elevation, movement of legs and compression stockings.   Edema Edema is an abnormal buildup of fluids in your bodytissues. Edema is somewhatdependent on gravity to pull the fluid to the lowest place in your body. That makes the condition more common in the legs and thighs (lower extremities). Painless swelling of the feet and ankles is common and becomes more likely as you get older. It is also common in looser tissues, like around your eyes. When the affected area is squeezed, the fluid may move out of that spot and leave a dent for a few moments. This dent is called pitting. What are the causes? There are many possible causes of edema. Eating too much salt and being on your feet or sitting for a long time can cause edema in your legs and ankles. Hot weather may make edema worse. Common medical causes of edema include:  Heart failure.  Liver disease.  Kidney disease.  Weak blood vessels in your legs.  Cancer.  An injury.  Pregnancy.  Some medications.  Obesity.  What are the signs or symptoms? Edema is usually painless.Your skin may look swollen or shiny. How is this diagnosed? Your health care provider may be able to diagnose edema by asking about your medical history and doing a physical exam. You may need to have tests such as X-rays, an electrocardiogram, or blood tests to check for medical conditions that may cause edema. How is this treated? Edema treatment depends on the cause. If you have heart, liver, or kidney disease, you need the treatment appropriate for these conditions. General treatment may include:  Elevation of the affected body part above the level of your heart.  Compression of the affected body part. Pressure from elastic bandages or support stockings squeezes the tissues and forces fluid back into the blood vessels.  This keeps fluid from entering the tissues.  Restriction of fluid and salt intake.  Use of a water pill (diuretic). These medications are appropriate only for some types of edema. They pull fluid out of your body and make you urinate more often. This gets rid of fluid and reduces swelling, but diuretics can have side effects. Only use diuretics as directed by your health care provider.  Follow these instructions at home:  Keep the affected body part above the level of your heart when you are lying down.  Do not sit still or stand for prolonged periods.  Do not put anything directly under your knees when lying down.  Do not wear constricting clothing or garters on your upper legs.  Exercise your legs to work the fluid back into your blood vessels. This may help the swelling go down.  Wear elastic bandages or support stockings to reduce ankle swelling as directed by your health care provider.  Eat a low-salt diet to reduce fluid if your health care provider recommends it.  Only take medicines as directed by your health care provider. Contact a health care provider if:  Your edema is not responding to treatment.  You have heart, liver, or kidney disease and notice symptoms of edema.  You have edema in your legs that does not improve after elevating them.  You have sudden and unexplained weight gain. Get help right away if:  You develop shortness of breath or chest pain.  You cannot breathe when you lie down.  You develop pain, redness, or warmth in the swollen areas.  You have heart, liver, or kidney disease and suddenly get edema.  You have a fever and your symptoms suddenly get worse. This information is not intended to replace advice given to you by your health care provider. Make sure you discuss any questions you have with your health care provider. Document Released: 06/10/2005 Document Revised: 11/16/2015 Document Reviewed: 04/02/2013 Elsevier Interactive Patient  Education  2017 [employer].

## 2017-10-23 NOTE — Progress Notes (Signed)
Chief Complaint  Patient presents with  . Edema    oen month, bilateral feet, worsening, worse during the day when not elevated    Katherine Owen here for bilateral leg swelling.  Duration: 2 weeks Hx of worsening swelling since this time.  She wears depends at night and is not very ambulatory. She was prescribed furosemide 20 mg and takes it nightly.  She takes around 8 PM and her first urination after this is midnight.  She does not take potassium with this. No injury or recent change in diet. Due to history of neurologic issues, she is unable to use her right lower extremity.  Incidentally, her right lower extremity swelling more than the left side. No hx of DVT. No history of renal, hepatic, or heart failure.  ROS:  MSK- +leg swelling, no pain Lungs- no SOB  Past Medical History:  Diagnosis Date  . Allergy   . Arthritis   . Cataract    Bilateral  . Essential hypertension 05/02/2014  . Headache   . History of shingles   . HLD (hyperlipidemia) 05/02/2014  . Hyperlipidemia   . Hypertension   . Neuropathic pain 05/02/2014  . Osteoarthritis of both shoulders due to rotator cuff injury 04/23/2016  . Poor mobility 08/28/2015  . 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 Spinal stenosis of lumbar region 05/02/2014   S/p decompression at [facility] November 2015    Family History  Problem Relation Age of Onset  . Arthritis Mother        osteoarthritis  . Colon cancer Mother   . Arthritis Father        osteoarthritis  . Parkinson's disease Father   . Anesthesia problems Neg Hx    Past Surgical History:  Procedure Laterality Date  . ANKLE SURGERY Left 2014  . BACK SURGERY  2015   [facility]   . BUNIONECTOMY Right   . COLONOSCOPY    . EYE SURGERY     cataract  . REVERSE SHOULDER ARTHROPLASTY Right 01/11/2016   Procedure: REVERSE SHOULDER ARTHROPLASTY;  Surgeon: Justice Britain, MD;  Location: [place];  Service: Orthopedics;  Laterality: Right;  . SHOULDER SURGERY  01/11/2016    REVERSE SHOULDER ARTHROPLASTY on 01/11/2016  . SPINE SURGERY     done at [facility] Apr 26, 2014, lumbar decompression  . TOTAL HIP ARTHROPLASTY Bilateral 2005, 2009   [place] , [place]    Current Outpatient Medications:  .  atorvastatin (LIPITOR) 10 MG tablet, Take 1 tablet (10 mg total) by mouth at bedtime. For high cholesterol, Disp: 90 tablet, Rfl: 1 .  celecoxib (CELEBREX) 200 MG capsule, Take 1 capsule (200 mg total) by mouth daily., Disp: 100 capsule, Rfl: 0 .  Cholecalciferol (VITAMIN D3) 5000 units CAPS, Take 1 capsule by mouth at bedtime., Disp: , Rfl:  .  esomeprazole (NEXIUM) 20 MG capsule, Take 20 mg by mouth daily before breakfast., Disp: , Rfl:  .  famotidine (PEPCID) 10 MG tablet, Chew 10 mg at bedtime as needed by mouth for heartburn., Disp: , Rfl:  .  gabapentin (NEURONTIN) 100 MG capsule, Take 1 capsule (100 mg total) by mouth 3 (three) times daily., Disp: 270 capsule, Rfl: 1 .  lidocaine (XYLOCAINE) 4 % external solution, Apply topically daily as needed., Disp: , Rfl:  .  losartan (COZAAR) 100 MG tablet, Take 1 tablet (100 mg total) by mouth daily., Disp: 90 tablet, Rfl: 1 .  Misc. Devices [facility]) MISC, Use as directed, Disp: 1 each, Rfl: 0 .  montelukast (SINGULAIR) 10 MG tablet, Take 1 tablet (10 mg total) by mouth at bedtime., Disp: 90 tablet, Rfl: 1 .  Multiple Vitamins-Minerals (CENTRUM SILVER) tablet, Take 1 tablet by mouth daily., Disp: , Rfl:  .  NONFORMULARY OR COMPOUNDED ITEM, Lift recliner #1  As directed  Dx spinal stenosis, osteoarthritis both shoulder ,  Low ext weakness, Disp: 1 each, Rfl: 0 .  NONFORMULARY OR COMPOUNDED ITEM, velcro compression hose  #1    Dx edema, Disp: 1 each, Rfl: 0 .  pantoprazole (PROTONIX) 40 MG tablet, Take 1 tablet (40 mg total) by mouth daily., Disp: 90 tablet, Rfl: 3 .  senna-docusate (SENOKOT-S) 8.6-50 MG per tablet, Take 2 tablets by mouth 2 (two) times daily. For constipation , Disp: , Rfl:  .  Simethicone (GAS RELIEF 80 PO), Take  1 tablet daily as needed by mouth (for bloating)., Disp: , Rfl:  .  traMADol (ULTRAM) 50 MG tablet, Take 1 tablet (50 mg total) by mouth 3 (three) times daily., Disp: 270 tablet, Rfl: 1 .  furosemide (LASIX) 40 MG tablet, Take 1 tablet (40 mg total) by mouth daily., Disp: 90 tablet, Rfl: 0 .  potassium chloride (KLOR-CON 10) 10 MEQ tablet, Take 1 tab with each dose of Lasix., Disp: 30 tablet, Rfl: 1  BP 116/84 (BP Location: Left Arm, Patient Position: Sitting, Cuff Size: Large)   Pulse (!) 56   Temp 98.1 F (36.7 C) (Oral)   Resp 16   Ht 5\' 4"  (1.626 m)   Wt 164 lb (74.4 kg)   BMI 28.15 kg/m  Gen- awake, alert, appears stated age Heart- RRR, 2+ b/l LE edema tapering at prox 1/3 of tibia on L and up to knee on R Lungs- CTAB, normal effort w/o accessory muscle use MSK- no calf pain ttp b/l Psych: Age appropriate judgment and insight  Lower extremity edema - Plan: furosemide (LASIX) 40 MG tablet, potassium chloride (KLOR-CON 10) 10 MEQ tablet, Basic metabolic panel  Orders as above.  I would like to see if dose of Lasix will help both diuresis and remove fluid from her legs.  Compression stocking prescription given.  She was instructed to take 2 tabs of her 20 mg tablet furosemide until she runs out.  A new prescription has been called to her mail order pharmacy.  Potassium to take with Lasix was called into her local pharmacy.  Elevate legs, mind salt intake, try to be as active as possible. Normal echo was reviewed in 2017 and normal CMP noted from earlier in the year. F/u in 1 week for labs checking electrolytes in 1 month with her regular PCP to recheck swelling. Pt and her son voiced understanding and agreement to the plan.  Greater than 25 minutes were spent face to face with the patient with greater than 50% of this time spent counseling on edema, etiologies of this, treatment options, Lasix and follow up.   [place], DO 10/23/17  12:30 PM

## 2017-10-30 ENCOUNTER — Encounter: Payer: Self-pay | Admitting: Family Medicine

## 2017-10-30 ENCOUNTER — Ambulatory Visit (INDEPENDENT_AMBULATORY_CARE_PROVIDER_SITE_OTHER): Payer: Medicare Other | Admitting: Family Medicine

## 2017-10-30 VITALS — BP 138/78 | HR 77 | Temp 98.4°F | Resp 16

## 2017-10-30 DIAGNOSIS — R600 Localized edema: Secondary | ICD-10-CM | POA: Diagnosis not present

## 2017-10-30 DIAGNOSIS — E876 Hypokalemia: Secondary | ICD-10-CM

## 2017-10-30 LAB — BASIC METABOLIC PANEL
BUN: 35 mg/dL — ABNORMAL HIGH (ref 6–23)
CALCIUM: 10.1 mg/dL (ref 8.4–10.5)
CO2: 33 mEq/L — ABNORMAL HIGH (ref 19–32)
CREATININE: 0.69 mg/dL (ref 0.40–1.20)
Chloride: 101 mEq/L (ref 96–112)
GFR: 103.28 mL/min (ref >60.00)
Glucose, Bld: 116 mg/dL — ABNORMAL HIGH (ref 70–99)
Potassium: 4.6 mEq/L (ref 3.5–5.1)
Sodium: 141 mEq/L (ref 135–145)

## 2017-10-30 NOTE — Progress Notes (Signed)
Patient ID: Katherine Owen, female    DOB: 1927-12-10  Age: 84 y.o. MRN: [id number]    Subjective:  Subjective  HPI Katherine Owen presents for f/u edema.  It is much better with higher dose of lasix and stockings.    Review of Systems  Constitutional: Negative for activity change, appetite change, fatigue and unexpected weight change.  Respiratory: Negative for cough and shortness of breath.   Cardiovascular: Positive for leg swelling. Negative for chest pain and palpitations.  Psychiatric/Behavioral: Negative for behavioral problems and dysphoric mood. The patient is not nervous/anxious.     History Past Medical History:  Diagnosis Date  . Allergy   . Arthritis   . Cataract    Bilateral  . Essential hypertension 05/02/2014  . Headache   . History of shingles   . HLD (hyperlipidemia) 05/02/2014  . Hyperlipidemia   . Hypertension   . Neuropathic pain 05/02/2014  . Osteoarthritis of both shoulders due to rotator cuff injury 04/23/2016  . Poor mobility 08/28/2015  . 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 Spinal stenosis of lumbar region 05/02/2014   S/p decompression at [facility] November 2015     She has a past surgical history that includes Ankle surgery (Left, 2014); Colonoscopy; Spine surgery; Back surgery (2015); Total hip arthroplasty (Bilateral, 2005, 2009); Bunionectomy (Right); Eye surgery; Shoulder surgery (01/11/2016); and Reverse shoulder arthroplasty (Right, 01/11/2016).   Her family history includes Arthritis in her father and mother; Colon cancer in her mother; Parkinson's disease in her father.She reports that she quit smoking about 50 years ago. Her smoking use included cigarettes. She has a 20.00 pack-year smoking history. She has never used smokeless tobacco. She reports that she does not drink alcohol or use drugs.  Current Outpatient Medications on File Prior to Visit  Medication Sig Dispense Refill  . atorvastatin (LIPITOR) 10 MG tablet Take 1 tablet  (10 mg total) by mouth at bedtime. For high cholesterol 90 tablet 1  . celecoxib (CELEBREX) 200 MG capsule Take 1 capsule (200 mg total) by mouth daily. 100 capsule 0  . Cholecalciferol (VITAMIN D3) 5000 units CAPS Take 1 capsule by mouth at bedtime.    Marland Kitchen esomeprazole (NEXIUM) 20 MG capsule Take 20 mg by mouth daily before breakfast.    . famotidine (PEPCID) 10 MG tablet Chew 10 mg at bedtime as needed by mouth for heartburn.    . furosemide (LASIX) 40 MG tablet Take 1 tablet (40 mg total) by mouth daily. 90 tablet 0  . gabapentin (NEURONTIN) 100 MG capsule Take 1 capsule (100 mg total) by mouth 3 (three) times daily. 270 capsule 1  . lidocaine (XYLOCAINE) 4 % external solution Apply topically daily as needed.    Marland Kitchen losartan (COZAAR) 100 MG tablet Take 1 tablet (100 mg total) by mouth daily. 90 tablet 1  . Misc. Devices [facility]) MISC Use as directed 1 each 0  . montelukast (SINGULAIR) 10 MG tablet Take 1 tablet (10 mg total) by mouth at bedtime. 90 tablet 1  . Multiple Vitamins-Minerals (CENTRUM SILVER) tablet Take 1 tablet by mouth daily.    . NONFORMULARY OR COMPOUNDED ITEM Lift recliner #1  As directed  Dx spinal stenosis, osteoarthritis both shoulder ,  Low ext weakness 1 each 0  . NONFORMULARY OR COMPOUNDED ITEM velcro compression hose  #1    Dx edema 1 each 0  . pantoprazole (PROTONIX) 40 MG tablet Take 1 tablet (40 mg total) by mouth daily. 90 tablet 3  . potassium chloride (  KLOR-CON 10) 10 MEQ tablet Take 1 tab with each dose of Lasix. 30 tablet 1  . senna-docusate (SENOKOT-S) 8.6-50 MG per tablet Take 2 tablets by mouth 2 (two) times daily. For constipation     . Simethicone (GAS RELIEF 80 PO) Take 1 tablet daily as needed by mouth (for bloating).    . traMADol (ULTRAM) 50 MG tablet Take 1 tablet (50 mg total) by mouth 3 (three) times daily. 270 tablet 1   No current facility-administered medications on file prior to visit.      Objective:  Objective  Physical Exam    Constitutional: She is oriented to person, place, and time. She appears well-developed and well-nourished.  HENT:  Head: Normocephalic and atraumatic.  Eyes: Conjunctivae and EOM are normal.  Neck: Normal range of motion. Neck supple. No JVD present. Carotid bruit is not present. No thyromegaly present.  Cardiovascular: Normal rate, regular rhythm and normal heart sounds.  No murmur heard. Pulmonary/Chest: Effort normal and breath sounds normal. No respiratory distress. She has no wheezes. She has no rales. She exhibits no tenderness.  Musculoskeletal: She exhibits edema.       Arms: Neurological: She is alert and oriented to person, place, and time.  Psychiatric: She has a normal mood and affect.   BP 138/78 (BP Location: Right Arm, Cuff Size: Normal)   Pulse 77   Temp 98.4 F (36.9 C) (Oral)   Resp 16  Wt Readings from Last 3 Encounters:  10/23/17 164 lb (74.4 kg)  05/12/17 164 lb (74.4 kg)  02/06/17 164 lb (74.4 kg)     Lab Results  Component Value Date   WBC 7.9 05/12/2017   HGB 13.1 05/12/2017   HCT 39.6 05/12/2017   PLT 288 05/12/2017   GLUCOSE 98 09/02/2017   CHOL 164 09/02/2017   TRIG 112.0 09/02/2017   HDL 58.10 09/02/2017   LDLCALC 83 09/02/2017   ALT 22 09/02/2017   AST 27 09/02/2017   NA 142 09/02/2017   K 4.6 09/02/2017   CL 100 09/02/2017   CREATININE 0.50 09/02/2017   BUN 21 09/02/2017   CO2 35 (H) 09/02/2017   TSH 1.31 10/27/2014    Dg Chest 2 View  Result Date: 07/18/2017 CLINICAL DATA:  Lower extremity swelling for 1 month.  Nonsmoker. EXAM: CHEST  2 VIEW COMPARISON:  None. FINDINGS: Heart is within normal limits. There is mild-to-moderate aortic atherosclerosis at the arch. Subpleural fine reticular lung markings are seen bilaterally in keeping with mild interstitial fibrosis or interstitial lung disease. No overt pulmonary edema. No pneumonic consolidation, effusion or pneumothorax. ACDF of the lower cervical spine, reverse right shoulder  arthroplasty and lower thoracic spinal fusion hardware place. Thoracic spondylosis is noted. Osteoarthritis of the native left glenohumeral joint with joint space narrowing, subchondral sclerosis and cystic change. IMPRESSION: No active cardiopulmonary disease. Subpleural areas of fine reticular lung markings bilaterally in keeping with interstitial fibrosis. Aortic atherosclerosis. Electronically Signed   By: Ashley Royalty M.D.   On: 07/18/2017 15:13     Assessment & Plan:  Plan  I am having Katherine Owen maintain her senna-docusate, CENTRUM SILVER, celecoxib, Vitamin D3, lidocaine, pantoprazole, atorvastatin, gabapentin, Wheelchair, famotidine, Simethicone (GAS RELIEF 80 PO), montelukast, NONFORMULARY OR COMPOUNDED ITEM, traMADol, esomeprazole, NONFORMULARY OR COMPOUNDED ITEM, losartan, furosemide, and potassium chloride.  No orders of the defined types were placed in this encounter.   Problem List Items Addressed This Visit      Unprioritized   Lower extremity edema  Elevate legs con't compression stockings con't higher dose lasix  Check bmp today  Eat/ drink K rich foods--- -HO given to pt        Other Visit Diagnoses    Hypokalemia    -  Primary   Relevant Orders   Basic metabolic panel    last ov and labs reviewed   Follow-up: Return in about 3 months (around 01/30/2018).  Ann Held, DO

## 2017-10-30 NOTE — Assessment & Plan Note (Signed)
Elevate legs con't compression stockings con't higher dose lasix  Check bmp today  Eat/ drink K rich foods--- -HO given to pt

## 2017-10-30 NOTE — Patient Instructions (Signed)

## 2017-11-11 ENCOUNTER — Other Ambulatory Visit: Payer: Self-pay | Admitting: Family Medicine

## 2017-11-11 DIAGNOSIS — R600 Localized edema: Secondary | ICD-10-CM

## 2017-11-18 ENCOUNTER — Telehealth: Payer: Self-pay | Admitting: Family Medicine

## 2017-11-18 DIAGNOSIS — R1013 Epigastric pain: Secondary | ICD-10-CM

## 2017-11-18 DIAGNOSIS — R600 Localized edema: Secondary | ICD-10-CM

## 2017-11-18 DIAGNOSIS — I10 Essential (primary) hypertension: Secondary | ICD-10-CM

## 2017-11-18 DIAGNOSIS — E785 Hyperlipidemia, unspecified: Secondary | ICD-10-CM

## 2017-11-18 NOTE — Telephone Encounter (Addendum)
Copied from [place]. Topic: Quick Communication - Rx Refill/Question >> Nov 18, 2017  1:43 PM Lennox Solders wrote: Medication: atorvastatin, furosemide, gabapentin, losartan,montelukast,pantoprazole, potassium and tramadol Has the patient contacted their pharmacy?no pt was last seen on 10-30-17. Pt needs 90 day supply w/refills (Agent: If yes, when and what did the pharmacy advise?)  Preferred Pharmacy (with phone number or street name):[facility] Agent: Please be advised that RX refills may take up to 3 business days. We ask that you follow-up with your pharmacy.

## 2017-11-19 ENCOUNTER — Other Ambulatory Visit: Payer: Self-pay | Admitting: Family Medicine

## 2017-11-19 DIAGNOSIS — M150 Primary generalized (osteo)arthritis: Principal | ICD-10-CM

## 2017-11-19 DIAGNOSIS — M159 Polyosteoarthritis, unspecified: Secondary | ICD-10-CM

## 2017-11-19 MED ORDER — ATORVASTATIN CALCIUM 10 MG PO TABS
10.0000 mg | ORAL_TABLET | Freq: Every day | ORAL | 1 refills | Status: DC
Start: 2017-11-19 — End: 2019-07-27

## 2017-11-19 MED ORDER — MONTELUKAST SODIUM 10 MG PO TABS: 10.0000 mg | ORAL_TABLET | Freq: Every day | ORAL | 1 refills | Status: AC

## 2017-11-19 MED ORDER — POTASSIUM CHLORIDE ER 10 MEQ PO TBCR: 10.0000 meq | EXTENDED_RELEASE_TABLET | Freq: Every day | ORAL | 1 refills | Status: DC

## 2017-11-19 MED ORDER — LOSARTAN POTASSIUM 100 MG PO TABS: 100.0000 mg | ORAL_TABLET | Freq: Every day | ORAL | 1 refills | Status: DC

## 2017-11-19 MED ORDER — PANTOPRAZOLE SODIUM 40 MG PO TBEC
40.0000 mg | DELAYED_RELEASE_TABLET | Freq: Every day | ORAL | 1 refills | Status: DC
Start: 2017-11-19 — End: 2018-05-20

## 2017-11-19 MED ORDER — GABAPENTIN 100 MG PO CAPS: 100.0000 mg | ORAL_CAPSULE | Freq: Three times a day (TID) | ORAL | 1 refills | Status: AC

## 2017-11-19 MED ORDER — TRAMADOL HCL 50 MG PO TABS: 50.0000 mg | ORAL_TABLET | Freq: Three times a day (TID) | ORAL | 1 refills | Status: DC

## 2017-11-19 MED ORDER — FUROSEMIDE 40 MG PO TABS: 40.0000 mg | ORAL_TABLET | Freq: Every day | ORAL | 1 refills | Status: DC

## 2017-11-19 NOTE — Telephone Encounter (Signed)
done

## 2017-11-19 NOTE — Telephone Encounter (Signed)
Patient is requesting refills for numerous medications including Tramadol which is why I am routed to office for resolution.  LOV 10/30/17 medications addressed in visit plan to continue.  PCP Dr. Carollee Herter Patient is requesting these refills to be sent to [facility].

## 2017-11-19 NOTE — Telephone Encounter (Signed)
Database ran and is on your desk for review.  Last filled per database: 10/08/17 Last written: 07/18/17 Last ov: 10/30/17 Next ov: 11/24/17 Contract: will get at 11/24/17 visit SHN:GITJ get at 11/24/17 visit  Other rxs sent to pharmacy

## 2017-11-24 ENCOUNTER — Encounter: Payer: Self-pay | Admitting: Family Medicine

## 2017-11-24 ENCOUNTER — Ambulatory Visit (INDEPENDENT_AMBULATORY_CARE_PROVIDER_SITE_OTHER): Payer: Medicare Other | Admitting: Family Medicine

## 2017-11-24 VITALS — BP 118/70 | Temp 97.7°F | Resp 16

## 2017-11-24 DIAGNOSIS — S81802A Unspecified open wound, left lower leg, initial encounter: Secondary | ICD-10-CM | POA: Diagnosis not present

## 2017-11-24 DIAGNOSIS — S91109A Unspecified open wound of unspecified toe(s) without damage to nail, initial encounter: Secondary | ICD-10-CM | POA: Diagnosis not present

## 2017-11-24 DIAGNOSIS — R600 Localized edema: Secondary | ICD-10-CM | POA: Diagnosis not present

## 2017-11-24 MED ORDER — DOXYCYCLINE HYCLATE 100 MG PO TABS: 100.0000 mg | ORAL_TABLET | Freq: Two times a day (BID) | ORAL | 0 refills | Status: DC

## 2017-11-24 NOTE — Assessment & Plan Note (Signed)
Home health referral for wound care Consider wound clinic if needed abx per orders  Instructed pt to try to shift weight in her chair to keep pressure off her calf Will try to get family help in home with pt as well.

## 2017-11-24 NOTE — Progress Notes (Signed)
Check labs.  Adjust meds prnCheck labs.  Adjust meds prnCheck labs.  Adjust meds prn Patient ID: Katherine Owen, female    DOB: 16-Sep-1927  Age: 84 y.o. MRN: [id number]    Subjective:  Subjective  HPI Katherine Owen presents for f/u edema and c/o sore on back of L calf and bump on R calf in same place.  Pt son and daughter in law are present as well.   Pt sits in her wheelchair most days.  It is very difficult to move pt from bed to chair more than one time.  Family is requesting help with her at home.   She has been unable to wear compression stockings due to sores.     Review of Systems  Constitutional: Negative for activity change, appetite change, fatigue and unexpected weight change.  Respiratory: Negative for cough and shortness of breath.   Cardiovascular: Negative for chest pain and palpitations.  Skin: Positive for color change and wound.  Psychiatric/Behavioral: Negative for behavioral problems and dysphoric mood. The patient is not nervous/anxious.     History Past Medical History:  Diagnosis Date  . Allergy   . Arthritis   . Cataract    Bilateral  . Essential hypertension 05/02/2014  . Headache   . History of shingles   . HLD (hyperlipidemia) 05/02/2014  . Hyperlipidemia   . Hypertension   . Neuropathic pain 05/02/2014  . Osteoarthritis of both shoulders due to rotator cuff injury 04/23/2016  . Poor mobility 08/28/2015  . 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 Spinal stenosis of lumbar region 05/02/2014   S/p decompression at [facility] November 2015     She has a past surgical history that includes Ankle surgery (Left, 2014); Colonoscopy; Spine surgery; Back surgery (2015); Total hip arthroplasty (Bilateral, 2005, 2009); Bunionectomy (Right); Eye surgery; Shoulder surgery (01/11/2016); and Reverse shoulder arthroplasty (Right, 01/11/2016).   Her family history includes Arthritis in her father and mother; Colon cancer in her mother; Parkinson's disease in her  father.She reports that she quit smoking about 50 years ago. Her smoking use included cigarettes. She has a 20.00 pack-year smoking history. She has never used smokeless tobacco. She reports that she does not drink alcohol or use drugs.  Current Outpatient Medications on File Prior to Visit  Medication Sig Dispense Refill  . atorvastatin (LIPITOR) 10 MG tablet Take 1 tablet (10 mg total) by mouth at bedtime. For high cholesterol 90 tablet 1  . celecoxib (CELEBREX) 200 MG capsule Take 1 capsule (200 mg total) by mouth daily. 100 capsule 0  . Cholecalciferol (VITAMIN D3) 5000 units CAPS Take 1 capsule by mouth at bedtime.    Marland Kitchen esomeprazole (NEXIUM) 20 MG capsule Take 20 mg by mouth daily before breakfast.    . famotidine (PEPCID) 10 MG tablet Chew 10 mg at bedtime as needed by mouth for heartburn.    . furosemide (LASIX) 40 MG tablet Take 1 tablet (40 mg total) by mouth daily. 90 tablet 1  . gabapentin (NEURONTIN) 100 MG capsule Take 1 capsule (100 mg total) by mouth 3 (three) times daily. 270 capsule 1  . lidocaine (XYLOCAINE) 4 % external solution Apply topically daily as needed.    Marland Kitchen losartan (COZAAR) 100 MG tablet Take 1 tablet (100 mg total) by mouth daily. 90 tablet 1  . Misc. Devices [facility]) MISC Use as directed 1 each 0  . montelukast (SINGULAIR) 10 MG tablet Take 1 tablet (10 mg total) by mouth at bedtime. 90 tablet 1  .  Multiple Vitamins-Minerals (CENTRUM SILVER) tablet Take 1 tablet by mouth daily.    . NONFORMULARY OR COMPOUNDED ITEM Lift recliner #1  As directed  Dx spinal stenosis, osteoarthritis both shoulder ,  Low ext weakness 1 each 0  . NONFORMULARY OR COMPOUNDED ITEM velcro compression hose  #1    Dx edema 1 each 0  . pantoprazole (PROTONIX) 40 MG tablet Take 1 tablet (40 mg total) by mouth daily. 90 tablet 1  . potassium chloride (K-DUR) 10 MEQ tablet Take 1 tablet (10 mEq total) by mouth daily. With each dose of lasix 90 tablet 1  . Simethicone (GAS RELIEF 80 PO) Take 1  tablet daily as needed by mouth (for bloating).    . traMADol (ULTRAM) 50 MG tablet Take 1 tablet (50 mg total) by mouth 3 (three) times daily. 270 tablet 1  . senna-docusate (SENOKOT-S) 8.6-50 MG per tablet Take 2 tablets by mouth 2 (two) times daily. For constipation      No current facility-administered medications on file prior to visit.      Objective:  Objective  Physical Exam  Constitutional: She is oriented to person, place, and time. She appears well-developed and well-nourished.  HENT:  Head: Normocephalic and atraumatic.  Eyes: Conjunctivae and EOM are normal.  Neck: Normal range of motion. Neck supple. No JVD present. Carotid bruit is not present. No thyromegaly present.  Cardiovascular: Normal rate, regular rhythm and normal heart sounds.  No murmur heard. Pulmonary/Chest: Effort normal and breath sounds normal. No respiratory distress. She has no wheezes. She has no rales. She exhibits no tenderness.  Musculoskeletal: She exhibits no edema.  Neurological: She is alert and oriented to person, place, and time.  Skin: There is erythema.     Psychiatric: She has a normal mood and affect.  Nursing note and vitals reviewed.  BP 118/70 (BP Location: Left Arm, Cuff Size: Large)   Temp 97.7 F (36.5 C) (Oral)   Resp 16  Wt Readings from Last 3 Encounters:  10/23/17 164 lb (74.4 kg)  05/12/17 164 lb (74.4 kg)  02/06/17 164 lb (74.4 kg)       Lab Results  Component Value Date   WBC 7.9 05/12/2017   HGB 13.1 05/12/2017   HCT 39.6 05/12/2017   PLT 288 05/12/2017   GLUCOSE 116 (H) 10/30/2017   CHOL 164 09/02/2017   TRIG 112.0 09/02/2017   HDL 58.10 09/02/2017   LDLCALC 83 09/02/2017   ALT 22 09/02/2017   AST 27 09/02/2017   NA 141 10/30/2017   K 4.6 10/30/2017   CL 101 10/30/2017   CREATININE 0.69 10/30/2017   BUN 35 (H) 10/30/2017   CO2 33 (H) 10/30/2017   TSH 1.31 10/27/2014    Dg Chest 2 View  Result Date: 07/18/2017 CLINICAL DATA:  Lower extremity  swelling for 1 month.  Nonsmoker. EXAM: CHEST  2 VIEW COMPARISON:  None. FINDINGS: Heart is within normal limits. There is mild-to-moderate aortic atherosclerosis at the arch. Subpleural fine reticular lung markings are seen bilaterally in keeping with mild interstitial fibrosis or interstitial lung disease. No overt pulmonary edema. No pneumonic consolidation, effusion or pneumothorax. ACDF of the lower cervical spine, reverse right shoulder arthroplasty and lower thoracic spinal fusion hardware place. Thoracic spondylosis is noted. Osteoarthritis of the native left glenohumeral joint with joint space narrowing, subchondral sclerosis and cystic change. IMPRESSION: No active cardiopulmonary disease. Subpleural areas of fine reticular lung markings bilaterally in keeping with interstitial fibrosis. Aortic atherosclerosis. Electronically Signed  By: Ashley Royalty M.D.   On: 07/18/2017 15:13     Assessment & Plan:  Plan  I am having Katherine Owen start on doxycycline. I am also having her maintain her senna-docusate, CENTRUM SILVER, celecoxib, Vitamin D3, lidocaine, Wheelchair, famotidine, Simethicone (GAS RELIEF 80 PO), NONFORMULARY OR COMPOUNDED ITEM, esomeprazole, NONFORMULARY OR COMPOUNDED ITEM, atorvastatin, furosemide, gabapentin, losartan, montelukast, pantoprazole, potassium chloride, and traMADol.  Meds ordered this encounter  Medications  . doxycycline (VIBRA-TABS) 100 MG tablet    Sig: Take 1 tablet (100 mg total) by mouth 2 (two) times daily.    Dispense:  20 tablet    Refill:  0    Problem List Items Addressed This Visit      Unprioritized   Lower extremity edema    Keep legs elevated as much as possible She is unable to wear compression stocking due to open sores Home health is consulted  Vascular referral as well.---  Family request  con't lasix       Relevant Orders   Ambulatory referral to Vascular Surgery   Open toe wound, initial encounter    F/u podiatry       Open  wound of left lower leg - Primary    Home health referral for wound care Consider wound clinic if needed abx per orders  Instructed pt to try to shift weight in her chair to keep pressure off her calf Will try to get family help in home with pt as well.      Relevant Medications   doxycycline (VIBRA-TABS) 100 MG tablet   Other Relevant Orders   Ambulatory referral to [place]    pt here >.30 min with >50 % time face to face discussing edema and open wound Leg , sores on toes  Follow-up: Return if symptoms worsen or fail to improve.  Ann Held, DO

## 2017-11-24 NOTE — Assessment & Plan Note (Signed)
Keep legs elevated as much as possible She is unable to wear compression stocking due to open sores Home health is consulted  Vascular referral as well.---  Family request  con't lasix

## 2017-11-24 NOTE — Patient Instructions (Signed)
Preventing Pressure Injuries WHAT IS A PRESSURE INJURY? A pressure injury, previously called a bedsore or a pressure ulcer, is an injury to the skin and underlying tissue caused by pressure. A pressure injury can happen when your skin presses against a surface, such as a mattress or wheelchair seat, for too long. The pressure on the blood vessels causes reduced blood flow to your skin. This can eventually cause the skin tissue to die and break down into a wound. Pressure injuries usually develop:  Over bony parts of the body, such as the tailbone, shoulders, elbows, hips, and heels.  Under medical devices, such as respiratory equipment, stockings, tubes, and splints.  They can cause pain, muscle damage, and infection. HOW DO PRESSURE INJURIES HAPPEN? Pressure injuries are caused by a lack of blood supply to an area of skin. These injuries begin as a reddened area on the skin and can become an open sore. They can result from intense pressure over a short period of time or from less pressure over a long period of time. Pressure injuries can vary in severity. This condition is more likely to develop in people who:  Are in the hospital or an extended care facility.  Are bedridden or in a wheelchair.  Have an injury or disease that keeps them from: ? Moving normally. ? Feeling pain or pressure. ? Communicating if they feel pain or pressure.  Have a condition that: ? Makes them sleepy or less alert. ? Causes poor blood flow.  Need to wear a medical device.  Have poor control of their bladder or bowel functions (incontinence).  Have poor nutrition (malnutrition).  Have had this condition before.  Are of certain ethnicities. People of African American and Latino or Hispanic descent are at higher risk compared to other ethnic groups.  HOW CAN I PREVENT PRESSURE INJURIES? Skin Care  Keep your skin clean and dry. Gently pat your skin dry.  Do not rub or massage boney areas of your  skin.  Moisturize dry skin.  Use gentle cleansers and skin protectants routinely if you are incontinent.  Check your skin every day for any changes in color and for any new blisters or sores. Make sure to check under and around any medical devices and between skin folds. Have a caregiver do this for you if you are not able. Reducing and Redistributing Pressure  Do not lie or sit in one position for a long time. Move or change position every two hours, or as told by your health care provider.  Use pillows or cushions to redistribute pressure. Ask your health care provider to recommend cushions or pads for you.  Use medical devices that to not rub your skin. Tell your health care provider if one of your medical devices is causing pain or irritation. Medicines  Take over-the-counter and prescription medicines only as told by your health care provider.  If you were prescribed an antibiotic medicine, take it or apply it as told by your health care provider. Do not stop taking or using the antibiotic even if your condition improves. General Instructions  Be as active as you can every day. Ask your health care provider to suggest safe exercises or activities.  Work with your health care provider to manage any chronic health conditions.  Eat a healthy diet that includes lots of protein. Ask your health care provider for diet advice.  Drink enough fluid to keep your urine clear or pale yellow.  Do not abuse drugs or alcohol.    Do not smoke.  Keep all follow-up visits as told by your health care provider. This is important. WHAT STEPS WILL BE TAKEN TO PREVENT PRESSURE INJURIES IF I AM IN THE HOSPITAL? Your health care providers:  Will inspect your skin at least daily. Skin under or around medical devices should be checked at least twice a day while you are in the hospital.  May recommend that you use certain types of bedding to help prevent them. These may include a pad, mattress, or  chair cushion that is filled with gel, air, water, or foam.  Will evaluate your nutrition and consult a diet specialist (dietician), if needed.  Will inspect and change any wound dressings regularly.  May help you move into different positions every few hours.  Will adjust any medical devices and braces as needed to limit pressure on your skin.  Will keep your skin clean and dry.  May use gentle cleansers and skin protectants, if you are incontinent.  Will moisturize any dry skin.  Make sure that you let your health care provider know if you feel or see any changes in your skin. This information is not intended to replace advice given to you by your health care provider. Make sure you discuss any questions you have with your health care provider. Document Released: 07/18/2004 Document Revised: 11/16/2015 Document Reviewed: 03/16/2015 Elsevier Interactive Patient Education  2018 [employer]vier Inc[employer]  

## 2017-11-24 NOTE — Assessment & Plan Note (Signed)
F/u podiatry

## 2017-11-27 ENCOUNTER — Telehealth: Payer: Self-pay | Admitting: *Deleted

## 2017-11-27 DIAGNOSIS — I10 Essential (primary) hypertension: Secondary | ICD-10-CM | POA: Diagnosis not present

## 2017-11-27 DIAGNOSIS — Z96619 Presence of unspecified artificial shoulder joint: Secondary | ICD-10-CM | POA: Diagnosis not present

## 2017-11-27 DIAGNOSIS — M19111 Post-traumatic osteoarthritis, right shoulder: Secondary | ICD-10-CM | POA: Diagnosis not present

## 2017-11-27 DIAGNOSIS — M48061 Spinal stenosis, lumbar region without neurogenic claudication: Secondary | ICD-10-CM | POA: Diagnosis not present

## 2017-11-27 DIAGNOSIS — M19112 Post-traumatic osteoarthritis, left shoulder: Secondary | ICD-10-CM | POA: Diagnosis not present

## 2017-11-27 DIAGNOSIS — S81802D Unspecified open wound, left lower leg, subsequent encounter: Secondary | ICD-10-CM | POA: Diagnosis not present

## 2017-11-27 NOTE — Telephone Encounter (Signed)
Copied from [place] [id number]. Topic: Inquiry >> Nov 26, 2017 11:33 AM Cecelia Byars, NT wrote: Reason for CRM: The patient called to find out if a order for home health has been placed please call her at  [phone]

## 2017-11-28 NOTE — Telephone Encounter (Signed)
Patient notified that referral was placed and she stated a nurse has been for a visit.

## 2017-12-02 NOTE — Telephone Encounter (Signed)
Verbal results given to [facility]

## 2017-12-02 NOTE — Telephone Encounter (Signed)
yes

## 2017-12-02 NOTE — Telephone Encounter (Signed)
Copied from [place] [id number]. Topic: Inquiry >> Dec 02, 2017 11:11 AM Scherrie Gerlach wrote: Reason for CRM: brandy with [facility] calling to advise she did the wound eval today and wants to know if ok with Dr Etter Sjogren to use me salt and a foam dressing to be changed a couple of time a week In addition, order for Nursing:   2 wk 4

## 2017-12-04 DIAGNOSIS — S81802D Unspecified open wound, left lower leg, subsequent encounter: Secondary | ICD-10-CM | POA: Diagnosis not present

## 2017-12-04 DIAGNOSIS — M19111 Post-traumatic osteoarthritis, right shoulder: Secondary | ICD-10-CM | POA: Diagnosis not present

## 2017-12-04 DIAGNOSIS — Z96619 Presence of unspecified artificial shoulder joint: Secondary | ICD-10-CM | POA: Diagnosis not present

## 2017-12-04 DIAGNOSIS — M19112 Post-traumatic osteoarthritis, left shoulder: Secondary | ICD-10-CM | POA: Diagnosis not present

## 2017-12-04 DIAGNOSIS — M48061 Spinal stenosis, lumbar region without neurogenic claudication: Secondary | ICD-10-CM | POA: Diagnosis not present

## 2017-12-04 DIAGNOSIS — I10 Essential (primary) hypertension: Secondary | ICD-10-CM | POA: Diagnosis not present

## 2017-12-05 DIAGNOSIS — H40013 Open angle with borderline findings, low risk, bilateral: Secondary | ICD-10-CM | POA: Diagnosis not present

## 2017-12-08 NOTE — Telephone Encounter (Signed)
K was great when we checked it and that was after the lasix was increased We will check it at next ov --- or if she starts to get more cramping in her leg etc

## 2017-12-08 NOTE — Telephone Encounter (Signed)
Pt called in an wanted to know about her potassium meds mg due to dosage being increased

## 2017-12-09 DIAGNOSIS — M48061 Spinal stenosis, lumbar region without neurogenic claudication: Secondary | ICD-10-CM | POA: Diagnosis not present

## 2017-12-09 DIAGNOSIS — M19111 Post-traumatic osteoarthritis, right shoulder: Secondary | ICD-10-CM | POA: Diagnosis not present

## 2017-12-09 DIAGNOSIS — M19112 Post-traumatic osteoarthritis, left shoulder: Secondary | ICD-10-CM | POA: Diagnosis not present

## 2017-12-09 DIAGNOSIS — S81802D Unspecified open wound, left lower leg, subsequent encounter: Secondary | ICD-10-CM | POA: Diagnosis not present

## 2017-12-09 DIAGNOSIS — Z96619 Presence of unspecified artificial shoulder joint: Secondary | ICD-10-CM | POA: Diagnosis not present

## 2017-12-09 DIAGNOSIS — I10 Essential (primary) hypertension: Secondary | ICD-10-CM | POA: Diagnosis not present

## 2017-12-09 NOTE — Telephone Encounter (Signed)
Patient notified

## 2017-12-12 DIAGNOSIS — S81802D Unspecified open wound, left lower leg, subsequent encounter: Secondary | ICD-10-CM | POA: Diagnosis not present

## 2017-12-12 DIAGNOSIS — M48061 Spinal stenosis, lumbar region without neurogenic claudication: Secondary | ICD-10-CM | POA: Diagnosis not present

## 2017-12-12 DIAGNOSIS — M19111 Post-traumatic osteoarthritis, right shoulder: Secondary | ICD-10-CM | POA: Diagnosis not present

## 2017-12-12 DIAGNOSIS — Z96619 Presence of unspecified artificial shoulder joint: Secondary | ICD-10-CM | POA: Diagnosis not present

## 2017-12-12 DIAGNOSIS — M19112 Post-traumatic osteoarthritis, left shoulder: Secondary | ICD-10-CM | POA: Diagnosis not present

## 2017-12-12 DIAGNOSIS — I10 Essential (primary) hypertension: Secondary | ICD-10-CM | POA: Diagnosis not present

## 2017-12-15 ENCOUNTER — Ambulatory Visit (INDEPENDENT_AMBULATORY_CARE_PROVIDER_SITE_OTHER): Payer: Medicare Other | Admitting: Vascular Surgery

## 2017-12-15 ENCOUNTER — Encounter (INDEPENDENT_AMBULATORY_CARE_PROVIDER_SITE_OTHER): Payer: Self-pay | Admitting: Vascular Surgery

## 2017-12-15 VITALS — BP 103/66 | HR 92 | Resp 16 | Ht 64.5 in | Wt 185.0 lb

## 2017-12-15 DIAGNOSIS — L97929 Non-pressure chronic ulcer of unspecified part of left lower leg with unspecified severity: Secondary | ICD-10-CM

## 2017-12-15 DIAGNOSIS — R600 Localized edema: Secondary | ICD-10-CM

## 2017-12-15 MED ORDER — SILVER SULFADIAZINE 1 % EX CREA
1.0000 | TOPICAL_CREAM | Freq: Every day | CUTANEOUS | 5 refills | Status: DC
Start: 2017-12-15 — End: 2017-12-16

## 2017-12-15 NOTE — Progress Notes (Signed)
Subjective:    Patient ID: Katherine Owen, female    DOB: [birth year], 84 y.o.   MRN: [id number] Chief Complaint  Patient presents with  . New Patient (Initial Visit)    ref Cheri Rous for bil le edema   Presents as a new patient referred by Dr. Cheri Rous for evaluation of bilateral lower extremity edema and wound formation.  Patient seen and examined with multiple family members present.  The patient is nonambulatory and is wheelchair-bound.  The patient has been treated by her family care doctor for bilateral lower extremity edema and wound development to the left lateral calf.  The patient denies any trauma to the wound area however there is a question of her possibly rubbing it against the wheelchair leg.  The wound is now slow to heal.  The patient has also developed a small wound to the second toe on the left foot from rubbing against a pair of shoes.  The patient denies any claudication-like symptoms as she is nonambulatory, rest pain or new/worsening ulceration to the bilateral legs.  The patient has been receiving home nursing services which has been applying Santyl to the left lateral leg wound.  The family has been applying Neosporin to the second toe wound.  The patient has not undergone any arterial studies.  The patient has not received any wound care.  The patient does not engage in any type of conservative therapy at this time including wearing medical grade 1 compression socks or elevating her legs.  The patient denies any fever, nausea or vomiting.  Review of Systems  Constitutional: Negative.   HENT: Negative.   Eyes: Negative.   Respiratory: Negative.   Cardiovascular: Positive for leg swelling.  Gastrointestinal: Negative.   Endocrine: Negative.   Genitourinary: Negative.   Musculoskeletal: Negative.   Skin: Positive for wound.  Allergic/Immunologic: Negative.   Neurological: Negative.   Hematological: Negative.   Psychiatric/Behavioral: Negative.       Objective:   Physical  Exam  Constitutional: She is oriented to person, place, and time. She appears well-developed and well-nourished. No distress.  HENT:  Head: Normocephalic and atraumatic.  Right Ear: External ear normal.  Left Ear: External ear normal.  Eyes: Pupils are equal, round, and reactive to light. Conjunctivae and EOM are normal.  Neck: Normal range of motion.  Cardiovascular: Normal rate, regular rhythm, normal heart sounds and intact distal pulses.  Pulses:      Radial pulses are 2+ on the right side, and 2+ on the left side.  Hard to palpate pedal pulses however the bilateral feet are warm  Pulmonary/Chest: Effort normal and breath sounds normal.  Musculoskeletal: Normal range of motion. She exhibits edema (Minimal right lower extremity edema noted.  Moderate left lower extremity edema noted.).  Neurological: She is alert and oriented to person, place, and time.  Skin: She is not diaphoretic.  Left calf: Lateral aspect with a 4 cm x 3 cm eschar noted. The surrounding tissue is healthy.  There is no cellulitis.  There is no drainage.  There is no foul odor. Left foot second toe: Very small wound without drainage, cellulitis, foul odor surrounding tissue is healthy.  Psychiatric: She has a normal mood and affect. Her behavior is normal. Judgment and thought content normal.  Vitals reviewed.  BP 103/66 (BP Location: Left Arm)   Pulse 92   Resp 16   Ht 5' 4.5" (1.638 m)   Wt 185 lb (83.9 kg)   BMI 31.26 kg/m   Past  Medical History:  Diagnosis Date  . Allergy   . Arthritis   . Cataract    Bilateral  . Essential hypertension 05/02/2014  . Headache   . History of shingles   . HLD (hyperlipidemia) 05/02/2014  . Hyperlipidemia   . Hypertension   . Neuropathic pain 05/02/2014  . Osteoarthritis of both shoulders due to rotator cuff injury 04/23/2016  . Poor mobility 08/28/2015  . 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 Spinal stenosis of lumbar region 05/02/2014   S/p  decompression [facility] November 2015    Social History   Socioeconomic History  . Marital status: Widowed    Spouse name: Not on file  . Number of children: 3  . Years of education: Not on file  . Highest education level: Not on file  Occupational History  . Occupation: Microbiologist  Social Needs  . Financial resource strain: Not on file  . Food insecurity:    Worry: Not on file    Inability: Not on file  . Transportation needs:    Medical: Not on file    Non-medical: Not on file  Tobacco Use  . Smoking status: Former Smoker    Packs/day: 1.00    Years: 20.00    Pack years: 20.00    Types: Cigarettes    Last attempt to quit: 04/16/1967    Years since quitting: 50.7  . Smokeless tobacco: Never Used  . Tobacco comment: quit somking in early  1970's  Substance and Sexual Activity  . Alcohol use: No    Alcohol/week: 0.0 oz  . Drug use: No  . Sexual activity: Not Currently  Lifestyle  . Physical activity:    Days per week: Not on file    Minutes per session: Not on file  . Stress: Not on file  Relationships  . Social connections:    Talks on phone: Not on file    Gets together: Not on file    Attends religious service: Not on file    Active member of club or organization: Not on file    Attends meetings of clubs or organizations: Not on file    Relationship status: Not on file  . Intimate partner violence:    Fear of current or ex partner: Not on file    Emotionally abused: Not on file    Physically abused: Not on file    Forced sexual activity: Not on file  Other Topics Concern  . Not on file  Social History Narrative  . Not on file   Past Surgical History:  Procedure Laterality Date  . ANKLE SURGERY Left 2014  . BACK SURGERY  2015   [facility]   . BUNIONECTOMY Right   . COLONOSCOPY    . EYE SURGERY     cataract  . REVERSE SHOULDER ARTHROPLASTY Right 01/11/2016   Procedure: REVERSE SHOULDER ARTHROPLASTY;  Surgeon: Justice Britain, MD;   Location: [facility];  Service: Orthopedics;  Laterality: Right;  . SHOULDER SURGERY  01/11/2016   REVERSE SHOULDER ARTHROPLASTY on 01/11/2016  . SPINE SURGERY     done at [facility] Apr 26, 2014, lumbar decompression  . TOTAL HIP ARTHROPLASTY Bilateral 2005, 2009   [place] , [place]   Family History  Problem Relation Age of Onset  . Arthritis Mother        osteoarthritis  . Colon cancer Mother   . Arthritis Father        osteoarthritis  . Parkinson's disease Father   .  Anesthesia problems Neg Hx    Allergies  Allergen Reactions  . No Known Allergies       Assessment & Plan:  Presents as a new patient referred by Dr. Cheri Rous for evaluation of bilateral lower extremity edema and wound formation.  Patient seen and examined with multiple family members present.  The patient is nonambulatory and is wheelchair-bound.  The patient has been treated by her family care doctor for bilateral lower extremity edema and wound development to the left lateral calf.  The patient denies any trauma to the wound area however there is a question of her possibly rubbing it against the wheelchair leg.  The wound is now slow to heal.  The patient has also developed a small wound to the second toe on the left foot from rubbing against a pair of shoes.  The patient denies any claudication-like symptoms as she is nonambulatory, rest pain or new/worsening ulceration to the bilateral legs.  The patient has been receiving home nursing services which has been applying Santyl to the left lateral leg wound.  The family has been applying Neosporin to the second toe wound.  The patient has not undergone any arterial studies.  The patient has not received any wound care.  The patient does not engage in any type of conservative therapy at this time including wearing medical grade 1 compression socks or elevating her legs.  The patient denies any fever, nausea or vomiting.  1. Ulcer of left lower extremity, unspecified ulcer stage ([place]) -  New Unable to palpate pedal pulses on exam. Patient with multiple risk factors for peripheral artery disease Patient with slow healing wound to the left lateral calf.  I will order an ABI to assess the patient's arterial status I will also refer the patient to the wound center for wound care recommendations to the calf/toe wound I prescribed Silvadene 2% apply to left toe wound daily  - US Arterial Seg Multiple; Future - Ambulatory referral to Wound Clinic  2. Lower extremity edema - New Recommend 3 layer zinc oxide Unna wraps to the bilateral lower extremity in an effort to gain control the patient's edema. Controlling the patient's edema will also assist in wound healing / prevent further wounds from developing Once the patient's wounds are healed and her edema is controlled my plan will be to transition her into medical grade 1 compression socks. I will bring the patient back and have her undergo a bilateral lower extremity venous duplex to rule out any venous versus lymphatic disease. I had a long discussion with the patient about the importance of elevation and the need to elevate as much as possible even during the day. The patient's visiting nurse can continue weekly Unna boot changes to the bilateral lower extremity. The patient is to follow-up in 1 month so I can assess her progress with Unna boot therapy.  - VAS [place] LOWER EXTREMITY VENOUS REFLUX; Future  Current Outpatient Medications on File Prior to Visit  Medication Sig Dispense Refill  . atorvastatin (LIPITOR) 10 MG tablet Take 1 tablet (10 mg total) by mouth at bedtime. For high cholesterol 90 tablet 1  . celecoxib (CELEBREX) 200 MG capsule Take 1 capsule (200 mg total) by mouth daily. 100 capsule 0  . Cholecalciferol (VITAMIN D3) 5000 units CAPS Take 1 capsule by mouth at bedtime.    Marland Kitchen doxycycline (VIBRA-TABS) 100 MG tablet Take 1 tablet (100 mg total) by mouth 2 (two) times daily. 20 tablet 0  . esomeprazole (NEXIUM)  20 MG  capsule Take 20 mg by mouth daily before breakfast.    . famotidine (PEPCID) 10 MG tablet Chew 10 mg at bedtime as needed by mouth for heartburn.    . furosemide (LASIX) 40 MG tablet Take 1 tablet (40 mg total) by mouth daily. 90 tablet 1  . gabapentin (NEURONTIN) 100 MG capsule Take 1 capsule (100 mg total) by mouth 3 (three) times daily. 270 capsule 1  . lidocaine (XYLOCAINE) 4 % external solution Apply topically daily as needed.    Marland Kitchen losartan (COZAAR) 100 MG tablet Take 1 tablet (100 mg total) by mouth daily. 90 tablet 1  . Misc. Devices [facility]) MISC Use as directed 1 each 0  . montelukast (SINGULAIR) 10 MG tablet Take 1 tablet (10 mg total) by mouth at bedtime. 90 tablet 1  . Multiple Vitamins-Minerals (CENTRUM SILVER) tablet Take 1 tablet by mouth daily.    . NONFORMULARY OR COMPOUNDED ITEM Lift recliner #1  As directed  Dx spinal stenosis, osteoarthritis both shoulder ,  Low ext weakness 1 each 0  . NONFORMULARY OR COMPOUNDED ITEM velcro compression hose  #1    Dx edema 1 each 0  . pantoprazole (PROTONIX) 40 MG tablet Take 1 tablet (40 mg total) by mouth daily. 90 tablet 1  . potassium chloride (K-DUR) 10 MEQ tablet Take 1 tablet (10 mEq total) by mouth daily. With each dose of lasix 90 tablet 1  . senna-docusate (SENOKOT-S) 8.6-50 MG per tablet Take 2 tablets by mouth 2 (two) times daily. For constipation     . Simethicone (GAS RELIEF 80 PO) Take 1 tablet daily as needed by mouth (for bloating).    . traMADol (ULTRAM) 50 MG tablet Take 1 tablet (50 mg total) by mouth 3 (three) times daily. 270 tablet 1   No current facility-administered medications on file prior to visit.    There are no Patient Instructions on file for this visit. No follow-ups on file.  Huxley Vanwagoner A Damaso Laday, PA-C

## 2017-12-16 ENCOUNTER — Telehealth: Payer: Self-pay | Admitting: Family Medicine

## 2017-12-16 ENCOUNTER — Other Ambulatory Visit (INDEPENDENT_AMBULATORY_CARE_PROVIDER_SITE_OTHER): Payer: Self-pay

## 2017-12-16 DIAGNOSIS — M48061 Spinal stenosis, lumbar region without neurogenic claudication: Secondary | ICD-10-CM | POA: Diagnosis not present

## 2017-12-16 DIAGNOSIS — S81802D Unspecified open wound, left lower leg, subsequent encounter: Secondary | ICD-10-CM | POA: Diagnosis not present

## 2017-12-16 DIAGNOSIS — M19111 Post-traumatic osteoarthritis, right shoulder: Secondary | ICD-10-CM | POA: Diagnosis not present

## 2017-12-16 DIAGNOSIS — Z96619 Presence of unspecified artificial shoulder joint: Secondary | ICD-10-CM | POA: Diagnosis not present

## 2017-12-16 DIAGNOSIS — I10 Essential (primary) hypertension: Secondary | ICD-10-CM | POA: Diagnosis not present

## 2017-12-16 DIAGNOSIS — M19112 Post-traumatic osteoarthritis, left shoulder: Secondary | ICD-10-CM | POA: Diagnosis not present

## 2017-12-16 MED ORDER — SILVER SULFADIAZINE 1 % EX CREA: 1.0000 "application " | TOPICAL_CREAM | Freq: Every day | CUTANEOUS | 5 refills | Status: DC

## 2017-12-16 NOTE — Telephone Encounter (Signed)
Copied from [place] [id number]. Topic: Quick Communication - See Telephone Encounter >> Dec 16, 2017 12:31 PM Conception Chancy, NT wrote: CRM for notification. See Telephone encounter for: 12/16/17.  Patient is calling and needing refills on the following prescriptions.  losartan (COZAAR) 100 MG tablet  traMADol (ULTRAM) 50 MG tablet   furosemide (LASIX) 40 MG tablet  montelukast (SINGULAIR) 10 MG tablet   [facility] - [place], [place] - [address] [place] [address] Phone: [phone] Fax: [phone]

## 2017-12-16 NOTE — Telephone Encounter (Signed)
I sent the medication over to pharmacy electronically about an hour ago. I will call to confirm that they got it.

## 2017-12-16 NOTE — Telephone Encounter (Signed)
Patient's son called stating the prescription for Silvadene cream needed to be called into the patient's pharmacy at [place], [place]. This was called in with 5 refills.

## 2017-12-17 ENCOUNTER — Other Ambulatory Visit: Payer: Medicare Other

## 2017-12-17 ENCOUNTER — Other Ambulatory Visit: Payer: Self-pay | Admitting: *Deleted

## 2017-12-17 DIAGNOSIS — Z79899 Other long term (current) drug therapy: Secondary | ICD-10-CM

## 2017-12-17 DIAGNOSIS — M159 Polyosteoarthritis, unspecified: Secondary | ICD-10-CM

## 2017-12-17 DIAGNOSIS — M150 Primary generalized (osteo)arthritis: Secondary | ICD-10-CM

## 2017-12-17 NOTE — Telephone Encounter (Signed)
Advised pt to contact pharmacy for refills of the requested medications. Prescriptions for medications were previously sent to the requested pharmacy with refills. Pt verbalized understanding.

## 2017-12-18 ENCOUNTER — Encounter: Payer: Medicare Other | Attending: Nurse Practitioner | Admitting: Nurse Practitioner

## 2017-12-18 ENCOUNTER — Ambulatory Visit
Admission: RE | Admit: 2017-12-18 | Discharge: 2017-12-18 | Disposition: A | Discharge status: Home / Self Care | Payer: Medicare Other | Source: Ambulatory Visit | Attending: Vascular Surgery | Admitting: Vascular Surgery

## 2017-12-18 DIAGNOSIS — I10 Essential (primary) hypertension: Secondary | ICD-10-CM | POA: Insufficient documentation

## 2017-12-18 DIAGNOSIS — G629 Polyneuropathy, unspecified: Secondary | ICD-10-CM | POA: Diagnosis not present

## 2017-12-18 DIAGNOSIS — Z993 Dependence on wheelchair: Secondary | ICD-10-CM | POA: Diagnosis not present

## 2017-12-18 DIAGNOSIS — Z87891 Personal history of nicotine dependence: Secondary | ICD-10-CM | POA: Diagnosis not present

## 2017-12-18 DIAGNOSIS — I739 Peripheral vascular disease, unspecified: Secondary | ICD-10-CM | POA: Insufficient documentation

## 2017-12-18 DIAGNOSIS — I872 Venous insufficiency (chronic) (peripheral): Secondary | ICD-10-CM | POA: Diagnosis not present

## 2017-12-18 DIAGNOSIS — S81801A Unspecified open wound, right lower leg, initial encounter: Secondary | ICD-10-CM | POA: Diagnosis not present

## 2017-12-18 DIAGNOSIS — L89899 Pressure ulcer of other site, unspecified stage: Secondary | ICD-10-CM | POA: Insufficient documentation

## 2017-12-18 DIAGNOSIS — Z7401 Bed confinement status: Secondary | ICD-10-CM | POA: Diagnosis not present

## 2017-12-18 DIAGNOSIS — L97929 Non-pressure chronic ulcer of unspecified part of left lower leg with unspecified severity: Secondary | ICD-10-CM | POA: Insufficient documentation

## 2017-12-18 DIAGNOSIS — L97222 Non-pressure chronic ulcer of left calf with fat layer exposed: Secondary | ICD-10-CM | POA: Diagnosis not present

## 2017-12-18 DIAGNOSIS — Z823 Family history of stroke: Secondary | ICD-10-CM | POA: Insufficient documentation

## 2017-12-18 DIAGNOSIS — L97829 Non-pressure chronic ulcer of other part of left lower leg with unspecified severity: Secondary | ICD-10-CM | POA: Diagnosis not present

## 2017-12-18 DIAGNOSIS — L97522 Non-pressure chronic ulcer of other part of left foot with fat layer exposed: Secondary | ICD-10-CM | POA: Diagnosis not present

## 2017-12-18 DIAGNOSIS — M1990 Unspecified osteoarthritis, unspecified site: Secondary | ICD-10-CM | POA: Diagnosis not present

## 2017-12-19 ENCOUNTER — Telehealth: Payer: Self-pay | Admitting: *Deleted

## 2017-12-19 LAB — PAIN MGMT, PROFILE 8 W/CONF, U
6 ACETYLMORPHINE: NEGATIVE ng/mL (ref <10)
ALCOHOL METABOLITES: NEGATIVE ng/mL (ref <500)
Amphetamines: NEGATIVE ng/mL (ref <500)
BENZODIAZEPINES: NEGATIVE ng/mL (ref <100)
BUPRENORPHINE, URINE: NEGATIVE ng/mL (ref <5)
Cocaine Metabolite: NEGATIVE ng/mL (ref <150)
Creatinine: 61.9 mg/dL
MDMA: NEGATIVE ng/mL (ref <500)
Marijuana Metabolite: NEGATIVE ng/mL (ref <20)
OPIATES: NEGATIVE ng/mL (ref <100)
OXIDANT: NEGATIVE ug/mL (ref <200)
OXYCODONE: NEGATIVE ng/mL (ref <100)
pH: 6.11 (ref 4.5–9.0)

## 2017-12-19 LAB — PAIN MGMT, TRAMADOL W/MEDMATCH, U
Desmethyltramadol: 4164 ng/mL — ABNORMAL HIGH (ref <100)
Tramadol: 13428 ng/mL — ABNORMAL HIGH (ref <100)

## 2017-12-19 NOTE — Telephone Encounter (Signed)
Received Home Health Certification and Plan of Care; forwarded to provider/SLS 06/28

## 2017-12-20 NOTE — Progress Notes (Signed)
MAIYA, KATES ([id number]) Visit Report for 12/18/2017 Abuse/Suicide Risk Screen Details Patient Name: Katherine Owen, Katherine Owen Date of Service: 12/18/2017 2:30 PM Medical Record Number: [id number] Patient Account Number: [id number] Date of Birth/Sex: May 29, 1928 (84 y.o. F) Treating RN: Montey Hora Primary Care Safiyah Cisney: Carollee Herter, Kendrick Fries Other Clinician: Referring Gevon Markus: Marcelle Overlie Treating Candice Tobey/Extender: Cathie Olden in Treatment: 0 Abuse/Suicide Risk Screen Items Answer ABUSE/SUICIDE RISK SCREEN: Has anyone close to you tried to hurt or harm you recentlyo No Do you feel uncomfortable with anyone in your familyo No Has anyone forced you do things that you didnot want to doo No Do you have any thoughts of harming yourselfo No Patient displays signs or symptoms of abuse and/or neglect. No Electronic Signature(s) Signed: 12/18/2017 4:38:21 PM By: Montey Hora Entered By: Montey Hora on 12/18/2017 15:10:48 Aplin, Tayonna ([id number]) -------------------------------------------------------------------------------- Activities of Daily Living Details Patient Name: Katherine Owen Date of Service: 12/18/2017 2:30 PM Medical Record Number: [id number] Patient Account Number: [id number] Date of Birth/Sex: 10/21/1927 (84 y.o. F) Treating RN: Montey Hora Primary Care Darrnell Mangiaracina: Carollee Herter, Kendrick Fries Other Clinician: Referring Arzell Mcgeehan: Marcelle Overlie Treating Orie Cuttino/Extender: Cathie Olden in Treatment: 0 Activities of Daily Living Items Answer Activities of Daily Living (Please select one for each item) Drive Automobile Not Able Take Medications Completely Able Use Telephone Completely Able Care for Appearance Need Assistance Use Toilet Need Assistance Bath / Shower Need Assistance Dress Self Need Assistance Feed Self Completely Able Walk Not Able Get In / Out Bed Need Assistance Housework Need [place] for Self Need Assistance Electronic Signature(s) Signed: 12/18/2017 4:38:21 PM By: Montey Hora Entered By: Montey Hora on 12/18/2017 15:11:21 Urquilla, [name] ([id number]) -------------------------------------------------------------------------------- Education Assessment Details Patient Name: Katherine Owen Date of Service: 12/18/2017 2:30 PM Medical Record Number: [id number] Patient Account Number: [id number] Date of Birth/Sex: Jul 28, 1927 (84 y.o. F) Treating RN: Montey Hora Primary Care Dearia Wilmouth: Carollee Herter, Kendrick Fries Other Clinician: Referring Leydy Worthey: Marcelle Overlie Treating Meeyah Ovitt/Extender: Cathie Olden in Treatment: 0 Primary Learner Assessed: Patient Learning Preferences/Education Level/Primary Language Learning Preference: Explanation, Demonstration Highest Education Level: High School Preferred Language: English Cognitive Barrier Assessment/Beliefs Language Barrier: No Translator Needed: No Memory Deficit: No Emotional Barrier: No Cultural/Religious Beliefs Affecting Medical Care: No Physical Barrier Assessment Impaired Vision: No Impaired Hearing: No Decreased Hand dexterity: No Knowledge/Comprehension Assessment Knowledge Level: Medium Comprehension Level: Medium Ability to understand written Medium instructions: Ability to understand verbal Medium instructions: Motivation Assessment Anxiety Level: Calm Cooperation: Cooperative Education Importance: Acknowledges Need Interest in Health Problems: Asks Questions Perception: Coherent Willingness to Engage in Self- Medium Management Activities: Readiness to Engage in Self- Medium Management Activities: Electronic Signature(s) Signed: 12/18/2017 4:38:21 PM By: Montey Hora Entered By: Montey Hora on 12/18/2017 15:11:42 Kostick, Raechell ([id number]) -------------------------------------------------------------------------------- Fall Risk Assessment  Details Patient Name: Katherine Owen Date of Service: 12/18/2017 2:30 PM Medical Record Number: [id number] Patient Account Number: [id number] Date of Birth/Sex: Oct 11, 1927 (84 y.o. F) Treating RN: Montey Hora Primary Care Mima Cranmore: Carollee Herter, Kendrick Fries Other Clinician: Referring Roshun Klingensmith: Marcelle Overlie Treating Bethzaida Boord/Extender: Cathie Olden in Treatment: 0 Fall Risk Assessment Items Have you had 2 or more falls in the last 12 monthso 0 No Have you had any fall that resulted in injury in the last 12 monthso 0 No FALL RISK ASSESSMENT: History of falling - immediate or within 3 months 0 No Secondary diagnosis 0 No Ambulatory aid None/bed rest/wheelchair/nurse 0 Yes Crutches/cane/walker 0 No Furniture 0 No IV Access/Saline Lock 0 No Gait/Training Normal/bed  rest/immobile 0 No Weak 10 Yes Impaired 20 Yes Mental Status Oriented to own ability 0 Yes Electronic Signature(s) Signed: 12/18/2017 4:38:21 PM By: Montey Hora Entered By: Montey Hora on 12/18/2017 15:12:09 Jankowiak, Amyiah ([id number]) -------------------------------------------------------------------------------- Foot Assessment Details Patient Name: Katherine Owen Date of Service: 12/18/2017 2:30 PM Medical Record Number: [id number] Patient Account Number: [id number] Date of Birth/Sex: 1927/08/12 (84 y.o. F) Treating RN: Montey Hora Primary Care Joeann Steppe: Carollee Herter, Kendrick Fries Other Clinician: Referring Zane Samson: Marcelle Overlie Treating Oreoluwa Gilmer/Extender: Cathie Olden in Treatment: 0 Foot Assessment Items Site Locations + = Sensation present, - = Sensation absent, C = Callus, U = Ulcer R = Redness, W = Warmth, M = Maceration, PU = Pre-ulcerative lesion F = Fissure, S = Swelling, D = Dryness Assessment Right: Left: Other Deformity: No No Prior Foot Ulcer: No No Prior Amputation: No No Charcot Joint: No No Ambulatory Status: Non-ambulatory Assistance Device: Wheelchair Gait:  Administrator, arts) Signed: 12/18/2017 4:38:21 PM By: Montey Hora Entered By: Montey Hora on 12/18/2017 15:14:59 Vien, Nareh ([id number]) -------------------------------------------------------------------------------- Nutrition Risk Assessment Details Patient Name: Katherine Owen Date of Service: 12/18/2017 2:30 PM Medical Record Number: [id number] Patient Account Number: [id number] Date of Birth/Sex: 05/23/1928 (84 y.o. F) Treating RN: Montey Hora Primary Care Arpita Fentress: Carollee Herter, Kendrick Fries Other Clinician: Referring Prashant Glosser: Marcelle Overlie Treating Jesiah Yerby/Extender: Cathie Olden in Treatment: 0 Height (in): Weight (lbs): Body Mass Index (BMI): Nutrition Risk Assessment Items NUTRITION RISK SCREEN: I have an illness or condition that made me change the kind and/or amount of 0 No food I eat I eat fewer than two meals per day 0 No I eat few fruits and vegetables, or milk products 0 No I have three or more drinks of beer, liquor or wine almost every day 0 No I have tooth or mouth problems that make it hard for me to eat 0 No I don't always have enough money to buy the food I need 0 No I eat alone most of the time 0 No I take three or more different prescribed or over-the-counter drugs a day 1 Yes Without wanting to, I have lost or gained 10 pounds in the last six months 0 No I am not always physically able to shop, cook and/or feed myself 0 No Nutrition Protocols Good Risk Protocol 0 No interventions needed Moderate Risk Protocol Electronic Signature(s) Signed: 12/18/2017 4:38:21 PM By: Montey Hora Entered By: Montey Hora on 12/18/2017 15:12:18

## 2017-12-22 ENCOUNTER — Telehealth: Payer: Self-pay | Admitting: *Deleted

## 2017-12-22 ENCOUNTER — Other Ambulatory Visit: Payer: Self-pay | Admitting: Family Medicine

## 2017-12-22 DIAGNOSIS — M159 Polyosteoarthritis, unspecified: Secondary | ICD-10-CM

## 2017-12-22 DIAGNOSIS — M150 Primary generalized (osteo)arthritis: Principal | ICD-10-CM

## 2017-12-22 DIAGNOSIS — R1013 Epigastric pain: Secondary | ICD-10-CM

## 2017-12-22 NOTE — Telephone Encounter (Signed)
Copied from [place] [id number]. Topic: General - Other >> Dec 22, 2017 10:28 AM Yvette Rack wrote: Reason for CRM: Pt son Cloria Spring states pt has about 4 to 5 sores with one being on her buttocks. [name] states pt has been in wound care however she is completely wheel chair bound and he feels pt is in need of more care. [name] states pt may need to be in the hospital or a nursing home. Allen requests call back to discuss options of care. Cb# [phone]

## 2017-12-22 NOTE — Telephone Encounter (Signed)
I see that Dr. Etter Sjogren made a referral to home health for wound care last visit. Has the nurse been coming to help them?  I think that pt needs to be re-evaluated in the office please so we can check status of wounds.

## 2017-12-22 NOTE — Telephone Encounter (Signed)
Son making appt to be seen this week.

## 2017-12-22 NOTE — Telephone Encounter (Signed)
Copied from [place] [id number]. Topic: Quick Communication - Rx Refill/Question >> Dec 22, 2017  1:43 PM Lysle Morales L, NT wrote: Medication:  gabapentin (NEURONTIN) 100 MG capsule , montelukast (SINGULAIR) 10 MG tablet and also traMADol (ULTRAM) 50 MG tablet and  pantoprazole (PROTONIX) 40 MG tablet   Has the patient contacted their pharmacy? no: (Agent: If no, request that the patient contact the pharmacy for the refill. (Agent: If yes, when and what did the pharmacy advise?  Preferred Pharmacy (with phone number or street name): [facility], [address] [phone] (Phone) [phone] (Fax)      Agent: Please be advised that RX refills may take up to 3 business days. We ask that you follow-up with your pharmacy.

## 2017-12-23 DIAGNOSIS — M19111 Post-traumatic osteoarthritis, right shoulder: Secondary | ICD-10-CM | POA: Diagnosis not present

## 2017-12-23 DIAGNOSIS — I10 Essential (primary) hypertension: Secondary | ICD-10-CM | POA: Diagnosis not present

## 2017-12-23 DIAGNOSIS — Z96619 Presence of unspecified artificial shoulder joint: Secondary | ICD-10-CM | POA: Diagnosis not present

## 2017-12-23 DIAGNOSIS — S81802D Unspecified open wound, left lower leg, subsequent encounter: Secondary | ICD-10-CM | POA: Diagnosis not present

## 2017-12-23 DIAGNOSIS — M19112 Post-traumatic osteoarthritis, left shoulder: Secondary | ICD-10-CM | POA: Diagnosis not present

## 2017-12-23 DIAGNOSIS — M48061 Spinal stenosis, lumbar region without neurogenic claudication: Secondary | ICD-10-CM | POA: Diagnosis not present

## 2017-12-23 NOTE — Telephone Encounter (Signed)
LOV  11/24/17 Dr. Nani Ravens

## 2017-12-23 NOTE — Progress Notes (Signed)
DARETH, ANDREW ([id number]) Visit Report for 12/18/2017 Chief Complaint Document Details Patient Name: Katherine Owen, Katherine Owen Date of Service: 12/18/2017 2:30 PM Medical Record Number: [id number] Patient Account Number: [id number] Date of Birth/Sex: 03-18-1928 (84 y.o. F) Treating RN: Ahmed Prima Primary Care Provider: Roma Schanz Other Clinician: Referring Provider: Marcelle Overlie Treating Provider/Extender: Cathie Olden in Treatment: 0 Information Obtained from: Patient Chief Complaint left lower extremity ulcer Electronic Signature(s) Signed: 12/18/2017 8:16:15 PM By: Lawanda Cousins Entered By: Lawanda Cousins on 12/18/2017 20:16:15 Ognibene, [name] ([id number]) -------------------------------------------------------------------------------- Debridement Details Patient Name: Katherine Owen Date of Service: 12/18/2017 2:30 PM Medical Record Number: [id number] Patient Account Number: [id number] Date of Birth/Sex: 03/27/1928 (84 y.o. F) Treating RN: Ahmed Prima Primary Care Provider: Carollee Herter, Kendrick Fries Other Clinician: Referring Provider: Marcelle Overlie Treating Provider/Extender: Cathie Olden in Treatment: 0 Debridement Performed for Wound #1 Left,Lateral Lower Leg Assessment: Performed By: Physician Lawanda Cousins, NP Debridement Type: Debridement Severity of Tissue Pre Fat layer exposed Debridement: Pre-procedure Verification/Time Yes - 15:41 Out Taken: Start Time: 15:41 Pain Control: Lidocaine 4% Topical Solution Total Area Debrided (L x W): 2.9 (cm) x 1.7 (cm) = 4.93 (cm) Tissue and other material Eschar, [place] debrided: Level: Non-Viable Tissue Debridement Description: Selective/Open Wound Instrument: Blade, Forceps Bleeding: Minimum Hemostasis Achieved: Pressure End Time: 15:46 Procedural Pain: 0 Post Procedural Pain: 0 Response to Treatment: Procedure was tolerated well Level of Consciousness: Awake and Alert Post  Procedure Vitals: Temperature: 98.8 Pulse: 82 Respiratory Rate: 16 Blood Pressure: Systolic Blood Pressure: 193 Diastolic Blood Pressure: 57 Post Debridement Measurements of Total Wound Length: (cm) 2.9 Width: (cm) 1.7 Depth: (cm) 0.2 Volume: (cm) 0.774 Character of Wound/Ulcer Post Debridement: Requires Further Debridement Severity of Tissue Post Debridement: Fat layer exposed Post Procedure Diagnosis Same as Pre-procedure Electronic Signature(s) Signed: 12/18/2017 9:32:27 PM By: Lawanda Cousins Signed: 12/22/2017 5:11:46 PM By: Kendal Hymen, Nura ([id number]) Entered By: Alric Quan on 12/18/2017 15:47:10 Haver, Carri ([id number]) -------------------------------------------------------------------------------- HPI Details Patient Name: Katherine Owen Date of Service: 12/18/2017 2:30 PM Medical Record Number: [id number] Patient Account Number: [id number] Date of Birth/Sex: Sep 21, 1927 (84 y.o. F) Treating RN: Ahmed Prima Primary Care Provider: Roma Schanz Other Clinician: Referring Provider: Marcelle Overlie Treating Provider/Extender: Cathie Olden in Treatment: 0 History of Present Illness HPI Description: 12/18/17-She is here for initial evaluation of a left posterior calf pressure ulcer, she suspects from the foot rest of her wheelchair. She is wheelchair/bed bound. She says it has been present for approximately 1 month, beginning as a blister. They have been using silvadene topically. She did have an arterial study today but the results are not available for review. She denies pain at rest, admits to pain with pressure and manipulation. She has been wearing Unna boots and tolerating but there are areas of erythema, concerning for pressure to the bilateral anterior ankle and dorsal aspect of the right foot; we will hold off on ace wrap and use ace wraps at this time. She lives with family and has home health. She is non-diabetic and  does not smoke. Electronic Signature(s) Signed: 12/18/2017 8:33:13 PM By: Lawanda Cousins Previous Signature: 12/18/2017 8:21:38 PM Version By: Lawanda Cousins Entered By: Lawanda Cousins on 12/18/2017 20:33:13 [place], [name] ([id number]) -------------------------------------------------------------------------------- Physical Exam Details Patient Name: Katherine Owen Date of Service: 12/18/2017 2:30 PM Medical Record Number: [id number] Patient Account Number: [id number] Date of Birth/Sex: 1928/03/20 (84 y.o. F) Treating RN: Ahmed Prima Primary Care Provider: Roma Schanz Other Clinician: Referring Provider: Marcelle Overlie Treating Provider/Extender: Lawanda Cousins  Weeks in Treatment: 0 Respiratory respirations are even and unlabored. Cardiovascular LLE- non-palpable pedal pulse. LLE- trace edema, warm to touch, adequate cap refill. Musculoskeletal wheelchair/bed bound; hoyer lift. Psychiatric oriented x4. calm, cooperative. Electronic Signature(s) Signed: 12/18/2017 8:27:49 PM By: Lawanda Cousins Entered By: Lawanda Cousins on 12/18/2017 20:27:49 Katherine Owen ([id number]) -------------------------------------------------------------------------------- Physician Orders Details Patient Name: Katherine Owen Date of Service: 12/18/2017 2:30 PM Medical Record Number: [id number] Patient Account Number: [id number] Date of Birth/Sex: Jan 30, 1928 (84 y.o. F) Treating RN: Ahmed Prima Primary Care Provider: Carollee Herter, Kendrick Fries Other Clinician: Referring Provider: Marcelle Overlie Treating Provider/Extender: Cathie Olden in Treatment: 0 Verbal / Phone Orders: Yes Clinician: Pinkerton, Debi Read Back and Verified: Yes Diagnosis Coding Wound Cleansing Wound #1 Left,Lateral Lower Leg o Clean wound with Normal Saline. o Cleanse wound with mild soap and water Wound #2 Left Toe Second o Clean wound with Normal Saline. o Cleanse wound with mild soap and  water Wound #3 Right,Posterior Lower Leg o Clean wound with Normal Saline. o Cleanse wound with mild soap and water Anesthetic (add to Medication List) Wound #1 Left,Lateral Lower Leg o Topical Lidocaine 4% cream applied to wound bed prior to debridement (In Clinic Only). Wound #2 Left Toe Second o Topical Lidocaine 4% cream applied to wound bed prior to debridement (In Clinic Only). Wound #3 Right,Posterior Lower Leg o Topical Lidocaine 4% cream applied to wound bed prior to debridement (In Clinic Only). Primary Wound Dressing Wound #1 Left,Lateral Lower Leg o Saline moistened gauze o Santyl Ointment Wound #2 Left Toe Second o Hydrafera Blue Ready Transfer Wound #3 Right,Posterior Lower Leg o Hydrafera Blue Ready Transfer Secondary Dressing Wound #1 Left,Lateral Lower Leg o Dry [place] Wound #2 Left Toe Second o Dry [place] ([id number]) Wound #3 Right,Posterior Lower Leg o [place] Dressing Change Frequency Wound #1 Left,Lateral Lower Leg o Change dressing every day. Wound #2 Left Toe Second o Change dressing every other day. Wound #3 Right,Posterior Lower Leg o Change dressing every other day. Follow-up Appointments Wound #1 Left,Lateral Lower Leg o Return Appointment in 2 weeks. Wound #2 Left Toe Second o Return Appointment in 2 weeks. Wound #3 Right,Posterior Lower Leg o Return Appointment in 2 weeks. Edema Control Wound #1 Left,Lateral Lower Leg o Elevate legs to the level of the heart and pump ankles as often as possible o Other: - ace wrap for edema Wound #2 Left Toe Second o Elevate legs to the level of the heart and pump ankles as often as possible o Other: - ace wrap for edema Wound #3 Right,Posterior Lower Leg o Elevate legs to the level of the heart and pump ankles as often as possible o Other: - ace wrap for edema Off-Loading Wound  #1 Left,Lateral Lower Leg o Turn and reposition every 2 hours Wound #2 Left Toe Second o Turn and reposition every 2 hours Wound #3 Right,Posterior Lower Leg o Turn and reposition every 2 hours Additional Orders / Instructions Wound #1 Left,Lateral Lower Leg o Increase protein intake. Wound #2 Left Toe Second o Increase protein intake. Wound #3 Right,Posterior Lower Leg Bortner, Abigaile ([id number]) o Increase protein intake. Home Health Wound #1 [place] Nurse may visit PRN to address patientos wound care needs. o FACE TO FACE ENCOUNTER: MEDICARE and MEDICAID PATIENTS: I certify that this patient is under my care and that I had a face-to-face encounter that meets  the physician face-to-face encounter requirements with this patient on this date. The encounter with the patient was in whole or in part for the following MEDICAL CONDITION: (primary reason for [place]) MEDICAL NECESSITY: I certify, that based on my findings, NURSING services are a medically necessary home health service. HOME BOUND STATUS: I certify that my clinical findings support that this patient is homebound (i.e., Due to illness or injury, pt requires aid of supportive devices such as crutches, cane, wheelchairs, walkers, the use of special transportation or the assistance of another person to leave their place of residence. There is a normal inability to leave the home and doing so requires considerable and taxing effort. Other absences are for medical reasons / religious services and are infrequent or of short duration when for other reasons). o If current dressing causes regression in wound condition, may D/C ordered dressing product/s and apply Normal Saline Moist Dressing daily until next [place] / Other MD appointment. [place] of regression in wound condition at [phone]. o Please direct any  NON-WOUND related issues/requests for orders to patient's Primary Care Physician Wound #2 Left Toe Second o [place] Nurse may visit PRN to address patientos wound care needs. o FACE TO FACE ENCOUNTER: MEDICARE and MEDICAID PATIENTS: I certify that this patient is under my care and that I had a face-to-face encounter that meets the physician face-to-face encounter requirements with this patient on this date. The encounter with the patient was in whole or in part for the following MEDICAL CONDITION: (primary reason for [place]) MEDICAL NECESSITY: I certify, that based on my findings, NURSING services are a medically necessary home health service. HOME BOUND STATUS: I certify that my clinical findings support that this patient is homebound (i.e., Due to illness or injury, pt requires aid of supportive devices such as crutches, cane, wheelchairs, walkers, the use of special transportation or the assistance of another person to leave their place of residence. There is a normal inability to leave the home and doing so requires considerable and taxing effort. Other absences are for medical reasons / religious services and are infrequent or of short duration when for other reasons). o If current dressing causes regression in wound condition, may D/C ordered dressing product/s and apply Normal Saline Moist Dressing daily until next [place] / Other MD appointment. [place] of regression in wound condition at [phone]. o Please direct any NON-WOUND related issues/requests for orders to patient's Primary Care Physician Wound #3 [place] Nurse may visit PRN to address patientos wound care needs. o FACE TO FACE ENCOUNTER: MEDICARE and MEDICAID PATIENTS: I certify that this patient is under my care and that I had a face-to-face encounter that meets the  physician face-to-face encounter requirements with this patient on this date. The encounter with the patient was in whole or in part for the following MEDICAL CONDITION: (primary reason for [place]) MEDICAL NECESSITY: I certify, that based on my findings, NURSING services are a medically necessary home health service. HOME BOUND STATUS: I certify that my clinical findings support that this patient is homebound (i.e., Due to illness or injury, pt requires aid of supportive devices such as crutches, cane, wheelchairs, walkers, the use of special transportation or the assistance of another person to leave their place of residence. There is a normal inability to leave the home and doing so requires considerable  and taxing effort. Other absences are for medical reasons / religious services and are infrequent or of short duration when for other reasons). o If current dressing causes regression in wound condition, may D/C ordered dressing product/s and apply Normal Saline Moist Dressing daily until next [place] / Other MD appointment. [place] of regression in wound condition at [phone]. o Please direct any NON-WOUND related issues/requests for orders to patient's Primary Care Physician KATHRINE, RIEVES ([id number]) Patient Medications Allergies: No Known Allergies Notifications Medication Indication Start End lidocaine DOSE 1 - topical 4 % cream - 1 cream topical Santyl 12/19/2017 DOSE topical 250 unit/gram ointment - ointment topical; one application daily to left posterior leg wound Electronic Signature(s) Signed: 12/18/2017 4:17:31 PM By: Lawanda Cousins Entered By: Lawanda Cousins on 12/18/2017 16:17:30 Sahm, Imunique ([id number]) -------------------------------------------------------------------------------- Prescription 12/18/2017 Patient Name: Katherine Owen Provider: Lawanda Cousins NP Date of Birth: [birth year] NPI#: [id number] Sex: F DEA#:  [id number] Phone #: [phone] License #: Patient Address: [place], [place] [address] [address] [place], [place] [address] [phone] Allergies No Known Allergies Medication Medication: Route: Strength: Form: lidocaine 4 % topical cream topical 4% cream Class: TOPICAL LOCAL ANESTHETICS Dose: Frequency / Time: Indication: 1 1 cream topical Number of Refills: Number of Units: 0 Generic Substitution: Start Date: End Date: One Time Use: Substitution Permitted No Note to Pharmacy: Signature(s): Date(s): Electronic Signature(s) Signed: 12/18/2017 9:32:27 PM By: Lawanda Cousins Entered By: Lawanda Cousins on 12/18/2017 16:17:32 Geisen, Ninah ([id number]) --------------------------------------------------------------------------------  Problem List Details Patient Name: Katherine Owen Date of Service: 12/18/2017 2:30 PM Medical Record Number: [id number] Patient Account Number: [id number] Date of Birth/Sex: 11-26-1927 (84 y.o. F) Treating RN: Ahmed Prima Primary Care Provider: Roma Schanz Other Clinician: Referring Provider: Marcelle Overlie Treating Provider/Extender: Cathie Olden in Treatment: 0 Active Problems ICD-10 Evaluated Encounter Code Description Active Date Today Diagnosis I73.9 Peripheral vascular disease, unspecified 12/18/2017 No Yes L89.95 Pressure ulcer of unspecified site, unstageable 12/18/2017 No Yes R60.9 Edema, unspecified 12/18/2017 No Yes Z99.3 Dependence on wheelchair 12/18/2017 No Yes Inactive Problems Resolved Problems Electronic Signature(s) Signed: 12/18/2017 8:15:43 PM By: Lawanda Cousins Previous Signature: 12/18/2017 8:05:30 PM Version By: Lawanda Cousins Entered By: Lawanda Cousins on 12/18/2017 20:15:43 Forgette, Torrence  ([id number]) -------------------------------------------------------------------------------- Progress Note Details Patient Name: Katherine Owen Date of Service: 12/18/2017 2:30 PM Medical Record Number: [id number] Patient Account Number: [id number] Date of Birth/Sex: 1927/12/23 (84 y.o. F) Treating RN: Ahmed Prima Primary Care Provider: Carollee Herter, Kendrick Fries Other Clinician: Referring Provider: Marcelle Overlie Treating Provider/Extender: Cathie Olden in Treatment: 0 Subjective Chief Complaint Information obtained from Patient left lower extremity ulcer History of Present Illness (HPI) 12/18/17-She is here for initial evaluation of a left posterior calf pressure ulcer, she suspects from the foot rest of her wheelchair. She is wheelchair/bed bound. She says it has been present for approximately 1 month, beginning as a blister. They have been using silvadene topically. She did have an arterial study today but the results are not available for review. She denies pain at rest, admits to pain with pressure and manipulation. She has been wearing Unna boots and tolerating but there are areas of erythema, concerning for pressure to the bilateral anterior ankle and dorsal aspect of the right foot; we will hold off on ace wrap and use ace wraps at this time. She lives with family and has home health. She is non-diabetic and does not smoke. Wound  History Patient presents with 2 open wounds that have been present for approximately 2 or 3 months. Patient has been treating wounds in the following manner: unna boots. Laboratory tests have not been performed in the last month. Patient reportedly has not tested positive for an antibiotic resistant organism. Patient reportedly has not tested positive for osteomyelitis. Patient reportedly has had testing performed to evaluate circulation in the legs. Patient History Information obtained from Patient, Caregiver. Allergies No Known  Allergies Family History Cancer - Mother,Maternal Grandparents,Siblings, Stroke - Siblings, No family history of Diabetes, Heart Disease, Hereditary Spherocytosis, Hypertension, Kidney Disease, Lung Disease, Seizures, Thyroid Problems, Tuberculosis. Social History Former smoker - 53 years ago, Marital Status - Widowed, Alcohol Use - Never, Drug Use - No History, Caffeine Use - Moderate. Medical History Eyes Patient has history of Cataracts Denies history of Glaucoma, Optic Neuritis Ear/Nose/Mouth/Throat Denies history of Chronic sinus problems/congestion, Middle ear problems Hematologic/Lymphatic Denies history of Anemia, Hemophilia, Human Immunodeficiency Virus, Lymphedema, Sickle Cell Disease Respiratory Denies history of Aspiration, Asthma, Chronic Obstructive Pulmonary Disease (COPD), Pneumothorax, Sleep Apnea, [place]umb[place], Xanthe ([id number]) Tuberculosis Cardiovascular Patient has history of Hypertension Denies history of Angina, Arrhythmia, Congestive Heart Failure, Coronary Artery Disease, Deep Vein Thrombosis, Hypotension, Myocardial Infarction, Peripheral Arterial Disease, Peripheral Venous Disease, Phlebitis, Vasculitis Gastrointestinal Denies history of Cirrhosis , Colitis, Crohn s, Hepatitis A, Hepatitis B, Hepatitis C Endocrine Denies history of Type I Diabetes, Type II Diabetes Genitourinary Denies history of End Stage Renal Disease Immunological Denies history of Lupus Erythematosus, Raynaud s, Scleroderma Integumentary (Skin) Denies history of History of Burn, History of pressure wounds Musculoskeletal Patient has history of Osteoarthritis Denies history of Gout, Rheumatoid Arthritis, Osteomyelitis Neurologic Patient has history of Neuropathy Denies history of Dementia, Quadriplegia, Paraplegia, Seizure Disorder Oncologic Denies history of Received Chemotherapy, Received Radiation Psychiatric Denies history of Anorexia/bulimia, Confinement Anxiety Medical And  Surgical History Notes Musculoskeletal nueropathic pain, non amublatory, Review of Systems (ROS) Constitutional Symptoms (General Health) The patient has no complaints or symptoms. Eyes Complains or has symptoms of Glasses / Contacts - glasses. Denies complaints or symptoms of Dry Eyes, Vision Changes. Ear/Nose/Mouth/Throat Denies complaints or symptoms of Difficult clearing ears, Sinusitis. Hematologic/Lymphatic Denies complaints or symptoms of Bleeding / Clotting Disorders, Human Immunodeficiency Virus. Respiratory Denies complaints or symptoms of Chronic or frequent coughs, Shortness of Breath. Cardiovascular Complains or has symptoms of LE edema. Denies complaints or symptoms of Chest pain. Gastrointestinal Denies complaints or symptoms of Frequent diarrhea, Nausea, Vomiting. Endocrine Denies complaints or symptoms of Hepatitis, Thyroid disease, Polydypsia (Excessive Thirst). Genitourinary Complains or has symptoms of Incontinence/dribbling. Denies complaints or symptoms of Kidney failure/ Dialysis. Immunological Denies complaints or symptoms of Hives, Itching. Integumentary (Skin) Complains or has symptoms of Wounds, Swelling. Denies complaints or symptoms of Bleeding or bruising tendency, Breakdown. Musculoskeletal Denies complaints or symptoms of Muscle Pain, Muscle Weakness. Neurologic Wehrman, Leilyn ([id number]) Denies complaints or symptoms of Numbness/parasthesias, Focal/Weakness. Psychiatric Denies complaints or symptoms of Anxiety, Claustrophobia. Objective Constitutional Vitals Time Taken: 3:15 PM, Temperature: 98.8 F, Pulse: 82 bpm, Respiratory Rate: 16 breaths/min, Blood Pressure: 103/57 mmHg. Respiratory respirations are even and unlabored. Cardiovascular LLE- non-palpable pedal pulse. LLE- trace edema, warm to touch, adequate cap refill. Musculoskeletal wheelchair/bed bound; hoyer lift. Psychiatric oriented x4. calm, cooperative. Integumentary  (Hair, Skin) Wound #1 status is Open. Original cause of wound was Gradually Appeared. The wound is located on the Left,Lateral Lower Leg. The wound measures 2.9cm length x 1.7cm width x 0.1cm depth; 3.872cm^2 area and 0.387cm^3 volume. There is Fat  Layer (Subcutaneous Tissue) Exposed exposed. There is no tunneling or undermining noted. There is a medium amount of serous drainage noted. The wound margin is flat and intact. There is no granulation within the wound bed. There is a large (67-100%) amount of necrotic tissue within the wound bed including Eschar and Adherent Slough. The periwound skin appearance did not exhibit: Callus, Crepitus, Excoriation, Induration, Rash, Scarring, Dry/Scaly, Maceration, Atrophie Blanche, Cyanosis, Ecchymosis, Hemosiderin Staining, Mottled, Pallor, Rubor, Erythema. Periwound temperature was noted as No Abnormality. The periwound has tenderness on palpation. Wound #2 status is Open. Original cause of wound was Gradually Appeared. The wound is located on the Left Toe Second. The wound measures 0.1cm length x 0.1cm width x 0.1cm depth; 0.008cm^2 area and 0.001cm^3 volume. There is Fat Layer (Subcutaneous Tissue) Exposed exposed. There is no tunneling or undermining noted. There is a small amount of serous drainage noted. The wound margin is flat and intact. There is medium (34-66%) pink granulation within the wound bed. There is a medium (34-66%) amount of necrotic tissue within the wound bed including Adherent Slough. The periwound skin appearance did not exhibit: Callus, Crepitus, Excoriation, Induration, Rash, Scarring, Dry/Scaly, Maceration, Atrophie Blanche, Cyanosis, Ecchymosis, Hemosiderin Staining, Mottled, Pallor, Rubor, Erythema. Periwound temperature was noted as No Abnormality. The periwound has tenderness on palpation. Wound #3 status is Open. Original cause of wound was Pressure Injury. The wound is located on the Right,Posterior Lower Leg. The wound  measures 0.5cm length x 0.8cm width x 0.1cm depth; 0.314cm^2 area and 0.031cm^3 volume. There is no tunneling or undermining noted. There is a none present amount of drainage noted. The wound margin is flat and intact. There is no granulation within the wound bed. There is a large (67-100%) amount of necrotic tissue within the wound bed including Eschar. Periwound temperature was noted as No Abnormality. The periwound has tenderness on palpation. Brickey, Redina ([id number]) Assessment Active Problems ICD-10 Peripheral vascular disease, unspecified Pressure ulcer of unspecified site, unstageable Edema, unspecified Dependence on wheelchair Procedures Wound #1 Pre-procedure diagnosis of Wound #1 is a Venous Leg Ulcer located on the Left,Lateral Lower Leg .Severity of Tissue Pre Debridement is: Fat layer exposed. There was a Selective/Open Wound Non-Viable Tissue Debridement with a total area of 4.93 sq cm performed by Lawanda Cousins, NP. With the following instrument(s): Blade, and Forceps Material removed includes Eschar and Slough and after achieving pain control using Lidocaine 4% Topical Solution. No specimens were taken. A time out was conducted at 15:41, prior to the start of the procedure. A Minimum amount of bleeding was controlled with Pressure. The procedure was tolerated well with a pain level of 0 throughout and a pain level of 0 following the procedure. Patient s Level of Consciousness post procedure was recorded as Awake and Alert. Post Debridement Measurements: 2.9cm length x 1.7cm width x 0.2cm depth; 0.774cm^3 volume. Character of Wound/Ulcer Post Debridement requires further debridement. Severity of Tissue Post Debridement is: Fat layer exposed. Post procedure Diagnosis Wound #1: Same as Pre-Procedure Plan Wound Cleansing: Wound #1 Left,Lateral Lower Leg: Clean wound with Normal Saline. Cleanse wound with mild soap and water Wound #2 Left Toe Second: Clean wound with  Normal Saline. Cleanse wound with mild soap and water Wound #3 Right,Posterior Lower Leg: Clean wound with Normal Saline. Cleanse wound with mild soap and water Anesthetic (add to Medication List): Wound #1 Left,Lateral Lower Leg: Topical Lidocaine 4% cream applied to wound bed prior to debridement (In Clinic Only). Wound #2 Left Toe Second: Topical Lidocaine  4% cream applied to wound bed prior to debridement (In Clinic Only). Wound #3 Right,Posterior Lower Leg: Topical Lidocaine 4% cream applied to wound bed prior to debridement (In Clinic Only). Primary Wound Dressing: Wound #1 Left,Lateral Lower Leg: Trela, Elaysia ([id number]) Saline moistened gauze Santyl Ointment Wound #2 Left Toe Second: Hydrafera Blue Ready Transfer Wound #3 Right,Posterior Lower Leg: Hydrafera Blue Ready Transfer Secondary Dressing: Wound #1 Left,Lateral Lower Leg: Dry Gauze [place] Wound #2 Left Toe Second: Dry Gauze [place] Other - band-aide Wound #3 Right,Posterior Lower Leg: [place] Dressing Change Frequency: Wound #1 Left,Lateral Lower Leg: Change dressing every day. Wound #2 Left Toe Second: Change dressing every other day. Wound #3 Right,Posterior Lower Leg: Change dressing every other day. Follow-up Appointments: Wound #1 Left,Lateral Lower Leg: Return Appointment in 2 weeks. Wound #2 Left Toe Second: Return Appointment in 2 weeks. Wound #3 Right,Posterior Lower Leg: Return Appointment in 2 weeks. Edema Control: Wound #1 Left,Lateral Lower Leg: Elevate legs to the level of the heart and pump ankles as often as possible Other: - ace wrap for edema Wound #2 Left Toe Second: Elevate legs to the level of the heart and pump ankles as often as possible Other: - ace wrap for edema Wound #3 Right,Posterior Lower Leg: Elevate legs to the level of the heart and pump ankles as often as possible Other: - ace wrap for edema Off-Loading: Wound #1 Left,Lateral Lower Leg: Turn  and reposition every 2 hours Wound #2 Left Toe Second: Turn and reposition every 2 hours Wound #3 Right,Posterior Lower Leg: Turn and reposition every 2 hours Additional Orders / Instructions: Wound #1 Left,Lateral Lower Leg: Increase protein intake. Wound #2 Left Toe Second: Increase protein intake. Wound #3 Right,Posterior Lower Leg: Increase protein intake. Home Health: Wound #1 Left,Lateral Lower Leg: [place] Nurse may visit PRN to address patient s wound care needs. FACE TO FACE ENCOUNTER: MEDICARE and MEDICAID PATIENTS: I certify that this patient is under my care and that I had a face-to-face encounter that meets the physician face-to-face encounter requirements with this patient on this date. The TINNIE, KUNIN ([id number]) encounter with the patient was in whole or in part for the following MEDICAL CONDITION: (primary reason for [place]) MEDICAL NECESSITY: I certify, that based on my findings, NURSING services are a medically necessary home health service. HOME BOUND STATUS: I certify that my clinical findings support that this patient is homebound (i.e., Due to illness or injury, pt requires aid of supportive devices such as crutches, cane, wheelchairs, walkers, the use of special transportation or the assistance of another person to leave their place of residence. There is a normal inability to leave the home and doing so requires considerable and taxing effort. Other absences are for medical reasons / religious services and are infrequent or of short duration when for other reasons). If current dressing causes regression in wound condition, may D/C ordered dressing product/s and apply Normal Saline Moist Dressing daily until next [place] / Other MD appointment. [place] of regression in wound condition at [phone]. Please direct any NON-WOUND related issues/requests for orders to patient's Primary  Care Physician Wound #2 Left Toe Second: [place] Nurse may visit PRN to address patient s wound care needs. FACE TO FACE ENCOUNTER: MEDICARE and MEDICAID PATIENTS: I certify that this patient is under my care and that I had a face-to-face encounter that meets the physician face-to-face encounter requirements with  this patient on this date. The encounter with the patient was in whole or in part for the following MEDICAL CONDITION: (primary reason for [place]) MEDICAL NECESSITY: I certify, that based on my findings, NURSING services are a medically necessary home health service. HOME BOUND STATUS: I certify that my clinical findings support that this patient is homebound (i.e., Due to illness or injury, pt requires aid of supportive devices such as crutches, cane, wheelchairs, walkers, the use of special transportation or the assistance of another person to leave their place of residence. There is a normal inability to leave the home and doing so requires considerable and taxing effort. Other absences are for medical reasons / religious services and are infrequent or of short duration when for other reasons). If current dressing causes regression in wound condition, may D/C ordered dressing product/s and apply Normal Saline Moist Dressing daily until next [place] / Other MD appointment. [place] of regression in wound condition at [phone]. Please direct any NON-WOUND related issues/requests for orders to patient's Primary Care Physician Wound #3 Right,Posterior Lower Leg: [place] Nurse may visit PRN to address patient s wound care needs. FACE TO FACE ENCOUNTER: MEDICARE and MEDICAID PATIENTS: I certify that this patient is under my care and that I had a face-to-face encounter that meets the physician face-to-face encounter requirements with this patient on this date. The encounter with the  patient was in whole or in part for the following MEDICAL CONDITION: (primary reason for [place]) MEDICAL NECESSITY: I certify, that based on my findings, NURSING services are a medically necessary home health service. HOME BOUND STATUS: I certify that my clinical findings support that this patient is homebound (i.e., Due to illness or injury, pt requires aid of supportive devices such as crutches, cane, wheelchairs, walkers, the use of special transportation or the assistance of another person to leave their place of residence. There is a normal inability to leave the home and doing so requires considerable and taxing effort. Other absences are for medical reasons / religious services and are infrequent or of short duration when for other reasons). If current dressing causes regression in wound condition, may D/C ordered dressing product/s and apply Normal Saline Moist Dressing daily until next [place] / Other MD appointment. [place] of regression in wound condition at [phone]. Please direct any NON-WOUND related issues/requests for orders to patient's Primary Care Physician The following medication(s) was prescribed: lidocaine topical 4 % cream 1 1 cream topical was prescribed at facility Santyl topical 250 unit/gram ointment ointment topical; one application daily to left posterior leg wound starting 12/19/2017 Electronic Signature(s) Signed: 12/18/2017 8:33:27 PM By: Lawanda Cousins Entered By: Lawanda Cousins on 12/18/2017 20:33:27 Prickett, [name] ([id number]) -------------------------------------------------------------------------------- ROS/PFSH Details Patient Name: Katherine Owen Date of Service: 12/18/2017 2:30 PM Medical Record Number: [id number] Patient Account Number: [id number] Date of Birth/Sex: 01-23-1928 (84 y.o. F) Treating RN: Montey Hora Primary Care Provider: Carollee Herter, Kendrick Fries Other Clinician: Referring Provider: Marcelle Overlie Treating Provider/Extender: Cathie Olden in Treatment: 0 Information Obtained From Patient Caregiver Wound History Do you currently have one or more open woundso Yes How many open wounds do you currently haveo 2 Approximately how long have you had your woundso 2 or 3 months How have you been treating your wound(s) until nowo unna boots Has your wound(s) ever healed and then re-openedo No Have you had any lab work done in the past montho No  Have you tested positive for an antibiotic resistant organism (MRSA, VRE)o No Have you tested positive for osteomyelitis (bone infection)o No Have you had any tests for circulation on your legso Yes Where was the test doneo [facility] Constitutional Symptoms (General Health) Complaints and Symptoms: No Complaints or Symptoms Complaints and Symptoms: Negative for: Fatigue; Fever; Chills; Marked Weight Change Eyes Complaints and Symptoms: Positive for: Glasses / Contacts - glasses Negative for: Dry Eyes; Vision Changes Medical History: Positive for: Cataracts Negative for: Glaucoma; Optic Neuritis Ear/Nose/Mouth/Throat Complaints and Symptoms: Negative for: Difficult clearing ears; Sinusitis Medical History: Negative for: Chronic sinus problems/congestion; Middle ear problems Hematologic/Lymphatic Complaints and Symptoms: Negative for: Bleeding / Clotting Disorders; Human Immunodeficiency Virus Medical History: Negative for: Anemia; Hemophilia; Human Immunodeficiency Virus; Lymphedema; Sickle Cell Disease Respiratory Goh, Chelsa ([id number]) Complaints and Symptoms: Negative for: Chronic or frequent coughs; Shortness of Breath Medical History: Negative for: Aspiration; Asthma; Chronic Obstructive Pulmonary Disease (COPD); Pneumothorax; Sleep Apnea; Tuberculosis Cardiovascular Complaints and Symptoms: Positive for: LE edema Negative for: Chest pain Medical History: Positive for: Hypertension Negative for: Angina;  Arrhythmia; Congestive Heart Failure; Coronary Artery Disease; Deep Vein Thrombosis; Hypotension; Myocardial Infarction; Peripheral Arterial Disease; Peripheral Venous Disease; Phlebitis; Vasculitis Gastrointestinal Complaints and Symptoms: Negative for: Frequent diarrhea; Nausea; Vomiting Medical History: Negative for: Cirrhosis ; Colitis; Crohnos; Hepatitis A; Hepatitis B; Hepatitis C Endocrine Complaints and Symptoms: Negative for: Hepatitis; Thyroid disease; Polydypsia (Excessive Thirst) Medical History: Negative for: Type I Diabetes; Type II Diabetes Genitourinary Complaints and Symptoms: Positive for: Incontinence/dribbling Negative for: Kidney failure/ Dialysis Medical History: Negative for: End Stage Renal Disease Immunological Complaints and Symptoms: Negative for: Hives; Itching Medical History: Negative for: Lupus Erythematosus; Raynaudos; Scleroderma Integumentary (Skin) Complaints and Symptoms: Positive for: Wounds; Swelling Negative for: Bleeding or bruising tendency; Breakdown Medical History: Negative for: History of Burn; History of pressure wounds Parkey, Nasha ([id number]) Musculoskeletal Complaints and Symptoms: Negative for: Muscle Pain; Muscle Weakness Medical History: Positive for: Osteoarthritis Negative for: Gout; Rheumatoid Arthritis; Osteomyelitis Past Medical History Notes: nueropathic pain, non amublatory, Neurologic Complaints and Symptoms: Negative for: Numbness/parasthesias; Focal/Weakness Medical History: Positive for: Neuropathy Negative for: Dementia; Quadriplegia; Paraplegia; Seizure Disorder Psychiatric Complaints and Symptoms: Negative for: Anxiety; Claustrophobia Medical History: Negative for: Anorexia/bulimia; Confinement Anxiety Oncologic Medical History: Negative for: Received Chemotherapy; Received Radiation HBO Extended History Items Eyes: Cataracts Immunizations Pneumococcal Vaccine: Received Pneumococcal  Vaccination: Yes Immunization Notes: up to date Implantable Devices Family and Social History Cancer: Yes - Mother,Maternal Grandparents,Siblings; Diabetes: No; Heart Disease: No; Hereditary Spherocytosis: No; Hypertension: No; Kidney Disease: No; Lung Disease: No; Seizures: No; Stroke: Yes - Siblings; Thyroid Problems: No; Tuberculosis: No; Former smoker - 38 years ago; Marital Status - Widowed; Alcohol Use: Never; Drug Use: No History; Caffeine Use: Moderate; Financial Concerns: No; Food, Clothing or Shelter Needs: No; Support System Lacking: No; Transportation Concerns: No; Advanced Directives: No; Patient does not want information on Advanced Directives Electronic Signature(s) Signed: 12/18/2017 4:38:21 PM By: Montey Hora Signed: 12/18/2017 9:32:27 PM By: Lawanda Cousins Entered By: Montey Hora on 12/18/2017 15:10:18 Bitterman, Uldine ([id number]) Gage, Darline ([id number]) -------------------------------------------------------------------------------- SuperBill Details Patient Name: Katherine Owen Date of Service: 12/18/2017 Medical Record Number: [id number] Patient Account Number: [id number] Date of Birth/Sex: August 08, 1927 (84 y.o. F) Treating RN: Ahmed Prima Primary Care Provider: Roma Schanz Other Clinician: Referring Provider: Marcelle Overlie Treating Provider/Extender: Cathie Olden in Treatment: 0 Diagnosis Coding ICD-10 Codes Code Description I73.9 Peripheral vascular disease, unspecified L89.95 Pressure ulcer of unspecified site, unstageable R60.9 Edema, unspecified Z99.3 Dependence on wheelchair Facility  Procedures CPT4 Code: [id number] Description: 93552 - WOUND CARE VISIT-LEV 3 EST PT Modifier: Quantity: 1 CPT4 Code: [id number] Description: 39672 - [place] WOUND 1ST 20 SQ CM OR < ICD-10 Diagnosis Description L89.95 Pressure ulcer of unspecified site, unstageable Modifier: Quantity: 1 Physician Procedures CPT4 Code: 8979150 Description: WC  PHYS LEVEL 3 o NEW PT ICD-10 Diagnosis Description L89.95 Pressure ulcer of unspecified site, unstageable I73.9 Peripheral vascular disease, unspecified R60.9 Edema, unspecified Z99.3 Dependence on wheelchair Modifier: Quantity: 1 CPT4 Code: [id number] Description: 37793 - WC PHYS DEBR WO ANESTH 20 SQ CM ICD-10 Diagnosis Description L89.95 Pressure ulcer of unspecified site, unstageable Modifier: Quantity: 1 Electronic Signature(s) Signed: 12/18/2017 8:33:50 PM By: Lawanda Cousins Entered By: Lawanda Cousins on 12/18/2017 20:33:50

## 2017-12-23 NOTE — Progress Notes (Signed)
Katherine Owen ([id number]) Visit Report for 12/18/2017 Allergy List Details Patient Name: Katherine Owen, Katherine Owen Date of Service: 12/18/2017 2:30 PM Medical Record Number: [id number] Patient Account Number: [id number] Date of Birth/Sex: 05/05/1928 (84 y.o. F) Treating RN: Montey Hora Primary Care Antara Brecheisen: Roma Schanz Other Clinician: Referring Tyger Wichman: Marcelle Overlie Treating Takeela Peil/Extender: Lawanda Cousins Weeks in Treatment: 0 Allergies Active Allergies No Known Allergies Allergy Notes Electronic Signature(s) Signed: 12/18/2017 4:38:21 PM By: Montey Hora Entered By: Montey Hora on 12/18/2017 14:57:29 Midkiff, Zina ([id number]) -------------------------------------------------------------------------------- Arrival Information Details Patient Name: Katherine Owen Date of Service: 12/18/2017 2:30 PM Medical Record Number: [id number] Patient Account Number: [id number] Date of Birth/Sex: 02-20-1928 (84 y.o. F) Treating RN: Montey Hora Primary Care Khrista Braun: Carollee Herter, Kendrick Fries Other Clinician: Referring Kaede Clendenen: Marcelle Overlie Treating Yarden Hillis/Extender: Cathie Olden in Treatment: 0 Visit Information Patient Arrived: Wheel Chair Arrival Time: 14:54 Accompanied By: son Transfer Assistance: Hoyer Lift Patient Identification Verified: Yes Secondary Verification Process Completed: Yes Electronic Signature(s) Signed: 12/18/2017 4:38:21 PM By: Montey Hora Entered By: Montey Hora on 12/18/2017 15:10:37 Whitwell, [name] ([id number]) -------------------------------------------------------------------------------- Clinic Level of Care Assessment Details Patient Name: Katherine Owen Date of Service: 12/18/2017 2:30 PM Medical Record Number: [id number] Patient Account Number: [id number] Date of Birth/Sex: [birth year] (84 y.o. F) Treating RN: Ahmed Prima Primary Care Troyce Gieske: Carollee Herter, Kendrick Fries Other Clinician: Referring Marshel Golubski: Marcelle Overlie Treating Wiletta Bermingham/Extender: Cathie Olden in Treatment: 0 Clinic Level of Care Assessment Items TOOL 1 Quantity Score X - Use when EandM and Procedure is performed on INITIAL visit 1 0 ASSESSMENTS - Nursing Assessment / Reassessment X - General Physical Exam (combine w/ comprehensive assessment (listed just below) when 1 20 performed on new pt. evals) X- 1 25 Comprehensive Assessment (HX, ROS, Risk Assessments, Wounds Hx, etc.) ASSESSMENTS - Wound and Skin Assessment / Reassessment []  - Dermatologic / Skin Assessment (not related to wound area) 0 ASSESSMENTS - Ostomy and/or Continence Assessment and Care []  - Incontinence Assessment and Management 0 []  - 0 Ostomy Care Assessment and Management (repouching, etc.) PROCESS - Coordination of Care []  - Simple Patient / Family Education for ongoing care 0 X- 1 20 Complex (extensive) Patient / Family Education for ongoing care X- 1 10 Staff obtains Programmer, systems, Records, Test Results / Process Orders X- 1 10 Staff telephones HHA, Nursing Homes / Clarify orders / etc []  - 0 Routine Transfer to another Facility (non-emergent condition) []  - 0 Routine Hospital Admission (non-emergent condition) X- 1 15 New Admissions / Biomedical engineer / Ordering NPWT, Apligraf, etc. []  - 0 Emergency Hospital Admission (emergent condition) PROCESS - Special Needs []  - Pediatric / Minor Patient Management 0 []  - 0 Isolation Patient Management []  - 0 Hearing / Language / Visual special needs []  - 0 Assessment of Community assistance (transportation, D/C planning, etc.) []  - 0 Additional assistance / Altered mentation []  - 0 Support Surface(s) Assessment (bed, cushion, seat, etc.) Guimond, Angelika ([id number]) INTERVENTIONS - Miscellaneous []  - External ear exam 0 X- 1 10 Patient Transfer (multiple staff / Civil Service fast streamer / Similar devices) []  - 0 Simple Staple / Suture removal (25 or less) []  - 0 Complex Staple / Suture removal  (26 or more) []  - 0 Hypo/Hyperglycemic Management (do not check if billed separately) []  - 0 Ankle / Brachial Index (ABI) - do not check if billed separately Has the patient been seen at the hospital within the last three years: Yes Total Score: 110 Level Of Care: New/Established - Level 3 Electronic Signature(s) Signed: 12/22/2017 5:11:46 PM  By: Alric Quan Entered By: Alric Quan on 12/18/2017 16:31:17 Beamon, [name] ([id number]) -------------------------------------------------------------------------------- Lower Extremity Assessment Details Patient Name: Katherine Owen Date of Service: 12/18/2017 2:30 PM Medical Record Number: [id number] Patient Account Number: [id number] Date of Birth/Sex: 1927/09/21 (84 y.o. F) Treating RN: Montey Hora Primary Care Audrinna Sherman: Carollee Herter, Kendrick Fries Other Clinician: Referring Priscella Donna: Marcelle Overlie Treating Cornell Bourbon/Extender: Cathie Olden in Treatment: 0 Edema Assessment Assessed: [Left: No] [Right: No] Edema: [Left: No] [Right: No] Calf Left: Right: Point of Measurement: 36 cm From Medial Instep 36.8 cm 35.4 cm Ankle Left: Right: Point of Measurement: 12 cm From Medial Instep 19.8 cm 18.3 cm Vascular Assessment Pulses: Dorsalis Pedis Palpable: [Left:Yes] [Right:Yes] Posterior Tibial Palpable: [Left:Yes] [Right:Yes] Extremity colors, hair growth, and conditions: Extremity Color: [Left:Normal] [Right:Normal] Hair Growth on Extremity: [Left:No] [Right:No] Temperature of Extremity: [Left:Cool] [Right:Cool] Capillary Refill: [Left:< 3 seconds] [Right:< 3 seconds] Toe Nail Assessment Left: Right: Thick: Yes Yes Discolored: No No Deformed: No No Improper Length and Hygiene: No No Electronic Signature(s) Signed: 12/18/2017 4:38:21 PM By: Montey Hora Entered By: Montey Hora on 12/18/2017 15:27:35 Siverling, Constanza  ([id number]) -------------------------------------------------------------------------------- Multi Wound Chart Details Patient Name: Katherine Owen Date of Service: 12/18/2017 2:30 PM Medical Record Number: [id number] Patient Account Number: [id number] Date of Birth/Sex: May 29, 1928 (84 y.o. F) Treating RN: Ahmed Prima Primary Care Rionna Feltes: Carollee Herter, Kendrick Fries Other Clinician: Referring Drago Hammonds: Marcelle Overlie Treating Bisma Klett/Extender: Cathie Olden in Treatment: 0 Vital Signs Height(in): Pulse(bpm): 22 Weight(lbs): Blood Pressure(mmHg): 103/57 Body Mass Index(BMI): Temperature(F): 98.8 Respiratory Rate 16 (breaths/min): Photos: [1:No Photos] [2:No Photos] [3:No Photos] Wound Location: [1:Left Lower Leg - Lateral] [2:Left Toe Second] [3:Left Toe Fourth] Wounding Event: [1:Gradually Appeared] [2:Gradually Appeared] [3:Gradually Appeared] Primary Etiology: [1:Venous Leg Ulcer] [2:To be determined] [3:To be determined] Comorbid History: [1:Cataracts, Hypertension, Osteoarthritis, Neuropathy] [2:Cataracts, Hypertension, Osteoarthritis, Neuropathy] [3:Cataracts, Hypertension, Osteoarthritis, Neuropathy] Date Acquired: [1:11/17/2017] [2:09/15/2017] [3:09/15/2017] Weeks of Treatment: [1:0] [2:0] [3:0] Wound Status: [1:Open] [2:Open] [3:Open] Measurements L x W x D [1:2.9x1.7x0.1] [2:0.1x0.1x0.1] [3:0.1x0.1x0.1] (cm) Area (cm) : [1:3.872] [2:0.008] [3:0.008] Volume (cm) : [1:0.387] [2:0.001] [3:0.001] Classification: [1:Full Thickness Without Exposed Support Structures] [2:Full Thickness Without Exposed Support Structures] [3:Full Thickness Without Exposed Support Structures] Exudate Amount: [1:Medium] [2:Small] [3:Small] Exudate Type: [1:Serous] [2:Serous] [3:Serous] Exudate Color: [1:amber] [2:amber] [3:amber] Wound Margin: [1:Flat and Intact] [2:Flat and Intact] [3:Flat and Intact] Granulation Amount: [1:None Present (0%)] [2:Medium (34-66%)] [3:Medium  (34-66%)] Granulation Quality: [1:N/A] [2:Pink] [3:Pink] Necrotic Amount: [1:Large (67-100%)] [2:Medium (34-66%)] [3:Medium (34-66%)] Necrotic Tissue: [1:Eschar, Adherent Slough] [2:Adherent Slough] [3:Adherent Slough] Exposed Structures: [1:Fat Layer (Subcutaneous Tissue) Exposed: Yes Fascia: No Tendon: No Muscle: No Joint: No Bone: No] [2:Fat Layer (Subcutaneous Tissue) Exposed: Yes Fascia: No Tendon: No Muscle: No Joint: No Bone: No] [3:Fat Layer (Subcutaneous Tissue) Exposed: Yes  Fascia: No Tendon: No Muscle: No Joint: No Bone: No] Epithelialization: [1:None] [2:None] [3:None] Periwound Skin Texture: [1:Excoriation: No Induration: No Callus: No Crepitus: No Rash: No Scarring: No] [2:Excoriation: No Induration: No Callus: No Crepitus: No Rash: No Scarring: No] [3:Excoriation: No Induration: No Callus: No Crepitus: No Rash: No Scarring: No] Periwound Skin Moisture: Maceration: No Maceration: No Maceration: No Dry/Scaly: No Dry/Scaly: No Dry/Scaly: No Periwound Skin Color: Atrophie Blanche: No Atrophie Blanche: No Atrophie Blanche: No Cyanosis: No Cyanosis: No Cyanosis: No Ecchymosis: No Ecchymosis: No Ecchymosis: No Erythema: No Erythema: No Erythema: No Hemosiderin Staining: No Hemosiderin Staining: No Hemosiderin Staining: No Mottled: No Mottled: No Mottled: No Pallor: No Pallor: No Pallor: No Rubor: No Rubor: No Rubor: No Temperature: No Abnormality  No Abnormality No Abnormality Tenderness on Palpation: Yes Yes No Wound Preparation: Ulcer Cleansing: Ulcer Cleansing: Ulcer Cleansing: Rinsed/Irrigated with Saline Rinsed/Irrigated with Saline Rinsed/Irrigated with Saline Topical Anesthetic Applied: Topical Anesthetic Applied: Topical Anesthetic Applied: Other: lidocaine 4% Other: lidocaine 4% Other: lidocaine 4% Treatment Notes Electronic Signature(s) Signed: 12/22/2017 5:11:46 PM By: Alric Quan Entered By: Alric Quan on 12/18/2017  15:35:20 Gores, Sharaya ([id number]) -------------------------------------------------------------------------------- Multi-Disciplinary Care Plan Details Patient Name: Katherine Owen Date of Service: 12/18/2017 2:30 PM Medical Record Number: [id number] Patient Account Number: [id number] Date of Birth/Sex: February 15, 1928 (84 y.o. F) Treating RN: Ahmed Prima Primary Care Kedric Bumgarner: Carollee Herter, Kendrick Fries Other Clinician: Referring Jerianne Anselmo: Marcelle Overlie Treating Viyan Rosamond/Extender: Cathie Olden in Treatment: 0 Active Inactive ` Abuse / Safety / Falls / Self Care Management Nursing Diagnoses: Potential for falls Goals: Patient will not experience any injury related to falls Date Initiated: 12/18/2017 Target Resolution Date: 03/28/2018 Goal Status: Active Interventions: Assess Activities of Daily Living upon admission and as needed Assess fall risk on admission and as needed Assess: immobility, friction, shearing, incontinence upon admission and as needed Assess impairment of mobility on admission and as needed per policy Assess personal safety and home safety (as indicated) on admission and as needed Notes: ` Nutrition Nursing Diagnoses: Imbalanced nutrition Goals: Patient/caregiver agrees to and verbalizes understanding of need to use nutritional supplements and/or vitamins as prescribed Date Initiated: 12/18/2017 Target Resolution Date: 03/28/2018 Goal Status: Active Interventions: Assess patient nutrition upon admission and as needed per policy Notes: ` Orientation to the Wound Care Program Nursing Diagnoses: Knowledge deficit related to the wound healing center program Goals: Patient/caregiver will verbalize understanding of the [place], [place] ([id number]) Date Initiated: 12/18/2017 Target Resolution Date: 12/27/2017 Goal Status: Active Interventions: Provide education on orientation to the wound center Notes: ` Pain, Acute or  Chronic Nursing Diagnoses: Pain, acute or chronic: actual or potential Potential alteration in comfort, pain Goals: Patient/caregiver will verbalize adequate pain control between visits Date Initiated: 12/18/2017 Target Resolution Date: 03/28/2018 Goal Status: Active Interventions: Complete pain assessment as per visit requirements Notes: ` Wound/Skin Impairment Nursing Diagnoses: Impaired tissue integrity Knowledge deficit related to ulceration/compromised skin integrity Goals: Ulcer/skin breakdown will have a volume reduction of 80% by week 12 Date Initiated: 12/18/2017 Target Resolution Date: 03/28/2018 Goal Status: Active Interventions: Assess patient/caregiver ability to perform ulcer/skin care regimen upon admission and as needed Assess ulceration(s) every visit Notes: Electronic Signature(s) Signed: 12/22/2017 5:11:46 PM By: Alric Quan Entered By: Alric Quan on 12/18/2017 15:34:31 Abadi, Yahayra ([id number]) -------------------------------------------------------------------------------- Pain Assessment Details Patient Name: Katherine Owen Date of Service: 12/18/2017 2:30 PM Medical Record Number: [id number] Patient Account Number: [id number] Date of Birth/Sex: [birth year] (84 y.o. F) Treating RN: Montey Hora Primary Care Sulema Braid: Carollee Herter, Kendrick Fries Other Clinician: Referring Darreld Hoffer: Marcelle Overlie Treating Elton Catalano/Extender: Cathie Olden in Treatment: 0 Active Problems Location of Pain Severity and Description of Pain Patient Has Paino No Site Locations Pain Management and Medication Current Pain Management: Electronic Signature(s) Signed: 12/18/2017 4:38:21 PM By: Montey Hora Entered By: Montey Hora on 12/18/2017 14:57:06 Cicalese, Callan ([id number]) -------------------------------------------------------------------------------- Wound Assessment Details Patient Name: Katherine Owen Date of Service: 12/18/2017 2:30  PM Medical Record Number: [id number] Patient Account Number: [id number] Date of Birth/Sex: [birth year] (84 y.o. F) Treating RN: Montey Hora Primary Care Rai Sinagra: Carollee Herter, Kendrick Fries Other Clinician: Referring Reona Zendejas: Marcelle Overlie Treating Gizel Riedlinger/Extender: Cathie Olden in Treatment: 0 Wound Status Wound Number: 1 Primary Venous Leg Ulcer Etiology: Wound Location: Left Lower Leg - Lateral Wound  Status: Open Wounding Event: Gradually Appeared Comorbid Cataracts, Hypertension, Osteoarthritis, Date Acquired: 11/17/2017 History: Neuropathy Weeks Of Treatment: 0 Clustered Wound: No Photos Photo Uploaded By: Montey Hora on 12/18/2017 15:36:05 Wound Measurements Length: (cm) 2.9 Width: (cm) 1.7 Depth: (cm) 0.1 Area: (cm) 3.872 Volume: (cm) 0.387 % Reduction in Area: % Reduction in Volume: Epithelialization: None Tunneling: No Undermining: No Wound Description Full Thickness Without Exposed Support Foul O Classification: Structures Slough Wound Margin: Flat and Intact Exudate Medium Amount: Exudate Type: Serous Exudate Color: amber dor After Cleansing: No /Fibrino No Wound Bed Granulation Amount: None Present (0%) Exposed Structure Necrotic Amount: Large (67-100%) Fascia Exposed: No Necrotic Quality: Eschar, Adherent Slough Fat Layer (Subcutaneous Tissue) Exposed: Yes Tendon Exposed: No Muscle Exposed: No Joint Exposed: No Bone Exposed: No Laton, Jackelynn ([id number]) Periwound Skin Texture Texture Color No Abnormalities Noted: No No Abnormalities Noted: No Callus: No Atrophie Blanche: No Crepitus: No Cyanosis: No Excoriation: No Ecchymosis: No Induration: No Erythema: No Rash: No Hemosiderin Staining: No Scarring: No Mottled: No Pallor: No Moisture Rubor: No No Abnormalities Noted: No Dry / Scaly: No Temperature / Pain Maceration: No Temperature: No Abnormality Tenderness on Palpation: Yes Wound Preparation Ulcer  Cleansing: Rinsed/Irrigated with Saline Topical Anesthetic Applied: Other: lidocaine 4%, Electronic Signature(s) Signed: 12/18/2017 4:38:21 PM By: Montey Hora Entered By: Montey Hora on 12/18/2017 15:20:45 Daley, Ariauna ([id number]) -------------------------------------------------------------------------------- Wound Assessment Details Patient Name: Katherine Owen Date of Service: 12/18/2017 2:30 PM Medical Record Number: [id number] Patient Account Number: [id number] Date of Birth/Sex: [birth year] (84 y.o. F) Treating RN: Montey Hora Primary Care Tnia Anglada: Carollee Herter, Kendrick Fries Other Clinician: Referring Padraic Marinos: Marcelle Overlie Treating Camisha Srey/Extender: Cathie Olden in Treatment: 0 Wound Status Wound Number: 2 Primary To be determined Etiology: Wound Location: Left Toe Second Wound Status: Open Wounding Event: Gradually Appeared Comorbid Cataracts, Hypertension, Osteoarthritis, Date Acquired: 09/15/2017 History: Neuropathy Weeks Of Treatment: 0 Clustered Wound: No Photos Photo Uploaded By: Montey Hora on 12/18/2017 15:36:32 Wound Measurements Length: (cm) 0.1 Width: (cm) 0.1 Depth: (cm) 0.1 Area: (cm) 0.008 Volume: (cm) 0.001 % Reduction in Area: % Reduction in Volume: Epithelialization: None Tunneling: No Undermining: No Wound Description Full Thickness Without Exposed Support Classification: Structures Wound Margin: Flat and Intact Exudate Small Amount: Exudate Type: Serous Exudate Color: amber Foul Odor After Cleansing: No Slough/Fibrino Yes Wound Bed Granulation Amount: Medium (34-66%) Exposed Structure Granulation Quality: Pink Fascia Exposed: No Necrotic Amount: Medium (34-66%) Fat Layer (Subcutaneous Tissue) Exposed: Yes Necrotic Quality: Adherent Slough Tendon Exposed: No Muscle Exposed: No Joint Exposed: No Bone Exposed: No Divelbiss, Disa ([id number]) Periwound Skin Texture Texture Color No Abnormalities Noted:  No No Abnormalities Noted: No Callus: No Atrophie Blanche: No Crepitus: No Cyanosis: No Excoriation: No Ecchymosis: No Induration: No Erythema: No Rash: No Hemosiderin Staining: No Scarring: No Mottled: No Pallor: No Moisture Rubor: No No Abnormalities Noted: No Dry / Scaly: No Temperature / Pain Maceration: No Temperature: No Abnormality Tenderness on Palpation: Yes Wound Preparation Ulcer Cleansing: Rinsed/Irrigated with Saline Topical Anesthetic Applied: Other: lidocaine 4%, Electronic Signature(s) Signed: 12/18/2017 4:38:21 PM By: Montey Hora Entered By: Montey Hora on 12/18/2017 15:22:19 Goodroe, Tashae ([id number]) -------------------------------------------------------------------------------- Wound Assessment Details Patient Name: Katherine Owen Date of Service: 12/18/2017 2:30 PM Medical Record Number: [id number] Patient Account Number: [id number] Date of Birth/Sex: 1927-12-20 (84 y.o. F) Treating RN: Ahmed Prima Primary Care Raphel Stickles: Carollee Herter, Kendrick Fries Other Clinician: Referring Emilene Roma: Marcelle Overlie Treating Gemini Beaumier/Extender: Cathie Olden in Treatment: 0 Wound Status Wound Number: 3 Primary Pressure Ulcer Etiology: Wound Location: Right, Posterior  Lower Leg Wound Status: Open Wounding Event: Pressure Injury Comorbid Cataracts, Hypertension, Osteoarthritis, Date Acquired: 12/18/2017 History: Neuropathy Weeks Of Treatment: 0 Clustered Wound: No Photos Photo Uploaded By: Roger Shelter on 12/19/2017 16:58:53 Wound Measurements Length: (cm) 0.5 Width: (cm) 0.8 Depth: (cm) 0.1 Area: (cm) 0.314 Volume: (cm) 0.031 % Reduction in Area: % Reduction in Volume: Epithelialization: None Tunneling: No Undermining: No Wound Description Classification: Category/Stage II Wound Margin: Flat and Intact Exudate Amount: None Present Foul Odor After Cleansing: No Slough/Fibrino Yes Wound Bed Granulation Amount: None Present  (0%) Exposed Structure Necrotic Amount: Large (67-100%) Fascia Exposed: No Necrotic Quality: Eschar Fat Layer (Subcutaneous Tissue) Exposed: No Tendon Exposed: No Muscle Exposed: No Joint Exposed: No Bone Exposed: No Periwound Skin Texture Texture Color No Abnormalities Noted: No No Abnormalities Noted: No Pellegrini, Shabreka ([id number]) Moisture Temperature / Pain No Abnormalities Noted: No Temperature: No Abnormality Tenderness on Palpation: Yes Wound Preparation Ulcer Cleansing: Rinsed/Irrigated with Saline Topical Anesthetic Applied: None Electronic Signature(s) Signed: 12/22/2017 5:11:46 PM By: Alric Quan Entered By: Alric Quan on 12/18/2017 15:56:48 Danford, Erleen ([id number]) -------------------------------------------------------------------------------- [place] Details Patient Name: Katherine Owen Date of Service: 12/18/2017 2:30 PM Medical Record Number: [id number] Patient Account Number: [id number] Date of Birth/Sex: 08-Sep-1927 (84 y.o. F) Treating RN: Montey Hora Primary Care Kariel Skillman: Carollee Herter, Kendrick Fries Other Clinician: Referring Dreshawn Hendershott: Marcelle Overlie Treating Charlaine Utsey/Extender: Cathie Olden in Treatment: 0 Vital Signs Time Taken: 15:15 Temperature (F): 98.8 Pulse (bpm): 82 Respiratory Rate (breaths/min): 16 Blood Pressure (mmHg): 103/57 Reference Range: 80 - 120 mg / dl Electronic Signature(s) Signed: 12/18/2017 4:38:21 PM By: Montey Hora Entered By: Montey Hora on 12/18/2017 15:15:36

## 2017-12-24 ENCOUNTER — Encounter: Payer: Self-pay | Admitting: Family Medicine

## 2017-12-24 ENCOUNTER — Other Ambulatory Visit: Payer: Self-pay

## 2017-12-24 ENCOUNTER — Ambulatory Visit (INDEPENDENT_AMBULATORY_CARE_PROVIDER_SITE_OTHER): Payer: Medicare Other | Admitting: Family Medicine

## 2017-12-24 VITALS — BP 130/72 | HR 97 | Temp 98.4°F

## 2017-12-24 DIAGNOSIS — R5381 Other malaise: Secondary | ICD-10-CM | POA: Diagnosis not present

## 2017-12-24 NOTE — Patient Outreach (Signed)
[facility]) Care Management  12/24/2017  Juniper Cobey 24-Sep-1927 [id number]   Telephone Screen  Referral Date: 12/24/17 Referral Source: MD office Referral Reason: " declining health, wounds, deconditioning" Insurance: Medicare   Outreach attempt # 1 to patient.No answer at present. RN CM left HIPAA compliant voicemail message along with contact info.     Plan: RN CM will make outreach attempt to patient within 3-4 business days. RN CM will send unsuccessful outreach letter to patient.    Enzo Montgomery, RN,BSN,CCM [place] Management Telephonic Care Management Coordinator Direct Phone: [phone] Toll Free: [phone] Fax: [phone]

## 2017-12-24 NOTE — Telephone Encounter (Signed)
Med refill requests denied, as all requested medications last refilled on 11/19/17 with 90 day supplies or more.

## 2017-12-24 NOTE — Patient Instructions (Addendum)
We need to get a cushion to help offload the wounds on our bottom. "Donut cushions" may be helpful and can be found easily online or at [employer] or places like [facility].  If you do not hear anything about your referrals in the next 1-2 weeks, call our office and ask for an update.  Continue care with the wound clinic.   Your wounds do not look infected.  Let us know if you need anything.

## 2017-12-24 NOTE — Progress Notes (Signed)
Chief Complaint  Patient presents with  . Wound Check    Katherine Owen is a 84 y.o. female here for a skin complaint. Here with son.  Duration: Chronic Location: b/l legs, buttock Pruritic? No Painful? Yes Drainage? No Has been following with wound care team.  Other associated symptoms: Denies fevers Therapies tried thus far: Santyl Pt is non ambulatory. Fam interested in admission to hospital and/or SAR.  ROS:  Const: No fevers Skin: As noted in HPI  Past Medical History:  Diagnosis Date  . Allergy   . Arthritis   . Cataract    Bilateral  . Essential hypertension 05/02/2014  . Headache   . History of shingles   . HLD (hyperlipidemia) 05/02/2014  . Hyperlipidemia   . Hypertension   . Neuropathic pain 05/02/2014  . Osteoarthritis of both shoulders due to rotator cuff injury 04/23/2016  . Poor mobility 08/28/2015  . 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 Spinal stenosis of lumbar region 05/02/2014   S/p decompression at [facility] November 2015    Allergies  Allergen Reactions  . No Known Allergies      BP 130/72 (BP Location: Left Arm, Patient Position: Sitting, Cuff Size: Large)   Pulse 97   Temp 98.4 F (36.9 C) (Oral)   SpO2 93%  Gen: awake, alert, appearing stated age Lungs: No accessory muscle use Skin: Ulcer noted on b/l LE's and over sacral region, no erythema or drainage. She was examined in presence of female chaperones. No drainage, erythema, fluctuance, excoriation Psych: Age appropriate judgment and insight  Declining functional status - Plan: AMB Referral to [facility], Ambulatory referral to [facility] as above. Donut cushion for daytime use. Discussed rotating to offload areas of interest from pressure. This seems difficult as she is living home alone. I think that getting social work team involved could be helpful as well as home PT could be beneficial at least in moving her body around.  F/u prn w reg pcp. The patient  and her son voiced understanding and agreement to the plan.  Greater than 25 minutes were spent face to face with the patient with greater than 50% of this time spent counseling on ulcers, pressure/load management, aspects of SAR and inpatient admission.    Katherine Park, DO 12/24/17 2:55 PM

## 2017-12-24 NOTE — Progress Notes (Signed)
Pre visit review using our clinic review tool, if applicable. No additional management support is needed unless otherwise documented below in the visit note. 

## 2017-12-26 ENCOUNTER — Other Ambulatory Visit: Payer: Self-pay

## 2017-12-26 DIAGNOSIS — Z96619 Presence of unspecified artificial shoulder joint: Secondary | ICD-10-CM | POA: Diagnosis not present

## 2017-12-26 DIAGNOSIS — S81802D Unspecified open wound, left lower leg, subsequent encounter: Secondary | ICD-10-CM | POA: Diagnosis not present

## 2017-12-26 DIAGNOSIS — M19111 Post-traumatic osteoarthritis, right shoulder: Secondary | ICD-10-CM | POA: Diagnosis not present

## 2017-12-26 DIAGNOSIS — M19112 Post-traumatic osteoarthritis, left shoulder: Secondary | ICD-10-CM | POA: Diagnosis not present

## 2017-12-26 DIAGNOSIS — I10 Essential (primary) hypertension: Secondary | ICD-10-CM | POA: Diagnosis not present

## 2017-12-26 DIAGNOSIS — M48061 Spinal stenosis, lumbar region without neurogenic claudication: Secondary | ICD-10-CM | POA: Diagnosis not present

## 2017-12-26 NOTE — Patient Outreach (Signed)
[facility]) Care Management  12/26/2017  Katherine Owen 1927-11-29 [id number]   Telephone Screen  Referral Date: 12/24/17 Referral Source: MD office Referral Reason: " declining health, wounds, deconditioning" Insurance: Medicare    Voicemail message received from patient. Return call placed to patient. Spoke with patient and screening completed.   Social: Patient resides in her home alone. She vocies that her son lives nearby and assists with her care. She reports that she has "family friends" who serve as aides and orivde care to her seven days a week. She vocies that she has an aide that comes for 2.5-3hrs in the morning and then someone else comes in the evenings 2.5-3hrs. Paitent vocies that she is wheelchair bound(has been for two years) and requires assistance with all ADLs/IADLs. She reports that she uses "county transportation" to get to local appts. However, she voices that when she has appts that are in [place] she has to "rent a Lucianne Lei" which is costing her "$134.00 each time." Patient vocies that this is very expensive. As a result, she has had to cancel several appts. DME in the home include whhelchair and BSC. Patient also inquiring hospital bed and other DME to get in place in the home to assist with safety and helping her caregivers provide easier care.   Conditions: per chart review, patient has PMH of arthritis, HTN, HLD, neuropathic pain, OA and spinal stenosis. Patient voices that she has been dealing with about five wounds for the past six weeks or so. She voices that she I supposed to be going to the wound clinic weekly for wound care. However, due to cost of transportation she has only been going about every two weeks. She reports that her "aides" are changing her dressings. Patient voices that she has "a nurse" that comes out about twice a week to help with her wounds. However, patient unable to recall name of agency or where nurse coming from. She states  that nurse has been coming for about three weeks. Concerns regarding mgmt of wounds,wound healing and overall education/support noted.   Medications: Patient reports that she is taking about ten meds. She voices that she uses mail order pharmacy and denies any issues affording meds. She reports that she is able to manage her meds on her own.   Appointments: Patient followed by PCP and goes to wound clinic.  Consent: [facility] services reviewed and discussed with patient. Verbal consent for services given.      Plan: RN CM will send [facility] SW referral for possible transportation assistance. RN CM will send [facility] community referral for further in home eval/assessment of care needs and mgmt of chronic conditions.    Enzo Montgomery, RN,BSN,CCM [facility] Telephonic Care Management Coordinator Direct Phone: [phone] Toll Free: [phone] Fax: [phone]

## 2017-12-28 ENCOUNTER — Encounter (HOSPITAL_COMMUNITY): Payer: Self-pay | Admitting: Emergency Medicine

## 2017-12-28 ENCOUNTER — Emergency Department (HOSPITAL_COMMUNITY)
Admission: EM | Admit: 2017-12-28 | Discharge: 2017-12-29 | Disposition: A | Discharge status: Home / Self Care | Payer: Medicare Other | Attending: Emergency Medicine | Admitting: Emergency Medicine

## 2017-12-28 DIAGNOSIS — Z96643 Presence of artificial hip joint, bilateral: Secondary | ICD-10-CM | POA: Diagnosis not present

## 2017-12-28 DIAGNOSIS — L97222 Non-pressure chronic ulcer of left calf with fat layer exposed: Secondary | ICD-10-CM | POA: Diagnosis not present

## 2017-12-28 DIAGNOSIS — R52 Pain, unspecified: Secondary | ICD-10-CM | POA: Diagnosis not present

## 2017-12-28 DIAGNOSIS — Z79899 Other long term (current) drug therapy: Secondary | ICD-10-CM | POA: Insufficient documentation

## 2017-12-28 DIAGNOSIS — L89212 Pressure ulcer of right hip, stage 2: Secondary | ICD-10-CM | POA: Diagnosis not present

## 2017-12-28 DIAGNOSIS — R531 Weakness: Secondary | ICD-10-CM | POA: Diagnosis not present

## 2017-12-28 DIAGNOSIS — L8942 Pressure ulcer of contiguous site of back, buttock and hip, stage 2: Secondary | ICD-10-CM | POA: Insufficient documentation

## 2017-12-28 DIAGNOSIS — Z96611 Presence of right artificial shoulder joint: Secondary | ICD-10-CM | POA: Insufficient documentation

## 2017-12-28 DIAGNOSIS — L97922 Non-pressure chronic ulcer of unspecified part of left lower leg with fat layer exposed: Secondary | ICD-10-CM

## 2017-12-28 DIAGNOSIS — I10 Essential (primary) hypertension: Secondary | ICD-10-CM | POA: Diagnosis not present

## 2017-12-28 DIAGNOSIS — L89312 Pressure ulcer of right buttock, stage 2: Secondary | ICD-10-CM | POA: Diagnosis not present

## 2017-12-28 DIAGNOSIS — Z87891 Personal history of nicotine dependence: Secondary | ICD-10-CM | POA: Insufficient documentation

## 2017-12-28 DIAGNOSIS — L97529 Non-pressure chronic ulcer of other part of left foot with unspecified severity: Secondary | ICD-10-CM | POA: Diagnosis present

## 2017-12-28 DIAGNOSIS — L97822 Non-pressure chronic ulcer of other part of left lower leg with fat layer exposed: Secondary | ICD-10-CM | POA: Diagnosis not present

## 2017-12-28 LAB — BASIC METABOLIC PANEL
ANION GAP: 10 (ref 5–15)
BUN: 33 mg/dL — ABNORMAL HIGH (ref 8–23)
CHLORIDE: 101 mmol/L (ref 98–111)
CO2: 26 mmol/L (ref 22–32)
Calcium: 9.6 mg/dL (ref 8.9–10.3)
Creatinine, Ser: 0.8 mg/dL (ref 0.44–1.00)
Glucose, Bld: 91 mg/dL (ref 70–99)
POTASSIUM: 4.7 mmol/L (ref 3.5–5.1)
SODIUM: 137 mmol/L (ref 135–145)

## 2017-12-28 LAB — CBC
HCT: 36.5 % (ref 36.0–46.0)
Hemoglobin: 11.1 g/dL — ABNORMAL LOW (ref 12.0–15.0)
MCH: 26.1 pg (ref 26.0–34.0)
MCHC: 30.4 g/dL (ref 30.0–36.0)
MCV: 85.9 fL (ref 78.0–100.0)
PLATELETS: 231 10*3/uL (ref 150–400)
RBC: 4.25 MIL/uL (ref 3.87–5.11)
RDW: 14.6 % (ref 11.5–15.5)
WBC: 8.6 10*3/uL (ref 4.0–10.5)

## 2017-12-28 LAB — I-STAT CG4 LACTIC ACID, ED: LACTIC ACID, VENOUS: 1.63 mmol/L (ref 0.5–1.9)

## 2017-12-28 MED ORDER — CELECOXIB 200 MG PO CAPS
200.0000 mg | ORAL_CAPSULE | Freq: Every day | ORAL | Status: DC
  Administered 2017-12-29: 200 mg via ORAL
  Filled 2017-12-28 (×2): qty 1

## 2017-12-28 MED ORDER — PANTOPRAZOLE SODIUM 40 MG PO TBEC
40.0000 mg | DELAYED_RELEASE_TABLET | Freq: Every day | ORAL | Status: DC
Start: 2017-12-29 — End: 2017-12-29
  Administered 2017-12-29: 40 mg via ORAL
  Filled 2017-12-28: qty 1

## 2017-12-28 MED ORDER — ATORVASTATIN CALCIUM 10 MG PO TABS: 10.0000 mg | ORAL_TABLET | Freq: Every day | ORAL | Status: DC

## 2017-12-28 MED ORDER — MONTELUKAST SODIUM 10 MG PO TABS: 10.0000 mg | ORAL_TABLET | Freq: Every day | ORAL | Status: DC

## 2017-12-28 MED ORDER — FUROSEMIDE 20 MG PO TABS
40.0000 mg | ORAL_TABLET | Freq: Every day | ORAL | Status: DC
  Administered 2017-12-29: 40 mg via ORAL
  Filled 2017-12-28: qty 2

## 2017-12-28 MED ORDER — TRAMADOL HCL 50 MG PO TABS
50.0000 mg | ORAL_TABLET | Freq: Three times a day (TID) | ORAL | Status: DC
  Administered 2017-12-29 (×2): 50 mg via ORAL
  Filled 2017-12-28 (×2): qty 1

## 2017-12-28 MED ORDER — GABAPENTIN 100 MG PO CAPS
100.0000 mg | ORAL_CAPSULE | Freq: Three times a day (TID) | ORAL | Status: DC
  Administered 2017-12-29 (×2): 100 mg via ORAL
  Filled 2017-12-28 (×2): qty 1

## 2017-12-28 MED ORDER — POTASSIUM CHLORIDE CRYS ER 10 MEQ PO TBCR
10.0000 meq | EXTENDED_RELEASE_TABLET | Freq: Every day | ORAL | Status: DC
  Administered 2017-12-29: 10 meq via ORAL
  Filled 2017-12-28: qty 1

## 2017-12-28 MED ORDER — LOSARTAN POTASSIUM 50 MG PO TABS
100.0000 mg | ORAL_TABLET | Freq: Every day | ORAL | Status: DC
  Administered 2017-12-29: 100 mg via ORAL
  Filled 2017-12-28: qty 2

## 2017-12-28 NOTE — ED Provider Notes (Signed)
[facility] EMERGENCY DEPARTMENT Provider Note   CSN: [id number] Arrival date & time: 12/28/17  1923     History   Chief Complaint Chief Complaint  Patient presents with  . leg and foot pain    HPI Merrit Waugh is a 84 y.o. female.  HPI Patient presents with her son who assist with the HPI. Patient is chronically bedbound, has not walked in about 2 years, and has recently developed pressure ulcers. They now present with concern for persistent pressure ulcers. Specifically the patient's son has concern over a new ulcer on her left heel Patient goes to the wound care clinic, was there 1 week ago, has home health care wound nursing, and was evaluated 2 days ago by them, and has seen her primary care physician within the past week for this concern. No interval fever, though she does state that she feels generally poorly. No appreciated new erythema, bleeding, discharge.  Past Medical History:  Diagnosis Date  . Allergy   . Arthritis   . Cataract    Bilateral  . Essential hypertension 05/02/2014  . Headache   . History of shingles   . HLD (hyperlipidemia) 05/02/2014  . Hyperlipidemia   . Hypertension   . Neuropathic pain 05/02/2014  . Osteoarthritis of both shoulders due to rotator cuff injury 04/23/2016  . Poor mobility 08/28/2015  . 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 Spinal stenosis of lumbar region 05/02/2014   S/p decompression at [facility] November 2015     Patient Active Problem List   Diagnosis Date Noted  . Declining functional status 12/24/2017  . Open wound of left lower leg 11/24/2017  . Open toe wound, initial encounter 11/24/2017  . Lower extremity edema 07/18/2017  . Pain of left heel 04/08/2017  . Dyspepsia 03/18/2017  . Left shoulder pain 07/02/2016  . Cervical disc disorder with radiculopathy of cervical region 07/02/2016  . Osteoarthritis of both shoulders due to rotator cuff injury 04/23/2016  . S/p reverse total  shoulder arthroplasty 01/11/2016  . Poor mobility 08/28/2015  . Physical exam 10/27/2014  . Leg wound, left 06/29/2014  . Leg ulcer ([place]) 05/02/2014  . HLD (hyperlipidemia) 05/02/2014  . CN (constipation) 05/02/2014  . Spinal stenosis of lumbar region 05/02/2014  . Rhinitis, allergic 05/02/2014  . Neuropathic pain 05/02/2014  . Essential hypertension 05/02/2014    Past Surgical History:  Procedure Laterality Date  . ANKLE SURGERY Left 2014  . BACK SURGERY  2015   [facility]   . BUNIONECTOMY Right   . COLONOSCOPY    . EYE SURGERY     cataract  . REVERSE SHOULDER ARTHROPLASTY Right 01/11/2016   Procedure: REVERSE SHOULDER ARTHROPLASTY;  Surgeon: Justice Britain, MD;  Location: [facility];  Service: Orthopedics;  Laterality: Right;  . SHOULDER SURGERY  01/11/2016   REVERSE SHOULDER ARTHROPLASTY on 01/11/2016  . SPINE SURGERY     done at [facility] Apr 26, 2014, lumbar decompression  . TOTAL HIP ARTHROPLASTY Bilateral 2005, 2009   [place] , [place]     OB History   None      Home Medications    Prior to Admission medications   Medication Sig Start Date End Date Taking? Authorizing Provider  atorvastatin (LIPITOR) 10 MG tablet Take 1 tablet (10 mg total) by mouth at bedtime. For high cholesterol 11/19/17   Carollee Herter, [name], DO  celecoxib (CELEBREX) 200 MG capsule Take 1 capsule (200 mg total) by mouth daily. 06/03/16   Roma Schanz  R, DO  Cholecalciferol (VITAMIN D3) 5000 units CAPS Take 1 capsule by mouth at bedtime.    [provider]  doxycycline (VIBRA-TABS) 100 MG tablet Take 1 tablet (100 mg total) by mouth 2 (two) times daily. 11/24/17   Ann Held, DO  esomeprazole (NEXIUM) 20 MG capsule Take 20 mg by mouth daily before breakfast.    [provider]  famotidine (PEPCID) 10 MG tablet Chew 10 mg at bedtime as needed by mouth for heartburn.    [provider]  furosemide (LASIX) 40 MG tablet Take 1 tablet (40 mg total) by mouth daily. 11/19/17    Ann Held, DO  gabapentin (NEURONTIN) 100 MG capsule Take 1 capsule (100 mg total) by mouth 3 (three) times daily. 11/19/17 11/19/18  Roma Schanz R, DO  lidocaine (XYLOCAINE) 4 % external solution Apply topically daily as needed.    [provider]  losartan (COZAAR) 100 MG tablet Take 1 tablet (100 mg total) by mouth daily. 11/19/17   Ann Held, DO  Misc. Devices [facility]) MISC Use as directed 05/02/17   Carollee Herter, [name], DO  montelukast (SINGULAIR) 10 MG tablet Take 1 tablet (10 mg total) by mouth at bedtime. 11/19/17   Ann Held, DO  Multiple Vitamins-Minerals (CENTRUM SILVER) tablet Take 1 tablet by mouth daily.    [provider]  NONFORMULARY OR COMPOUNDED ITEM Lift recliner #1  As directed  Dx spinal stenosis, osteoarthritis both shoulder ,  Low ext weakness 07/18/17   Carollee Herter, [place] R, DO  NONFORMULARY OR COMPOUNDED ITEM velcro compression hose  #1    Dx edema 09/02/17   Carollee Herter, [name], DO  pantoprazole (PROTONIX) 40 MG tablet Take 1 tablet (40 mg total) by mouth daily. 11/19/17   Ann Held, DO  potassium chloride (K-DUR) 10 MEQ tablet Take 1 tablet (10 mEq total) by mouth daily. With each dose of lasix 11/19/17   Carollee Herter, Kendrick Fries R, DO  senna-docusate (SENOKOT-S) 8.6-50 MG per tablet Take 2 tablets by mouth 2 (two) times daily. For constipation     [provider]  silver sulfADIAZINE (SILVADENE) 1 % cream Apply 1 application topically daily. Apply to wound daily. 12/16/17   Stegmayer, Janalyn Harder, PA-C  Simethicone (GAS RELIEF 80 PO) Take 1 tablet daily as needed by mouth (for bloating).    [provider]  traMADol (ULTRAM) 50 MG tablet Take 1 tablet (50 mg total) by mouth 3 (three) times daily. 11/19/17   Ann Held, DO    Family History Family History  Problem Relation Age of Onset  . Arthritis Mother        osteoarthritis  . Colon cancer Mother   . Arthritis Father          osteoarthritis  . Parkinson's disease Father   . Anesthesia problems Neg Hx     Social History Social History   Tobacco Use  . Smoking status: Former Smoker    Packs/day: 1.00    Years: 20.00    Pack years: 20.00    Types: Cigarettes    Last attempt to quit: 04/16/1967    Years since quitting: 50.7  . Smokeless tobacco: Never Used  . Tobacco comment: quit somking in early  1970's  Substance Use Topics  . Alcohol use: No    Alcohol/week: 0.0 oz  . Drug use: No     Allergies   No known allergies  Review of Systems Review of Systems  Constitutional:       Per HPI, otherwise negative  HENT:       Per HPI, otherwise negative  Respiratory:       Per HPI, otherwise negative  Cardiovascular:       Per HPI, otherwise negative  Gastrointestinal: Negative for vomiting.  Endocrine:       Negative aside from HPI  Genitourinary:       Neg aside from HPI   Musculoskeletal:       Per HPI, otherwise negative  Skin: Positive for wound.  Neurological: Positive for weakness. Negative for syncope.     Physical Exam Updated Vital Signs BP (!) 103/92   Pulse 73   Temp 98.7 F (37.1 C) (Oral)   Resp 15   Ht 5' 4.5" (1.638 m)   Wt 83.9 kg (185 lb)   SpO2 93%   BMI 31.26 kg/m   Physical Exam  Constitutional: She is oriented to person, place, and time. She has a sickly appearance. No distress.  HENT:  Head: Normocephalic and atraumatic.  Eyes: Conjunctivae and EOM are normal.  Cardiovascular: Normal rate and regular rhythm.  Pulmonary/Chest: Effort normal and breath sounds normal. No stridor. No respiratory distress.  Abdominal: She exhibits no distension.  Genitourinary:  Genitourinary Comments: Superior cleft decubitus ulcer, not full dermal depth surrounding erythema  Musculoskeletal: She exhibits no edema.  Neurological: She is alert and oriented to person, place, and time. She displays atrophy. She displays no tremor. No cranial nerve deficit. She exhibits  abnormal muscle tone. She displays no seizure activity. Coordination normal.  Skin: Skin is warm and dry.     Left leg than right in size in the lower calf and ankle, no substantial erythema in either, no bleeding, no discharge, no drainage, aside from the small open wounds, as documented.  Psychiatric: She has a normal mood and affect.  Nursing note and vitals reviewed.    ED Treatments / Results  Labs (all labs ordered are listed, but only abnormal results are displayed) Labs Reviewed  CBC - Abnormal; Notable for the following components:      Result Value   Hemoglobin 11.1 (*)    All other components within normal limits  BASIC METABOLIC PANEL - Abnormal; Notable for the following components:   BUN 33 (*)    All other components within normal limits  I-STAT CG4 LACTIC ACID, ED  I-STAT CG4 LACTIC ACID, ED     Procedures Procedures (including critical care time)  Medications Ordered in ED Medications - No data to display   Initial Impression / Assessment and Plan / ED Course  I have reviewed the triage vital signs and the nursing notes.  Pertinent labs & imaging results that were available during my care of the patient were reviewed by me and considered in my medical decision making (see chart for details).    Chart review performed notable for ongoing evaluation by the patient's physician, as well as enrollment in trying health network, with home health services, discussion of additional service options.  This elderly female presents with her son due to concern of ongoing pressure ulcers. Patient is awake and alert, though sickly in appearance she is in no distress she is hemodynamically stable, afebrile. Patient's valuation are notable for the menstruation of multiple ulcers, but no surrounding erythema consistent with cellulitis, no lab evidence for bacteremia or sepsis.  Patient awake and alert, in similar condition. Patient has multiple wounds, and  I discussed  these again with the patient's son. As the patient lives alone, is incapable of caring for herself, and has had progression of her wound in spite of previously arranged home health care, we discussed options for additional management, and the son notes the patient is not appropriate for returning home given her solitary status, worsening ulcers. Patient will be boarded overnight for morning case management and social work evaluation, with consideration for placement and/or assisted with additional home health services.   Final Clinical Impressions(s) / ED Diagnoses  Nonhealing skin ulcers, multiple Decubitus ulcer   Carmin Muskrat, MD 12/28/17 2237

## 2017-12-28 NOTE — ED Triage Notes (Signed)
Brought by ems from home for c/o sore on feet and legs.  Per family patient is being seen at the wound clinic on Thursday the 27th.  Also reports she just isn't feeling well.  C/o nausea.  Patient has been unable to walk for two years.

## 2017-12-28 NOTE — ED Notes (Signed)
Pink pressure dressing applied to sacrum and left leg, left heel wrapped with kerlex.

## 2017-12-29 ENCOUNTER — Ambulatory Visit: Payer: Self-pay

## 2017-12-29 DIAGNOSIS — L97222 Non-pressure chronic ulcer of left calf with fat layer exposed: Secondary | ICD-10-CM | POA: Diagnosis not present

## 2017-12-29 DIAGNOSIS — Z7401 Bed confinement status: Secondary | ICD-10-CM | POA: Diagnosis not present

## 2017-12-29 DIAGNOSIS — M2550 Pain in unspecified joint: Secondary | ICD-10-CM | POA: Diagnosis not present

## 2017-12-29 NOTE — Telephone Encounter (Signed)
Pt saw Dr. Nani Ravens on 12/24/17 pt had another sore come up on Sunday so son took her to  [place] has appt with wound clinic Thurs. And if he needs to see Dr. Etter Sjogren after they go to wound clinic he will call and make that appt.

## 2017-12-29 NOTE — Progress Notes (Signed)
Patient has Medicare A and B- unable to be placed from ED at this time- EDP aware. Case manager consulted for [facility].   Kingsley Spittle, LCSW Emergency Room Clinical Social Worker [phone]

## 2017-12-29 NOTE — ED Notes (Signed)
Heart Healthy Diet was ordered for Lunch. 

## 2017-12-29 NOTE — ED Notes (Signed)
Pt complaining of pain in legs offered medication and pt agreed to take medication for pain.

## 2017-12-29 NOTE — Discharge Planning (Signed)
Jayd Forrey J. Clydene Laming, RN, BSN, [employer] [phone] Spoke with pt and son at bedside regarding discharge planning for [facility]. Offered pt list of home health agencies to choose from.  Pt chose [facility] to render services. Danny Lawless of [facility] notified. Patient made aware that [employer] will be in contact in 24-48 hours.  No DME needs identified at this time.

## 2017-12-29 NOTE — ED Notes (Signed)
Pt home via [facility] .

## 2017-12-30 ENCOUNTER — Telehealth: Payer: Self-pay | Admitting: Family Medicine

## 2017-12-30 ENCOUNTER — Other Ambulatory Visit: Payer: Self-pay

## 2017-12-30 DIAGNOSIS — I10 Essential (primary) hypertension: Secondary | ICD-10-CM | POA: Diagnosis not present

## 2017-12-30 DIAGNOSIS — S81802D Unspecified open wound, left lower leg, subsequent encounter: Secondary | ICD-10-CM | POA: Diagnosis not present

## 2017-12-30 DIAGNOSIS — M19112 Post-traumatic osteoarthritis, left shoulder: Secondary | ICD-10-CM | POA: Diagnosis not present

## 2017-12-30 DIAGNOSIS — M48061 Spinal stenosis, lumbar region without neurogenic claudication: Secondary | ICD-10-CM | POA: Diagnosis not present

## 2017-12-30 DIAGNOSIS — Z96619 Presence of unspecified artificial shoulder joint: Secondary | ICD-10-CM | POA: Diagnosis not present

## 2017-12-30 DIAGNOSIS — M19111 Post-traumatic osteoarthritis, right shoulder: Secondary | ICD-10-CM | POA: Diagnosis not present

## 2017-12-30 NOTE — Telephone Encounter (Signed)
Copied from [place] [phone]. Topic: Quick Communication - See Telephone Encounter >> Dec 30, 2017  2:12 PM Valla Leaver wrote: CRM for notification. See Telephone encounter for: 12/30/17. Suanne Marker, RN, with [facility], [phone], calling to request orders for skilled nursing, home health aid, social work. Please call back to approve or deny.

## 2017-12-30 NOTE — Patient Outreach (Signed)
[facility]) Care Management  12/30/2017  Graylyn Bunney Oct 21, 1927 [id number]  Initial outreach to the patient on today's date, HIPAA identifiers confirmed. BSW introduced self to the patient and the reason for today's call indicating the patients physician referred the patient for transportation assistance. The patient currently uses "county transportation" but needs affordable options for appointments in [place]. Patient states her son rents a Lucianne Lei to transport her to [place] appointments but it is very costly. BSW discussed the ability for [facility] to transport to [place] once a week. Patient states it is too long to be away from home due to incontinence.   Since the patients original referral date, the patient has been to the ED for concerns regarding non-healing wounds. The patient indicates she no longer is concerned with transportation resources but would rather discuss long-term placement. The patient lives alone with the support of in home caregivers a few hours each day. The patient states "both my shoulders and arms are useless. They don't know how to handle me and they can't take care of my wounds".  The patient denies home health services at this time. The patient denies being contacting by [facility] since returning home from the hospital. The patient gave this BSW verbal permission to contact [facility] at the end of this call to inquire about start of care.  BSW explained to the patient that this BSW would refer to our CSW who serves [facility] for long-term placement. The patient stated "I want to go into a facility that takes care of wounds. Services at home can't help me anymore". The patient was educated on the importance of continuing home health nursing services until long-term placement is achieved.  BSW contacted [facility] and was informed the patient was an existing patient and was last seen on July 5th with no future  appointments in the system. BSW explained the situation and was given Danny Lawless [phone]) contact to further discuss. BSW spoke with Dian Situ who stated he would call the office to confirm the patient is placed back on the schedule for continued wound care.   Plan: BSW to notify [facility] RNCM Tomasa Rand of patients desire for placement BSW to send patient referral to [place] for further assistance.  BSW to remove self from patients care team at this time.  Daneen Schick, BSW, CDP [facility] [phone]

## 2017-12-30 NOTE — Telephone Encounter (Signed)
FYI

## 2017-12-31 ENCOUNTER — Other Ambulatory Visit: Payer: Self-pay

## 2017-12-31 ENCOUNTER — Other Ambulatory Visit: Payer: Self-pay | Admitting: *Deleted

## 2017-12-31 NOTE — Patient Outreach (Signed)
Telephone call:  New referral for wounds:  Placed call to patient and spoke with son Rubie Maid.  Explained reason for call and offered home visit with myself and [facility] social worker for assessment of needs and goals. Son has agreed to home visit on 01/02/2018 at 12:00 noon.  Provided my contact information and confirmed address.  In basket message sent to [facility] social worker Eduard Clos.  Tomasa Rand, RN, BSN, CEN [facility] [employer] [phone]

## 2017-12-31 NOTE — Patient Outreach (Signed)
[facility]) Care Management  12/31/2017  Katherine Owen March 29, 1928 [id number]   CSW was able to make initial phone contact with patient today. Identity was confirmed and CSW then introduced self and role and reason for call; consult for long term placement. Pt indicates that she would like to got to "Short term rehab" and states she lives alone and needs more help at home than she currently has. Pt states, "I can't use my legs and I have one bad rotator cuff and the other arm has arthritis".  She indicates her son lives nearby and comes by to help as he can, and she has "2 hours of help in the morning and 2 hours in the evening".   CSW asked pt some questions in an attempt to determine what level of care she likely would meet criteria for insurance to cover. Pt believes that her insurance (Medicare) will cover "90 days".  CSW explained to pt and also to son that she would have to have skilled level needs for Medicare SNF coverage. Pt reports she has some lower extremity "sores" that she is going to the [facility] for treatment. CSW discussed with pt and son, Katherine Owen, that [place] will need to assess further with RN to see if she is eligible for SNF care/coverage.   CSW contacted Tomasa Rand, RN, [facility], who has has arranged for a home visit on 01/02/2018. CSW and RN will meet with son and patient to complete assessment and determine goals and plan of care.    Eduard Clos, MSW, [place] Worker  [place] [phone]

## 2018-01-01 ENCOUNTER — Encounter: Payer: Medicare Other | Attending: Nurse Practitioner | Admitting: Nurse Practitioner

## 2018-01-01 DIAGNOSIS — Z993 Dependence on wheelchair: Secondary | ICD-10-CM | POA: Insufficient documentation

## 2018-01-01 DIAGNOSIS — Z823 Family history of stroke: Secondary | ICD-10-CM | POA: Insufficient documentation

## 2018-01-01 DIAGNOSIS — M19111 Post-traumatic osteoarthritis, right shoulder: Secondary | ICD-10-CM | POA: Diagnosis not present

## 2018-01-01 DIAGNOSIS — M48061 Spinal stenosis, lumbar region without neurogenic claudication: Secondary | ICD-10-CM | POA: Diagnosis not present

## 2018-01-01 DIAGNOSIS — L89619 Pressure ulcer of right heel, unspecified stage: Secondary | ICD-10-CM | POA: Insufficient documentation

## 2018-01-01 DIAGNOSIS — Z96619 Presence of unspecified artificial shoulder joint: Secondary | ICD-10-CM | POA: Diagnosis not present

## 2018-01-01 DIAGNOSIS — Z87891 Personal history of nicotine dependence: Secondary | ICD-10-CM | POA: Insufficient documentation

## 2018-01-01 DIAGNOSIS — I739 Peripheral vascular disease, unspecified: Secondary | ICD-10-CM | POA: Diagnosis not present

## 2018-01-01 DIAGNOSIS — L89893 Pressure ulcer of other site, stage 3: Secondary | ICD-10-CM | POA: Diagnosis not present

## 2018-01-01 DIAGNOSIS — I10 Essential (primary) hypertension: Secondary | ICD-10-CM | POA: Diagnosis not present

## 2018-01-01 DIAGNOSIS — R609 Edema, unspecified: Secondary | ICD-10-CM | POA: Insufficient documentation

## 2018-01-01 DIAGNOSIS — L89629 Pressure ulcer of left heel, unspecified stage: Secondary | ICD-10-CM | POA: Diagnosis not present

## 2018-01-01 DIAGNOSIS — L89892 Pressure ulcer of other site, stage 2: Secondary | ICD-10-CM | POA: Diagnosis not present

## 2018-01-01 DIAGNOSIS — S91105A Unspecified open wound of left lesser toe(s) without damage to nail, initial encounter: Secondary | ICD-10-CM | POA: Diagnosis not present

## 2018-01-01 DIAGNOSIS — L89153 Pressure ulcer of sacral region, stage 3: Secondary | ICD-10-CM | POA: Insufficient documentation

## 2018-01-01 DIAGNOSIS — G629 Polyneuropathy, unspecified: Secondary | ICD-10-CM | POA: Insufficient documentation

## 2018-01-01 DIAGNOSIS — S81802D Unspecified open wound, left lower leg, subsequent encounter: Secondary | ICD-10-CM | POA: Diagnosis not present

## 2018-01-01 DIAGNOSIS — I872 Venous insufficiency (chronic) (peripheral): Secondary | ICD-10-CM | POA: Diagnosis not present

## 2018-01-01 DIAGNOSIS — M19112 Post-traumatic osteoarthritis, left shoulder: Secondary | ICD-10-CM | POA: Diagnosis not present

## 2018-01-01 NOTE — Telephone Encounter (Signed)
Ok to approve 

## 2018-01-02 ENCOUNTER — Other Ambulatory Visit: Payer: Self-pay | Admitting: *Deleted

## 2018-01-02 ENCOUNTER — Other Ambulatory Visit: Payer: Self-pay

## 2018-01-02 ENCOUNTER — Telehealth: Payer: Self-pay | Admitting: *Deleted

## 2018-01-02 DIAGNOSIS — M19111 Post-traumatic osteoarthritis, right shoulder: Secondary | ICD-10-CM | POA: Diagnosis not present

## 2018-01-02 DIAGNOSIS — M19112 Post-traumatic osteoarthritis, left shoulder: Secondary | ICD-10-CM | POA: Diagnosis not present

## 2018-01-02 DIAGNOSIS — M48061 Spinal stenosis, lumbar region without neurogenic claudication: Secondary | ICD-10-CM | POA: Diagnosis not present

## 2018-01-02 DIAGNOSIS — S81802D Unspecified open wound, left lower leg, subsequent encounter: Secondary | ICD-10-CM | POA: Diagnosis not present

## 2018-01-02 DIAGNOSIS — I10 Essential (primary) hypertension: Secondary | ICD-10-CM | POA: Diagnosis not present

## 2018-01-02 DIAGNOSIS — Z96619 Presence of unspecified artificial shoulder joint: Secondary | ICD-10-CM | POA: Diagnosis not present

## 2018-01-02 NOTE — Patient Outreach (Signed)
[facility]) Care Management   01/02/2018  Katherine Owen 22-Feb-1928 [id number]  Katherine Owen is an 84 y.o. female Joint visit with [facility] social worker, Katherine Owen Son Katherine Owen and daughter in Sports coach. Subjective:  Patient reports she has worsening wounds in the last 2 weeks.  Patient reports she he chair and bed bound. Patient reports she has caregiver services 4 hours per day.  Home health nursing through [facility] for wound care. Patient attends wound center in [place]. Patient's son rents a Lucianne Lei from [employer] for 133.00 pre visit..  Objective:  Awake and alert. Sitting in wheelchair at table.    Today's Vitals   01/02/18 1228 01/02/18 1239  BP: 118/64   Pulse: 96   Resp: 18   SpO2: 92%   Weight: 175 lb (79.4 kg)   Height: 1.626 m (5\' 4" )   PainSc:  7    Review of Systems  Constitutional: Positive for malaise/fatigue.  HENT: Negative.   Eyes:       Wears glasses  Respiratory: Negative.   Cardiovascular: Positive for leg swelling.  Gastrointestinal: Positive for heartburn and nausea.  Genitourinary:       Reports she is able to get onto commode with lift assistance.   Musculoskeletal: Negative.   Skin:       Reports bedsore on buttock, both legs/calfs, and toes and left heel with a blister.  New onset of blister on left heel in the last week.     Physical Exam  Constitutional: She is oriented to person, place, and time. She appears well-developed and well-nourished.  Cardiovascular: Normal rate, normal heart sounds and intact distal pulses.  Respiratory: Effort normal and breath sounds normal.  GI: Soft. Bowel sounds are normal.  Musculoskeletal: She exhibits edema.  Unable to wiggle toes.  Unable to stand or walk.   Neurological: She is alert and oriented to person, place, and time.  Skin: Skin is warm and dry.  Wounds noted that are dressed from home health nurse today.  Right calf:   Left Calf: Left heel: Buttocks:   Only  visualized wound on the left heel noted to be a blister with no drainage. Dressing intact to wounds.  Visualized pictures from son. Seen at the wound clinic on 01/01/2018.   Psychiatric: She has a normal mood and affect. Her behavior is normal. Judgment and thought content normal.    Encounter Medications:   Outpatient Encounter Medications as of 01/02/2018  Medication Sig  . atorvastatin (LIPITOR) 10 MG tablet Take 1 tablet (10 mg total) by mouth at bedtime. For high cholesterol  . celecoxib (CELEBREX) 200 MG capsule Take 1 capsule (200 mg total) by mouth daily.  . Cholecalciferol (VITAMIN D3) 5000 units CAPS Take 5,000 Units by mouth at bedtime.   . famotidine (PEPCID) 10 MG tablet Take 10 mg by mouth at bedtime.   . furosemide (LASIX) 40 MG tablet Take 1 tablet (40 mg total) by mouth daily.  Marland Kitchen gabapentin (NEURONTIN) 100 MG capsule Take 1 capsule (100 mg total) by mouth 3 (three) times daily.  Marland Kitchen lidocaine (XYLOCAINE) 4 % external solution Apply topically daily as needed for mild pain or moderate pain (shoulders, neck and back pain).   Marland Kitchen losartan (COZAAR) 100 MG tablet Take 1 tablet (100 mg total) by mouth daily.  . Misc. Devices [facility]) MISC Use as directed  . montelukast (SINGULAIR) 10 MG tablet Take 1 tablet (10 mg total) by mouth at bedtime.  . Multiple Vitamins-Minerals (CENTRUM SILVER)  tablet Take 1 tablet by mouth daily.  . NONFORMULARY OR COMPOUNDED ITEM Lift recliner #1  As directed  Dx spinal stenosis, osteoarthritis both shoulder ,  Low ext weakness  . OVER THE COUNTER MEDICATION 1 application 2 (two) times daily as needed. Lemon Salve ( topical hemp extract cream  . pantoprazole (PROTONIX) 40 MG tablet Take 1 tablet (40 mg total) by mouth daily.  Marland Kitchen SANTYL ointment Apply 1 application topically daily.  Marland Kitchen senna-docusate (SENOKOT-S) 8.6-50 MG per tablet Take 1 tablet by mouth at bedtime. For constipation   . Simethicone (GAS RELIEF 80 PO) Take 1 tablet daily as needed by mouth  (for bloating).  . traMADol (ULTRAM) 50 MG tablet Take 1 tablet (50 mg total) by mouth 3 (three) times daily.  . [DISCONTINUED] esomeprazole (NEXIUM) 20 MG capsule Take 20 mg by mouth daily before breakfast.  . [DISCONTINUED] Menthol-Zinc Oxide (CALMOSEPTINE EX) Apply 1 application topically daily. Use on buttock  . [DISCONTINUED] NONFORMULARY OR COMPOUNDED ITEM velcro compression hose  #1    Dx edema  . [DISCONTINUED] potassium chloride (K-DUR) 10 MEQ tablet Take 1 tablet (10 mEq total) by mouth daily. With each dose of lasix   No facility-administered encounter medications on file as of 01/02/2018.     Functional Status:   In your present state of health, do you have any difficulty performing the following activities: 01/02/2018  Hearing? Y  Vision? Y  Difficulty concentrating or making decisions? N  Walking or climbing stairs? Y  Dressing or bathing? Y  Doing errands, shopping? Y  Preparing Food and eating ? Y  Using the Toilet? Y  In the past six months, have you accidently leaked urine? N  Do you have problems with loss of bowel control? N  Managing your Medications? Y  Managing your Finances? Y  Housekeeping or managing your Housekeeping? Y  Some recent data might be hidden    Fall/Depression Screening:    Fall Risk  01/02/2018 12/26/2017 09/02/2017  Falls in the past year? Yes No No  Comment slipped out of the chair/ - -  Number falls in past yr: - - -  Injury with Fall? - - -  Risk for fall due to : Impaired balance/gait Impaired vision;Impaired balance/gait;Impaired mobility;Medication side effect -  Risk for fall due to: Comment - - -   PHQ 2/9 Scores 12/26/2017 09/02/2017 05/21/2016 01/04/2016 10/24/2015 10/27/2014 06/29/2014  PHQ - 2 Score 0 0 0 0 0 0 0    Assessment:   (1) reviewed [facility] program and provided a new patient packet. Provided [facility] calendar and my contact card. Reviewed consent and patient signed consent. (2) patient and family interested in placement for wounds care.   (3) Active with [facility] home health nursing for wound care. Home health nurse saw patient prior to my arrival and dressing changes were completed.  (4) multiple pressure ulcers on both legs, buttock and left heel.  (5) takes her medications as prescribed.  (6) transportation concerns.   Plan:  (1) consent scanned into chart. (2) [facility] social worker, Katherine Owen present and working with patient and family. (3) Encouraged patient to follow direction of home health nurse and wound center. Reviewed signs of infection. (4) Reviewed ways to relieve pressure like using a wedge for propping, Elevation heel off wheelchair foot rest, Heel protectors, gel cushions for wheelchair and or bed. Reviewed speciality air beds. Encouraged patient and or son to report any changes to wound to MD and wound clinic.  (6)  active with [facility] Education officer, museum.  Reviewed needs with patient, son, [facility] social worker and myself. Currently patients nursing needs are being meet with home health nurse for dressing changes. Will close case to nursing and but will remain active with [facility] social worker for needs that were identified.  Will reopen if needed.   No care plan at this time.  Barrier/ update letter sent to MD. This note sent to MD.   Tomasa Rand, RN, BSN, CEN [facility] Coordinator [phone]

## 2018-01-02 NOTE — Telephone Encounter (Signed)
Received Physician Orders from [facility]; forwarded to provider/SLS 07/12

## 2018-01-02 NOTE — Telephone Encounter (Addendum)
Called RN left message regarding approval on [facility] forSilled nursing,Home health and Social work.

## 2018-01-03 NOTE — Progress Notes (Signed)
Katherine Owen ([id number]) Visit Report for 01/01/2018 Chief Complaint Document Details Patient Name: Katherine Owen Date of Service: 01/01/2018 1:15 PM Medical Record Number: [id number] Patient Account Number: [id number] Date of Birth/Sex: 1927/12/03 (84 y.o. F) Treating RN: Ahmed Prima Primary Care Provider: Roma Schanz Other Clinician: Referring Provider: Carollee Herter, Kendrick Fries Treating Provider/Extender: Cathie Olden in Treatment: 2 Information Obtained from: Patient Chief Complaint multiple ulcers Electronic Signature(s) Signed: 01/01/2018 4:54:38 PM By: Lawanda Cousins Entered By: Lawanda Cousins on 01/01/2018 16:54:38 Katherine Owen ([id number]) -------------------------------------------------------------------------------- Debridement Details Patient Name: Katherine Owen Date of Service: 01/01/2018 1:15 PM Medical Record Number: [id number] Patient Account Number: [id number] Date of Birth/Sex: 1928-01-20 (84 y.o. F) Treating RN: Secundino Ginger Primary Care Provider: Carollee Herter, Kendrick Fries Other Clinician: Referring Provider: Roma Schanz Treating Provider/Extender: Cathie Olden in Treatment: 2 Debridement Performed for Wound #5 Coccyx Assessment: Performed By: Physician Lawanda Cousins, NP Debridement Type: Debridement Pre-procedure Verification/Time Yes - 14:11 Out Taken: Start Time: 14:11 Pain Control: Lidocaine 4% Topical Solution Total Area Debrided (L x W): 2.5 (cm) x 2.5 (cm) = 6.25 (cm) Tissue and other material Viable, Non-Viable, Slough, Subcutaneous, Fibrin/Exudate, Slough debrided: Level: Skin/Subcutaneous Tissue Debridement Description: Excisional Instrument: Curette Bleeding: Minimum Hemostasis Achieved: Pressure End Time: 14:13 Procedural Pain: 0 Post Procedural Pain: 0 Response to Treatment: Procedure was tolerated well Level of Consciousness: Awake and Alert Post Debridement Measurements of Total Wound Length: (cm)  2.5 Stage: Category/Stage III Width: (cm) 2.5 Depth: (cm) 0.2 Volume: (cm) 0.982 Character of Wound/Ulcer Post Requires Further Debridement Debridement: Post Procedure Diagnosis Same as Pre-procedure Electronic Signature(s) Signed: 01/01/2018 3:51:06 PM By: Secundino Ginger Signed: 01/01/2018 9:10:59 PM By: Lawanda Cousins Entered By: Secundino Ginger on 01/01/2018 14:12:17 Katherine Owen ([id number]) -------------------------------------------------------------------------------- Debridement Details Patient Name: Katherine Owen Date of Service: 01/01/2018 1:15 PM Medical Record Number: [id number] Patient Account Number: [id number] Date of Birth/Sex: 1927-07-19 (84 y.o. F) Treating RN: Ahmed Prima Primary Care Provider: Carollee Herter, Kendrick Fries Other Clinician: Referring Provider: Roma Schanz Treating Provider/Extender: Cathie Olden in Treatment: 2 Debridement Performed for Wound #1 Left,Lateral Lower Leg Assessment: Performed By: Physician Lawanda Cousins, NP Debridement Type: Debridement Severity of Tissue Pre Fat layer exposed Debridement: Pre-procedure Verification/Time Yes - 14:11 Out Taken: Start Time: 14:14 Pain Control: Lidocaine 4% Topical Solution Total Area Debrided (L x W): 3.6 (cm) x 1.9 (cm) = 6.84 (cm) Tissue and other material Viable, Non-Viable, Slough, Subcutaneous, Fibrin/Exudate, Slough debrided: Level: Skin/Subcutaneous Tissue Debridement Description: Excisional Instrument: Curette Bleeding: Minimum Hemostasis Achieved: Pressure End Time: 14:16 Procedural Pain: 0 Post Procedural Pain: 0 Response to Treatment: Procedure was tolerated well Level of Consciousness: Awake and Alert Post Debridement Measurements of Total Wound Length: (cm) 3.6 Width: (cm) 1.9 Depth: (cm) 0.4 Volume: (cm) 2.149 Character of Wound/Ulcer Post Debridement: Requires Further Debridement Severity of Tissue Post Debridement: Fat layer exposed Post Procedure  Diagnosis Same as Pre-procedure Electronic Signature(s) Signed: 01/01/2018 2:57:11 PM By: Alric Quan Signed: 01/01/2018 9:10:59 PM By: Lawanda Cousins Entered By: Alric Quan on 01/01/2018 14:57:10 Katherine Owen ([id number]) -------------------------------------------------------------------------------- Debridement Details Patient Name: Katherine Owen Date of Service: 01/01/2018 1:15 PM Medical Record Number: [id number] Patient Account Number: [id number] Date of Birth/Sex: 1928-04-21 (84 y.o. F) Treating RN: Ahmed Prima Primary Care Provider: Roma Schanz Other Clinician: Referring Provider: Roma Schanz Treating Provider/Extender: Cathie Olden in Treatment: 2 Debridement Performed for Wound #3 Right,Posterior Lower Leg Assessment: Performed By: Physician Lawanda Cousins, NP Debridement Type: Debridement Pre-procedure Verification/Time Yes - 14:11 Out Taken: Start Time: 14:16  Pain Control: Lidocaine 4% Topical Solution Total Area Debrided (L x W): 1.5 (cm) x 1.3 (cm) = 1.95 (cm) Tissue and other material Viable, Non-Viable, Slough, Subcutaneous, Fibrin/Exudate, Slough debrided: Level: Skin/Subcutaneous Tissue Debridement Description: Excisional Instrument: Curette Bleeding: Minimum Hemostasis Achieved: Pressure End Time: 14:18 Procedural Pain: 0 Post Procedural Pain: 0 Response to Treatment: Procedure was tolerated well Level of Consciousness: Awake and Alert Post Debridement Measurements of Total Wound Length: (cm) 1.5 Stage: Category/Stage II Width: (cm) 1.3 Depth: (cm) 0.3 Volume: (cm) 0.459 Character of Wound/Ulcer Post Requires Further Debridement Debridement: Post Procedure Diagnosis Same as Pre-procedure Electronic Signature(s) Signed: 01/01/2018 2:57:47 PM By: Alric Quan Signed: 01/01/2018 9:10:59 PM By: Lawanda Cousins Entered By: Alric Quan on 01/01/2018 14:57:47 Katherine Owen  ([id number]) -------------------------------------------------------------------------------- HPI Details Patient Name: Katherine Owen Date of Service: 01/01/2018 1:15 PM Medical Record Number: [id number] Patient Account Number: [id number] Date of Birth/Sex: 28-Aug-1927 (84 y.o. F) Treating RN: Ahmed Prima Primary Care Provider: Roma Schanz Other Clinician: Referring Provider: Roma Schanz Treating Provider/Extender: Cathie Olden in Treatment: 2 History of Present Illness HPI Description: 12/18/17-She is here for initial evaluation of a left posterior calf pressure ulcer, she suspects from the foot rest of her wheelchair. She is wheelchair/bed bound. She says it has been present for approximately 1 month, beginning as a blister. They have been using silvadene topically. She did have an arterial study today but the results are not available for review. She denies pain at rest, admits to pain with pressure and manipulation. She has been wearing Unna boots and tolerating but there are areas of erythema, concerning for pressure to the bilateral anterior ankle and dorsal aspect of the right foot; we will hold off on ace wrap and use ace wraps at this time. She lives with family and has home health. She is non-diabetic and does not smoke. 01/01/18-She is here in follow-up evaluation for multiple ulcers. The left posterior calf pressure ulcer is responding to Santyl and we will continue. The area to the right posterior calf is now eschar (appeared to be hematoma likely last evaluation). She presents with a new deep tissue injury, intact blister, to the right posterior/lateral heel and also presents with a stage III sacral ulcer. She states to sacral ulcer was present on admission but she deferred evaluation at that time. Extensive education was provided to the patient and the family regarding need for offloading and the complexity regarding multiple areas needing offloading; it  was clearly explained that offloading one area of pressure may lead to development of new pressure injury. Referral to medical modalities for wheelchair cushion and appropriate bed/mattress. Electronic Signature(s) Signed: 01/01/2018 4:57:25 PM By: Lawanda Cousins Entered By: Lawanda Cousins on 01/01/2018 16:57:25 Baisch, [name] ([id number]) -------------------------------------------------------------------------------- Physician Orders Details Patient Name: Katherine Owen Date of Service: 01/01/2018 1:15 PM Medical Record Number: [id number] Patient Account Number: [id number] Date of Birth/Sex: 1927-09-08 (84 y.o. F) Treating RN: Secundino Ginger Primary Care Provider: Carollee Herter, Kendrick Fries Other Clinician: Referring Provider: Carollee Herter, Kendrick Fries Treating Provider/Extender: Cathie Olden in Treatment: 2 Verbal / Phone Orders: Yes Clinician: Secundino Ginger Read Back and Verified: Yes Diagnosis Coding Wound Cleansing Wound #1 Left,Lateral Lower Leg o Clean wound with Normal Saline. o Cleanse wound with mild soap and water Wound #2 Left Toe Second o Clean wound with Normal Saline. o Cleanse wound with mild soap and water Wound #3 Right,Posterior Lower Leg o Clean wound with Normal Saline. o Cleanse wound with mild soap and water Wound #5 Coccyx   o Clean wound with Normal Saline. o Cleanse wound with mild soap and water Anesthetic (add to Medication List) Wound #1 Left,Lateral Lower Leg o Topical Lidocaine 4% cream applied to wound bed prior to debridement (In Clinic Only). Wound #2 Left Toe Second o Topical Lidocaine 4% cream applied to wound bed prior to debridement (In Clinic Only). Wound #3 Right,Posterior Lower Leg o Topical Lidocaine 4% cream applied to wound bed prior to debridement (In Clinic Only). Wound #5 Coccyx o Topical Lidocaine 4% cream applied to wound bed prior to debridement (In Clinic Only). Primary Wound Dressing Wound #1 Left,Lateral Lower  Leg o Saline moistened gauze o Santyl Ointment Wound #2 Left Toe Second o Hydrafera Blue Ready Transfer Wound #3 Right,Posterior Lower Leg o Saline moistened gauze o Santyl Ointment Wound #4 Left Calcaneus o Other: - betadine paint Carrithers, Aylin ([id number]) Wound #5 Coccyx o Saline moistened gauze o Santyl Ointment Secondary Dressing Wound #1 Left,Lateral Lower Leg o Dry Gauze o [place] Wound #2 Left Toe Second o Dry Gauze o Conform/Kerlix Wound #3 Right,Posterior Lower Leg o Dry Gauze o [place] Wound #5 Coccyx o Boardered Foam Dressing Dressing Change Frequency Wound #1 Left,Lateral Lower Leg o Change dressing every day. Wound #2 Left Toe Second o Change dressing every other day. Wound #3 Right,Posterior Lower Leg o Change dressing every other day. Wound #4 Left Calcaneus o Change dressing every other day. Wound #5 Coccyx o Change dressing every other day. Follow-up Appointments Wound #1 Left,Lateral Lower Leg o Return Appointment in 2 weeks. Wound #2 Left Toe Second o Return Appointment in 2 weeks. Wound #3 Right,Posterior Lower Leg o Return Appointment in 2 weeks. Wound #4 Left Calcaneus o Return Appointment in 2 weeks. Wound #5 Coccyx o Return Appointment in 2 weeks. Edema Control Wound #1 Left,Lateral Lower Leg o Elevate legs to the level of the heart and pump ankles as often as possible Mckown, Ceylin ([id number]) Wound #2 Left Toe Second o Elevate legs to the level of the heart and pump ankles as often as possible Wound #3 Right,Posterior Lower Leg o Elevate legs to the level of the heart and pump ankles as often as possible Wound #4 Left Calcaneus o Elevate legs to the level of the heart and pump ankles as often as possible Off-Loading Wound #1 Left,Lateral Lower Leg o Mattress - wound care center to order hospital bed, mattress o Turn and reposition every 2 hours o  Other: - float heels while lying in bed Wound #2 Left Toe Second o Mattress - wound care center to order hospital bed, mattress o Turn and reposition every 2 hours o Other: - float heels while lying in bed Wound #3 Right,Posterior Lower Leg o Mattress - wound care center to order hospital bed, mattress o Turn and reposition every 2 hours o Other: - float heels while lying in bed Wound #4 Left Calcaneus o Mattress - wound care center to order hospital bed, mattress o Turn and reposition every 2 hours o Other: - float heels while lying in bed Wound #5 Coccyx o Mattress - wound care center to order hospital bed, mattress o Turn and reposition every 2 hours o Other: - float heels while lying in bed Additional Orders / Instructions Wound #1 Left,Lateral Lower Leg o Increase protein intake. Wound #2 Left Toe Second o Increase protein intake. Wound #3 Right,Posterior Lower Leg o Increase protein intake. Wound #4 Left Calcaneus o Increase protein intake. Wound #5 Coccyx o Increase protein intake. Home  Health Wound #1 [place] Nurse may visit PRN to address patientos wound care needs. Yeakle, Karcyn ([id number]) o FACE TO FACE ENCOUNTER: MEDICARE and MEDICAID PATIENTS: I certify that this patient is under my care and that I had a face-to-face encounter that meets the physician face-to-face encounter requirements with this patient on this date. The encounter with the patient was in whole or in part for the following MEDICAL CONDITION: (primary reason for [place]) MEDICAL NECESSITY: I certify, that based on my findings, NURSING services are a medically necessary home health service. HOME BOUND STATUS: I certify that my clinical findings support that this patient is homebound (i.e., Due to illness or injury, pt requires aid of supportive devices such as crutches, cane, wheelchairs,  walkers, the use of special transportation or the assistance of another person to leave their place of residence. There is a normal inability to leave the home and doing so requires considerable and taxing effort. Other absences are for medical reasons / religious services and are infrequent or of short duration when for other reasons). o If current dressing causes regression in wound condition, may D/C ordered dressing product/s and apply Normal Saline Moist Dressing daily until next [place] / Other MD appointment. [place] of regression in wound condition at [phone]. o Please direct any NON-WOUND related issues/requests for orders to patient's Primary Care Physician Wound #2 Left Toe Second o [place] Nurse may visit PRN to address patientos wound care needs. o FACE TO FACE ENCOUNTER: MEDICARE and MEDICAID PATIENTS: I certify that this patient is under my care and that I had a face-to-face encounter that meets the physician face-to-face encounter requirements with this patient on this date. The encounter with the patient was in whole or in part for the following MEDICAL CONDITION: (primary reason for [place]) MEDICAL NECESSITY: I certify, that based on my findings, NURSING services are a medically necessary home health service. HOME BOUND STATUS: I certify that my clinical findings support that this patient is homebound (i.e., Due to illness or injury, pt requires aid of supportive devices such as crutches, cane, wheelchairs, walkers, the use of special transportation or the assistance of another person to leave their place of residence. There is a normal inability to leave the home and doing so requires considerable and taxing effort. Other absences are for medical reasons / religious services and are infrequent or of short duration when for other reasons). o If current dressing causes regression in  wound condition, may D/C ordered dressing product/s and apply Normal Saline Moist Dressing daily until next [place] / Other MD appointment. [place] of regression in wound condition at [phone]. o Please direct any NON-WOUND related issues/requests for orders to patient's Primary Care Physician Wound #3 [place] Nurse may visit PRN to address patientos wound care needs. o FACE TO FACE ENCOUNTER: MEDICARE and MEDICAID PATIENTS: I certify that this patient is under my care and that I had a face-to-face encounter that meets the physician face-to-face encounter requirements with this patient on this date. The encounter with the patient was in whole or in part for the following MEDICAL CONDITION: (primary reason for [place]) MEDICAL NECESSITY: I certify, that based on my findings, NURSING services are a medically necessary home health service. HOME BOUND STATUS: I certify that my clinical findings support  that this patient is homebound (i.e., Due to illness or injury, pt requires aid of supportive devices such as crutches, cane, wheelchairs, walkers, the use of special transportation or the assistance of another person to leave their place of residence. There is a normal inability to leave the home and doing so requires considerable and taxing effort. Other absences are for medical reasons / religious services and are infrequent or of short duration when for other reasons). o If current dressing causes regression in wound condition, may D/C ordered dressing product/s and apply Normal Saline Moist Dressing daily until next [place] / Other MD appointment. [place] of regression in wound condition at [phone]. o Please direct any NON-WOUND related issues/requests for orders to patient's Primary Care Physician Wound #4 Left [place] Nurse may visit PRN to address patientos wound care needs. o FACE TO FACE ENCOUNTER: MEDICARE and MEDICAID PATIENTS: I certify that this patient is under my care and that I had a face-to-face encounter that meets the physician face-to-face encounter requirements with this patient on this date. The encounter with the patient was in whole or in part for the following MEDICAL CONDITION: (primary reason for [place]) MEDICAL NECESSITY: I certify, that based on my findings, NURSING services are a medically necessary home health service. HOME BOUND STATUS: I certify that my [place], Shanikka ([id number]) clinical findings support that this patient is homebound (i.e., Due to illness or injury, pt requires aid of supportive devices such as crutches, cane, wheelchairs, walkers, the use of special transportation or the assistance of another person to leave their place of residence. There is a normal inability to leave the home and doing so requires considerable and taxing effort. Other absences are for medical reasons / religious services and are infrequent or of short duration when for other reasons). o If current dressing causes regression in wound condition, may D/C ordered dressing product/s and apply Normal Saline Moist Dressing daily until next [place] / Other MD appointment. [place] of regression in wound condition at [phone]. o Please direct any NON-WOUND related issues/requests for orders to patient's Primary Care Physician Wound #5 [place] Visits o Home Health Nurse may visit PRN to address patientos wound care needs. o FACE TO FACE ENCOUNTER: MEDICARE and MEDICAID PATIENTS: I certify that this patient is under my care and that I had a face-to-face encounter that meets the physician face-to-face encounter requirements with this patient on this date. The encounter with the patient was in  whole or in part for the following MEDICAL CONDITION: (primary reason for [place]) MEDICAL NECESSITY: I certify, that based on my findings, NURSING services are a medically necessary home health service. HOME BOUND STATUS: I certify that my clinical findings support that this patient is homebound (i.e., Due to illness or injury, pt requires aid of supportive devices such as crutches, cane, wheelchairs, walkers, the use of special transportation or the assistance of another person to leave their place of residence. There is a normal inability to leave the home and doing so requires considerable and taxing effort. Other absences are for medical reasons / religious services and are infrequent or of short duration when for other reasons). o If current dressing causes regression in wound condition, may D/C ordered dressing product/s and apply Normal Saline Moist Dressing daily until next [place] / Other MD appointment. [place] of regression in  wound condition at [phone]. o Please direct any NON-WOUND related issues/requests for orders to patient's Primary Care Physician Patient Medications Allergies: No Known Allergies Notifications Medication Indication Start End lidocaine DOSE 1 - topical 4 % cream - 1 cream topical Santyl 01/02/2018 DOSE topical 250 unit/gram ointment - ointment topical; apply to all wounds Electronic Signature(s) Signed: 01/01/2018 3:26:52 PM By: Lawanda Cousins Entered By: Lawanda Cousins on 01/01/2018 15:26:50 Mitchelle, Oliviarose ([id number]) -------------------------------------------------------------------------------- Prescription 01/01/2018 Patient Name: Katherine Owen Provider: Lawanda Cousins NP Date of Birth: 21-Apr-1928 NPI#: [id number] Sex: F DEA#: [id number] Phone #: [phone] License #: Patient Address: [place], [place]  [address] [address], [place], [place] [address] [phone] Allergies No Known Allergies Medication Medication: Route: Strength: Form: lidocaine 4 % topical cream topical 4% cream Class: TOPICAL LOCAL ANESTHETICS Dose: Frequency / Time: Indication: 1 1 cream topical Number of Refills: Number of Units: 0 Generic Substitution: Start Date: End Date: One Time Use: Substitution Permitted No Note to Pharmacy: Signature(s): Date(s): Electronic Signature(s) Signed: 01/01/2018 9:10:59 PM By: Lawanda Cousins Entered By: Lawanda Cousins on 01/01/2018 15:26:53 Mccaughey, Tana ([id number]) --------------------------------------------------------------------------------  Problem List Details Patient Name: Katherine Owen Date of Service: 01/01/2018 1:15 PM Medical Record Number: [id number] Patient Account Number: [id number] Date of Birth/Sex: 02/21/1928 (84 y.o. F) Treating RN: Ahmed Prima Primary Care Provider: Carollee Herter, Kendrick Fries Other Clinician: Referring Provider: Carollee Herter, Kendrick Fries Treating Provider/Extender: Cathie Olden in Treatment: 2 Active Problems ICD-10 Evaluated Encounter Code Description Active Date Today Diagnosis I73.9 Peripheral vascular disease, unspecified 12/18/2017 No Yes L89.95 Pressure ulcer of unspecified site, unstageable 12/18/2017 No Yes R60.9 Edema, unspecified 12/18/2017 No Yes Z99.3 Dependence on wheelchair 12/18/2017 No Yes L89.153 Pressure ulcer of sacral region, stage 3 01/01/2018 No Yes L89.619 Pressure ulcer of right heel, unspecified stage 01/01/2018 No Yes Inactive Problems Resolved Problems Electronic Signature(s) Signed: 01/01/2018 4:53:03 PM By: Lawanda Cousins Entered By: Lawanda Cousins on 01/01/2018 [place], [place] ([id number]) -------------------------------------------------------------------------------- Progress Note Details Patient Name: Katherine Owen Date of Service: 01/01/2018 1:15 PM Medical Record  Number: [id number] Patient Account Number: [id number] Date of Birth/Sex: Oct 10, 1927 (84 y.o. F) Treating RN: Ahmed Prima Primary Care Provider: Roma Schanz Other Clinician: Referring Provider: Roma Schanz Treating Provider/Extender: Cathie Olden in Treatment: 2 Subjective Chief Complaint Information obtained from Patient multiple ulcers History of Present Illness (HPI) 12/18/17-She is here for initial evaluation of a left posterior calf pressure ulcer, she suspects from the foot rest of her wheelchair. She is wheelchair/bed bound. She says it has been present for approximately 1 month, beginning as a blister. They have been using silvadene topically. She did have an arterial study today but the results are not available for review. She denies pain at rest, admits to pain with pressure and manipulation. She has been wearing Unna boots and tolerating but there are areas of erythema, concerning for pressure to the bilateral anterior ankle and dorsal aspect of the right foot; we will hold off on ace wrap and use ace wraps at this time. She lives with family and has home health. She is non-diabetic and does not smoke. 01/01/18-She is here in follow-up evaluation for multiple ulcers. The left posterior calf pressure ulcer is responding to Santyl and we will continue. The area to the right posterior calf is now eschar (appeared to be hematoma likely last evaluation). She presents with a new deep tissue injury, intact blister, to the right posterior/lateral heel  and also presents with a stage III sacral ulcer. She states to sacral ulcer was present on admission but she deferred evaluation at that time. Extensive education was provided to the patient and the family regarding need for offloading and the complexity regarding multiple areas needing offloading; it was clearly explained that offloading one area of pressure may lead to development of new pressure injury. Referral to  medical modalities for wheelchair cushion and appropriate bed/mattress. Patient History Information obtained from Patient. Family History Cancer - Mother,Maternal Grandparents,Siblings, Stroke - Siblings, No family history of Diabetes, Heart Disease, Hereditary Spherocytosis, Hypertension, Kidney Disease, Lung Disease, Seizures, Thyroid Problems, Tuberculosis. Social History Former smoker - 75 years ago, Marital Status - Widowed, Alcohol Use - Never, Drug Use - No History, Caffeine Use - Moderate. Medical And Surgical History Notes Musculoskeletal nueropathic pain, non amublatory, Kaucher, Miray ([id number]) Objective Constitutional Vitals Time Taken: 1:40 PM, Temperature: 98.3 F, Pulse: 99 bpm, Respiratory Rate: 16 breaths/min, Blood Pressure: 127/69 mmHg. Integumentary (Hair, Skin) Wound #1 status is Open. Original cause of wound was Gradually Appeared. The wound is located on the Left,Lateral Lower Leg. The wound measures 3.6cm length x 1.9cm width x 0.3cm depth; 5.372cm^2 area and 1.612cm^3 volume. There is Fat Layer (Subcutaneous Tissue) Exposed exposed. There is no tunneling or undermining noted. There is a medium amount of serous drainage noted. The wound margin is flat and intact. There is small (1-33%) pink granulation within the wound bed. There is a large (67-100%) amount of necrotic tissue within the wound bed including Eschar and Adherent Slough. The periwound skin appearance did not exhibit: Callus, Crepitus, Excoriation, Induration, Rash, Scarring, Dry/Scaly, Maceration, Atrophie Blanche, Cyanosis, Ecchymosis, Hemosiderin Staining, Mottled, Pallor, Rubor, Erythema. Periwound temperature was noted as No Abnormality. The periwound has tenderness on palpation. Wound #2 status is Open. Original cause of wound was Gradually Appeared. The wound is located on the Left Toe Second. The wound measures 0.1cm length x 0.1cm width x 0.1cm depth; 0.008cm^2 area and 0.001cm^3 volume.  There is Fat Layer (Subcutaneous Tissue) Exposed exposed. There is no tunneling or undermining noted. There is a small amount of serous drainage noted. The wound margin is flat and intact. There is medium (34-66%) pink granulation within the wound bed. There is a medium (34-66%) amount of necrotic tissue within the wound bed including Adherent Slough. The periwound skin appearance did not exhibit: Callus, Crepitus, Excoriation, Induration, Rash, Scarring, Dry/Scaly, Maceration, Atrophie Blanche, Cyanosis, Ecchymosis, Hemosiderin Staining, Mottled, Pallor, Rubor, Erythema. Periwound temperature was noted as No Abnormality. The periwound has tenderness on palpation. Wound #3 status is Open. Original cause of wound was Pressure Injury. The wound is located on the Right,Posterior Lower Leg. The wound measures 1.5cm length x 1.3cm width x 0.2cm depth; 1.532cm^2 area and 0.306cm^3 volume. There is no tunneling or undermining noted. There is a none present amount of drainage noted. The wound margin is flat and intact. There is no granulation within the wound bed. There is a large (67-100%) amount of necrotic tissue within the wound bed including Eschar. Periwound temperature was noted as No Abnormality. The periwound has tenderness on palpation. Wound #4 status is Open. Original cause of wound was Pressure Injury. The wound is located on the Left Calcaneus. The wound measures 4.6cm length x 4.4cm width x 0.1cm depth; 15.896cm^2 area and 1.59cm^3 volume. Wound #5 status is Open. Original cause of wound was Pressure Injury. The wound is located on the Coccyx. The wound measures 2.5cm length x 2.5cm width x 0.1cm depth;  4.909cm^2 area and 0.491cm^3 volume. Assessment Active Problems ICD-10 Peripheral vascular disease, unspecified Pressure ulcer of unspecified site, unstageable Edema, unspecified Dependence on wheelchair Pressure ulcer of sacral region, stage 3 Pressure ulcer of right heel, unspecified  stage Stickle, Kai ([id number]) Procedures Wound #1 Pre-procedure diagnosis of Wound #1 is a Venous Leg Ulcer located on the Left,Lateral Lower Leg .Severity of Tissue Pre Debridement is: Fat layer exposed. There was a Excisional Skin/Subcutaneous Tissue Debridement with a total area of 6.84 sq cm performed by Lawanda Cousins, NP. With the following instrument(s): Curette to remove Viable and Non-Viable tissue/material. Material removed includes Subcutaneous Tissue, Slough, and Fibrin/Exudate after achieving pain control using Lidocaine 4% Topical Solution. No specimens were taken. A time out was conducted at 14:11, prior to the start of the procedure. A Minimum amount of bleeding was controlled with Pressure. The procedure was tolerated well with a pain level of 0 throughout and a pain level of 0 following the procedure. Patient s Level of Consciousness post procedure was recorded as Awake and Alert. Post Debridement Measurements: 3.6cm length x 1.9cm width x 0.4cm depth; 2.149cm^3 volume. Character of Wound/Ulcer Post Debridement requires further debridement. Severity of Tissue Post Debridement is: Fat layer exposed. Post procedure Diagnosis Wound #1: Same as Pre-Procedure Wound #3 Pre-procedure diagnosis of Wound #3 is a Pressure Ulcer located on the Right,Posterior Lower Leg . There was a Excisional Skin/Subcutaneous Tissue Debridement with a total area of 1.95 sq cm performed by Lawanda Cousins, NP. With the following instrument(s): Curette to remove Viable and Non-Viable tissue/material. Material removed includes Subcutaneous Tissue, Slough, and Fibrin/Exudate after achieving pain control using Lidocaine 4% Topical Solution. No specimens were taken. A time out was conducted at 14:11, prior to the start of the procedure. A Minimum amount of bleeding was controlled with Pressure. The procedure was tolerated well with a pain level of 0 throughout and a pain level of 0 following the  procedure. Patient s Level of Consciousness post procedure was recorded as Awake and Alert. Post Debridement Measurements: 1.5cm length x 1.3cm width x 0.3cm depth; 0.459cm^3 volume. Post debridement Stage noted as Category/Stage II. Character of Wound/Ulcer Post Debridement requires further debridement. Post procedure Diagnosis Wound #3: Same as Pre-Procedure Wound #5 Pre-procedure diagnosis of Wound #5 is a Pressure Ulcer located on the Coccyx . There was a Excisional Skin/Subcutaneous Tissue Debridement with a total area of 6.25 sq cm performed by Lawanda Cousins, NP. With the following instrument(s): Curette to remove Viable and Non-Viable tissue/material. Material removed includes Subcutaneous Tissue, Slough, and Fibrin/Exudate after achieving pain control using Lidocaine 4% Topical Solution. No specimens were taken. A time out was conducted at 14:11, prior to the start of the procedure. A Minimum amount of bleeding was controlled with Pressure. The procedure was tolerated well with a pain level of 0 throughout and a pain level of 0 following the procedure. Patient s Level of Consciousness post procedure was recorded as Awake and Alert. Post Debridement Measurements: 2.5cm length x 2.5cm width x 0.2cm depth; 0.982cm^3 volume. Post debridement Stage noted as Category/Stage III. Character of Wound/Ulcer Post Debridement requires further debridement. Post procedure Diagnosis Wound #5: Same as Pre-Procedure Plan Wound Cleansing: Wound #1 Left,Lateral Lower Leg: Clean wound with Normal Saline. Cleanse wound with mild soap and water Wound #2 Left Toe Second: Clean wound with Normal Saline. Cleanse wound with mild soap and water Wound #3 Right,Posterior Lower Leg: Clean wound with Normal Saline. Cleanse wound with mild soap and water Gitto, Shirlee ([id number]) Wound #  5 Coccyx: Clean wound with Normal Saline. Cleanse wound with mild soap and water Anesthetic (add to Medication  List): Wound #1 Left,Lateral Lower Leg: Topical Lidocaine 4% cream applied to wound bed prior to debridement (In Clinic Only). Wound #2 Left Toe Second: Topical Lidocaine 4% cream applied to wound bed prior to debridement (In Clinic Only). Wound #3 Right,Posterior Lower Leg: Topical Lidocaine 4% cream applied to wound bed prior to debridement (In Clinic Only). Wound #5 Coccyx: Topical Lidocaine 4% cream applied to wound bed prior to debridement (In Clinic Only). Primary Wound Dressing: Wound #1 Left,Lateral Lower Leg: Saline moistened gauze Santyl Ointment Wound #2 Left Toe Second: Hydrafera Blue Ready Transfer Wound #3 Right,Posterior Lower Leg: Saline moistened gauze Santyl Ointment Wound #4 Left Calcaneus: Other: - betadine paint Wound #5 Coccyx: Saline moistened gauze Santyl Ointment Secondary Dressing: Wound #1 Left,Lateral Lower Leg: Dry Gauze [place] Wound #2 Left Toe Second: Dry Gauze Conform/[place] Wound #3 Right,Posterior Lower Leg: Dry Gauze [place] Wound #5 Coccyx: Boardered Foam Dressing Dressing Change Frequency: Wound #1 Left,Lateral Lower Leg: Change dressing every day. Wound #2 Left Toe Second: Change dressing every other day. Wound #3 Right,Posterior Lower Leg: Change dressing every other day. Wound #4 Left Calcaneus: Change dressing every other day. Wound #5 Coccyx: Change dressing every other day. Follow-up Appointments: Wound #1 Left,Lateral Lower Leg: Return Appointment in 2 weeks. Wound #2 Left Toe Second: Return Appointment in 2 weeks. Wound #3 Right,Posterior Lower Leg: Return Appointment in 2 weeks. Wound #4 Left Calcaneus: Return Appointment in 2 weeks. Wound #5 Coccyx: Return Appointment in 2 weeks. Tamburri, Hyacinth ([id number]) Edema Control: Wound #1 Left,Lateral Lower Leg: Elevate legs to the level of the heart and pump ankles as often as possible Wound #2 Left Toe Second: Elevate legs to the level of the heart and  pump ankles as often as possible Wound #3 Right,Posterior Lower Leg: Elevate legs to the level of the heart and pump ankles as often as possible Wound #4 Left Calcaneus: Elevate legs to the level of the heart and pump ankles as often as possible Off-Loading: Wound #1 Left,Lateral Lower Leg: Mattress - wound care center to order hospital bed, mattress Turn and reposition every 2 hours Other: - float heels while lying in bed Wound #2 Left Toe Second: Mattress - wound care center to order hospital bed, mattress Turn and reposition every 2 hours Other: - float heels while lying in bed Wound #3 Right,Posterior Lower Leg: Mattress - wound care center to order hospital bed, mattress Turn and reposition every 2 hours Other: - float heels while lying in bed Wound #4 Left Calcaneus: Mattress - wound care center to order hospital bed, mattress Turn and reposition every 2 hours Other: - float heels while lying in bed Wound #5 Coccyx: Mattress - wound care center to order hospital bed, mattress Turn and reposition every 2 hours Other: - float heels while lying in bed Additional Orders / Instructions: Wound #1 Left,Lateral Lower Leg: Increase protein intake. Wound #2 Left Toe Second: Increase protein intake. Wound #3 Right,Posterior Lower Leg: Increase protein intake. Wound #4 Left Calcaneus: Increase protein intake. Wound #5 Coccyx: Increase protein intake. Home Health: Wound #1 Left,Lateral Lower Leg: [place] Nurse may visit PRN to address patient s wound care needs. FACE TO FACE ENCOUNTER: MEDICARE and MEDICAID PATIENTS: I certify that this patient is under my care and that I had a face-to-face encounter that meets the physician face-to-face encounter requirements with this  patient on this date. The encounter with the patient was in whole or in part for the following MEDICAL CONDITION: (primary reason for [place]) MEDICAL NECESSITY: I certify,  that based on my findings, NURSING services are a medically necessary home health service. HOME BOUND STATUS: I certify that my clinical findings support that this patient is homebound (i.e., Due to illness or injury, pt requires aid of supportive devices such as crutches, cane, wheelchairs, walkers, the use of special transportation or the assistance of another person to leave their place of residence. There is a normal inability to leave the home and doing so requires considerable and taxing effort. Other absences are for medical reasons / religious services and are infrequent or of short duration when for other reasons). If current dressing causes regression in wound condition, may D/C ordered dressing product/s and apply Normal Saline Moist Dressing daily until next [place] / Other MD appointment. [place] of regression in wound condition at [phone]. Please direct any NON-WOUND related issues/requests for orders to patient's Primary Care Physician Wound #2 Left Toe Second: [place] Visits TAMMA, BRIGANDI ([id number]) [place] Nurse may visit PRN to address patient s wound care needs. FACE TO FACE ENCOUNTER: MEDICARE and MEDICAID PATIENTS: I certify that this patient is under my care and that I had a face-to-face encounter that meets the physician face-to-face encounter requirements with this patient on this date. The encounter with the patient was in whole or in part for the following MEDICAL CONDITION: (primary reason for [place]) MEDICAL NECESSITY: I certify, that based on my findings, NURSING services are a medically necessary home health service. HOME BOUND STATUS: I certify that my clinical findings support that this patient is homebound (i.e., Due to illness or injury, pt requires aid of supportive devices such as crutches, cane, wheelchairs, walkers, the use of special transportation or the assistance of another person to  leave their place of residence. There is a normal inability to leave the home and doing so requires considerable and taxing effort. Other absences are for medical reasons / religious services and are infrequent or of short duration when for other reasons). If current dressing causes regression in wound condition, may D/C ordered dressing product/s and apply Normal Saline Moist Dressing daily until next [place] / Other MD appointment. [place] of regression in wound condition at [phone]. Please direct any NON-WOUND related issues/requests for orders to patient's Primary Care Physician Wound #3 Right,Posterior Lower Leg: [place] Nurse may visit PRN to address patient s wound care needs. FACE TO FACE ENCOUNTER: MEDICARE and MEDICAID PATIENTS: I certify that this patient is under my care and that I had a face-to-face encounter that meets the physician face-to-face encounter requirements with this patient on this date. The encounter with the patient was in whole or in part for the following MEDICAL CONDITION: (primary reason for [place]) MEDICAL NECESSITY: I certify, that based on my findings, NURSING services are a medically necessary home health service. HOME BOUND STATUS: I certify that my clinical findings support that this patient is homebound (i.e., Due to illness or injury, pt requires aid of supportive devices such as crutches, cane, wheelchairs, walkers, the use of special transportation or the assistance of another person to leave their place of residence. There is a normal inability to leave the home and doing so requires considerable and taxing effort. Other absences are for medical reasons / religious services  and are infrequent or of short duration when for other reasons). If current dressing causes regression in wound condition, may D/C ordered dressing product/s and apply Normal Saline Moist Dressing daily  until next [place] / Other MD appointment. [place] of regression in wound condition at [phone]. Please direct any NON-WOUND related issues/requests for orders to patient's Primary Care Physician Wound #4 Left Calcaneus: [place] Nurse may visit PRN to address patient s wound care needs. FACE TO FACE ENCOUNTER: MEDICARE and MEDICAID PATIENTS: I certify that this patient is under my care and that I had a face-to-face encounter that meets the physician face-to-face encounter requirements with this patient on this date. The encounter with the patient was in whole or in part for the following MEDICAL CONDITION: (primary reason for [place]) MEDICAL NECESSITY: I certify, that based on my findings, NURSING services are a medically necessary home health service. HOME BOUND STATUS: I certify that my clinical findings support that this patient is homebound (i.e., Due to illness or injury, pt requires aid of supportive devices such as crutches, cane, wheelchairs, walkers, the use of special transportation or the assistance of another person to leave their place of residence. There is a normal inability to leave the home and doing so requires considerable and taxing effort. Other absences are for medical reasons / religious services and are infrequent or of short duration when for other reasons). If current dressing causes regression in wound condition, may D/C ordered dressing product/s and apply Normal Saline Moist Dressing daily until next [place] / Other MD appointment. [place] of regression in wound condition at [phone]. Please direct any NON-WOUND related issues/requests for orders to patient's Primary Care Physician Wound #5 Coccyx: [place] Nurse may visit PRN to address patient s wound care needs. FACE TO FACE ENCOUNTER: MEDICARE and MEDICAID PATIENTS: I  certify that this patient is under my care and that I had a face-to-face encounter that meets the physician face-to-face encounter requirements with this patient on this date. The encounter with the patient was in whole or in part for the following MEDICAL CONDITION: (primary reason for [place]) MEDICAL NECESSITY: I certify, that based on my findings, NURSING services are a medically necessary home health service. HOME BOUND STATUS: I certify that my clinical findings support that this patient is homebound (i.e., Due to illness or injury, pt requires aid of supportive devices such as crutches, cane, wheelchairs, walkers, the use of special transportation or the assistance of another person to leave their place of residence. There is a normal inability to leave the home and doing so requires considerable and taxing effort. Other absences are for medical reasons / religious services and are infrequent or of short duration when for other reasons). If current dressing causes regression in wound condition, may D/C ordered dressing product/s and apply Normal Saline Moist Dressing daily until next [place] / Other MD appointment. [place] of regression in [facility] [place] ([id number]) wound condition at [phone]. Please direct any NON-WOUND related issues/requests for orders to patient's Primary Care Physician The following medication(s) was prescribed: lidocaine topical 4 % cream 1 1 cream topical was prescribed at facility Santyl topical 250 unit/gram ointment ointment topical; apply to all wounds starting 01/02/2018 Electronic Signature(s) Signed: 01/01/2018 4:57:46 PM By: Lawanda Cousins Entered By: Lawanda Cousins on 01/01/2018 16:57:46 Beavin, [name] ([id number]) -------------------------------------------------------------------------------- ROS/PFSH Details Patient Name: Katherine Owen Date  of Service: 01/01/2018 1:15 PM Medical Record Number:  [id number] Patient Account Number: [id number] Date of Birth/Sex: [birth year] (84 y.o. F) Treating RN: Ahmed Prima Primary Care Provider: Carollee Herter, Kendrick Fries Other Clinician: Referring Provider: Carollee Herter, Kendrick Fries Treating Provider/Extender: Cathie Olden in Treatment: 2 Information Obtained From Patient Wound History Do you currently have one or more open woundso Yes How many open wounds do you currently haveo 2 Approximately how long have you had your woundso 2 or 3 months How have you been treating your wound(s) until nowo unna boots Has your wound(s) ever healed and then re-openedo No Have you had any lab work done in the past montho No Have you tested positive for an antibiotic resistant organism (MRSA, VRE)o No Have you tested positive for osteomyelitis (bone infection)o No Have you had any tests for circulation on your legso Yes Where was the test doneo [facility] Eyes Medical History: Positive for: Cataracts Negative for: Glaucoma; Optic Neuritis Ear/Nose/Mouth/Throat Medical History: Negative for: Chronic sinus problems/congestion; Middle ear problems Hematologic/Lymphatic Medical History: Negative for: Anemia; Hemophilia; Human Immunodeficiency Virus; Lymphedema; Sickle Cell Disease Respiratory Medical History: Negative for: Aspiration; Asthma; Chronic Obstructive Pulmonary Disease (COPD); Pneumothorax; Sleep Apnea; Tuberculosis Cardiovascular Medical History: Positive for: Hypertension Negative for: Angina; Arrhythmia; Congestive Heart Failure; Coronary Artery Disease; Deep Vein Thrombosis; Hypotension; Myocardial Infarction; Peripheral Arterial Disease; Peripheral Venous Disease; Phlebitis; Vasculitis Gastrointestinal Medical History: Negative for: Cirrhosis ; Colitis; Crohnos; Hepatitis A; Hepatitis B; Hepatitis C Endocrine Bazinet, Jakai ([id number]) Medical History: Negative for: Type I Diabetes; Type II Diabetes Genitourinary Medical History: Negative  for: End Stage Renal Disease Immunological Medical History: Negative for: Lupus Erythematosus; Raynaudos; Scleroderma Integumentary (Skin) Medical History: Negative for: History of Burn; History of pressure wounds Musculoskeletal Medical History: Positive for: Osteoarthritis Negative for: Gout; Rheumatoid Arthritis; Osteomyelitis Past Medical History Notes: nueropathic pain, non amublatory, Neurologic Medical History: Positive for: Neuropathy Negative for: Dementia; Quadriplegia; Paraplegia; Seizure Disorder Oncologic Medical History: Negative for: Received Chemotherapy; Received Radiation Psychiatric Medical History: Negative for: Anorexia/bulimia; Confinement Anxiety HBO Extended History Items Eyes: Cataracts Immunizations Pneumococcal Vaccine: Received Pneumococcal Vaccination: Yes Immunization Notes: up to date Implantable Devices Family and Social History Cancer: Yes - Mother,Maternal Grandparents,Siblings; Diabetes: No; Heart Disease: No; Hereditary Spherocytosis: No; Hypertension: No; Kidney Disease: No; Lung Disease: No; Seizures: No; Stroke: Yes - Siblings; Thyroid Problems: No; Tuberculosis: No; Former smoker - 41 years ago; Marital Status - Widowed; Alcohol Use: Never; Drug Use: No History; Sylvestre, Kevia ([id number]) Caffeine Use: Moderate; Financial Concerns: No; Food, Clothing or Shelter Needs: No; Support System Lacking: No; Transportation Concerns: No; Advanced Directives: No; Patient does not want information on Advanced Directives Physician Affirmation I have reviewed and agree with the above information. Electronic Signature(s) Signed: 01/01/2018 9:10:59 PM By: Lawanda Cousins Signed: 01/02/2018 11:31:29 AM By: Alric Quan Entered By: Lawanda Cousins on 01/01/2018 16:57:36 Basu, Shaye ([id number]) -------------------------------------------------------------------------------- SuperBill Details Patient Name: Katherine Owen Date of Service:  01/01/2018 Medical Record Number: [id number] Patient Account Number: [id number] Date of Birth/Sex: 1927-11-16 (84 y.o. F) Treating RN: Ahmed Prima Primary Care Provider: Carollee Herter, Kendrick Fries Other Clinician: Referring Provider: Carollee Herter, Kendrick Fries Treating Provider/Extender: Cathie Olden in Treatment: 2 Diagnosis Coding ICD-10 Codes Code Description I73.9 Peripheral vascular disease, unspecified L89.95 Pressure ulcer of unspecified site, unstageable R60.9 Edema, unspecified Z99.3 Dependence on wheelchair L89.153 Pressure ulcer of sacral region, stage 3 L89.619 Pressure ulcer of right heel, unspecified stage Facility Procedures CPT4 Code: [id number] Description: [place] TISSUE 20 SQ CM/< ICD-10 Diagnosis Description L89.95  Pressure ulcer of unspecified site, unstageable Modifier: Quantity: 1 Physician Procedures CPT4 Code: [id number] Description: 20254 - WC PHYS LEVEL 3 - EST PT ICD-10 Diagnosis Description L89.153 Pressure ulcer of sacral region, stage 3 L89.619 Pressure ulcer of right heel, unspecified stage Modifier: Quantity: 1 CPT4 Code: [id number] Description: 62831 - WC PHYS SUBQ TISS 20 SQ CM ICD-10 Diagnosis Description L89.95 Pressure ulcer of unspecified site, unstageable Modifier: Quantity: 1 Electronic Signature(s) Signed: 01/01/2018 4:58:27 PM By: Lawanda Cousins Entered By: Lawanda Cousins on 01/01/2018 16:58:27

## 2018-01-03 NOTE — Progress Notes (Signed)
GINNIFER, CREELMAN ([id number]) Visit Report for 01/01/2018 Arrival Information Details Patient Name: JAMICE, CARRENO Date of Service: 01/01/2018 1:15 PM Medical Record Number: [id number] Patient Account Number: [id number] Date of Birth/Sex: 1928/02/19 (84 y.o. F) Treating RN: Secundino Ginger Primary Care Patrik Turnbaugh: Carollee Herter, Kendrick Fries Other Clinician: Referring Connie Lasater: Carollee Herter, Kendrick Fries Treating Rocklin Soderquist/Extender: Cathie Olden in Treatment: 2 Visit Information History Since Last Visit Added or deleted any medications: No Patient Arrived: Wheel Chair Any new allergies or adverse reactions: No Arrival Time: 13:40 Had a fall or experienced change in No Accompanied By: son activities of daily living that may affect Transfer Assistance: Harrel Lemon Lift risk of falls: Patient Identification Verified: Yes Signs or symptoms of abuse/neglect since last visito No Secondary Verification Process Completed: Yes Hospitalized since last visit: No Implantable device outside of the clinic excluding No cellular tissue based products placed in the center since last visit: Pain Present Now: No Electronic Signature(s) Signed: 01/01/2018 3:51:06 PM By: Secundino Ginger Entered By: Secundino Ginger on 01/01/2018 13:41:06 Isabell, Jesseca ([id number]) -------------------------------------------------------------------------------- Encounter Discharge Information Details Patient Name: [name] Date of Service: 01/01/2018 1:15 PM Medical Record Number: [id number] Patient Account Number: [id number] Date of Birth/Sex: 03/02/1928 (84 y.o. F) Treating RN: Roger Shelter Primary Care Makinzee Durley: Carollee Herter, Kendrick Fries Other Clinician: Referring Ethan Kasperski: Carollee Herter, Kendrick Fries Treating Aileana Hodder/Extender: Cathie Olden in Treatment: 2 Encounter Discharge Information Items Discharge Condition: Stable Ambulatory Status: Wheelchair Discharge Destination: Home Transportation: Private Auto Accompanied By: son Schedule  Follow-up Appointment: Yes Clinical Summary of Care: Electronic Signature(s) Signed: 01/02/2018 10:58:55 AM By: Roger Shelter Entered By: Roger Shelter on 01/01/2018 14:52:26 Fleet, Joreen ([id number]) -------------------------------------------------------------------------------- Multi Wound Chart Details Patient Name: [name] Date of Service: 01/01/2018 1:15 PM Medical Record Number: [id number] Patient Account Number: [id number] Date of Birth/Sex: 27-Mar-1928 (84 y.o. F) Treating RN: Secundino Ginger Primary Care Marty Uy: Carollee Herter, Kendrick Fries Other Clinician: Referring Mollyann Halbert: Carollee Herter, Kendrick Fries Treating De Libman/Extender: Cathie Olden in Treatment: 2 Vital Signs Height(in): Pulse(bpm): 99 Weight(lbs): Blood Pressure(mmHg): 127/69 Body Mass Index(BMI): Temperature(F): 98.3 Respiratory Rate 16 (breaths/min): Photos: [1:No Photos] [2:No Photos] [3:No Photos] Wound Location: [1:Left Lower Leg - Lateral] [2:Left Toe Second] [3:Right Lower Leg - Posterior] Wounding Event: [1:Gradually Appeared] [2:Gradually Appeared] [3:Pressure Injury] Primary Etiology: [1:Venous Leg Ulcer] [2:To be determined] [3:Pressure Ulcer] Comorbid History: [1:Cataracts, Hypertension, Osteoarthritis, Neuropathy] [2:Cataracts, Hypertension, Osteoarthritis, Neuropathy] [3:Cataracts, Hypertension, Osteoarthritis, Neuropathy] Date Acquired: [1:11/17/2017] [2:09/15/2017] [3:12/18/2017] Weeks of Treatment: [1:2] [2:2] [3:2] Wound Status: [1:Open] [2:Open] [3:Open] Measurements L x W x D [1:3.6x1.9x0.3] [2:0.1x0.1x0.1] [3:1.5x1.3x0.2] (cm) Area (cm) : [1:5.372] [2:0.008] [3:1.532] Volume (cm) : [1:1.612] [2:0.001] [3:0.306] % Reduction in Area: [1:-38.70%] [2:0.00%] [3:-387.90%] % Reduction in Volume: [1:-316.50%] [2:0.00%] [3:-887.10%] Classification: [1:Full Thickness Without Exposed Support Structures] [2:Full Thickness Without Exposed Support Structures] [3:Category/Stage II] Exudate  Amount: [1:Medium] [2:Small] [3:None Present] Exudate Type: [1:Serous] [2:Serous] [3:N/A] Exudate Color: [1:amber] [2:amber] [3:N/A] Wound Margin: [1:Flat and Intact] [2:Flat and Intact] [3:Flat and Intact] Granulation Amount: [1:Small (1-33%)] [2:Medium (34-66%)] [3:None Present (0%)] Granulation Quality: [1:Pink] [2:Pink] [3:N/A] Necrotic Amount: [1:Large (67-100%)] [2:Medium (34-66%)] [3:Large (67-100%)] Necrotic Tissue: [1:Eschar, Adherent Slough] [2:Adherent Slough] [3:Eschar] Exposed Structures: [1:Fat Layer (Subcutaneous Tissue) Exposed: Yes Fascia: No Tendon: No Muscle: No Joint: No Bone: No] [2:Fat Layer (Subcutaneous Tissue) Exposed: Yes Fascia: No Tendon: No Muscle: No Joint: No Bone: No] [3:Fascia: No Fat Layer (Subcutaneous Tissue)  Exposed: No Tendon: No Muscle: No Joint: No Bone: No] Epithelialization: [1:None] [2:None] [3:None] Periwound Skin Texture: [1:Excoriation: No Induration: No Callus: No Crepitus: No] [2:Excoriation: No Induration: No  Callus: No Crepitus: No] [3:No Abnormalities Noted] Rash: No Rash: No Scarring: No Scarring: No Periwound Skin Moisture: Maceration: No Maceration: No No Abnormalities Noted Dry/Scaly: No Dry/Scaly: No Periwound Skin Color: Atrophie Blanche: No Atrophie Blanche: No No Abnormalities Noted Cyanosis: No Cyanosis: No Ecchymosis: No Ecchymosis: No Erythema: No Erythema: No Hemosiderin Staining: No Hemosiderin Staining: No Mottled: No Mottled: No Pallor: No Pallor: No Rubor: No Rubor: No Temperature: No Abnormality No Abnormality No Abnormality Tenderness on Palpation: Yes Yes Yes Wound Preparation: Ulcer Cleansing: Ulcer Cleansing: Ulcer Cleansing: Rinsed/Irrigated with Saline Rinsed/Irrigated with Saline Rinsed/Irrigated with Saline Topical Anesthetic Applied: Topical Anesthetic Applied: Topical Anesthetic Applied: Other: lidocaine 4% Other: lidocaine 4% None Wound Number: 4 5 N/A Photos: No Photos No Photos  N/A Wound Location: Left Calcaneus Coccyx N/A Wounding Event: Pressure Injury Pressure Injury N/A Primary Etiology: Pressure Ulcer Pressure Ulcer N/A Comorbid History: N/A N/A N/A Date Acquired: 12/28/2017 12/18/2017 N/A Weeks of Treatment: 0 0 N/A Wound Status: Open Open N/A Measurements L x W x D 4.6x4.4x0.1 2.5x2.5x0.1 N/A (cm) Area (cm) : 15.896 4.909 N/A Volume (cm) : 1.59 0.491 N/A % Reduction in Area: N/A N/A N/A % Reduction in Volume: N/A N/A N/A Classification: N/A N/A N/A Exudate Amount: N/A N/A N/A Exudate Type: N/A N/A N/A Exudate Color: N/A N/A N/A Wound Margin: N/A N/A N/A Granulation Amount: N/A N/A N/A Granulation Quality: N/A N/A N/A Necrotic Amount: N/A N/A N/A Necrotic Tissue: N/A N/A N/A Exposed Structures: N/A N/A N/A Epithelialization: N/A N/A N/A Periwound Skin Texture: No Abnormalities Noted No Abnormalities Noted N/A Periwound Skin Moisture: No Abnormalities Noted No Abnormalities Noted N/A Periwound Skin Color: No Abnormalities Noted No Abnormalities Noted N/A Temperature: N/A N/A N/A Tenderness on Palpation: No No N/A Wound Preparation: N/A N/A N/A Treatment Notes Electronic Signature(s) SHUNTAE, HERZIG ([id number]) Signed: 01/01/2018 3:51:06 PM By: Secundino Ginger Entered By: Secundino Ginger on 01/01/2018 14:09:19 Colquitt, [name] ([id number]) -------------------------------------------------------------------------------- Multi-Disciplinary Care Plan Details Patient Name: [name] Date of Service: 01/01/2018 1:15 PM Medical Record Number: [id number] Patient Account Number: [id number] Date of Birth/Sex: May 21, 1928 (84 y.o. F) Treating RN: Secundino Ginger Primary Care Tanner Vigna: Carollee Herter, Kendrick Fries Other Clinician: Referring Everlee Quakenbush: Carollee Herter, Kendrick Fries Treating Aniesha Haughn/Extender: Cathie Olden in Treatment: 2 Active Inactive ` Abuse / Safety / Falls / Self Care Management Nursing Diagnoses: Potential for falls Goals: Patient will not experience  any injury related to falls Date Initiated: 12/18/2017 Target Resolution Date: 03/28/2018 Goal Status: Active Interventions: Assess Activities of Daily Living upon admission and as needed Assess fall risk on admission and as needed Assess: immobility, friction, shearing, incontinence upon admission and as needed Assess impairment of mobility on admission and as needed per policy Assess personal safety and home safety (as indicated) on admission and as needed Notes: ` Nutrition Nursing Diagnoses: Imbalanced nutrition Goals: Patient/caregiver agrees to and verbalizes understanding of need to use nutritional supplements and/or vitamins as prescribed Date Initiated: 12/18/2017 Target Resolution Date: 03/28/2018 Goal Status: Active Interventions: Assess patient nutrition upon admission and as needed per policy Notes: ` Orientation to the Wound Care Program Nursing Diagnoses: Knowledge deficit related to the wound healing center program Goals: Patient/caregiver will verbalize understanding of the [place], [place] ([id number]) Date Initiated: 12/18/2017 Target Resolution Date: 12/27/2017 Goal Status: Active Interventions: Provide education on orientation to the wound center Notes: ` Pain, Acute or Chronic Nursing Diagnoses: Pain, acute or chronic: actual or potential Potential alteration in comfort, pain Goals: Patient/caregiver will verbalize adequate pain control between visits  Date Initiated: 12/18/2017 Target Resolution Date: 03/28/2018 Goal Status: Active Interventions: Complete pain assessment as per visit requirements Notes: ` Wound/Skin Impairment Nursing Diagnoses: Impaired tissue integrity Knowledge deficit related to ulceration/compromised skin integrity Goals: Ulcer/skin breakdown will have a volume reduction of 80% by week 12 Date Initiated: 12/18/2017 Target Resolution Date: 03/28/2018 Goal Status: Active Interventions: Assess  patient/caregiver ability to perform ulcer/skin care regimen upon admission and as needed Assess ulceration(s) every visit Notes: Electronic Signature(s) Signed: 01/01/2018 3:51:06 PM By: Secundino Ginger Entered By: Secundino Ginger on 01/01/2018 14:09:07 Schildt, Anvita ([id number]) -------------------------------------------------------------------------------- Pain Assessment Details Patient Name: [name] Date of Service: 01/01/2018 1:15 PM Medical Record Number: [id number] Patient Account Number: [id number] Date of Birth/Sex: 1927/09/03 (84 y.o. F) Treating RN: Secundino Ginger Primary Care Iaan Oregel: Carollee Herter, Kendrick Fries Other Clinician: Referring Aanvi Voyles: Carollee Herter, Kendrick Fries Treating Edelin Fryer/Extender: Cathie Olden in Treatment: 2 Active Problems Location of Pain Severity and Description of Pain Patient Has Paino No Site Locations Pain Management and Medication Current Pain Management: Electronic Signature(s) Signed: 01/01/2018 3:51:06 PM By: Secundino Ginger Entered By: Secundino Ginger on 01/01/2018 13:42:05 Cartier, Sinthia ([id number]) -------------------------------------------------------------------------------- Patient/Caregiver Education Details Patient Name: [name] Date of Service: 01/01/2018 1:15 PM Medical Record Number: [id number] Patient Account Number: [id number] Date of Birth/Gender: [birth year] (84 y.o. F) Treating RN: Roger Shelter Primary Care Physician: Carollee Herter, Kendrick Fries Other Clinician: Referring Physician: Roma Schanz Treating Physician/Extender: Cathie Olden in Treatment: 2 Education Assessment Education Provided To: Patient Education Topics Provided Wound Debridement: Handouts: Wound Debridement Methods: Explain/Verbal Responses: State content correctly Wound/Skin Impairment: Handouts: Caring for Your Ulcer Methods: Explain/Verbal Responses: State content correctly Electronic Signature(s) Signed: 01/02/2018 10:58:55 AM By: Roger Shelter Entered By: Roger Shelter on 01/01/2018 14:52:45 Jarmon, Decarla ([id number]) -------------------------------------------------------------------------------- Wound Assessment Details Patient Name: [name] Date of Service: 01/01/2018 1:15 PM Medical Record Number: [id number] Patient Account Number: [id number] Date of Birth/Sex: 1927-06-29 (84 y.o. F) Treating RN: Secundino Ginger Primary Care Leanord Thibeau: Carollee Herter, Kendrick Fries Other Clinician: Referring Jaquitta Dupriest: Carollee Herter, Kendrick Fries Treating Zarin Knupp/Extender: Cathie Olden in Treatment: 2 Wound Status Wound Number: 1 Primary Venous Leg Ulcer Etiology: Wound Location: Left Lower Leg - Lateral Wound Status: Open Wounding Event: Gradually Appeared Comorbid Cataracts, Hypertension, Osteoarthritis, Date Acquired: 11/17/2017 History: Neuropathy Weeks Of Treatment: 2 Clustered Wound: No Photos Photo Uploaded By: Secundino Ginger on 01/01/2018 14:35:49 Wound Measurements Length: (cm) 3.6 Width: (cm) 1.9 Depth: (cm) 0.3 Area: (cm) 5.372 Volume: (cm) 1.612 % Reduction in Area: -38.7% % Reduction in Volume: -316.5% Epithelialization: None Tunneling: No Undermining: No Wound Description Full Thickness Without Exposed Support Foul Odo Classification: Structures Slough/F Wound Margin: Flat and Intact Exudate Medium Amount: Exudate Type: Serous Exudate Color: amber r After Cleansing: No ibrino No Wound Bed Granulation Amount: Small (1-33%) Exposed Structure Granulation Quality: Pink Fascia Exposed: No Necrotic Amount: Large (67-100%) Fat Layer (Subcutaneous Tissue) Exposed: Yes Necrotic Quality: Eschar, Adherent Slough Tendon Exposed: No Muscle Exposed: No Joint Exposed: No Bone Exposed: No Moustafa, Leily ([id number]) Periwound Skin Texture Texture Color No Abnormalities Noted: No No Abnormalities Noted: No Callus: No Atrophie Blanche: No Crepitus: No Cyanosis: No Excoriation: No Ecchymosis:  No Induration: No Erythema: No Rash: No Hemosiderin Staining: No Scarring: No Mottled: No Pallor: No Moisture Rubor: No No Abnormalities Noted: No Dry / Scaly: No Temperature / Pain Maceration: No Temperature: No Abnormality Tenderness on Palpation: Yes Wound Preparation Ulcer Cleansing: Rinsed/Irrigated with Saline Topical Anesthetic Applied: Other: lidocaine 4%, Treatment Notes Wound #1 (Left, Lateral Lower Leg) 1. Cleansed with:  Clean wound with Normal Saline 2. Anesthetic Topical Lidocaine 4% cream to wound bed prior to debridement 4. Dressing Applied: Santyl Ointment 5. Secondary Dressing Applied Bordered Foam Dressing Dry [place] Notes boarder foam dressing to sacrum. Electronic Signature(s) Signed: 01/01/2018 3:51:06 PM By: Secundino Ginger Entered By: Secundino Ginger on 01/01/2018 13:50:34 Dethloff, Nyaira ([id number]) -------------------------------------------------------------------------------- Wound Assessment Details Patient Name: [name] Date of Service: 01/01/2018 1:15 PM Medical Record Number: [id number] Patient Account Number: [id number] Date of Birth/Sex: 01/30/1928 (84 y.o. F) Treating RN: Secundino Ginger Primary Care Akeema Broder: Carollee Herter, Kendrick Fries Other Clinician: Referring Zori Benbrook: Carollee Herter, Kendrick Fries Treating Azavion Bouillon/Extender: Cathie Olden in Treatment: 2 Wound Status Wound Number: 2 Primary To be determined Etiology: Wound Location: Left Toe Second Wound Status: Open Wounding Event: Gradually Appeared Comorbid Cataracts, Hypertension, Osteoarthritis, Date Acquired: 09/15/2017 History: Neuropathy Weeks Of Treatment: 2 Clustered Wound: No Photos Photo Uploaded By: Secundino Ginger on 01/01/2018 14:35:50 Wound Measurements Length: (cm) 0.1 Width: (cm) 0.1 Depth: (cm) 0.1 Area: (cm) 0.008 Volume: (cm) 0.001 % Reduction in Area: 0% % Reduction in Volume: 0% Epithelialization: None Tunneling: No Undermining: No Wound  Description Full Thickness Without Exposed Support Foul Odo Classification: Structures Slough/F Wound Margin: Flat and Intact Exudate Small Amount: Exudate Type: Serous Exudate Color: amber r After Cleansing: No ibrino No Wound Bed Granulation Amount: Medium (34-66%) Exposed Structure Granulation Quality: Pink Fascia Exposed: No Necrotic Amount: Medium (34-66%) Fat Layer (Subcutaneous Tissue) Exposed: Yes Necrotic Quality: Adherent Slough Tendon Exposed: No Muscle Exposed: No Joint Exposed: No Bone Exposed: No Lorey, Ewelina ([id number]) Periwound Skin Texture Texture Color No Abnormalities Noted: No No Abnormalities Noted: No Callus: No Atrophie Blanche: No Crepitus: No Cyanosis: No Excoriation: No Ecchymosis: No Induration: No Erythema: No Rash: No Hemosiderin Staining: No Scarring: No Mottled: No Pallor: No Moisture Rubor: No No Abnormalities Noted: No Dry / Scaly: No Temperature / Pain Maceration: No Temperature: No Abnormality Tenderness on Palpation: Yes Wound Preparation Ulcer Cleansing: Rinsed/Irrigated with Saline Topical Anesthetic Applied: Other: lidocaine 4%, Treatment Notes Wound #2 (Left Toe Second) 1. Cleansed with: Clean wound with Normal Saline 2. Anesthetic Topical Lidocaine 4% cream to wound bed prior to debridement 4. Dressing Applied: Santyl Ointment 5. Secondary Dressing Applied Bordered Foam Dressing Dry [place] Notes boarder foam dressing to sacrum. Electronic Signature(s) Signed: 01/01/2018 3:51:06 PM By: Secundino Ginger Entered By: Secundino Ginger on 01/01/2018 13:50:58 Calk, Kendyl ([id number]) -------------------------------------------------------------------------------- Wound Assessment Details Patient Name: [name] Date of Service: 01/01/2018 1:15 PM Medical Record Number: [id number] Patient Account Number: [id number] Date of Birth/Sex: 04/27/1928 (84 y.o. F) Treating RN: Secundino Ginger Primary Care  Lorcan Shelp: Carollee Herter, Kendrick Fries Other Clinician: Referring Vanassa Penniman: Carollee Herter, Kendrick Fries Treating Mariapaula Krist/Extender: Cathie Olden in Treatment: 2 Wound Status Wound Number: 3 Primary Pressure Ulcer Etiology: Wound Location: Right Lower Leg - Posterior Wound Status: Open Wounding Event: Pressure Injury Comorbid Cataracts, Hypertension, Osteoarthritis, Date Acquired: 12/18/2017 History: Neuropathy Weeks Of Treatment: 2 Clustered Wound: No Photos Photo Uploaded By: Secundino Ginger on 01/01/2018 14:37:04 Wound Measurements Length: (cm) 1.5 Width: (cm) 1.3 Depth: (cm) 0.2 Area: (cm) 1.532 Volume: (cm) 0.306 % Reduction in Area: -387.9% % Reduction in Volume: -887.1% Epithelialization: None Tunneling: No Undermining: No Wound Description Classification: Category/Stage II Foul Odor Wound Margin: Flat and Intact Slough/Fi Exudate Amount: None Present After Cleansing: No brino Yes Wound Bed Granulation Amount: None Present (0%) Exposed Structure Necrotic Amount: Large (67-100%) Fascia Exposed: No Necrotic Quality: Eschar Fat Layer (Subcutaneous Tissue) Exposed: No Tendon Exposed: No Muscle  Exposed: No Joint Exposed: No Bone Exposed: No Periwound Skin Texture Texture Color No Abnormalities Noted: No No Abnormalities Noted: No Matsen, Keliah ([id number]) Moisture Temperature / Pain No Abnormalities Noted: No Temperature: No Abnormality Tenderness on Palpation: Yes Wound Preparation Ulcer Cleansing: Rinsed/Irrigated with Saline Topical Anesthetic Applied: None Treatment Notes Wound #3 (Right, Posterior Lower Leg) 1. Cleansed with: Clean wound with Normal Saline 2. Anesthetic Topical Lidocaine 4% cream to wound bed prior to debridement 4. Dressing Applied: Santyl Ointment 5. Secondary Dressing Applied Bordered Foam Dressing Dry [place] Notes boarder foam dressing to sacrum. Electronic Signature(s) Signed: 01/01/2018 3:51:06 PM By: Secundino Ginger Entered By: Secundino Ginger on 01/01/2018 13:51:44 Shurley, Zahraa ([id number]) -------------------------------------------------------------------------------- Wound Assessment Details Patient Name: [name] Date of Service: 01/01/2018 1:15 PM Medical Record Number: [id number] Patient Account Number: [id number] Date of Birth/Sex: December 01, 1927 (84 y.o. F) Treating RN: Secundino Ginger Primary Care Johnny Gorter: Carollee Herter, Kendrick Fries Other Clinician: Referring Antonious Omahoney: Carollee Herter, Kendrick Fries Treating Yasemin Rabon/Extender: Lawanda Cousins Weeks in Treatment: 2 Wound Status Wound Number: 4 Primary Etiology: Pressure Ulcer Wound Location: Left Calcaneus Wound Status: Open Wounding Event: Pressure Injury Date Acquired: 12/28/2017 Weeks Of Treatment: 0 Clustered Wound: No Photos Photo Uploaded By: Secundino Ginger on 01/01/2018 14:37:05 Wound Measurements Length: (cm) 4.6 % Re Width: (cm) 4.4 % Re Depth: (cm) 0.1 Area: (cm) 15.896 Volume: (cm) 1.59 duction in Area: duction in Volume: Periwound Skin Texture Texture Color No Abnormalities Noted: No No Abnormalities Noted: No Moisture No Abnormalities Noted: No Treatment Notes Wound #4 (Left Calcaneus) 1. Cleansed with: Clean wound with Normal Saline 2. Anesthetic Topical Lidocaine 4% cream to wound bed prior to debridement 4. Dressing Applied: Santyl Ointment 5. Secondary Dressing Applied Bordered Foam Dressing Sookdeo, Razan ([id number]) Dry Gauze [place] Notes boarder foam dressing to sacrum. Electronic Signature(s) Signed: 01/01/2018 3:51:06 PM By: Secundino Ginger Entered By: Secundino Ginger on 01/01/2018 13:54:47 Repetto, Glora ([id number]) -------------------------------------------------------------------------------- Wound Assessment Details Patient Name: [name] Date of Service: 01/01/2018 1:15 PM Medical Record Number: [id number] Patient Account Number: [id number] Date of Birth/Sex: [birth year] (84 y.o. F) Treating RN: Secundino Ginger Primary Care Arwyn Besaw: Carollee Herter, Kendrick Fries Other Clinician: Referring Alana [place]: Carollee Herter, Kendrick Fries Treating Disa Riedlinger/Extender: Lawanda Cousins Weeks in Treatment: 2 Wound Status Wound Number: 5 Primary Etiology: Pressure Ulcer Wound Location: Coccyx Wound Status: Open Wounding Event: Pressure Injury Date Acquired: 12/18/2017 Weeks Of Treatment: 0 Clustered Wound: No Photos Photo Uploaded By: Secundino Ginger on 01/01/2018 14:37:55 Wound Measurements Length: (cm) Width: (cm) Depth: (cm) Area: (cm) Volume: (cm) 2.5 % Reduction in Area: 2.5 % Reduction in Volume: 0.1 4.909 0.491 Periwound Skin Texture Texture Color No Abnormalities Noted: No No Abnormalities Noted: No Moisture No Abnormalities Noted: No Treatment Notes Wound #5 (Coccyx) 1. Cleansed with: Clean wound with Normal Saline 2. Anesthetic Topical Lidocaine 4% cream to wound bed prior to debridement 4. Dressing Applied: Santyl Ointment 5. Secondary Dressing Applied Bordered Foam Dressing Mckethan, Shaylie ([id number]) Dry Gauze [place] Notes boarder foam dressing to sacrum. Electronic Signature(s) Signed: 01/01/2018 3:51:06 PM By: Secundino Ginger Entered By: Secundino Ginger on 01/01/2018 14:04:49 Scalisi, Christyna ([id number]) -------------------------------------------------------------------------------- [place] Details Patient Name: [name] Date of Service: 01/01/2018 1:15 PM Medical Record Number: [id number] Patient Account Number: [id number] Date of Birth/Sex: June 23, 1928 (84 y.o. F) Treating RN: Secundino Ginger Primary Care Thien Berka: Carollee Herter, Kendrick Fries Other Clinician: Referring Juliannah Ohmann: Carollee Herter, Kendrick Fries Treating Braelin Costlow/Extender: Cathie Olden in Treatment: 2 Vital Signs Time Taken: 13:40 Temperature (F): 98.3 Pulse (bpm): 99 Respiratory Rate (breaths/min):  16 Blood Pressure (mmHg): 127/69 Reference Range: 80 - 120 mg / dl Electronic Signature(s) Signed: 01/01/2018 3:51:06 PM By: Secundino Ginger Entered By: Secundino Ginger on 01/01/2018 13:42:37

## 2018-01-05 DIAGNOSIS — M19111 Post-traumatic osteoarthritis, right shoulder: Secondary | ICD-10-CM | POA: Diagnosis not present

## 2018-01-05 DIAGNOSIS — I10 Essential (primary) hypertension: Secondary | ICD-10-CM | POA: Diagnosis not present

## 2018-01-05 DIAGNOSIS — M19112 Post-traumatic osteoarthritis, left shoulder: Secondary | ICD-10-CM | POA: Diagnosis not present

## 2018-01-05 DIAGNOSIS — S81802D Unspecified open wound, left lower leg, subsequent encounter: Secondary | ICD-10-CM | POA: Diagnosis not present

## 2018-01-05 DIAGNOSIS — M48061 Spinal stenosis, lumbar region without neurogenic claudication: Secondary | ICD-10-CM | POA: Diagnosis not present

## 2018-01-05 DIAGNOSIS — Z96619 Presence of unspecified artificial shoulder joint: Secondary | ICD-10-CM | POA: Diagnosis not present

## 2018-01-05 NOTE — Patient Outreach (Signed)
[facility]) Care Management  [facility] Social Work  01/05/2018  Katherine Owen Dec 17, 1927 [id number]  Subjective:  CSW met with pt, son and daughter in law in pt's home on 01/02/2018 along with [facility] RNCM, Tomasa Rand, RN.  Objective: [facility] CSW to assist patient and family with community based resources to aide in their well-being, quality of life and overall safety and needs.    Encounter Medications:  Outpatient Encounter Medications as of 01/02/2018  Medication Sig  . atorvastatin (LIPITOR) 10 MG tablet Take 1 tablet (10 mg total) by mouth at bedtime. For high cholesterol  . celecoxib (CELEBREX) 200 MG capsule Take 1 capsule (200 mg total) by mouth daily.  . Cholecalciferol (VITAMIN D3) 5000 units CAPS Take 5,000 Units by mouth at bedtime.   . famotidine (PEPCID) 10 MG tablet Take 10 mg by mouth at bedtime.   . furosemide (LASIX) 40 MG tablet Take 1 tablet (40 mg total) by mouth daily.  Marland Kitchen gabapentin (NEURONTIN) 100 MG capsule Take 1 capsule (100 mg total) by mouth 3 (three) times daily.  Marland Kitchen lidocaine (XYLOCAINE) 4 % external solution Apply topically daily as needed for mild pain or moderate pain (shoulders, neck and back pain).   Marland Kitchen losartan (COZAAR) 100 MG tablet Take 1 tablet (100 mg total) by mouth daily.  . Misc. Devices [facility]) MISC Use as directed  . montelukast (SINGULAIR) 10 MG tablet Take 1 tablet (10 mg total) by mouth at bedtime.  . Multiple Vitamins-Minerals (CENTRUM SILVER) tablet Take 1 tablet by mouth daily.  . NONFORMULARY OR COMPOUNDED ITEM Lift recliner #1  As directed  Dx spinal stenosis, osteoarthritis both shoulder ,  Low ext weakness  . pantoprazole (PROTONIX) 40 MG tablet Take 1 tablet (40 mg total) by mouth daily.  Marland Kitchen SANTYL ointment Apply 1 application topically daily.  Marland Kitchen senna-docusate (SENOKOT-S) 8.6-50 MG per tablet Take 1 tablet by mouth at bedtime. For constipation   . Simethicone (GAS RELIEF 80 PO) Take 1 tablet daily as needed by mouth (for  bloating).  . traMADol (ULTRAM) 50 MG tablet Take 1 tablet (50 mg total) by mouth 3 (three) times daily.  . [DISCONTINUED] esomeprazole (NEXIUM) 20 MG capsule Take 20 mg by mouth daily before breakfast.  . [DISCONTINUED] Menthol-Zinc Oxide (CALMOSEPTINE EX) Apply 1 application topically daily. Use on buttock  . [DISCONTINUED] NONFORMULARY OR COMPOUNDED ITEM velcro compression hose  #1    Dx edema  . [DISCONTINUED] potassium chloride (K-DUR) 10 MEQ tablet Take 1 tablet (10 mEq total) by mouth daily. With each dose of lasix   No facility-administered encounter medications on file as of 01/02/2018.     Functional Status:  In your present state of health, do you have any difficulty performing the following activities: 01/02/2018  Hearing? Y  Vision? Y  Difficulty concentrating or making decisions? N  Walking or climbing stairs? Y  Dressing or bathing? Y  Doing errands, shopping? Y  Preparing Food and eating ? Y  Using the Toilet? Y  In the past six months, have you accidently leaked urine? N  Do you have problems with loss of bowel control? N  Managing your Medications? Y  Managing your Finances? Y  Housekeeping or managing your Housekeeping? Y  Some recent data might be hidden    Fall/Depression Screening:  PHQ 2/9 Scores 12/26/2017 09/02/2017 05/21/2016 01/04/2016 10/24/2015 10/27/2014 06/29/2014  PHQ - 2 Score 0 0 0 0 0 0 0    Assessment:  CSW met with patient and her  family to discuss care needs and possible placement.  Pt and family are hoping that she can be admitted to [facility] where she has been before. She is hoping for short term rehab but the family is concerned that she needs too much assistance and support to remain at home.  She has some hired caregiver assistance and the son also is very involved and helps with getting her in and out of bed (with caregiver assistance too).   CSW has advised that we will have to see if her needs require SNF level of care (for Medicare to  cover initial stay) as well as apply for Medicaid.   Plan:  [facility] CM Care Plan Problem One     Most Recent Value  Care Plan Problem One  Patient lives alone and is  needing more assistance .   Role Documenting the Problem One  Clinical Social Worker  Care Plan for Problem One  Active  [facility] Long Term Goal   Patient and family will have adequate support arranged for pt in the next 31 days.  [facility]N Long Term Goal Start Date  01/02/18  Interventions for Problem One Long Term Goal  CSW working with pt and family to pursue possible SNF placement. CSW also encouraged they apply for Medicaid and have PCP complete FL2 for possible placement. CSW discussed possible alternative options including moving in with family, additional home caregiver/support.    THN CM Short Term Goal #1    Family will report FL2 completed by PCP in the next 14 days.   THN CM Short Term Goal #1 Start Date  01/02/18  Interventions for Short Term Goal #1  CSW provided an FL2 form for son to deliver to PCP. CSW has asked that they call or fax the [facility] to [facility] CSW once completed. CSW will pursue SNF bed offers once received.   THN CM Short Term Goal #2   Family will have knowledge of her Medicaid eligibilty in the next 14 days.   THN CM Short Term Goal #2 Start Date  01/02/18  Interventions for Short Term Goal #2  CSW encourqaged family to seek Elderly Law/legal support if desired  and apply for Medicaid. CSW discussed coverage of SNF with them during home visit. CSW has discussed possible SNF options they wish to consider and they plan outreach to [facility].        CSW will plan f/u call to son for updates on progress and to assist.   Eduard Clos, MSW, [place] Worker  [place] [phone]

## 2018-01-07 ENCOUNTER — Other Ambulatory Visit: Payer: Self-pay | Admitting: *Deleted

## 2018-01-07 NOTE — Patient Outreach (Signed)
[facility]) Care Management  01/07/2018  Katherine Owen 05/01/1928 [id number]   CSW spoke with pt's son by phone today who reports plans to take the FL2 to PCP for completion. He also shared they visited [facility] where they had hoped to consider pt going to for rehab/long term.  CSW updated son that [facility] Admissions rep states they are currently not a preferred provider with [facility] in order to consider the Medicare waiver option. CSW provided him an updated list of preferred SNF providers nearby.   CSW reminded son to consider applying for Medicaid and/or consulting with an Elder Training and development officer for guidance on her assets and potential Medicaid eligibility.   CSW will send a message to PCP regarding plans to consider SNF placement and the need for FL2 completion.  CSW will plan follow up call to son next week for updates on above plans.   Eduard Clos, MSW, [place] Worker  [place] [phone]

## 2018-01-08 DIAGNOSIS — S81802D Unspecified open wound, left lower leg, subsequent encounter: Secondary | ICD-10-CM | POA: Diagnosis not present

## 2018-01-08 DIAGNOSIS — Z96619 Presence of unspecified artificial shoulder joint: Secondary | ICD-10-CM | POA: Diagnosis not present

## 2018-01-08 DIAGNOSIS — M19111 Post-traumatic osteoarthritis, right shoulder: Secondary | ICD-10-CM | POA: Diagnosis not present

## 2018-01-08 DIAGNOSIS — I10 Essential (primary) hypertension: Secondary | ICD-10-CM | POA: Diagnosis not present

## 2018-01-08 DIAGNOSIS — M48061 Spinal stenosis, lumbar region without neurogenic claudication: Secondary | ICD-10-CM | POA: Diagnosis not present

## 2018-01-08 DIAGNOSIS — M19112 Post-traumatic osteoarthritis, left shoulder: Secondary | ICD-10-CM | POA: Diagnosis not present

## 2018-01-09 DIAGNOSIS — I10 Essential (primary) hypertension: Secondary | ICD-10-CM | POA: Diagnosis not present

## 2018-01-09 DIAGNOSIS — S81802D Unspecified open wound, left lower leg, subsequent encounter: Secondary | ICD-10-CM | POA: Diagnosis not present

## 2018-01-09 DIAGNOSIS — M19111 Post-traumatic osteoarthritis, right shoulder: Secondary | ICD-10-CM | POA: Diagnosis not present

## 2018-01-09 DIAGNOSIS — M48061 Spinal stenosis, lumbar region without neurogenic claudication: Secondary | ICD-10-CM | POA: Diagnosis not present

## 2018-01-09 DIAGNOSIS — M19112 Post-traumatic osteoarthritis, left shoulder: Secondary | ICD-10-CM | POA: Diagnosis not present

## 2018-01-09 DIAGNOSIS — Z96619 Presence of unspecified artificial shoulder joint: Secondary | ICD-10-CM | POA: Diagnosis not present

## 2018-01-12 ENCOUNTER — Telehealth: Payer: Self-pay | Admitting: Family Medicine

## 2018-01-12 NOTE — Telephone Encounter (Signed)
Copied from [place] [id number]. Topic: Quick Communication - Rx Refill/Question >> Jan 12, 2018  2:03 PM Waldemar Dickens, Sade R wrote: Medication: furosemide (LASIX) 40 MG tablet     Has the patient contacted their pharmacy? Yes  Preferred Pharmacy (with phone number or street name): [place], [place] [phone] (Phone) [phone] (Fax)

## 2018-01-13 DIAGNOSIS — M19111 Post-traumatic osteoarthritis, right shoulder: Secondary | ICD-10-CM | POA: Diagnosis not present

## 2018-01-13 DIAGNOSIS — M19112 Post-traumatic osteoarthritis, left shoulder: Secondary | ICD-10-CM | POA: Diagnosis not present

## 2018-01-13 DIAGNOSIS — I10 Essential (primary) hypertension: Secondary | ICD-10-CM | POA: Diagnosis not present

## 2018-01-13 DIAGNOSIS — M48061 Spinal stenosis, lumbar region without neurogenic claudication: Secondary | ICD-10-CM | POA: Diagnosis not present

## 2018-01-13 DIAGNOSIS — Z96619 Presence of unspecified artificial shoulder joint: Secondary | ICD-10-CM | POA: Diagnosis not present

## 2018-01-13 DIAGNOSIS — S81802D Unspecified open wound, left lower leg, subsequent encounter: Secondary | ICD-10-CM | POA: Diagnosis not present

## 2018-01-13 NOTE — Telephone Encounter (Signed)
Called patient regarding her refill for furosemide 40 mg tab. It was last filled on 11/19/17 for #90 tabs and 1 refill.  Her prescription expires on 11/19/18. Too soon for refill. Pt voiced understanding and she will contact her pharmacy.

## 2018-01-15 ENCOUNTER — Encounter: Payer: Medicare Other | Admitting: Nurse Practitioner

## 2018-01-15 ENCOUNTER — Encounter: Payer: Self-pay | Admitting: *Deleted

## 2018-01-15 ENCOUNTER — Other Ambulatory Visit: Payer: Self-pay | Admitting: *Deleted

## 2018-01-15 DIAGNOSIS — S81802D Unspecified open wound, left lower leg, subsequent encounter: Secondary | ICD-10-CM | POA: Diagnosis not present

## 2018-01-15 DIAGNOSIS — L89619 Pressure ulcer of right heel, unspecified stage: Secondary | ICD-10-CM | POA: Diagnosis not present

## 2018-01-15 DIAGNOSIS — M48061 Spinal stenosis, lumbar region without neurogenic claudication: Secondary | ICD-10-CM | POA: Diagnosis not present

## 2018-01-15 DIAGNOSIS — L89153 Pressure ulcer of sacral region, stage 3: Secondary | ICD-10-CM | POA: Diagnosis not present

## 2018-01-15 DIAGNOSIS — I872 Venous insufficiency (chronic) (peripheral): Secondary | ICD-10-CM | POA: Diagnosis not present

## 2018-01-15 DIAGNOSIS — Z96619 Presence of unspecified artificial shoulder joint: Secondary | ICD-10-CM | POA: Diagnosis not present

## 2018-01-15 DIAGNOSIS — M19111 Post-traumatic osteoarthritis, right shoulder: Secondary | ICD-10-CM | POA: Diagnosis not present

## 2018-01-15 DIAGNOSIS — M19112 Post-traumatic osteoarthritis, left shoulder: Secondary | ICD-10-CM | POA: Diagnosis not present

## 2018-01-15 DIAGNOSIS — G629 Polyneuropathy, unspecified: Secondary | ICD-10-CM | POA: Diagnosis not present

## 2018-01-15 DIAGNOSIS — I10 Essential (primary) hypertension: Secondary | ICD-10-CM | POA: Diagnosis not present

## 2018-01-15 DIAGNOSIS — Z993 Dependence on wheelchair: Secondary | ICD-10-CM | POA: Diagnosis not present

## 2018-01-15 DIAGNOSIS — L89892 Pressure ulcer of other site, stage 2: Secondary | ICD-10-CM | POA: Diagnosis not present

## 2018-01-15 DIAGNOSIS — I739 Peripheral vascular disease, unspecified: Secondary | ICD-10-CM | POA: Diagnosis not present

## 2018-01-15 DIAGNOSIS — L97822 Non-pressure chronic ulcer of other part of left lower leg with fat layer exposed: Secondary | ICD-10-CM | POA: Diagnosis not present

## 2018-01-15 NOTE — Patient Outreach (Signed)
[facility]) Care Management  [facility] Social Work  01/15/2018  Katherine Owen 10/07/1927 [id number]  Subjective:  "taking mom to the wound center"  Objective: [facility] CSW to assist patient and family with community based resources to aide in their well-being, quality of life and overall safety and needs.   Encounter Medications:  Outpatient Encounter Medications as of 01/15/2018  Medication Sig  . atorvastatin (LIPITOR) 10 MG tablet Take 1 tablet (10 mg total) by mouth at bedtime. For high cholesterol  . celecoxib (CELEBREX) 200 MG capsule Take 1 capsule (200 mg total) by mouth daily.  . Cholecalciferol (VITAMIN D3) 5000 units CAPS Take 5,000 Units by mouth at bedtime.   . famotidine (PEPCID) 10 MG tablet Take 10 mg by mouth at bedtime.   . furosemide (LASIX) 40 MG tablet Take 1 tablet (40 mg total) by mouth daily.  Marland Kitchen gabapentin (NEURONTIN) 100 MG capsule Take 1 capsule (100 mg total) by mouth 3 (three) times daily.  Marland Kitchen lidocaine (XYLOCAINE) 4 % external solution Apply topically daily as needed for mild pain or moderate pain (shoulders, neck and back pain).   Marland Kitchen losartan (COZAAR) 100 MG tablet Take 1 tablet (100 mg total) by mouth daily.  . Misc. Devices [facility]) MISC Use as directed  . montelukast (SINGULAIR) 10 MG tablet Take 1 tablet (10 mg total) by mouth at bedtime.  . Multiple Vitamins-Minerals (CENTRUM SILVER) tablet Take 1 tablet by mouth daily.  . NONFORMULARY OR COMPOUNDED ITEM Lift recliner #1  As directed  Dx spinal stenosis, osteoarthritis both shoulder ,  Low ext weakness  . OVER THE COUNTER MEDICATION 1 application 2 (two) times daily as needed. Lemon Salve ( topical hemp extract cream  . pantoprazole (PROTONIX) 40 MG tablet Take 1 tablet (40 mg total) by mouth daily.  Marland Kitchen SANTYL ointment Apply 1 application topically daily.  Marland Kitchen senna-docusate (SENOKOT-S) 8.6-50 MG per tablet Take 1 tablet by mouth at bedtime. For constipation   . Simethicone (GAS RELIEF 80 PO)  Take 1 tablet daily as needed by mouth (for bloating).  . traMADol (ULTRAM) 50 MG tablet Take 1 tablet (50 mg total) by mouth 3 (three) times daily.   No facility-administered encounter medications on file as of 01/15/2018.     Functional Status:  In your present state of health, do you have any difficulty performing the following activities: 01/02/2018  Hearing? Y  Vision? Y  Difficulty concentrating or making decisions? N  Walking or climbing stairs? Y  Dressing or bathing? Y  Doing errands, shopping? Y  Preparing Food and eating ? Y  Using the Toilet? Y  In the past six months, have you accidently leaked urine? N  Do you have problems with loss of bowel control? N  Managing your Medications? Y  Managing your Finances? Y  Housekeeping or managing your Housekeeping? Y  Some recent data might be hidden    Fall/Depression Screening:  PHQ 2/9 Scores 12/26/2017 09/02/2017 05/21/2016 01/04/2016 10/24/2015 10/27/2014 06/29/2014  PHQ - 2 Score 0 0 0 0 0 0 0    Assessment:  CSW spoke with pt's son by phone who was a bit confused about the FL2 form. CSW has explained and he plans to take the form to PCP for completion. He also plans to get wound center to provide the specific skin/wound care instructions for the form.  CSW reviewed the process for placement again with pt's son and encouraged him to follow up not only on the [facility] but also Medicaid.  Plan:  [facility] CM Care Plan Problem One     Most Recent Value  Care Plan Problem One  Patient lives alone and is  needing more assistance .   Role Documenting the Problem One  Clinical Social Worker  Care Plan for Problem One  Active  [facility] Long Term Goal   Patient and family will have adequate support arranged for pt in the next 31 days.  THN Long Term Goal Start Date  01/02/18  Interventions for Problem One Long Term Goal  Pt and family are pursuing placement at this time.   THN CM Short Term Goal #1    Family will report FL2 completed by PCP in the next  14 days.   THN CM Short Term Goal #1 Start Date  01/02/18  Interventions for Short Term Goal #1  Son plans to get [facility] completed this week. Son to advise CSW once completed.   THN CM Short Term Goal #2   Family will have knowledge of her Medicaid eligibilty in the next 14 days.   THN CM Short Term Goal #2 Start Date  01/02/18  Interventions for Short Term Goal #2  Son has not initiated an inquiry with Medicaid or with Elder Law Attorney to determine payor source for placment.        CSW will await callback from son with updates. CSW will message PCP.   Eduard Clos, MSW, [place] Worker  [place] [phone]

## 2018-01-16 DIAGNOSIS — I10 Essential (primary) hypertension: Secondary | ICD-10-CM | POA: Diagnosis not present

## 2018-01-16 DIAGNOSIS — M48061 Spinal stenosis, lumbar region without neurogenic claudication: Secondary | ICD-10-CM | POA: Diagnosis not present

## 2018-01-16 DIAGNOSIS — S81802D Unspecified open wound, left lower leg, subsequent encounter: Secondary | ICD-10-CM | POA: Diagnosis not present

## 2018-01-16 DIAGNOSIS — M19112 Post-traumatic osteoarthritis, left shoulder: Secondary | ICD-10-CM | POA: Diagnosis not present

## 2018-01-16 DIAGNOSIS — Z96619 Presence of unspecified artificial shoulder joint: Secondary | ICD-10-CM | POA: Diagnosis not present

## 2018-01-16 DIAGNOSIS — M19111 Post-traumatic osteoarthritis, right shoulder: Secondary | ICD-10-CM | POA: Diagnosis not present

## 2018-01-17 DIAGNOSIS — M19112 Post-traumatic osteoarthritis, left shoulder: Secondary | ICD-10-CM | POA: Diagnosis not present

## 2018-01-17 DIAGNOSIS — I10 Essential (primary) hypertension: Secondary | ICD-10-CM | POA: Diagnosis not present

## 2018-01-17 DIAGNOSIS — M48061 Spinal stenosis, lumbar region without neurogenic claudication: Secondary | ICD-10-CM | POA: Diagnosis not present

## 2018-01-17 DIAGNOSIS — Z96619 Presence of unspecified artificial shoulder joint: Secondary | ICD-10-CM | POA: Diagnosis not present

## 2018-01-17 DIAGNOSIS — M19111 Post-traumatic osteoarthritis, right shoulder: Secondary | ICD-10-CM | POA: Diagnosis not present

## 2018-01-17 DIAGNOSIS — S81802D Unspecified open wound, left lower leg, subsequent encounter: Secondary | ICD-10-CM | POA: Diagnosis not present

## 2018-01-18 DIAGNOSIS — I10 Essential (primary) hypertension: Secondary | ICD-10-CM | POA: Diagnosis not present

## 2018-01-18 DIAGNOSIS — S81802D Unspecified open wound, left lower leg, subsequent encounter: Secondary | ICD-10-CM | POA: Diagnosis not present

## 2018-01-18 DIAGNOSIS — M19112 Post-traumatic osteoarthritis, left shoulder: Secondary | ICD-10-CM | POA: Diagnosis not present

## 2018-01-18 DIAGNOSIS — Z96619 Presence of unspecified artificial shoulder joint: Secondary | ICD-10-CM | POA: Diagnosis not present

## 2018-01-18 DIAGNOSIS — M19111 Post-traumatic osteoarthritis, right shoulder: Secondary | ICD-10-CM | POA: Diagnosis not present

## 2018-01-18 DIAGNOSIS — M48061 Spinal stenosis, lumbar region without neurogenic claudication: Secondary | ICD-10-CM | POA: Diagnosis not present

## 2018-01-19 ENCOUNTER — Ambulatory Visit (INDEPENDENT_AMBULATORY_CARE_PROVIDER_SITE_OTHER): Payer: Medicare Other | Admitting: Vascular Surgery

## 2018-01-19 ENCOUNTER — Telehealth: Payer: Self-pay | Admitting: Family Medicine

## 2018-01-19 DIAGNOSIS — M19111 Post-traumatic osteoarthritis, right shoulder: Secondary | ICD-10-CM | POA: Diagnosis not present

## 2018-01-19 DIAGNOSIS — S81802D Unspecified open wound, left lower leg, subsequent encounter: Secondary | ICD-10-CM | POA: Diagnosis not present

## 2018-01-19 DIAGNOSIS — Z96619 Presence of unspecified artificial shoulder joint: Secondary | ICD-10-CM | POA: Diagnosis not present

## 2018-01-19 DIAGNOSIS — M19112 Post-traumatic osteoarthritis, left shoulder: Secondary | ICD-10-CM | POA: Diagnosis not present

## 2018-01-19 DIAGNOSIS — I10 Essential (primary) hypertension: Secondary | ICD-10-CM | POA: Diagnosis not present

## 2018-01-19 DIAGNOSIS — M48061 Spinal stenosis, lumbar region without neurogenic claudication: Secondary | ICD-10-CM | POA: Diagnosis not present

## 2018-01-19 NOTE — Telephone Encounter (Signed)
Requesting celebrex refill; last refilled by Dr. Etter Sjogren 05/2016. Pt. stated her pcp orders from [place]. Routed to Dr. Etter Sjogren to advise.

## 2018-01-19 NOTE — Telephone Encounter (Signed)
Copied from [place] [id number]. Topic: Quick Communication - See Telephone Encounter >> Jan 19, 2018  3:25 PM Conception Chancy, NT wrote: CRM for notification. See Telephone encounter for: 01/19/18.  Patient is calling and requesting a refill celecoxib (CELEBREX) 200 MG capsule. She states her pcp orders from [employer].

## 2018-01-19 NOTE — Telephone Encounter (Signed)
She is getting it from [employer] ------ does she need to fill out new paperwork-? Its on [employer].

## 2018-01-20 ENCOUNTER — Encounter: Payer: Self-pay | Admitting: Podiatry

## 2018-01-20 ENCOUNTER — Encounter (INDEPENDENT_AMBULATORY_CARE_PROVIDER_SITE_OTHER): Payer: Self-pay | Admitting: Vascular Surgery

## 2018-01-20 ENCOUNTER — Other Ambulatory Visit (INDEPENDENT_AMBULATORY_CARE_PROVIDER_SITE_OTHER): Payer: Medicare Other

## 2018-01-20 ENCOUNTER — Ambulatory Visit (INDEPENDENT_AMBULATORY_CARE_PROVIDER_SITE_OTHER): Payer: Medicare Other | Admitting: Nurse Practitioner

## 2018-01-20 ENCOUNTER — Ambulatory Visit (INDEPENDENT_AMBULATORY_CARE_PROVIDER_SITE_OTHER): Payer: Medicare Other | Admitting: Podiatry

## 2018-01-20 VITALS — BP 102/67 | HR 90 | Resp 16 | Ht 64.0 in | Wt 175.0 lb

## 2018-01-20 DIAGNOSIS — R600 Localized edema: Secondary | ICD-10-CM | POA: Diagnosis not present

## 2018-01-20 DIAGNOSIS — L97929 Non-pressure chronic ulcer of unspecified part of left lower leg with unspecified severity: Secondary | ICD-10-CM

## 2018-01-20 DIAGNOSIS — I890 Lymphedema, not elsewhere classified: Secondary | ICD-10-CM | POA: Diagnosis not present

## 2018-01-20 DIAGNOSIS — B351 Tinea unguium: Secondary | ICD-10-CM

## 2018-01-20 DIAGNOSIS — M79674 Pain in right toe(s): Secondary | ICD-10-CM | POA: Diagnosis not present

## 2018-01-20 DIAGNOSIS — M79675 Pain in left toe(s): Secondary | ICD-10-CM | POA: Diagnosis not present

## 2018-01-20 DIAGNOSIS — I872 Venous insufficiency (chronic) (peripheral): Secondary | ICD-10-CM | POA: Diagnosis not present

## 2018-01-20 NOTE — Telephone Encounter (Signed)
Author phoned pt. Re: celebrex refill via [employer]. Author left detailed VM re: going onto [employer] to fill out form. Call back number provided [phone].

## 2018-01-20 NOTE — Progress Notes (Signed)
Subjective:    Patient ID: Katherine Owen, female    DOB: Nov 27, 1927, 84 y.o.   MRN: [id number] Chief Complaint  Patient presents with  . Follow-up    unna check and ultrasound results    HPI   Katherine Owen returns to the office for followup evaluation regarding leg swelling and wound formation. The patient was seen with multiple family members, including her son.  The swelling has persisted and the pain associated with swelling continues. She has been visiting the wound center to have her bilateral wounds attended to.  Since her last visit she is also since developed new ulcers as well.  Since the previous visit the patient has been wearing graduated compression stockings and has noted little if any improvement in the lymphedema. The patient has been using compression routinely morning until night.  The patient also states elevation during the day and exercise is being done too.  She underwent a venous reflux study, which was notable for reflux in the bilateral GSV, femoral and popliteal veins, as well as SSV.   The patient also underwent an ABI screening on  12/18/2017, which revealed an ABI of 0.98 on both the right and left lower extremities.    Constitutional: [] Weight loss  [] Fever  [] Chills Cardiac: [] Chest pain   [] Chest pressure   [] Palpitations   [] Shortness of breath when laying flat   [] Shortness of breath with exertion. Vascular:  [] Pain in legs with walking   [] Pain in legs with standing  [] History of DVT   [] Phlebitis   [x] Swelling in legs   [] Varicose veins   [x] Non-healing ulcers Pulmonary:   [] Uses home oxygen   [] Productive cough   [] Hemoptysis   [] Wheeze  [] COPD   [] Asthma Neurologic:  [] Dizziness   [] Seizures   [] History of stroke   [] History of TIA  [] Aphasia   [] Vissual changes   [] Weakness or numbness in arm   [x] Weakness or numbness in leg Musculoskeletal:   [] Joint swelling   [] Joint pain   [] Low back pain Hematologic:  [] Easy bruising  [] Easy bleeding    [] Hypercoagulable state   [] Anemic Gastrointestinal:  [] Diarrhea   [] Vomiting  [] Gastroesophageal reflux/heartburn   [] Difficulty swallowing. Genitourinary:  [] Chronic kidney disease   [] Difficult urination  [] Frequent urination   [] Blood in urine Skin:  [] Rashes   [x] Ulcers  Psychological:  [] History of anxiety   []  History of major depression.     Objective:   Physical Exam  BP 102/67 (BP Location: Right Arm)   Pulse 90   Resp 16   Ht 5\' 4"  (1.626 m)   Wt 175 lb (79.4 kg)   BMI 30.04 kg/m   Past Medical History:  Diagnosis Date  . Allergy   . Arthritis   . Cataract    Bilateral  . Essential hypertension 05/02/2014  . GERD (gastroesophageal reflux disease)   . Headache   . History of shingles   . HLD (hyperlipidemia) 05/02/2014  . Hyperlipidemia   . Hypertension   . Neuropathic pain 05/02/2014  . Osteoarthritis of both shoulders due to rotator cuff injury 04/23/2016  . Poor mobility 08/28/2015  . 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 Spinal stenosis of lumbar region 05/02/2014   S/p decompression at [facility] November 2015      Gen: WD/WN, NAD Head: [place]/AT, No temporalis wasting.  Ear/Nose/Throat: Hearing grossly intact, nares w/o erythema or drainage, poor dentition Eyes: PER, EOMI, sclera nonicteric.  Neck: Supple, no masses.  No bruit or JVD.  Pulmonary:  Good air movement, clear to auscultation bilaterally, no use of accessory muscles.  Cardiac: RRR, normal S1, S2, no Murmurs. Vascular: Large varicosities non-present, .Mild venous stasis changes to the legs bilaterally.  1+ soft pitting edema Vessel Right Left  Radial 2+ 2+  PT 1+ 1+  Gastrointestinal: soft, non-distended. No guarding/no peritoneal signs.  Musculoskeletal: M/S 5/5 throughout.  No deformity or atrophy.  Neurologic: CN 2-12 intact. Pain and light touch intact in extremities.  Symmetrical.  Speech is fluent. Motor exam as listed above. Psychiatric: Judgment intact, Mood & affect appropriate  for pt's clinical situation. Dermatologic:  Bilateral ulcers noted.  No changes consistent with cellulitis. Lymph : No Cervical lymphadenopathy, significant skin changes of chronic lymphedema.   Social History   Socioeconomic History  . Marital status: Widowed    Spouse name: Not on file  . Number of children: 3  . Years of education: Not on file  . Highest education level: Not on file  Occupational History  . Occupation: Microbiologist  Social Needs  . Financial resource strain: Not on file  . Food insecurity:    Worry: Not on file    Inability: Not on file  . Transportation needs:    Medical: Not on file    Non-medical: Not on file  Tobacco Use  . Smoking status: Former Smoker    Packs/day: 1.00    Years: 20.00    Pack years: 20.00    Types: Cigarettes    Last attempt to quit: 04/16/1967    Years since quitting: 50.8  . Smokeless tobacco: Never Used  . Tobacco comment: quit somking in early  1970's  Substance and Sexual Activity  . Alcohol use: No    Alcohol/week: 0.0 oz  . Drug use: No  . Sexual activity: Not Currently  Lifestyle  . Physical activity:    Days per week: Not on file    Minutes per session: Not on file  . Stress: Not on file  Relationships  . Social connections:    Talks on phone: Not on file    Gets together: Not on file    Attends religious service: Not on file    Active member of club or organization: Not on file    Attends meetings of clubs or organizations: Not on file    Relationship status: Not on file  . Intimate partner violence:    Fear of current or ex partner: Not on file    Emotionally abused: Not on file    Physically abused: Not on file    Forced sexual activity: Not on file  Other Topics Concern  . Not on file  Social History Narrative  . Not on file    Past Surgical History:  Procedure Laterality Date  . ANKLE SURGERY Left 2014  . BACK SURGERY  2015   [facility]   . BUNIONECTOMY Right   . COLONOSCOPY     . EYE SURGERY     cataract  . JOINT REPLACEMENT    . REVERSE SHOULDER ARTHROPLASTY Right 01/11/2016   Procedure: REVERSE SHOULDER ARTHROPLASTY;  Surgeon: Justice Britain, MD;  Location: [place];  Service: Orthopedics;  Laterality: Right;  . SHOULDER SURGERY  01/11/2016   REVERSE SHOULDER ARTHROPLASTY on 01/11/2016  . SPINE SURGERY     done at [facility] Apr 26, 2014, lumbar decompression  . TOTAL HIP ARTHROPLASTY Bilateral 2005, 2009   [place] , [place]    Family History  Problem Relation Age of Onset  .  Arthritis Mother        osteoarthritis  . Colon cancer Mother   . Arthritis Father        osteoarthritis  . Parkinson's disease Father   . Anesthesia problems Neg Hx     Allergies  Allergen Reactions  . No Known Allergies        Assessment & Plan:     Current Outpatient Medications on File Prior to Visit  Medication Sig Dispense Refill  . atorvastatin (LIPITOR) 10 MG tablet Take 1 tablet (10 mg total) by mouth at bedtime. For high cholesterol 90 tablet 1  . celecoxib (CELEBREX) 200 MG capsule Take 1 capsule (200 mg total) by mouth daily. 100 capsule 0  . Cholecalciferol (VITAMIN D3) 5000 units CAPS Take 5,000 Units by mouth at bedtime.     . famotidine (PEPCID) 10 MG tablet Take 10 mg by mouth at bedtime.     . furosemide (LASIX) 40 MG tablet Take 1 tablet (40 mg total) by mouth daily. 90 tablet 1  . gabapentin (NEURONTIN) 100 MG capsule Take 1 capsule (100 mg total) by mouth 3 (three) times daily. 270 capsule 1  . lidocaine (XYLOCAINE) 4 % external solution Apply topically daily as needed for mild pain or moderate pain (shoulders, neck and back pain).     Marland Kitchen losartan (COZAAR) 100 MG tablet Take 1 tablet (100 mg total) by mouth daily. 90 tablet 1  . Misc. Devices [facility]) MISC Use as directed 1 each 0  . montelukast (SINGULAIR) 10 MG tablet Take 1 tablet (10 mg total) by mouth at bedtime. 90 tablet 1  . Multiple Vitamins-Minerals (CENTRUM SILVER) tablet Take 1 tablet by mouth  daily.    . NONFORMULARY OR COMPOUNDED ITEM Lift recliner #1  As directed  Dx spinal stenosis, osteoarthritis both shoulder ,  Low ext weakness 1 each 0  . OVER THE COUNTER MEDICATION 1 application 2 (two) times daily as needed. Lemon Salve ( topical hemp extract cream    . pantoprazole (PROTONIX) 40 MG tablet Take 1 tablet (40 mg total) by mouth daily. 90 tablet 1  . SANTYL ointment Apply 1 application topically daily.  3  . senna-docusate (SENOKOT-S) 8.6-50 MG per tablet Take 1 tablet by mouth at bedtime. For constipation     . Simethicone (GAS RELIEF 80 PO) Take 1 tablet daily as needed by mouth (for bloating).    . traMADol (ULTRAM) 50 MG tablet Take 1 tablet (50 mg total) by mouth 3 (three) times daily. 270 tablet 1   No current facility-administered medications on file prior to visit.   Ms. Son returns to the office for followup evaluation regarding leg swelling and wound formation. The patient was seen with multiple family members, including her son.  The swelling has persisted and the pain associated with swelling continues. She has been visiting the wound center to have her bilateral wounds attended to.  Since her last visit she is also since developed new ulcers as well.  Since the previous visit the patient has been wearing graduated compression stockings and has noted little if any improvement in the lymphedema. The patient has been using compression routinely morning until night.  The patient also states elevation during the day and exercise is being done too.  She underwent a venous reflux study, which was notable for reflux in the bilateral GSV, femoral and popliteal veins, as well as SSV.   The patient also underwent an ABI screening on  12/18/2017, which revealed an ABI  of 0.98 on both the right and left lower extremities.   1. Lymphedema-New Recommend:  No surgery or intervention at this point in time.    I have reviewed my previous discussion with the patient regarding  swelling and why it causes symptoms.  Patient will continue wearing graduated compression stockings class 1 (20-30 mmHg) on a daily basis. The patient will  beginning wearing the stockings first thing in the morning and removing them in the evening. The patient is instructed specifically not to sleep in the stockings.    In addition, behavioral modification including several periods of elevation of the lower extremities during the day will be continued.  This was reviewed with the patient during the initial visit.  The patient will also continue to as active as physically possible on a daily basis as was discussed during the initial visit.    Despite conservative treatments including graduated compression therapy class 1 and behavioral modification including exercise and elevation the patient  has not obtained adequate control of the lymphedema.  The patient still has stage 2 lymphedema and therefore, I believe that a lymph pump should be added to improve the control of the patient's lymphedema.  Additionally, a lymph pump is warranted because it will reduce the risk of cellulitis and decrease continued ulceration in the future. The patient has experienced worsened ulceration due to extensive, life style altering edema.  Patient should follow-up in three months    2. Chronic venous insufficiency-New See as above  3. Ulcer of left lower extremity, unspecified ulcer stage (HCC)-Stable Currently managed by wound clinic There are no Patient Instructions on file for this visit. No follow-ups on file.   Kris Hartmann, NP

## 2018-01-21 ENCOUNTER — Telehealth (INDEPENDENT_AMBULATORY_CARE_PROVIDER_SITE_OTHER): Payer: Self-pay

## 2018-01-21 ENCOUNTER — Telehealth: Payer: Self-pay | Admitting: Family Medicine

## 2018-01-21 ENCOUNTER — Encounter (INDEPENDENT_AMBULATORY_CARE_PROVIDER_SITE_OTHER): Payer: Self-pay | Admitting: Nurse Practitioner

## 2018-01-21 DIAGNOSIS — M19112 Post-traumatic osteoarthritis, left shoulder: Secondary | ICD-10-CM | POA: Diagnosis not present

## 2018-01-21 DIAGNOSIS — M48061 Spinal stenosis, lumbar region without neurogenic claudication: Secondary | ICD-10-CM | POA: Diagnosis not present

## 2018-01-21 DIAGNOSIS — S81802D Unspecified open wound, left lower leg, subsequent encounter: Secondary | ICD-10-CM | POA: Diagnosis not present

## 2018-01-21 DIAGNOSIS — Z96619 Presence of unspecified artificial shoulder joint: Secondary | ICD-10-CM | POA: Diagnosis not present

## 2018-01-21 DIAGNOSIS — M19111 Post-traumatic osteoarthritis, right shoulder: Secondary | ICD-10-CM | POA: Diagnosis not present

## 2018-01-21 DIAGNOSIS — I10 Essential (primary) hypertension: Secondary | ICD-10-CM | POA: Diagnosis not present

## 2018-01-21 NOTE — Telephone Encounter (Signed)
Patient's son called and asked why the same Ultrasound was ordered that was already performed at [facility]?  He thought that the reason for this appointment on yesterday was to follow-up on the findings form the [place] [facility]. He is a bit confused and would like to know what's going on?

## 2018-01-21 NOTE — Telephone Encounter (Signed)
Please touch base with the provider who saw patient. Thank you.

## 2018-01-21 NOTE — Telephone Encounter (Signed)
Copied from [place] [id number]. Topic: Quick Communication - Rx Refill/Question >> Jan 21, 2018  1:58 PM Burchel, Abbi R wrote: Medication: celecoxib (CELEBREX) 200 MG capsule  [place] Patient Assistance [employer]  [place] states she needs v/o tp refill this rx for pt.  Please reference Pt ID # when returning call.     (Pt FP:[phone])

## 2018-01-21 NOTE — Progress Notes (Signed)
Subjective: 84 y.o. returns the office today for painful, elongated, thickened toenails which they cannot trim themself. Denies any redness or drainage around the nails.  Since I last saw her she developed wounds on her left leg and she is been going to the wound care center for this in.  Also she developed a small wound on her left second toe where she had a corn previously that she has been seen the wound care center for as well.  She denies any drainage or pus to the wound on her foot.  She also has developed a wound on the back of her left heel.  She is been using offloading pad.  Denies any acute changes since last appointment and no new complaints today. Denies any systemic complaints such as fevers, chills, nausea, vomiting.   PCP: Carollee Herter, [name], DO  Objective: AAO 3, NAD DP/PT pulses palpable, CRT less than 3 seconds Nails hypertrophic, dystrophic, elongated, brittle, discolored 10. There is tenderness overlying the nails 1-5 bilaterally. There is no surrounding erythema or drainage along the nail sites. Large dry eschar present to the posterior aspect left heel without any fluctuation or crepitation there is no surrounding erythema, ascending sialitis.  On the dorsal aspect the left second toe on the PIPJ is a very small superficial granular wound but there is no edema, erythema, drainage or pus or signs of infection. No open lesions or pre-ulcerative lesions are identified. No other areas of tenderness bilateral lower extremities. No overlying edema, erythema, increased warmth. No pain with calf compression, swelling, warmth, erythema.  Assessment: Patient presents with symptomatic onychomycosis; multiple ulcerations  Plan: -Treatment options including alternatives, risks, complications were discussed -Nails sharply debrided 10 without complication/bleeding. -Multiple ulcerations are present she is currently under the active care of the wound care center.  Will defer  treatment to them.  Recommend offloading all times and also keep a pillow so her heels do not touch her bed or chair.  There is any signs or symptoms of infection she is to immediately emergency room. -Discussed daily foot inspection. If there are any changes, to call the office immediately.  -Follow-up as scheduled or sooner if any problems are to arise. In the meantime, encouraged to call the office with any questions, concerns, changes symptoms.  Celesta Gentile, DPM

## 2018-01-22 ENCOUNTER — Telehealth (INDEPENDENT_AMBULATORY_CARE_PROVIDER_SITE_OTHER): Payer: Self-pay | Admitting: Nurse Practitioner

## 2018-01-22 ENCOUNTER — Encounter: Payer: Self-pay | Admitting: *Deleted

## 2018-01-22 ENCOUNTER — Other Ambulatory Visit: Payer: Self-pay | Admitting: *Deleted

## 2018-01-22 ENCOUNTER — Telehealth: Payer: Self-pay

## 2018-01-22 DIAGNOSIS — M19112 Post-traumatic osteoarthritis, left shoulder: Secondary | ICD-10-CM | POA: Diagnosis not present

## 2018-01-22 DIAGNOSIS — M48061 Spinal stenosis, lumbar region without neurogenic claudication: Secondary | ICD-10-CM | POA: Diagnosis not present

## 2018-01-22 DIAGNOSIS — I10 Essential (primary) hypertension: Secondary | ICD-10-CM | POA: Diagnosis not present

## 2018-01-22 DIAGNOSIS — S81802D Unspecified open wound, left lower leg, subsequent encounter: Secondary | ICD-10-CM | POA: Diagnosis not present

## 2018-01-22 DIAGNOSIS — M19111 Post-traumatic osteoarthritis, right shoulder: Secondary | ICD-10-CM | POA: Diagnosis not present

## 2018-01-22 DIAGNOSIS — Z96619 Presence of unspecified artificial shoulder joint: Secondary | ICD-10-CM | POA: Diagnosis not present

## 2018-01-22 NOTE — Telephone Encounter (Signed)
Spoke with extensively with NP at wound care center, after speaking about our differences in studies, as well as results of ABIs and duplex studies.  The NP agrees that the best course of action would likely be an angio, due to the fact that the wounds are getting progressively worse.  I called to speak with the patient's son, her caretaker, about the need for the angioplasty procedure.  I spent in extensive amount of time explaining peripheral artery disease, as well as the pathophysiology of atherosclerosis and how good blood flow is necessary for wound healing.  I explained the procedure risk and benefits.  I spoke about the postprocedural course, as well as what could be expected.  The son stated that he would speak directly to the patient, and determine what she wanted to do.  I stated that he could call me back and let me know what their decision was, and if they chose to proceed we would have our scheduling coordinator reach out with a date and time for the procedure.

## 2018-01-22 NOTE — Telephone Encounter (Signed)
On 01/21/2018, I spoke with the patient's son [name] in regards to following up with the wound center.  I let him know that I contact the wound center, however the provider was out of the office for the day and that I would contact them first thing the next morning.  On 01/22/2018, I contacted [facility] wound center and spoke with Raquel Sarna in regards to the need for the patient to go to [facility] for an arterial duplex, following ABIs done on 6/27 at [facility]  I also spoke with her about the results of the venous reflux study.  Raquel Sarna stated that she would speak with the provider and make the determination as to whether the patient should keep this appointment, and stated that she would call the patient's son directly when she is spoken with the provider.

## 2018-01-22 NOTE — Telephone Encounter (Signed)
I spoke with the Wound Clinic this morning and explained the situation to the Dr's nurse Raquel Sarna, as well as the results of the ABI.  Dr. Cheri Rous and the NP were both seeing patients, so she said that she would speak with her.  She stated that she will call [name] directly with how he should proceed with the appointment tomorrow.  I also explained the transportation information and that he will need to be notified early to cancel the transportation for his mom if necessary. Thanks!!

## 2018-01-22 NOTE — Telephone Encounter (Signed)
I don't see where see where we ordered a doppler--- but if she had one already and she is seeing vascular for this now it probably does not need to be done

## 2018-01-22 NOTE — Patient Outreach (Signed)
[place] [facility]) Care Management  01/22/2018  Katherine Owen [birth year] [id number]   CSW attempted to reach patient/family today and was unsuccessful reaching by phone. CSW left a HIPPA compliant voice message and will attempt outreach call again in 3-4 business days if no return call is received.  CSW will send an unsuccessful outreach letter to patient as well.    Eduard Clos, MSW, [place] Worker  [place] [phone]

## 2018-01-22 NOTE — Progress Notes (Signed)
Katherine Owen ([id number]) Visit Report for 01/15/2018 Arrival Information Details Patient Name: Katherine Owen Date of Service: 01/15/2018 1:15 PM Medical Record Number: [id number] Patient Account Number: [id number] Date of Birth/Sex: [birth year] (84 y.o. Female) Treating RN: Carolyne Fiscal, Debi Primary Care Caliana Spires: Carollee Herter, Kendrick Fries Other Clinician: Referring Hamp Moreland: Carollee Herter, Kendrick Fries Treating Jakayden Cancio/Extender: Cathie Olden in Treatment: 4 Visit Information History Since Last Visit All ordered tests and consults were completed: No Patient Arrived: Wheel Chair Added or deleted any medications: No Arrival Time: 13:12 Any new allergies or adverse reactions: No Accompanied By: son and daughter in law Had a fall or experienced change in No activities of daily living that may affect Transfer Assistance: Harrel Lemon Lift risk of falls: Patient Identification Verified: Yes Signs or symptoms of abuse/neglect since last visito No Secondary Verification Process Yes Hospitalized since last visit: No Completed: Implantable device outside of the clinic excluding No Patient Requires Transmission-Based No cellular tissue based products placed in the center Precautions: since last visit: Patient Has Alerts: No Has Dressing in Place as Prescribed: Yes Pain Present Now: No Electronic Signature(s) Signed: 01/19/2018 9:01:15 AM By: Alric Quan Previous Signature: 01/19/2018 9:00:49 AM Version By: Alric Quan Entered By: Alric Quan on 01/19/2018 [name], [name] ([id number]) -------------------------------------------------------------------------------- Encounter Discharge Information Details Patient Name: Katherine Owen Date of Service: 01/15/2018 1:15 PM Medical Record Number: [id number] Patient Account Number: [id number] Date of Birth/Sex: June 02, 1928 (84 y.o. Female) Treating RN: Carolyne Fiscal, Debi Primary Care Zavian Slowey: Carollee Herter, Kendrick Fries Other  Clinician: Referring Elsworth Ledin: Carollee Herter, Kendrick Fries Treating Jowana Thumma/Extender: Cathie Olden in Treatment: 4 Encounter Discharge Information Items Discharge Condition: Stable Ambulatory Status: Wheelchair Discharge Destination: Home Transportation: Private Auto Accompanied By: son and daughter in law Schedule Follow-up Appointment: Yes Clinical Summary of Care: Electronic Signature(s) Signed: 01/19/2018 9:18:14 AM By: Alric Quan Entered By: Alric Quan on 01/19/2018 09:18:14 Mom, [name] ([id number]) -------------------------------------------------------------------------------- General Visit Notes Details Patient Name: Katherine Owen Date of Service: 01/15/2018 1:15 PM Medical Record Number: [id number] Patient Account Number: [id number] Date of Birth/Sex: [birth year] (84 y.o. Female) Treating RN: Carolyne Fiscal, Debi Primary Care Jacoby Ritsema: Carollee Herter, Kendrick Fries Other Clinician: Referring Charnele Semple: Carollee Herter, Kendrick Fries Treating Octivia Canion/Extender: Cathie Olden in Treatment: 4 Notes Documentation was put in late d/t i-heal being down. Electronic Signature(s) Signed: 01/19/2018 9:19:30 AM By: Alric Quan Entered By: Alric Quan on 01/19/2018 09:19:30 [name], [name] ([id number]) -------------------------------------------------------------------------------- Lower Extremity Assessment Details Patient Name: Katherine Owen Date of Service: 01/15/2018 1:15 PM Medical Record Number: [id number] Patient Account Number: [id number] Date of Birth/Sex: 1928/06/10 (84 y.o. Female) Treating RN: Carolyne Fiscal, Debi Primary Care Pelham Hennick: Carollee Herter, Kendrick Fries Other Clinician: Referring Jaidin Ugarte: Carollee Herter, Kendrick Fries Treating Keara Pagliarulo/Extender: Cathie Olden in Treatment: 4 Vascular Assessment Pulses: Dorsalis Pedis Palpable: [Left:Yes] [Right:Yes] Posterior Tibial Extremity colors, hair growth, and conditions: Extremity Color: [Left:Normal]  [Right:Normal] Temperature of Extremity: [Left:Warm] [Right:Warm] Capillary Refill: [Left:< 3 seconds] [Right:< 3 seconds] Toe Nail Assessment Left: Right: Thick: Yes Yes Discolored: No No Deformed: No No Improper Length and Hygiene: Yes Yes Electronic Signature(s) Signed: 01/19/2018 9:07:38 AM By: Alric Quan Entered By: Alric Quan on 01/19/2018 09:07:37 [name], [name] ([id number]) -------------------------------------------------------------------------------- Multi Wound Chart Details Patient Name: Katherine Owen Date of Service: 01/15/2018 1:15 PM Medical Record Number: [id number] Patient Account Number: [id number] Date of Birth/Sex: [birth year] (84 y.o. Female) Treating RN: Carolyne Fiscal, Debi Primary Care Truett Mcfarlan: Carollee Herter, Kendrick Fries Other Clinician: Referring Merrie Epler: Carollee Herter, Kendrick Fries Treating Oliviya Gilkison/Extender: Cathie Olden in Treatment: 4 Vital Signs Height(in): Pulse(bpm): 88 Weight(lbs): Blood Pressure(mmHg): 122/72 Body Mass Index(BMI): Temperature(F): 98.4  Respiratory Rate 18 (breaths/min): Photos: [1:No Photos] [2:No Photos] [3:No Photos] Wound Location: [1:Left Lower Leg - Lateral] [2:Left Toe Second] [3:Right Lower Leg - Posterior] Wounding Event: [1:Gradually Appeared] [2:Gradually Appeared] [3:Pressure Injury] Primary Etiology: [1:Venous Leg Ulcer] [2:Arterial Insufficiency Ulcer] [3:Pressure Ulcer] Comorbid History: [1:Cataracts, Hypertension, Osteoarthritis, Neuropathy] [2:Cataracts, Hypertension, Osteoarthritis, Neuropathy] [3:Cataracts, Hypertension, Osteoarthritis, Neuropathy] Date Acquired: [1:11/17/2017] [2:09/15/2017] [3:12/18/2017] Weeks of Treatment: [1:4] [2:4] [3:4] Wound Status: [1:Open] [2:Open] [3:Open] Measurements L x W x D [1:3.4x2.5x0.5] [2:0.2x0.2x0.1] [3:2.3x2.4x0.1] (cm) Area (cm) : [1:6.676] [2:0.031] [3:4.335] Volume (cm) : [1:3.338] [2:0.003] [3:0.434] % Reduction in Area: [1:-72.40%] [2:-287.50%]  [3:-1280.60%] % Reduction in Volume: [1:-762.50%] [2:-200.00%] [3:-1300.00%] Classification: [1:Full Thickness Without Exposed Support Structures] [2:Full Thickness Without Exposed Support Structures] [3:Category/Stage II] Exudate Amount: [1:Medium] [2:Small] [3:Medium] Exudate Type: [1:Serous] [2:Serous] [3:Serosanguineous] Exudate Color: [1:amber] [2:amber] [3:red, brown] Wound Margin: [1:Flat and Intact] [2:Flat and Intact] [3:Flat and Intact] Granulation Amount: [1:Small (1-33%)] [2:Large (67-100%)] [3:Small (1-33%)] Granulation Quality: [1:Pink] [2:Pink] [3:Pink] Necrotic Amount: [1:Large (67-100%)] [2:Small (1-33%)] [3:Large (67-100%)] Necrotic Tissue: [1:Eschar, Adherent Slough] [2:Adherent Slough] [3:Eschar] Exposed Structures: [1:Fat Layer (Subcutaneous Tissue) Exposed: Yes Fascia: No Tendon: No Muscle: No Joint: No Bone: No] [2:Fat Layer (Subcutaneous Tissue) Exposed: Yes Fascia: No Tendon: No Muscle: No Joint: No Bone: No] [3:Fascia: No Fat Layer (Subcutaneous Tissue)  Exposed: No Tendon: No Muscle: No Joint: No Bone: No] Epithelialization: [1:None] [2:None] [3:None] Periwound Skin Texture: [1:Excoriation: No Induration: No Callus: No Crepitus: No Rash: No Scarring: No] [2:Excoriation: No Induration: No Callus: No Crepitus: No Rash: No Scarring: No] [3:No Abnormalities Noted] Periwound Skin Moisture: [1:Maceration: No Dry/Scaly: No] [2:Maceration: No Dry/Scaly: No] [3:No Abnormalities Noted] Periwound Skin Color: [1:Atrophie Blanche: No Cyanosis: No Ecchymosis: No Erythema: No Hemosiderin Staining: No Mottled: No] [2:Atrophie Blanche: No Cyanosis: No Ecchymosis: No Erythema: No Hemosiderin Staining: No Mottled: No] [3:No Abnormalities Noted] Pallor: No Pallor: No Rubor: No Rubor: No Temperature: No Abnormality No Abnormality No Abnormality Tenderness on Palpation: Yes Yes Yes Wound Preparation: Ulcer Cleansing: Ulcer Cleansing: Ulcer Cleansing: Rinsed/Irrigated with Saline  Rinsed/Irrigated with Saline Rinsed/Irrigated with Saline Topical Anesthetic Applied: Topical Anesthetic Applied: Topical Anesthetic Applied: Other: lidocaine 4% Other: lidocaine 4% Other: lidocaine 4% Wound Number: 4 5 N/A Photos: No Photos No Photos N/A Wound Location: Left Calcaneus Coccyx N/A Wounding Event: Pressure Injury Pressure Injury N/A Primary Etiology: Pressure Ulcer Pressure Ulcer N/A Comorbid History: Cataracts, Hypertension, Cataracts, Hypertension, N/A Osteoarthritis, Neuropathy Osteoarthritis, Neuropathy Date Acquired: 12/28/2017 12/18/2017 N/A Weeks of Treatment: 2 2 N/A Wound Status: Open Open N/A Measurements L x W x D 4.5x8.2x0.1 1.3x1.4x0.4 N/A (cm) Area (cm) : 28.981 1.429 N/A Volume (cm) : 2.898 0.572 N/A % Reduction in Area: -82.30% 70.90% N/A % Reduction in Volume: -82.30% -16.50% N/A Classification: Unstageable/Unclassified Category/Stage III N/A Exudate Amount: None Present Medium N/A Exudate Type: N/A Serosanguineous N/A Exudate Color: N/A red, brown N/A Wound Margin: Distinct, outline attached Distinct, outline attached N/A Granulation Amount: None Present (0%) None Present (0%) N/A Granulation Quality: N/A N/A N/A Necrotic Amount: Large (67-100%) Large (67-100%) N/A Necrotic Tissue: Eschar Adherent Slough N/A Exposed Structures: N/A N/A N/A Epithelialization: None None N/A Periwound Skin Texture: No Abnormalities Noted No Abnormalities Noted N/A Periwound Skin Moisture: No Abnormalities Noted No Abnormalities Noted N/A Periwound Skin Color: No Abnormalities Noted No Abnormalities Noted N/A Temperature: N/A N/A N/A Tenderness on Palpation: No No N/A Wound Preparation: Ulcer Cleansing: Ulcer Cleansing: N/A Rinsed/Irrigated with Saline Rinsed/Irrigated with Saline Topical Anesthetic Applied: Topical Anesthetic Applied: None Other: lidocaine 4% Treatment Notes Electronic Signature(s)  Signed: 01/19/2018 9:07:58 AM By: Alric Quan Entered  By: Alric Quan on 01/19/2018 09:07:58 Jarriel, [name] ([id number]) -------------------------------------------------------------------------------- Multi-Disciplinary Care Plan Details Patient Name: Katherine Owen Date of Service: 01/15/2018 1:15 PM Medical Record Number: [id number] Patient Account Number: [id number] Date of Birth/Sex: [birth year] (84 y.o. Female) Treating RN: Carolyne Fiscal, Debi Primary Care Elliannah Wayment: Carollee Herter, Kendrick Fries Other Clinician: Referring Makaleigh Reinard: Carollee Herter, Kendrick Fries Treating Annaly Skop/Extender: Cathie Olden in Treatment: 4 Active Inactive ` Abuse / Safety / Falls / Self Care Management Nursing Diagnoses: Potential for falls Goals: Patient will not experience any injury related to falls Date Initiated: 12/18/2017 Target Resolution Date: 03/28/2018 Goal Status: Active Interventions: Assess Activities of Daily Living upon admission and as needed Assess fall risk on admission and as needed Assess: immobility, friction, shearing, incontinence upon admission and as needed Assess impairment of mobility on admission and as needed per policy Assess personal safety and home safety (as indicated) on admission and as needed Notes: ` Nutrition Nursing Diagnoses: Imbalanced nutrition Goals: Patient/caregiver agrees to and verbalizes understanding of need to use nutritional supplements and/or vitamins as prescribed Date Initiated: 12/18/2017 Target Resolution Date: 03/28/2018 Goal Status: Active Interventions: Assess patient nutrition upon admission and as needed per policy Notes: ` Orientation to the Wound Care Program Nursing Diagnoses: Knowledge deficit related to the wound healing center program Goals: Patient/caregiver will verbalize understanding of the [facility] Date Initiated: 12/18/2017 Target Resolution Date: 12/27/2017 Goal Status: Active Interventions: Provide education on orientation to the wound  center Notes: Kennidy Lamke, Zareah ([id number]) Pain, Acute or Chronic Nursing Diagnoses: Pain, acute or chronic: actual or potential Potential alteration in comfort, pain Goals: Patient/caregiver will verbalize adequate pain control between visits Date Initiated: 12/18/2017 Target Resolution Date: 03/28/2018 Goal Status: Active Interventions: Complete pain assessment as per visit requirements Notes: ` Wound/Skin Impairment Nursing Diagnoses: Impaired tissue integrity Knowledge deficit related to ulceration/compromised skin integrity Goals: Ulcer/skin breakdown will have a volume reduction of 80% by week 12 Date Initiated: 12/18/2017 Target Resolution Date: 03/28/2018 Goal Status: Active Interventions: Assess patient/caregiver ability to perform ulcer/skin care regimen upon admission and as needed Assess ulceration(s) every visit Notes: Electronic Signature(s) Signed: 01/19/2018 9:07:46 AM By: Alric Quan Entered By: Alric Quan on 01/19/2018 09:07:45 Hinote, Erian ([id number]) -------------------------------------------------------------------------------- Pain Assessment Details Patient Name: Katherine Owen Date of Service: 01/15/2018 1:15 PM Medical Record Number: [id number] Patient Account Number: [id number] Date of Birth/Sex: Jun 15, 1928 (84 y.o. Female) Treating RN: Carolyne Fiscal, Debi Primary Care Sharesa Kemp: Carollee Herter, Kendrick Fries Other Clinician: Referring Calem Cocozza: Carollee Herter, Kendrick Fries Treating Montoya Watkin/Extender: Cathie Olden in Treatment: 4 Active Problems Location of Pain Severity and Description of Pain Patient Has Paino No Site Locations Pain Management and Medication Current Pain Management: Electronic Signature(s) Signed: 01/19/2018 9:00:57 AM By: Alric Quan Entered By: Alric Quan on 01/19/2018 09:00:56 Punch, [name] ([id number]) -------------------------------------------------------------------------------- Patient/Caregiver  Education Details Patient Name: Katherine Owen Date of Service: 01/15/2018 1:15 PM Medical Record Number: [id number] Patient Account Number: [id number] Date of Birth/Gender: [birth year] (84 y.o. Female) Treating RN: Carolyne Fiscal, Debi Primary Care Physician: Roma Schanz Other Clinician: Referring Physician: Roma Schanz Treating Physician/Extender: Cathie Olden in Treatment: 4 Education Assessment Education Provided To: Patient Education Topics Provided Wound/Skin Impairment: Handouts: Caring for Your Ulcer, Skin Care Do's and Dont's, Other: change dressing as ordered Methods: Demonstration, Explain/Verbal Responses: State content correctly Electronic Signature(s) Unsigned Entered By: Alric Quan on 01/19/2018 09:18:41 Signature(s): Date(s): Katherine Owen ([id number]) -------------------------------------------------------------------------------- Wound Assessment Details Patient Name: DIVINE, IMBER Date of Service: 01/15/2018 1:15 PM Medical Record  Number: [id number] Patient Account Number: [id number] Date of Birth/Sex: Oct 14, 1927 (84 y.o. Female) Treating RN: Carolyne Fiscal, Debi Primary Care Taiesha Bovard: Carollee Herter, Kendrick Fries Other Clinician: Referring Majorie Santee: Carollee Herter, Kendrick Fries Treating Mykenzie Ebanks/Extender: Cathie Olden in Treatment: 4 Wound Status Wound Number: 1 Primary Venous Leg Ulcer Etiology: Wound Location: Left Lower Leg - Lateral Wound Status: Open Wounding Event: Gradually Appeared Comorbid Cataracts, Hypertension, Osteoarthritis, Date Acquired: 11/17/2017 History: Neuropathy Weeks Of Treatment: 4 Clustered Wound: No Wound Measurements Length: (cm) 3.4 Width: (cm) 2.5 Depth: (cm) 0.5 Area: (cm) 6.676 Volume: (cm) 3.338 % Reduction in Area: -72.4% % Reduction in Volume: -762.5% Epithelialization: None Tunneling: No Undermining: No Wound Description Full Thickness Without Exposed Support Classification: Structures Wound  Margin: Flat and Intact Exudate Medium Amount: Exudate Type: Serous Exudate Color: amber Foul Odor After Cleansing: No Slough/Fibrino No Wound Bed Granulation Amount: Small (1-33%) Exposed Structure Granulation Quality: Pink Fascia Exposed: No Necrotic Amount: Large (67-100%) Fat Layer (Subcutaneous Tissue) Exposed: Yes Necrotic Quality: Eschar, Adherent Slough Tendon Exposed: No Muscle Exposed: No Joint Exposed: No Bone Exposed: No Periwound Skin Texture Texture Color No Abnormalities Noted: No No Abnormalities Noted: No Callus: No Atrophie Blanche: No Crepitus: No Cyanosis: No Excoriation: No Ecchymosis: No Induration: No Erythema: No Rash: No Hemosiderin Staining: No Scarring: No Mottled: No Pallor: No Moisture Rubor: No No Abnormalities Noted: No Dry / Scaly: No Temperature / Pain Maceration: No Temperature: No Abnormality Tenderness on Palpation: Yes Wound Preparation Ulcer Cleansing: Rinsed/Irrigated with Saline Topical Anesthetic Applied: Other: lidocaine 4%, Treatment Notes Maddocks, Yvonnie ([id number]) Wound #1 (Left, Lateral Lower Leg) 1. Cleansed with: Clean wound with Normal Saline 2. Anesthetic Topical Lidocaine 4% cream to wound bed prior to debridement 4. Dressing Applied: Santyl Ointment Saline moistened guaze 5. Secondary Dressing Applied Bordered Foam Dressing Dry Gauze Electronic Signature(s) Signed: 01/19/2018 9:02:39 AM By: Alric Quan Entered By: Alric Quan on 01/19/2018 09:02:39 Skibicki, Maryrose ([id number]) -------------------------------------------------------------------------------- Wound Assessment Details Patient Name: Katherine Owen Date of Service: 01/15/2018 1:15 PM Medical Record Number: [id number] Patient Account Number: [id number] Date of Birth/Sex: 05/09/1928 (84 y.o. Female) Treating RN: Carolyne Fiscal, Debi Primary Care Andrew Soria: Carollee Herter, Kendrick Fries Other Clinician: Referring Kendarious Gudino: Carollee Herter,  Kendrick Fries Treating Lorine Iannaccone/Extender: Cathie Olden in Treatment: 4 Wound Status Wound Number: 2 Primary Arterial Insufficiency Ulcer Etiology: Wound Location: Left Toe Second Wound Status: Open Wounding Event: Gradually Appeared Comorbid Cataracts, Hypertension, Osteoarthritis, Date Acquired: 09/15/2017 History: Neuropathy Weeks Of Treatment: 4 Clustered Wound: No Wound Measurements Length: (cm) 0.2 % Reduct Width: (cm) 0.2 % Reduct Depth: (cm) 0.1 Epitheli Area: (cm) 0.031 Tunneli Volume: (cm) 0.003 Undermi ion in Area: -287.5% ion in Volume: -200% alization: None ng: No ning: No Wound Description Full Thickness Without Exposed Support Foul Od Classification: Structures Slough/ Wound Margin: Flat and Intact Exudate Small Amount: Exudate Type: Serous Exudate Color: amber or After Cleansing: No Fibrino No Wound Bed Granulation Amount: Large (67-100%) Exposed Structure Granulation Quality: Pink Fascia Exposed: No Necrotic Amount: Small (1-33%) Fat Layer (Subcutaneous Tissue) Exposed: Yes Necrotic Quality: Adherent Slough Tendon Exposed: No Muscle Exposed: No Joint Exposed: No Bone Exposed: No Periwound Skin Texture Texture Color No Abnormalities Noted: No No Abnormalities Noted: No Callus: No Atrophie Blanche: No Crepitus: No Cyanosis: No Excoriation: No Ecchymosis: No Induration: No Erythema: No Rash: No Hemosiderin Staining: No Scarring: No Mottled: No Pallor: No Moisture Rubor: No No Abnormalities Noted: No Dry / Scaly: No Temperature / Pain Maceration: No Temperature: No Abnormality Tenderness on Palpation: Yes Wound Preparation Ulcer Cleansing: Rinsed/Irrigated with  Saline Topical Anesthetic Applied: Other: lidocaine 4%, Treatment Notes Steenson, Shyanna ([id number]) Wound #2 (Left Toe Second) 1. Cleansed with: Clean wound with Normal Saline 2. Anesthetic Topical Lidocaine 4% cream to wound bed prior to debridement 4. Dressing  Applied: Hydrafera Blue 5. Secondary Dressing Applied Dry Gauze Kerlix/Conform 7. Secured with Tape Notes boarder foam dressing to sacrum. Electronic Signature(s) Signed: 01/19/2018 9:03:15 AM By: Alric Quan Entered By: Alric Quan on 01/19/2018 09:03:14 Trammel, Skylyn ([id number]) -------------------------------------------------------------------------------- Wound Assessment Details Patient Name: Katherine Owen Date of Service: 01/15/2018 1:15 PM Medical Record Number: [id number] Patient Account Number: [id number] Date of Birth/Sex: [birth year] (84 y.o. Female) Treating RN: Carolyne Fiscal, Debi Primary Care Horice Carrero: Carollee Herter, Kendrick Fries Other Clinician: Referring Wyman Meschke: Carollee Herter, Kendrick Fries Treating Vonn Sliger/Extender: Cathie Olden in Treatment: 4 Wound Status Wound Number: 3 Primary Pressure Ulcer Etiology: Wound Location: Right Lower Leg - Posterior Wound Status: Open Wounding Event: Pressure Injury Comorbid Cataracts, Hypertension, Osteoarthritis, Date Acquired: 12/18/2017 History: Neuropathy Weeks Of Treatment: 4 Clustered Wound: No Wound Measurements Length: (cm) 2.3 Width: (cm) 2.4 Depth: (cm) 0.1 Area: (cm) 4.335 Volume: (cm) 0.434 % Reduction in Area: -1280.6% % Reduction in Volume: -1300% Epithelialization: None Tunneling: No Undermining: No Wound Description Classification: Category/Stage II Wound Margin: Flat and Intact Exudate Amount: Medium Exudate Type: Serosanguineous Exudate Color: red, brown Foul Odor After Cleansing: No Slough/Fibrino Yes Wound Bed Granulation Amount: Small (1-33%) Exposed Structure Granulation Quality: Pink Fascia Exposed: No Necrotic Amount: Large (67-100%) Fat Layer (Subcutaneous Tissue) Exposed: No Necrotic Quality: Eschar Tendon Exposed: No Muscle Exposed: No Joint Exposed: No Bone Exposed: No Periwound Skin Texture Texture Color No Abnormalities Noted: No No Abnormalities Noted: No Moisture  Temperature / Pain No Abnormalities Noted: No Temperature: No Abnormality Tenderness on Palpation: Yes Wound Preparation Ulcer Cleansing: Rinsed/Irrigated with Saline Topical Anesthetic Applied: Other: lidocaine 4%, Treatment Notes Wound #3 (Right, Posterior Lower Leg) 1. Cleansed with: Clean wound with Normal Saline 2. Anesthetic Topical Lidocaine 4% cream to wound bed prior to debridement 4. Dressing Applied: Santyl Ointment Saline moistened guaze 5. Secondary Dressing Applied Bordered Foam Dressing Mccrackin, Delana ([id number]) Dry Gauze Electronic Signature(s) Signed: 01/19/2018 9:04:06 AM By: Alric Quan Entered By: Alric Quan on 01/19/2018 09:04:06 Hilligoss, Karington ([id number]) -------------------------------------------------------------------------------- Wound Assessment Details Patient Name: Katherine Owen Date of Service: 01/15/2018 1:15 PM Medical Record Number: [id number] Patient Account Number: [id number] Date of Birth/Sex: Oct 17, 1927 (84 y.o. Female) Treating RN: Carolyne Fiscal, Debi Primary Care Finas Delone: Carollee Herter, Kendrick Fries Other Clinician: Referring Roberts Bon: Carollee Herter, Kendrick Fries Treating Navika Hoopes/Extender: Cathie Olden in Treatment: 4 Wound Status Wound Number: 4 Primary Pressure Ulcer Etiology: Wound Location: Left Calcaneus Wound Status: Open Wounding Event: Pressure Injury Comorbid Cataracts, Hypertension, Osteoarthritis, Date Acquired: 12/28/2017 History: Neuropathy Weeks Of Treatment: 2 Clustered Wound: No Wound Measurements Length: (cm) 4.5 Width: (cm) 8.2 Depth: (cm) 0.1 Area: (cm) 28.981 Volume: (cm) 2.898 % Reduction in Area: -82.3% % Reduction in Volume: -82.3% Epithelialization: None Tunneling: No Undermining: No Wound Description Classification: Unstageable/Unclassified Wound Margin: Distinct, outline attached Exudate Amount: None Present Foul Odor After Cleansing: No Slough/Fibrino No Wound Bed Granulation  Amount: None Present (0%) Necrotic Amount: Large (67-100%) Necrotic Quality: Eschar Periwound Skin Texture Texture Color No Abnormalities Noted: No No Abnormalities Noted: No Moisture No Abnormalities Noted: No Wound Preparation Ulcer Cleansing: Rinsed/Irrigated with Saline Topical Anesthetic Applied: None Treatment Notes Wound #4 (Left Calcaneus) 1. Cleansed with: Clean wound with Normal Saline 4. Dressing Applied: Other dressing (specify in notes) Notes betadine paint Electronic Signature(s) Signed: 01/19/2018 9:04:56 AM By: Carolyne Fiscal,  Debra Entered By: Alric Quan on 01/19/2018 09:04:56 Mcentee, Nashalie ([id number]) -------------------------------------------------------------------------------- Wound Assessment Details Patient Name: SHIRLY, BARTOSIEWICZ Date of Service: 01/15/2018 1:15 PM Medical Record Number: [id number] Patient Account Number: [id number] Date of Birth/Sex: 1927-10-15 (84 y.o. Female) Treating RN: Carolyne Fiscal, Debi Primary Care Merilyn Pagan: Carollee Herter, Kendrick Fries Other Clinician: Referring Santina Trillo: Carollee Herter, Kendrick Fries Treating Javia Dillow/Extender: Cathie Olden in Treatment: 4 Wound Status Wound Number: 5 Primary Pressure Ulcer Etiology: Wound Location: Coccyx Wound Status: Open Wounding Event: Pressure Injury Comorbid Cataracts, Hypertension, Osteoarthritis, Date Acquired: 12/18/2017 History: Neuropathy Weeks Of Treatment: 2 Clustered Wound: No Wound Measurements Length: (cm) 1.3 Width: (cm) 1.4 Depth: (cm) 0.4 Area: (cm) 1.429 Volume: (cm) 0.572 % Reduction in Area: 70.9% % Reduction in Volume: -16.5% Epithelialization: None Tunneling: No Undermining: No Wound Description Classification: Category/Stage III Wound Margin: Distinct, outline attached Exudate Amount: Medium Exudate Type: Serosanguineous Exudate Color: red, brown Foul Odor After Cleansing: No Slough/Fibrino Yes Wound Bed Granulation Amount: None Present (0%) Necrotic  Amount: Large (67-100%) Necrotic Quality: Adherent Slough Periwound Skin Texture Texture Color No Abnormalities Noted: No No Abnormalities Noted: No Moisture No Abnormalities Noted: No Wound Preparation Ulcer Cleansing: Rinsed/Irrigated with Saline Topical Anesthetic Applied: Other: lidocaine 4%, Treatment Notes Wound #5 (Coccyx) 1. Cleansed with: Clean wound with Normal Saline 2. Anesthetic Topical Lidocaine 4% cream to wound bed prior to debridement 4. Dressing Applied: Santyl Ointment Saline moistened guaze 5. Secondary Dressing Applied Bordered Foam Dressing Dry Gauze Notes boarder foam dressing to sacrum. Electronic Signature(s) Bostock, Heavin ([id number]) Signed: 01/19/2018 9:06:48 AM By: Alric Quan Entered By: Alric Quan on 01/19/2018 09:06:48 Fluckiger, [name] ([id number]) -------------------------------------------------------------------------------- Vitals Details Patient Name: Katherine Owen Date of Service: 01/15/2018 1:15 PM Medical Record Number: [id number] Patient Account Number: [id number] Date of Birth/Sex: 03-30-1928 (84 y.o. Female) Treating RN: Carolyne Fiscal, Debi Primary Care Journey [place]: Carollee Herter, Kendrick Fries Other Clinician: Referring German Manke: Carollee Herter, Kendrick Fries Treating Samhita Kretsch/Extender: Cathie Olden in Treatment: 4 Vital Signs Time Taken: 13:14 Temperature (F): 98.4 Pulse (bpm): 88 Respiratory Rate (breaths/min): 18 Blood Pressure (mmHg): 122/72 Reference Range: 80 - 120 mg / dl Electronic Signature(s) Signed: 01/19/2018 9:01:39 AM By: Alric Quan Entered By: Alric Quan on 01/19/2018 09:01:38

## 2018-01-22 NOTE — Telephone Encounter (Signed)
NP from vein vascular at [facility] calling on behalf of pt., as pt. Is wondering if she still needs to get a duplex study performed, scheduled for 8/2 apparently. Pt. recently had US arterial ABI, both right and left resulting as 0.98. Author to route to Dr. Etter Sjogren to advise and will call son, Antony Haste, back at [phone] per request to notify of Dr. Nonda Lou response.

## 2018-01-23 ENCOUNTER — Encounter (INDEPENDENT_AMBULATORY_CARE_PROVIDER_SITE_OTHER): Payer: Self-pay

## 2018-01-23 DIAGNOSIS — I10 Essential (primary) hypertension: Secondary | ICD-10-CM | POA: Diagnosis not present

## 2018-01-23 DIAGNOSIS — M48061 Spinal stenosis, lumbar region without neurogenic claudication: Secondary | ICD-10-CM | POA: Diagnosis not present

## 2018-01-23 DIAGNOSIS — M19111 Post-traumatic osteoarthritis, right shoulder: Secondary | ICD-10-CM | POA: Diagnosis not present

## 2018-01-23 DIAGNOSIS — M19112 Post-traumatic osteoarthritis, left shoulder: Secondary | ICD-10-CM | POA: Diagnosis not present

## 2018-01-23 DIAGNOSIS — S81802D Unspecified open wound, left lower leg, subsequent encounter: Secondary | ICD-10-CM | POA: Diagnosis not present

## 2018-01-23 DIAGNOSIS — Z96619 Presence of unspecified artificial shoulder joint: Secondary | ICD-10-CM | POA: Diagnosis not present

## 2018-01-23 NOTE — Telephone Encounter (Signed)
Author phoned son, Antony Haste to relay Dr. Nonda Lou message. Antony Haste stated the doppler was cancelled by [facility] already. However, pt. scheduled for angiograpahy on 8/13 with [facility]. Dr. Etter Sjogren made aware.

## 2018-01-26 ENCOUNTER — Telehealth: Payer: Self-pay | Admitting: Family Medicine

## 2018-01-26 ENCOUNTER — Other Ambulatory Visit: Payer: Self-pay | Admitting: *Deleted

## 2018-01-26 DIAGNOSIS — L89892 Pressure ulcer of other site, stage 2: Secondary | ICD-10-CM | POA: Diagnosis not present

## 2018-01-26 DIAGNOSIS — Z96619 Presence of unspecified artificial shoulder joint: Secondary | ICD-10-CM | POA: Diagnosis not present

## 2018-01-26 DIAGNOSIS — I10 Essential (primary) hypertension: Secondary | ICD-10-CM | POA: Diagnosis not present

## 2018-01-26 DIAGNOSIS — S81802D Unspecified open wound, left lower leg, subsequent encounter: Secondary | ICD-10-CM | POA: Diagnosis not present

## 2018-01-26 DIAGNOSIS — M19112 Post-traumatic osteoarthritis, left shoulder: Secondary | ICD-10-CM | POA: Diagnosis not present

## 2018-01-26 DIAGNOSIS — M48061 Spinal stenosis, lumbar region without neurogenic claudication: Secondary | ICD-10-CM | POA: Diagnosis not present

## 2018-01-26 DIAGNOSIS — L89890 Pressure ulcer of other site, unstageable: Secondary | ICD-10-CM | POA: Diagnosis not present

## 2018-01-26 DIAGNOSIS — L89891 Pressure ulcer of other site, stage 1: Secondary | ICD-10-CM | POA: Diagnosis not present

## 2018-01-26 DIAGNOSIS — L89152 Pressure ulcer of sacral region, stage 2: Secondary | ICD-10-CM | POA: Diagnosis not present

## 2018-01-26 DIAGNOSIS — L89620 Pressure ulcer of left heel, unstageable: Secondary | ICD-10-CM | POA: Diagnosis not present

## 2018-01-26 DIAGNOSIS — M19111 Post-traumatic osteoarthritis, right shoulder: Secondary | ICD-10-CM | POA: Diagnosis not present

## 2018-01-26 NOTE — Telephone Encounter (Signed)
Routed to Dr. Etter Sjogren to approve celebrex Rx. Last prescribed to her 06/03/2016 for #100, 0RF.

## 2018-01-26 NOTE — Telephone Encounter (Signed)
Copied from [place] [id number]. Topic: Quick Communication - See Telephone Encounter >> Jan 26, 2018  8:16 AM Burchel, Abbi R wrote: CRM for notification. See Telephone encounter for: 01/26/18.  [facility]) requesting v/o for Skilled Nursing 3*8wk plus 3 PRN visits HH Aide 2*9wk  Cb: [phone]

## 2018-01-26 NOTE — Telephone Encounter (Signed)
Called [employer] to reorder

## 2018-01-26 NOTE — Patient Outreach (Signed)
[facility]) Care Management  [facility] Social Work  01/26/2018  Lasheika Ortloff 1927/10/16 [id number]  Subjective:  "mom is going to have to have 2 surgeries" Objective: [facility] CSW to assist patient and family with community based resources to aide in their well-being, quality of life and overall safety and needs.         Current Medications:  Current Outpatient Medications  Medication Sig Dispense Refill  . atorvastatin (LIPITOR) 10 MG tablet Take 1 tablet (10 mg total) by mouth at bedtime. For high cholesterol 90 tablet 1  . celecoxib (CELEBREX) 200 MG capsule Take 1 capsule (200 mg total) by mouth daily. 100 capsule 0  . Cholecalciferol (VITAMIN D3) 5000 units CAPS Take 5,000 Units by mouth at bedtime.     . furosemide (LASIX) 40 MG tablet Take 1 tablet (40 mg total) by mouth daily. 90 tablet 1  . gabapentin (NEURONTIN) 100 MG capsule Take 1 capsule (100 mg total) by mouth 3 (three) times daily. 270 capsule 1  . losartan (COZAAR) 100 MG tablet Take 1 tablet (100 mg total) by mouth daily. 90 tablet 1  . Misc. Devices [facility]) MISC Use as directed 1 each 0  . montelukast (SINGULAIR) 10 MG tablet Take 1 tablet (10 mg total) by mouth at bedtime. 90 tablet 1  . Multiple Vitamins-Minerals (CENTRUM SILVER) tablet Take 1 tablet by mouth daily.    . NONFORMULARY OR COMPOUNDED ITEM Lift recliner #1  As directed  Dx spinal stenosis, osteoarthritis both shoulder ,  Low ext weakness 1 each 0  . pantoprazole (PROTONIX) 40 MG tablet Take 1 tablet (40 mg total) by mouth daily. 90 tablet 1  . SANTYL ointment Apply 1 application topically daily.  3  . senna-docusate (SENOKOT-S) 8.6-50 MG per tablet Take 1 tablet by mouth at bedtime.     . traMADol (ULTRAM) 50 MG tablet Take 1 tablet (50 mg total) by mouth 3 (three) times daily. 270 tablet 1   No current facility-administered medications for this visit.     Functional Status:  In your present state of health, do you have any difficulty  performing the following activities: 01/02/2018  Hearing? Y  Vision? Y  Difficulty concentrating or making decisions? N  Walking or climbing stairs? Y  Dressing or bathing? Y  Doing errands, shopping? Y  Preparing Food and eating ? Y  Using the Toilet? Y  In the past six months, have you accidently leaked urine? N  Do you have problems with loss of bowel control? N  Managing your Medications? Y  Managing your Finances? Y  Housekeeping or managing your Housekeeping? Y  Some recent data might be hidden    Fall/Depression Screening:  Fall Risk  01/02/2018 12/26/2017 09/02/2017  Falls in the past year? Yes No No  Comment slipped out of the chair/ - -  Number falls in past yr: - - -  Injury with Fall? - - -  Risk for fall due to : Impaired balance/gait Impaired vision;Impaired balance/gait;Impaired mobility;Medication side effect -  Risk for fall due to: Comment - - -   PHQ 2/9 Scores 12/26/2017 09/02/2017 05/21/2016 01/04/2016 10/24/2015 10/27/2014 06/29/2014  PHQ - 2 Score 0 0 0 0 0 0 0    Assessment: CSW was able to reach pt's son by phone. He states she will be having angioplasty on both of her legs and he plans to hold off on pursuing FL2, Medicaid, etc. "I want to focus on her medical needs and see  how she does". He hopes the 2 surgeries will help with healing of her wounds.    Plan: CSW discussed plans to sign off at this time. Pt's son instructed to call CSW if  plans for SNF, or other support/ needs arise.  [facility] CM Care Plan Problem One     Most Recent Value  Care Plan Problem One  Patient lives alone and is  needing more assistance .   Role Documenting the Problem One  Clinical Social Worker  Care Plan for Problem One  Active  [facility] Long Term Goal   Patient and family will have adequate support arranged for pt in the next 31 days.  [facility] Long Term Goal Start Date  01/02/18  [facility] Long Term Goal Met Date  01/26/18  Interventions for Problem One Long Term Goal  Per son, pt will have 2 surgeries  and plans to reassess pt needs thereafter.   [facility] CM Short Term Goal #1    Family will report FL2 completed by PCP in the next 14 days.   [facility] CM Short Term Goal #1 Start Date  01/02/18  [facility] CM Short Term Goal #1 Met Date  01/26/18  Interventions for Short Term Goal #1  Son has decided to hold off until after her angiosurgeries  [facility] CM Short Term Goal #2   Family will have knowledge of her Medicaid eligibilty in the next 14 days.   [facility] CM Short Term Goal #2 Start Date  01/02/18  [facility] CM Short Term Goal #2 Met Date  01/26/18  Interventions for Short Term Goal #2  Son has decided to "place on the backburner"      Eduard Clos, MSW, [place] Worker  [place] [phone]

## 2018-01-26 NOTE — Telephone Encounter (Signed)
Author called pt. to let her know that [facility], CMA, has contacted [employer] to reorder the celebrex. Antony Haste, son, answered, and author relayed information. Antony Haste requested for him to be called at [phone] when celebrex arrives via rx outreach pharmacy so he can pick it up at the office.  Son also requesting Dr. Etter Sjogren to put in a note to vascular surgery the need for pt. To stay overnight s/p pending angioplasty, as pt. "can't walk, and it's just too much for [place] to do in one day". Author stated that it was ultimately up to the vascular team if she stays overnight, and it was a conversation to have with them, and son said "well, Dr. Etter Sjogren knows her, so a note from her I think would go a long way". Routed to Dr. Etter Sjogren to review.

## 2018-01-26 NOTE — Telephone Encounter (Signed)
It is up to the surgical team----and the ins but they should have a serious talk with maybe the surgeons nurse if they have not spoken to them already

## 2018-01-27 ENCOUNTER — Telehealth: Payer: Self-pay

## 2018-01-27 DIAGNOSIS — L89620 Pressure ulcer of left heel, unstageable: Secondary | ICD-10-CM | POA: Diagnosis not present

## 2018-01-27 DIAGNOSIS — L89892 Pressure ulcer of other site, stage 2: Secondary | ICD-10-CM | POA: Diagnosis not present

## 2018-01-27 DIAGNOSIS — L89890 Pressure ulcer of other site, unstageable: Secondary | ICD-10-CM | POA: Diagnosis not present

## 2018-01-27 DIAGNOSIS — I10 Essential (primary) hypertension: Secondary | ICD-10-CM | POA: Diagnosis not present

## 2018-01-27 DIAGNOSIS — L89152 Pressure ulcer of sacral region, stage 2: Secondary | ICD-10-CM | POA: Diagnosis not present

## 2018-01-27 DIAGNOSIS — L89891 Pressure ulcer of other site, stage 1: Secondary | ICD-10-CM | POA: Diagnosis not present

## 2018-01-27 NOTE — Telephone Encounter (Signed)
Pt called in to check the status of her message sent. I will advise pt that someone will contact her to discuss further.    CB: [phone]

## 2018-01-27 NOTE — Telephone Encounter (Signed)
Copied from [place] [id number]. Topic: General - Other >> Jan 27, 2018 10:32 AM Keene Breath wrote: Reason for CRM: Patient called to find out the status of her medication celecoxib (CELEBREX) 200 MG capsule.  She stated that it comes from [employer] and she usually picks it up from the office.  No one has called her to give her an update and she is running out of this medication.  Please advise.  CB# [phone].

## 2018-01-28 DIAGNOSIS — L89891 Pressure ulcer of other site, stage 1: Secondary | ICD-10-CM | POA: Diagnosis not present

## 2018-01-28 DIAGNOSIS — I10 Essential (primary) hypertension: Secondary | ICD-10-CM | POA: Diagnosis not present

## 2018-01-28 DIAGNOSIS — L89890 Pressure ulcer of other site, unstageable: Secondary | ICD-10-CM | POA: Diagnosis not present

## 2018-01-28 DIAGNOSIS — L89152 Pressure ulcer of sacral region, stage 2: Secondary | ICD-10-CM | POA: Diagnosis not present

## 2018-01-28 DIAGNOSIS — L89620 Pressure ulcer of left heel, unstageable: Secondary | ICD-10-CM | POA: Diagnosis not present

## 2018-01-28 DIAGNOSIS — L89892 Pressure ulcer of other site, stage 2: Secondary | ICD-10-CM | POA: Diagnosis not present

## 2018-01-28 NOTE — Telephone Encounter (Signed)
Patient son [name] notified and he will talk with Psychologist, sport and exercise.

## 2018-01-28 NOTE — Telephone Encounter (Signed)
Left message to call back  

## 2018-01-28 NOTE — Telephone Encounter (Signed)
Verbal order given to Crystal.  

## 2018-01-28 NOTE — Telephone Encounter (Signed)
Patient called to follow up on previous request and would like a return call

## 2018-01-29 ENCOUNTER — Ambulatory Visit: Payer: Medicare Other | Admitting: Nurse Practitioner

## 2018-01-29 ENCOUNTER — Telehealth (INDEPENDENT_AMBULATORY_CARE_PROVIDER_SITE_OTHER): Payer: Self-pay

## 2018-01-29 DIAGNOSIS — L89890 Pressure ulcer of other site, unstageable: Secondary | ICD-10-CM | POA: Diagnosis not present

## 2018-01-29 DIAGNOSIS — L89152 Pressure ulcer of sacral region, stage 2: Secondary | ICD-10-CM | POA: Diagnosis not present

## 2018-01-29 DIAGNOSIS — L89620 Pressure ulcer of left heel, unstageable: Secondary | ICD-10-CM | POA: Diagnosis not present

## 2018-01-29 DIAGNOSIS — L89891 Pressure ulcer of other site, stage 1: Secondary | ICD-10-CM | POA: Diagnosis not present

## 2018-01-29 DIAGNOSIS — L89892 Pressure ulcer of other site, stage 2: Secondary | ICD-10-CM | POA: Diagnosis not present

## 2018-01-29 DIAGNOSIS — I10 Essential (primary) hypertension: Secondary | ICD-10-CM | POA: Diagnosis not present

## 2018-01-29 NOTE — Telephone Encounter (Signed)
Patient's son called with the concern that the patient is having a leg angiogram procedure with Dr. Delana Meyer on 02/03/18. He stated she doesn't walk and is in a wheelchair now so he wanted to know if she could be kept in the hospital overnight, because caring for her was going to be a challenge. He also stated how was he to get her to the bathroom.I explained that after her procedure her care would be the same and that her procedure would not cause any other issues with her being in a wheelchair. I also explained that unless it is medically necessary she will not be kept overnight. The son then asked if she was having trouble with her blood pressures or other issues would she be kept overnight and I explained again that unless it was medically necessary she would not be kept overnight and that Medicare insurance requires as most insurances that overnight stays be medically necessary. Dr. Delana Meyer has been informed of the son's concerns.

## 2018-01-29 NOTE — Telephone Encounter (Signed)
Patient notified. That rx has been ordered

## 2018-01-30 DIAGNOSIS — L89890 Pressure ulcer of other site, unstageable: Secondary | ICD-10-CM | POA: Diagnosis not present

## 2018-01-30 DIAGNOSIS — L89152 Pressure ulcer of sacral region, stage 2: Secondary | ICD-10-CM | POA: Diagnosis not present

## 2018-01-30 DIAGNOSIS — L89891 Pressure ulcer of other site, stage 1: Secondary | ICD-10-CM | POA: Diagnosis not present

## 2018-01-30 DIAGNOSIS — L89892 Pressure ulcer of other site, stage 2: Secondary | ICD-10-CM | POA: Diagnosis not present

## 2018-01-30 DIAGNOSIS — I10 Essential (primary) hypertension: Secondary | ICD-10-CM | POA: Diagnosis not present

## 2018-01-30 DIAGNOSIS — L89620 Pressure ulcer of left heel, unstageable: Secondary | ICD-10-CM | POA: Diagnosis not present

## 2018-02-02 ENCOUNTER — Telehealth: Payer: Self-pay | Admitting: *Deleted

## 2018-02-02 ENCOUNTER — Other Ambulatory Visit (INDEPENDENT_AMBULATORY_CARE_PROVIDER_SITE_OTHER): Payer: Self-pay | Admitting: Vascular Surgery

## 2018-02-02 DIAGNOSIS — L89890 Pressure ulcer of other site, unstageable: Secondary | ICD-10-CM | POA: Diagnosis not present

## 2018-02-02 DIAGNOSIS — I10 Essential (primary) hypertension: Secondary | ICD-10-CM | POA: Diagnosis not present

## 2018-02-02 DIAGNOSIS — L89152 Pressure ulcer of sacral region, stage 2: Secondary | ICD-10-CM | POA: Diagnosis not present

## 2018-02-02 DIAGNOSIS — L89892 Pressure ulcer of other site, stage 2: Secondary | ICD-10-CM | POA: Diagnosis not present

## 2018-02-02 DIAGNOSIS — L89891 Pressure ulcer of other site, stage 1: Secondary | ICD-10-CM | POA: Diagnosis not present

## 2018-02-02 DIAGNOSIS — L89620 Pressure ulcer of left heel, unstageable: Secondary | ICD-10-CM | POA: Diagnosis not present

## 2018-02-02 MED ORDER — CEFAZOLIN SODIUM-DEXTROSE 2-4 GM/100ML-% IV SOLN
2.0000 g | Freq: Once | INTRAVENOUS | Status: AC
  Administered 2018-02-03: 2 g via INTRAVENOUS

## 2018-02-02 NOTE — Telephone Encounter (Signed)
Left message on son [name] machine that Celebrex is ready for pickup at the front office.

## 2018-02-02 NOTE — Telephone Encounter (Signed)
Received Physician Orders/Home Health Certification H6D from [facility]; forwarded to provider/SLS 08/12

## 2018-02-03 ENCOUNTER — Encounter
Admission: RE | Disposition: A | Discharge status: Skilled Nursing Facility | Payer: Self-pay | Source: Home / Self Care | Attending: Vascular Surgery

## 2018-02-03 ENCOUNTER — Other Ambulatory Visit: Payer: Self-pay

## 2018-02-03 ENCOUNTER — Inpatient Hospital Stay
Admission: RE | Admit: 2018-02-03 | Discharge: 2018-02-12 | DRG: 254 | Disposition: A | Discharge status: Skilled Nursing Facility | Payer: Medicare Other | Attending: Vascular Surgery | Admitting: Vascular Surgery

## 2018-02-03 ENCOUNTER — Observation Stay: Payer: Medicare Other

## 2018-02-03 DIAGNOSIS — G839 Paralytic syndrome, unspecified: Secondary | ICD-10-CM | POA: Diagnosis present

## 2018-02-03 DIAGNOSIS — K219 Gastro-esophageal reflux disease without esophagitis: Secondary | ICD-10-CM | POA: Diagnosis present

## 2018-02-03 DIAGNOSIS — G629 Polyneuropathy, unspecified: Secondary | ICD-10-CM | POA: Diagnosis not present

## 2018-02-03 DIAGNOSIS — R112 Nausea with vomiting, unspecified: Secondary | ICD-10-CM

## 2018-02-03 DIAGNOSIS — I959 Hypotension, unspecified: Secondary | ICD-10-CM | POA: Diagnosis not present

## 2018-02-03 DIAGNOSIS — I70248 Atherosclerosis of native arteries of left leg with ulceration of other part of lower left leg: Secondary | ICD-10-CM | POA: Diagnosis not present

## 2018-02-03 DIAGNOSIS — L97909 Non-pressure chronic ulcer of unspecified part of unspecified lower leg with unspecified severity: Secondary | ICD-10-CM

## 2018-02-03 DIAGNOSIS — Z452 Encounter for adjustment and management of vascular access device: Secondary | ICD-10-CM

## 2018-02-03 DIAGNOSIS — Z8261 Family history of arthritis: Secondary | ICD-10-CM

## 2018-02-03 DIAGNOSIS — E785 Hyperlipidemia, unspecified: Secondary | ICD-10-CM | POA: Diagnosis present

## 2018-02-03 DIAGNOSIS — Z96643 Presence of artificial hip joint, bilateral: Secondary | ICD-10-CM | POA: Diagnosis present

## 2018-02-03 DIAGNOSIS — R05 Cough: Secondary | ICD-10-CM

## 2018-02-03 DIAGNOSIS — I739 Peripheral vascular disease, unspecified: Secondary | ICD-10-CM | POA: Diagnosis present

## 2018-02-03 DIAGNOSIS — I70243 Atherosclerosis of native arteries of left leg with ulceration of ankle: Secondary | ICD-10-CM | POA: Diagnosis present

## 2018-02-03 DIAGNOSIS — Z79899 Other long term (current) drug therapy: Secondary | ICD-10-CM

## 2018-02-03 DIAGNOSIS — I70242 Atherosclerosis of native arteries of left leg with ulceration of calf: Principal | ICD-10-CM | POA: Diagnosis present

## 2018-02-03 DIAGNOSIS — E860 Dehydration: Secondary | ICD-10-CM | POA: Diagnosis present

## 2018-02-03 DIAGNOSIS — K5900 Constipation, unspecified: Secondary | ICD-10-CM

## 2018-02-03 DIAGNOSIS — I10 Essential (primary) hypertension: Secondary | ICD-10-CM | POA: Diagnosis present

## 2018-02-03 DIAGNOSIS — L98423 Non-pressure chronic ulcer of back with necrosis of muscle: Secondary | ICD-10-CM

## 2018-02-03 DIAGNOSIS — R059 Cough, unspecified: Secondary | ICD-10-CM

## 2018-02-03 DIAGNOSIS — R918 Other nonspecific abnormal finding of lung field: Secondary | ICD-10-CM | POA: Diagnosis not present

## 2018-02-03 DIAGNOSIS — Z96611 Presence of right artificial shoulder joint: Secondary | ICD-10-CM | POA: Diagnosis present

## 2018-02-03 DIAGNOSIS — I70232 Atherosclerosis of native arteries of right leg with ulceration of calf: Secondary | ICD-10-CM | POA: Diagnosis present

## 2018-02-03 DIAGNOSIS — Z79891 Long term (current) use of opiate analgesic: Secondary | ICD-10-CM

## 2018-02-03 DIAGNOSIS — M1990 Unspecified osteoarthritis, unspecified site: Secondary | ICD-10-CM | POA: Diagnosis present

## 2018-02-03 DIAGNOSIS — Z87891 Personal history of nicotine dependence: Secondary | ICD-10-CM

## 2018-02-03 DIAGNOSIS — I70299 Other atherosclerosis of native arteries of extremities, unspecified extremity: Secondary | ICD-10-CM

## 2018-02-03 HISTORY — PX: LOWER EXTREMITY ANGIOGRAPHY: CATH118251

## 2018-02-03 LAB — CBC
HCT: 28.3 % — ABNORMAL LOW (ref 35.0–47.0)
Hemoglobin: 9.5 g/dL — ABNORMAL LOW (ref 12.0–16.0)
MCH: 27.4 pg (ref 26.0–34.0)
MCHC: 33.5 g/dL (ref 32.0–36.0)
MCV: 81.8 fL (ref 80.0–100.0)
Platelets: 210 10*3/uL (ref 150–440)
RBC: 3.45 MIL/uL — ABNORMAL LOW (ref 3.80–5.20)
RDW: 14.5 % (ref 11.5–14.5)
WBC: 8 10*3/uL (ref 3.6–11.0)

## 2018-02-03 LAB — CREATININE, SERUM: Creatinine, Ser: 0.74 mg/dL (ref 0.44–1.00)

## 2018-02-03 LAB — PROTIME-INR
INR: 1.1
Prothrombin Time: 14.1 seconds (ref 11.4–15.2)

## 2018-02-03 LAB — APTT: aPTT: 123 seconds — ABNORMAL HIGH (ref 24–36)

## 2018-02-03 LAB — BUN: BUN: 39 mg/dL — AB (ref 8–23)

## 2018-02-03 SURGERY — LOWER EXTREMITY ANGIOGRAPHY
Anesthesia: Moderate Sedation | Laterality: Left

## 2018-02-03 MED ORDER — SODIUM CHLORIDE 0.9 % IV SOLN
INTRAVENOUS | Status: AC
  Administered 2018-02-03: 18:00:00 via INTRAVENOUS

## 2018-02-03 MED ORDER — SODIUM CHLORIDE 0.9% FLUSH
3.0000 mL | Freq: Two times a day (BID) | INTRAVENOUS | Status: DC
  Administered 2018-02-04 – 2018-02-12 (×15): 3 mL via INTRAVENOUS

## 2018-02-03 MED ORDER — CLOPIDOGREL BISULFATE 75 MG PO TABS
ORAL_TABLET | ORAL | Status: AC
  Filled 2018-02-03: qty 4

## 2018-02-03 MED ORDER — ATORVASTATIN CALCIUM 10 MG PO TABS
10.0000 mg | ORAL_TABLET | Freq: Every day | ORAL | Status: DC
  Administered 2018-02-03 – 2018-02-11 (×8): 10 mg via ORAL
  Filled 2018-02-03 (×8): qty 1

## 2018-02-03 MED ORDER — LOSARTAN POTASSIUM 50 MG PO TABS: 100.0000 mg | ORAL_TABLET | Freq: Every day | ORAL | Status: DC

## 2018-02-03 MED ORDER — LIDOCAINE HCL (PF) 1 % IJ SOLN
INTRAMUSCULAR | Status: AC
  Filled 2018-02-03: qty 30

## 2018-02-03 MED ORDER — ONDANSETRON HCL 4 MG/2ML IJ SOLN: 4.0000 mg | Freq: Four times a day (QID) | INTRAMUSCULAR | Status: DC | PRN

## 2018-02-03 MED ORDER — ACETAMINOPHEN 325 MG PO TABS
650.0000 mg | ORAL_TABLET | ORAL | Status: DC | PRN
  Administered 2018-02-10 – 2018-02-11 (×2): 650 mg via ORAL
  Filled 2018-02-03 (×3): qty 2

## 2018-02-03 MED ORDER — CLOPIDOGREL BISULFATE 75 MG PO TABS
300.0000 mg | ORAL_TABLET | Freq: Once | ORAL | Status: AC
  Administered 2018-02-03: 300 mg via ORAL

## 2018-02-03 MED ORDER — HEPARIN SODIUM (PORCINE) 1000 UNIT/ML IJ SOLN
INTRAMUSCULAR | Status: DC | PRN
  Administered 2018-02-03: 5000 [IU] via INTRAVENOUS

## 2018-02-03 MED ORDER — TRAMADOL HCL 50 MG PO TABS
50.0000 mg | ORAL_TABLET | Freq: Three times a day (TID) | ORAL | Status: DC
  Administered 2018-02-03 – 2018-02-12 (×24): 50 mg via ORAL
  Filled 2018-02-03 (×24): qty 1

## 2018-02-03 MED ORDER — SENNOSIDES-DOCUSATE SODIUM 8.6-50 MG PO TABS
1.0000 | ORAL_TABLET | Freq: Every day | ORAL | Status: DC
  Administered 2018-02-03 – 2018-02-11 (×9): 1 via ORAL
  Filled 2018-02-03 (×9): qty 1

## 2018-02-03 MED ORDER — SODIUM CHLORIDE 0.9 % IV SOLN: 250.0000 mL | INTRAVENOUS | Status: DC | PRN

## 2018-02-03 MED ORDER — CLOPIDOGREL BISULFATE 75 MG PO TABS
75.0000 mg | ORAL_TABLET | Freq: Every day | ORAL | Status: DC
  Administered 2018-02-04 – 2018-02-12 (×9): 75 mg via ORAL
  Filled 2018-02-03 (×9): qty 1

## 2018-02-03 MED ORDER — FENTANYL CITRATE (PF) 100 MCG/2ML IJ SOLN
INTRAMUSCULAR | Status: DC | PRN
  Administered 2018-02-03 (×2): 50 ug via INTRAVENOUS

## 2018-02-03 MED ORDER — SODIUM CHLORIDE 0.9% FLUSH: 3.0000 mL | INTRAVENOUS | Status: DC | PRN

## 2018-02-03 MED ORDER — TRAMADOL HCL 50 MG PO TABS
ORAL_TABLET | ORAL | Status: AC
  Filled 2018-02-03: qty 1

## 2018-02-03 MED ORDER — HEPARIN (PORCINE) IN NACL 100-0.45 UNIT/ML-% IJ SOLN
800.0000 [IU]/h | INTRAMUSCULAR | Status: DC
  Administered 2018-02-03 – 2018-02-04 (×2): 900 [IU]/h via INTRAVENOUS
  Administered 2018-02-06: 800 [IU]/h via INTRAVENOUS
  Filled 2018-02-03 (×2): qty 250

## 2018-02-03 MED ORDER — IOPAMIDOL (ISOVUE-300) INJECTION 61%
INTRAVENOUS | Status: DC | PRN
  Administered 2018-02-03: 68 mL via INTRA_ARTERIAL

## 2018-02-03 MED ORDER — FAMOTIDINE 20 MG PO TABS: 40.0000 mg | ORAL_TABLET | ORAL | Status: DC | PRN

## 2018-02-03 MED ORDER — HEPARIN SODIUM (PORCINE) 1000 UNIT/ML IJ SOLN
INTRAMUSCULAR | Status: AC
  Filled 2018-02-03: qty 1

## 2018-02-03 MED ORDER — FUROSEMIDE 40 MG PO TABS: 40.0000 mg | ORAL_TABLET | Freq: Every day | ORAL | Status: DC

## 2018-02-03 MED ORDER — MIDAZOLAM HCL 5 MG/5ML IJ SOLN
INTRAMUSCULAR | Status: AC
  Filled 2018-02-03: qty 5

## 2018-02-03 MED ORDER — NITROGLYCERIN 1 MG/10 ML FOR IR/CATH LAB
INTRA_ARTERIAL | Status: DC | PRN
  Administered 2018-02-03: 250 ug via INTRA_ARTERIAL
  Administered 2018-02-03: 500 ug via INTRA_ARTERIAL
  Administered 2018-02-03: 250 ug via INTRA_ARTERIAL

## 2018-02-03 MED ORDER — ATORVASTATIN CALCIUM 10 MG PO TABS: 10.0000 mg | ORAL_TABLET | Freq: Every day | ORAL | Status: DC

## 2018-02-03 MED ORDER — SODIUM CHLORIDE 0.9 % IV SOLN
INTRAVENOUS | Status: AC | PRN
  Administered 2018-02-03 (×2): 250 mL via INTRAVENOUS

## 2018-02-03 MED ORDER — MONTELUKAST SODIUM 10 MG PO TABS
10.0000 mg | ORAL_TABLET | Freq: Every day | ORAL | Status: DC
  Administered 2018-02-03 – 2018-02-11 (×9): 10 mg via ORAL
  Filled 2018-02-03 (×9): qty 1

## 2018-02-03 MED ORDER — MIDAZOLAM HCL 2 MG/2ML IJ SOLN
INTRAMUSCULAR | Status: DC | PRN
  Administered 2018-02-03: 2 mg via INTRAVENOUS
  Administered 2018-02-03: 1 mg via INTRAVENOUS

## 2018-02-03 MED ORDER — HEPARIN (PORCINE) IN NACL 1000-0.9 UT/500ML-% IV SOLN
INTRAVENOUS | Status: AC
  Filled 2018-02-03: qty 1000

## 2018-02-03 MED ORDER — MORPHINE SULFATE (PF) 4 MG/ML IV SOLN
2.0000 mg | INTRAVENOUS | Status: DC | PRN
  Administered 2018-02-03 – 2018-02-11 (×7): 2 mg via INTRAVENOUS
  Filled 2018-02-03 (×7): qty 1

## 2018-02-03 MED ORDER — PANTOPRAZOLE SODIUM 40 MG PO TBEC
40.0000 mg | DELAYED_RELEASE_TABLET | Freq: Every day | ORAL | Status: DC
  Administered 2018-02-04 – 2018-02-12 (×9): 40 mg via ORAL
  Filled 2018-02-03 (×9): qty 1

## 2018-02-03 MED ORDER — ADULT MULTIVITAMIN W/MINERALS CH
ORAL_TABLET | Freq: Every day | ORAL | Status: DC
  Administered 2018-02-04 – 2018-02-12 (×9): 1 via ORAL
  Filled 2018-02-03 (×9): qty 1

## 2018-02-03 MED ORDER — HYDROMORPHONE HCL 1 MG/ML IJ SOLN: 1.0000 mg | Freq: Once | INTRAMUSCULAR | Status: DC | PRN

## 2018-02-03 MED ORDER — HEPARIN (PORCINE) IN NACL 100-0.45 UNIT/ML-% IJ SOLN
INTRAMUSCULAR | Status: AC
  Filled 2018-02-03: qty 250

## 2018-02-03 MED ORDER — FENTANYL CITRATE (PF) 100 MCG/2ML IJ SOLN
INTRAMUSCULAR | Status: AC
  Filled 2018-02-03: qty 2

## 2018-02-03 MED ORDER — GABAPENTIN 100 MG PO CAPS
100.0000 mg | ORAL_CAPSULE | Freq: Three times a day (TID) | ORAL | Status: DC
  Administered 2018-02-03 – 2018-02-12 (×25): 100 mg via ORAL
  Filled 2018-02-03 (×25): qty 1

## 2018-02-03 MED ORDER — ONDANSETRON HCL 4 MG/2ML IJ SOLN
4.0000 mg | Freq: Four times a day (QID) | INTRAMUSCULAR | Status: DC | PRN
  Administered 2018-02-05 – 2018-02-07 (×2): 4 mg via INTRAVENOUS
  Filled 2018-02-03 (×2): qty 2

## 2018-02-03 MED ORDER — METHYLPREDNISOLONE SODIUM SUCC 125 MG IJ SOLR: 125.0000 mg | INTRAMUSCULAR | Status: DC | PRN

## 2018-02-03 MED ORDER — SODIUM CHLORIDE 0.9 % IJ SOLN
INTRAMUSCULAR | Status: AC
  Filled 2018-02-03: qty 50

## 2018-02-03 MED ORDER — COLLAGENASE 250 UNIT/GM EX OINT
1.0000 "application " | TOPICAL_OINTMENT | Freq: Two times a day (BID) | CUTANEOUS | Status: DC
  Administered 2018-02-03 – 2018-02-12 (×14): 1 via TOPICAL
  Filled 2018-02-03 (×3): qty 30

## 2018-02-03 MED ORDER — SODIUM CHLORIDE 0.9 % IV SOLN
INTRAVENOUS | Status: DC
  Administered 2018-02-03: 11:00:00 via INTRAVENOUS

## 2018-02-03 MED ORDER — VITAMIN D 1000 UNITS PO TABS
5000.0000 [IU] | ORAL_TABLET | Freq: Every day | ORAL | Status: DC
Start: 2018-02-03 — End: 2018-02-12
  Administered 2018-02-03 – 2018-02-11 (×9): 5000 [IU] via ORAL
  Filled 2018-02-03 (×9): qty 5

## 2018-02-03 SURGICAL SUPPLY — 27 items
BALLN LUTONIX 018 4X150X130 (BALLOONS) ×3
BALLN STERLING OTW 2X100X150 (BALLOONS) ×3
BALLN STERLING OTW 3X20X150 (BALLOONS) ×3
BALLN ULTRVRSE 2.5X220X150 (BALLOONS) ×3
BALLN ULTRVRSE 3X100X150 (BALLOONS) ×3
BALLOON LUTONIX 018 4X150X130 (BALLOONS) ×1 IMPLANT
BALLOON STERLING OTW 2X100X150 (BALLOONS) ×1 IMPLANT
BALLOON STERLING OTW 3X20X150 (BALLOONS) ×1 IMPLANT
BALLOON ULTRVRSE 2.5X220X150 (BALLOONS) ×1 IMPLANT
BALLOON ULTRVRSE 3X100X150 (BALLOONS) ×1 IMPLANT
CATH G STR 4FX120.038 (CATHETERS) ×3 IMPLANT
CATH PIG 70CM (CATHETERS) ×3 IMPLANT
CATH VERT 5FR 125CM (CATHETERS) ×3 IMPLANT
DEVICE PRESTO INFLATION (MISCELLANEOUS) ×3 IMPLANT
DEVICE STARCLOSE SE CLOSURE (Vascular Products) ×3 IMPLANT
DEVICE TORQUE .025-.038 (MISCELLANEOUS) ×3 IMPLANT
KIT CATH CVC 3 LUMEN 7FR 8IN (MISCELLANEOUS) ×3 IMPLANT
NEEDLE ENTRY 21GA 7CM ECHOTIP (NEEDLE) ×3 IMPLANT
PACK ANGIOGRAPHY (CUSTOM PROCEDURE TRAY) ×3 IMPLANT
SET INTRO CAPELLA COAXIAL (SET/KITS/TRAYS/PACK) ×3 IMPLANT
SHEATH BRITE TIP 5FRX11 (SHEATH) ×3 IMPLANT
SHEATH RAABE 6FR (SHEATH) ×3 IMPLANT
TUBING CONTRAST HIGH PRESS 72 (TUBING) ×3 IMPLANT
VALVE HEMO TOUHY BORST Y (ADAPTER) ×3 IMPLANT
WIRE AQUATRACK .035X260CM (WIRE) ×3 IMPLANT
WIRE G V18X300CM (WIRE) ×3 IMPLANT
WIRE J 3MM .035X145CM (WIRE) ×3 IMPLANT

## 2018-02-03 NOTE — Progress Notes (Signed)
Pt in SPR for recovery, alert and oriented, right groin site WNL. Lab at bedside drawing labs at this time, pharmacy aware of heparin gtt order, awaiting lab results for heparin drip initiation orders per pharmacy. Report called to [place] on [place]. To transfer pt to inpatient room once recovery complete.

## 2018-02-03 NOTE — Op Note (Signed)
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 Introduction catheter into left lower extremity 3rd order catheter placement  2. Contrast injection left lower extremity for distal runoff with additional 3rd order  3. Percutaneous transluminal angioplasty left tibioperoneal trunk and peroneal artery              4. Star close closure right common femoral arteriotomy  Anesthesia: Conscious sedation was administered under my direct supervision by the interventional radiology RN. IV Versed plus fentanyl were utilized. Continuous ECG, pulse oximetry and blood pressure was monitored throughout the entire procedure.  Conscious sedation was for a total of 1 hour 20 minutes.  Sheath: 6 [place] Ansell right common femoral artery retrograde direction  Contrast: 68 cc  Fluoroscopy Time: 14.5 minutes  Indications: Katherine Owen presents with increasing pain of the left lower extremity.  This is associated with multiple ulcerations.  This suggests the patient is having limb threatening ischemia. The risks and benefits are reviewed all questions answered patient agrees to proceed.  Procedure:Katherine Owen is a 84 y.o. y.o. female who was identified and appropriate procedural time out was performed. The patient was then placed supine on the table and prepped and draped in the usual sterile fashion.   Ultrasound was placed in the sterile sleeve and the right groin was evaluated the right common femoral artery was echolucent and pulsatile indicating patency. Image was recorded for the permanent record and under real-time visualization a microneedle was  inserted into the common femoral artery followed by the microwire and then the micro-sheath. A J-wire was then advanced through the micro-sheath and a 5 [place] sheath was then inserted over a J-wire. J-wire was then advanced and a 5 French pigtail catheter was positioned at the level of T12.  AP projection of the aorta was then obtained. Pigtail catheter was repositioned to above the bifurcation and a RAO view of the pelvis was obtained. Subsequently a pigtail catheter with the stiff angle Glidewire was used to cross the aortic bifurcation the catheter wire were advanced down into the left distal external iliac artery. Oblique view of the femoral bifurcation was then obtained and subsequently the wire was reintroduced and the pigtail catheter negotiated into the SFA representing third order catheter placement. Distal runoff was then performed.  Diffuse atherosclerotic changes and mild tortuosity are noted within the abdominal aorta however there are no hemodynamically significant lesions.  The bilateral common iliac arteries as well as the bilateral external iliac arteries demonstrate diffuse disease but no hemodynamically significant lesions.  The left common femoral artery is patent atherosclerotic changes are noted.  This is also true of the left profunda femoris.  Left SFA demonstrates atherosclerotic changes with a 40 to 50% stenosis at Hunter's canal the popliteal artery demonstrates diffuse changes but without hemodynamically significant stenosis.  The trifurcation is heavily diseased and there is occlusion of the anterior tibial and posterior tibial throughout their course.  There is reconstitution of the posterior tibial at the level of the medial malleolus but poor filling of the foot.  There is no reconstitution of the dorsalis pedis.  The peroneal demonstrates multiple lesions along its course several greater than 70% and several greater than 90% with a string sign noted proximally.  The  tibioperoneal trunk also demonstrates a greater than 90%  stenosis as well.  5000 units of heparin was then given and allowed to circulate and a 6 [place] Raby sheath was advanced up and over the bifurcation and positioned in the femoral artery  Angled catheter and aqua track were then negotiated down into the distal popliteal. Catheter was then advanced. Hand injection contrast with a magnified oblique view demonstrated the tibial anatomy in detail.  The aqua track wire and catheter were then negotiated into the peroneal artery hand-injection contrast confirmed intraluminal position and a V 1801 8 wire was then advanced through the catheter.  A series of balloons ranging from a 2 mm balloon distally to a 4 mm balloon proximally were then used to perform serial angioplasty.  Inflations were between 6 atm and 12 atm for 1 to 2 minutes.  Significant spasm was noted after many of the balloon inflations and multiple injections intra-arterially of nitroglycerin were performed.  After treating the tibioperoneal trunk and peroneal follow-up imaging demonstrated patency with less than 10% residual stenosis and preservation of the distal runoff.  After review of these images the sheath is pulled into the right external iliac oblique of the common femoral is obtained and a Star close device deployed. There no immediate Complications.  Findings:  Diffuse atherosclerotic changes and mild tortuosity are noted within the abdominal aorta however there are no hemodynamically significant lesions.  The bilateral common iliac arteries as well as the bilateral external iliac arteries demonstrate diffuse disease but no hemodynamically significant lesions.  The left common femoral artery is patent atherosclerotic changes are noted.  This is also true of the left profunda femoris.  Left SFA demonstrates atherosclerotic changes with a 40 to 50% stenosis at Hunter's canal the popliteal artery demonstrates diffuse changes but  without hemodynamically significant stenosis.  The trifurcation is heavily diseased and there is occlusion of the anterior tibial and posterior tibial throughout their course.  There is reconstitution of the posterior tibial at the level of the medial malleolus but poor filling of the foot.  There is no reconstitution of the dorsalis pedis.  The peroneal demonstrates multiple lesions along its course several greater than 70% and several greater than 90% with a string sign noted proximally.  The tibioperoneal trunk also demonstrates a greater than 90% stenosis as well.  Following angioplasty the tibioperoneal trunk and peroneal now is now widely patent and demonstrates in-line flow with less than 10% residual stenosis.  Summary: Successful recanalization left lower extremity for limb salvage   Disposition: Patient was taken to the recovery room in stable condition having tolerated the procedure well.  Belenda Cruise Schnier 02/03/2018,5:57 PM

## 2018-02-03 NOTE — Op Note (Signed)
OPERATIVE NOTE   PROCEDURE: 1. Insertion of triple-lumen central venous catheter right IJ approach.  PRE-OPERATIVE DIAGNOSIS: Atherosclerotic occlusive disease bilateral lower extremities with ulceration of the left ankle and toe  POST-OPERATIVE DIAGNOSIS: Same  SURGEON: Katha Cabal M.D.  ANESTHESIA: 1% lidocaine local infiltration  ESTIMATED BLOOD LOSS: Minimal cc  INDICATIONS:   Katherine Owen is a 84 y.o. female who presents with atherosclerotic changes.  She has undergone successful revascularization but has extensive tibial vessel disease.  I have elected to initiate a heparin drip for 24 hours.  The patient does not have adequate IV access and therefore central line is being inserted for appropriate parenteral medications and monitoring of blood tests..  DESCRIPTION: After obtaining full informed written consent, the patient was positioned supine. The right neck was prepped and draped in a sterile fashion. Ultrasound was placed in a sterile sleeve. Ultrasound was utilized to identify the right internal jugular vein which is noted to be echolucent and compressible indicating patency. Images recorded for the permanent record. Under real-time visualization a Seldinger needle is inserted into the vein and the guidewires advanced without difficulty. Small counterincision was made at the wire insertion site. Dilators passed over the wire and the triple-lumen catheter is fed without difficulty.  All 3 lm aspirate and flush easily and are packed with heparin saline. Catheter secured to the skin of the right neck with 2-0 silk. A sterile dressing is applied with Biopatch.  COMPLICATIONS: None  CONDITION: Good  Katha Cabal, M.D. [place] [facility]. Office:  [phone]   02/03/2018, 6:05 PM

## 2018-02-03 NOTE — H&P (Signed)
@LOGO @   MRN : [id number]  Katherine Owen is a 84 y.o. (15-Dec-1927) female who presents with chief complaint of open sores left leg.  History of Present Illness:   The patient is seen for evaluation of painful lower extremities and diminished pulses associated with ulceration of the left ankle and toe.  The patient notes the ulcer has been present for multiple weeks and has not been improving.  It is very painful and has had some drainage.  No specific history of trauma noted by the patient.  The patient denies fever or chills.  the patient does have diabetes which has been difficult to control.  Patient notes prior to the ulcer developing the extremities were painful.  She is wheelchair-bound and unable to ambulate and therefore claudication cannot be assessed.  The pain has been progressive over the past several years.   The patient denies rest pain or dangling of an extremity off the side of the bed during the night for relief. No prior interventions or surgeries.  No history of back problems or DJD of the lumbar sacral spine.   The patient denies amaurosis fugax or recent TIA symptoms. There are no recent neurological changes noted. The patient denies history of DVT, PE or superficial thrombophlebitis. The patient denies recent episodes of angina or shortness of breath.   Current Meds  Medication Sig  . atorvastatin (LIPITOR) 10 MG tablet Take 1 tablet (10 mg total) by mouth at bedtime. For high cholesterol  . celecoxib (CELEBREX) 200 MG capsule Take 1 capsule (200 mg total) by mouth daily.  . Cholecalciferol (VITAMIN D3) 5000 units CAPS Take 5,000 Units by mouth at bedtime.   . furosemide (LASIX) 40 MG tablet Take 1 tablet (40 mg total) by mouth daily.  Marland Kitchen gabapentin (NEURONTIN) 100 MG capsule Take 1 capsule (100 mg total) by mouth 3 (three) times daily.  Marland Kitchen losartan (COZAAR) 100 MG tablet Take 1 tablet (100 mg total) by mouth daily.  . montelukast (SINGULAIR) 10 MG tablet Take 1  tablet (10 mg total) by mouth at bedtime.  . Multiple Vitamins-Minerals (CENTRUM SILVER) tablet Take 1 tablet by mouth daily.  . pantoprazole (PROTONIX) 40 MG tablet Take 1 tablet (40 mg total) by mouth daily.  Marland Kitchen SANTYL ointment Apply 1 application topically daily.  Marland Kitchen senna-docusate (SENOKOT-S) 8.6-50 MG per tablet Take 1 tablet by mouth at bedtime.   . traMADol (ULTRAM) 50 MG tablet Take 1 tablet (50 mg total) by mouth 3 (three) times daily.    Past Medical History:  Diagnosis Date  . Allergy   . Arthritis   . Cataract    Bilateral  . Essential hypertension 05/02/2014  . GERD (gastroesophageal reflux disease)   . Headache   . History of shingles   . HLD (hyperlipidemia) 05/02/2014  . Hyperlipidemia   . Hypertension   . Neuropathic pain 05/02/2014  . Osteoarthritis of both shoulders due to rotator cuff injury 04/23/2016  . Poor mobility 08/28/2015  . 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 Spinal stenosis of lumbar region 05/02/2014   S/p decompression at [facility] November 2015     Past Surgical History:  Procedure Laterality Date  . ANKLE SURGERY Left 2014  . BACK SURGERY  2015   [facility]   . BUNIONECTOMY Right   . COLONOSCOPY    . EYE SURGERY     cataract  . JOINT REPLACEMENT    . REVERSE SHOULDER ARTHROPLASTY Right 01/11/2016   Procedure: REVERSE SHOULDER ARTHROPLASTY;  Surgeon:  Justice Britain, MD;  Location: [facility];  Service: Orthopedics;  Laterality: Right;  . SHOULDER SURGERY  01/11/2016   REVERSE SHOULDER ARTHROPLASTY on 01/11/2016  . SPINE SURGERY     done at [facility] Apr 26, 2014, lumbar decompression  . TOTAL HIP ARTHROPLASTY Bilateral 2005, 2009   [place] , [place]    Social History Social History   Tobacco Use  . Smoking status: Former Smoker    Packs/day: 1.00    Years: 20.00    Pack years: 20.00    Types: Cigarettes    Last attempt to quit: 04/16/1967    Years since quitting: 50.8  . Smokeless tobacco: Never Used  . Tobacco comment: quit somking in early   1970's  Substance Use Topics  . Alcohol use: No    Alcohol/week: 0.0 standard drinks  . Drug use: No    Family History Family History  Problem Relation Age of Onset  . Arthritis Mother        osteoarthritis  . Colon cancer Mother   . Arthritis Father        osteoarthritis  . Parkinson's disease Father   . Anesthesia problems Neg Hx     No Known Allergies   REVIEW OF SYSTEMS (Negative unless checked)  Constitutional: [] Weight loss  [] Fever  [] Chills Cardiac: [] Chest pain   [] Chest pressure   [] Palpitations   [] Shortness of breath when laying flat   [] Shortness of breath with exertion. Vascular:  [] Pain in legs with walking   [x] Pain in legs at rest  [] History of DVT   [] Phlebitis   [x] Swelling in legs   [] Varicose veins   [x] Non-healing ulcers Pulmonary:   [] Uses home oxygen   [] Productive cough   [] Hemoptysis   [] Wheeze  [] COPD   [] Asthma Neurologic:  [] Dizziness   [] Seizures   [] History of stroke   [] History of TIA  [] Aphasia   [] Vissual changes   [] Weakness or numbness in arm   [x] Weakness or numbness in leg Musculoskeletal:   [] Joint swelling   [] Joint pain   [] Low back pain Hematologic:  [] Easy bruising  [] Easy bleeding   [] Hypercoagulable state   [] Anemic Gastrointestinal:  [] Diarrhea   [] Vomiting  [] Gastroesophageal reflux/heartburn   [] Difficulty swallowing. Genitourinary:  [] Chronic kidney disease   [] Difficult urination  [] Frequent urination   [] Blood in urine Skin:  [x] Rashes   [] Ulcers  Psychological:  [] History of anxiety   []  History of major depression.  Physical Examination  Gen: WD/WN, mild distress patient is debilitated presenting in a wheelchair Head: /AT, mild temporalis wasting.  Ear/Nose/Throat: Hearing grossly intact, nares w/o erythema or drainage Eyes: PER, EOMI, sclera nonicteric.  Neck: Supple, no large masses.   Pulmonary:  Good air movement, no audible wheezing bilaterally, no use of accessory muscles.  Cardiac: RRR, no JVD Vascular: Left  ankle ulceration proximally 3 to 4 cm semicircular left toe ulcer also present Vessel Right Left  Radial Palpable Palpable  PT  not palpable  not palpable  DP  not palpable  not palpable  Gastrointestinal: Non-distended. No guarding/no peritoneal signs.  Musculoskeletal: M/S 5/5 throughout both arms legs are 2 out of 5.  + deformity with atrophy both lower extremities.  Neurologic: CN 2-12 intact. Symmetrical.  Speech is fluent. Motor exam as listed above. Psychiatric: Judgment intact, Mood & affect appropriate for pt's clinical situation. Dermatologic: Venous rashes + ulcers noted.  No changes consistent with cellulitis. Lymph : No lichenification or skin changes of chronic lymphedema.  CBC Lab Results  Component  Value Date   WBC 8.6 12/28/2017   HGB 11.1 (L) 12/28/2017   HCT 36.5 12/28/2017   MCV 85.9 12/28/2017   PLT 231 12/28/2017    BMET    Component Value Date/Time   NA 137 12/28/2017 2055   NA 142 01/24/2016 1232   K 4.7 12/28/2017 2055   CL 101 12/28/2017 2055   CO2 26 12/28/2017 2055   GLUCOSE 91 12/28/2017 2055   BUN 33 (H) 12/28/2017 2055   BUN 13 01/24/2016 1232   CREATININE 0.80 12/28/2017 2055   CALCIUM 9.6 12/28/2017 2055   GFRNONAA >60 12/28/2017 2055   GFRAA >60 12/28/2017 2055   CrCl cannot be calculated (Patient's most recent lab result is older than the maximum 21 days allowed.).  COAG No results found for: INR, PROTIME  Radiology No results found.   Assessment/Plan 1.  Atherosclerotic occlusive disease bilateral lower extremities with ulcerations of the left lower extremity:  The patient has evidence of severe atherosclerotic changes of both lower extremities associated with ulceration and tissue loss of the foot.  This represents a limb threatening ischemia and places the patient at the risk for limb loss.  Patient should undergo angiography of the lower extremities with the hope for intervention for limb salvage.  The risks and benefits as well  as the alternative therapies was discussed in detail with the patient.  All questions were answered.  Patient agrees to proceed with angiography.  The patient will follow up with me in the office after the procedure.   2.  GERD:  Continue PPI as already ordered, this medication has been reviewed and there are no changes at this time.  Avoidence of caffeine and alcohol  Moderate elevation of the head of the bed    3.  Hypertension:  Patient will continue medical management; nephrology is following no changes in oral medications.  4.  Degenerative joint disease:   Continue NSAID medications as already ordered, these medications have been reviewed and there are no changes at this time.  Continued activity and therapy was stressed.     Hortencia Pilar, MD  02/03/2018 10:33 AM

## 2018-02-03 NOTE — Consult Note (Signed)
ANTICOAGULATION CONSULT NOTE - Initial Consult  Pharmacy Consult for Heparin Drip  Indication: Limb Ischemia   No Known Allergies  Patient Measurements: Height: 5\' 4"  (162.6 cm) Weight: 175 lb 0.7 oz (79.4 kg) IBW/kg (Calculated) : 54.7 Heparin Dosing Weight: 72kg   Vital Signs: Temp: 97.9 F (36.6 C) (08/13 1042) Temp Source: Oral (08/13 1042) BP: 109/63 (08/13 1515) Pulse Rate: 75 (08/13 1515)  Labs: Recent Labs    02/03/18 1048  CREATININE 0.74    Estimated Creatinine Clearance: 49.6 mL/min (by C-G formula based on SCr of 0.74 mg/dL).   Medical History: Past Medical History:  Diagnosis Date  . Allergy   . Arthritis   . Cataract    Bilateral  . Essential hypertension 05/02/2014  . GERD (gastroesophageal reflux disease)   . Headache   . History of shingles   . HLD (hyperlipidemia) 05/02/2014  . Hyperlipidemia   . Hypertension   . Neuropathic pain 05/02/2014  . Osteoarthritis of both shoulders due to rotator cuff injury 04/23/2016  . Poor mobility 08/28/2015  . 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 Spinal stenosis of lumbar region 05/02/2014   S/p decompression at [facility] November 2015     Assessment: Pharmacy consulted for heparin drip dosing and monitoring in 84yo female admitted with severe atherosclerotic changes of both lower extremities. Patient now S/P angiography.  Goal of Therapy:  Heparin level 0.3-0.7 units/ml Monitor platelets by anticoagulation protocol: Yes   Plan:  Baseline labs ordered, Dosing Wt 72kg  Give 0 units bolus per Dr. Delana Meyer  Start heparin infusion at 900 units/hr Check anti-Xa level in 8 hours and daily while on heparin Continue to monitor H&H and platelets  Pernell Dupre, PharmD, BCPS Clinical Pharmacist 02/03/2018 3:53 PM

## 2018-02-03 NOTE — Consult Note (Signed)
[facility] [place] NAME: Katherine Owen    MR#:  [id number]  DATE OF BIRTH:  01/22/1928  DATE OF CONSULT:  02/03/2018  PRIMARY CARE PHYSICIAN: Ann Held, DO   REQUESTING/REFERRING PHYSICIAN: Dr. Hortencia Pilar  CHIEF COMPLAINT:   Hypotension  HISTORY OF PRESENT ILLNESS:  Katherine Owen  is a 84 y.o. female with a known history of essential hypertension, hyperlipidemia, GERD, spinal stenosis, peripheral vascular disease who was admitted to the vascular surgery service after having an angiogram and intervention including angioplasty of the left tibial peroneal trunk and peroneal artery and postoperatively noted to be somewhat hypotensive.  Patient says her blood pressure normally runs on the low side but it was significantly low with systolic blood pressures in the 90s.  Patient was asymptomatic.  Patient denies any dizziness, shortness of breath, chest pain or any other associated symptoms presently.  Hospitalist services were consulted for medical management and also due to her hypotension.  PAST MEDICAL HISTORY:   Past Medical History:  Diagnosis Date  . Allergy   . Arthritis   . Cataract    Bilateral  . Essential hypertension 05/02/2014  . GERD (gastroesophageal reflux disease)   . Headache   . History of shingles   . HLD (hyperlipidemia) 05/02/2014  . Hyperlipidemia   . Hypertension   . Neuropathic pain 05/02/2014  . Osteoarthritis of both shoulders due to rotator cuff injury 04/23/2016  . Poor mobility 08/28/2015  . 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 Spinal stenosis of lumbar region 05/02/2014   S/p decompression at [facility] November 2015     PAST SURGICAL HISTOIRY:   Past Surgical History:  Procedure Laterality Date  . ANKLE SURGERY Left 2014  . BACK SURGERY  2015   [facility]   . BUNIONECTOMY Right   . COLONOSCOPY    . EYE SURGERY     cataract  . JOINT REPLACEMENT    . REVERSE SHOULDER ARTHROPLASTY  Right 01/11/2016   Procedure: REVERSE SHOULDER ARTHROPLASTY;  Surgeon: Justice Britain, MD;  Location: [facility];  Service: Orthopedics;  Laterality: Right;  . SHOULDER SURGERY  01/11/2016   REVERSE SHOULDER ARTHROPLASTY on 01/11/2016  . SPINE SURGERY     done at [facility] Apr 26, 2014, lumbar decompression  . TOTAL HIP ARTHROPLASTY Bilateral 2005, 2009   [place] , [place]    SOCIAL HISTORY:   Social History   Tobacco Use  . Smoking status: Former Smoker    Packs/day: 1.00    Years: 20.00    Pack years: 20.00    Types: Cigarettes    Last attempt to quit: 04/16/1967    Years since quitting: 50.8  . Smokeless tobacco: Never Used  . Tobacco comment: quit somking in early  1970's  Substance Use Topics  . Alcohol use: No    Alcohol/week: 0.0 standard drinks    FAMILY HISTORY:   Family History  Problem Relation Age of Onset  . Arthritis Mother        osteoarthritis  . Colon cancer Mother   . Arthritis Father        osteoarthritis  . Parkinson's disease Father   . Anesthesia problems Neg Hx     DRUG ALLERGIES:  No Known Allergies  REVIEW OF SYSTEMS:   Review of Systems  Constitutional: Negative for fever and weight loss.  HENT: Negative for congestion, nosebleeds and tinnitus.   Eyes: Negative for blurred vision, double vision and redness.  Respiratory:  Negative for cough, hemoptysis and shortness of breath.   Cardiovascular: Negative for chest pain, orthopnea, leg swelling and PND.  Gastrointestinal: Negative for abdominal pain, diarrhea, melena, nausea and vomiting.  Genitourinary: Negative for dysuria, hematuria and urgency.  Musculoskeletal: Negative for falls and joint pain.  Neurological: Negative for dizziness, tingling, sensory change, focal weakness, seizures, weakness and headaches.  Endo/Heme/Allergies: Negative for polydipsia. Does not bruise/bleed easily.  Psychiatric/Behavioral: Negative for depression and memory loss. The patient is not nervous/anxious.       MEDICATIONS AT HOME:   Prior to Admission medications   Medication Sig Start Date End Date Taking? Authorizing Provider  atorvastatin (LIPITOR) 10 MG tablet Take 1 tablet (10 mg total) by mouth at bedtime. For high cholesterol 11/19/17  Yes Roma Schanz R, DO  celecoxib (CELEBREX) 200 MG capsule Take 1 capsule (200 mg total) by mouth daily. 06/03/16  Yes Ann Held, DO  Cholecalciferol (VITAMIN D3) 5000 units CAPS Take 5,000 Units by mouth at bedtime.    Yes [provider]  furosemide (LASIX) 40 MG tablet Take 1 tablet (40 mg total) by mouth daily. 11/19/17  Yes Roma Schanz R, DO  gabapentin (NEURONTIN) 100 MG capsule Take 1 capsule (100 mg total) by mouth 3 (three) times daily. 11/19/17 11/19/18 Yes Ann Held, DO  losartan (COZAAR) 100 MG tablet Take 1 tablet (100 mg total) by mouth daily. 11/19/17  Yes Roma Schanz R, DO  montelukast (SINGULAIR) 10 MG tablet Take 1 tablet (10 mg total) by mouth at bedtime. 11/19/17  Yes Ann Held, DO  Multiple Vitamins-Minerals (CENTRUM SILVER) tablet Take 1 tablet by mouth daily.   Yes [provider]  pantoprazole (PROTONIX) 40 MG tablet Take 1 tablet (40 mg total) by mouth daily. 11/19/17  Yes Roma Schanz R, DO  SANTYL ointment Apply 1 application topically daily. 12/18/17  Yes [provider]  senna-docusate (SENOKOT-S) 8.6-50 MG per tablet Take 1 tablet by mouth at bedtime.    Yes [provider]  traMADol (ULTRAM) 50 MG tablet Take 1 tablet (50 mg total) by mouth 3 (three) times daily. 11/19/17  Yes Ann Held, DO  Misc. Devices [facility]) MISC Use as directed 05/02/17   Carollee Herter, [name], DO  NONFORMULARY OR COMPOUNDED ITEM Lift recliner #1  As directed  Dx spinal stenosis, osteoarthritis both shoulder ,  Low ext weakness 07/18/17   Carollee Herter, [name], DO      VITAL SIGNS:  Blood pressure (!) 98/51, pulse 73, temperature (!) 97.5 F  (36.4 C), temperature source Oral, resp. rate 20, height 5\' 4"  (1.626 m), weight 79.4 kg, SpO2 100 %.  PHYSICAL EXAMINATION:  GENERAL:  84 y.o.-year-old patient lying in the bed in no acute distress.  EYES: Pupils equal, round, reactive to light and accommodation. No scleral icterus. Extraocular muscles intact.  HEENT: Head atraumatic, normocephalic. Oropharynx and nasopharynx clear.  NECK:  Supple, no jugular venous distention. No thyroid enlargement, no tenderness.  LUNGS: Normal breath sounds bilaterally, no wheezing, rales, rhonchi . No use of accessory muscles of respiration.  CARDIOVASCULAR: S1, S2, RRR. No murmurs, rubs, gallops, clicks.  ABDOMEN: Soft, nontender, nondistended. Bowel sounds present. No organomegaly or mass.  EXTREMITIES: No pedal edema, cyanosis, or clubbing. Faint dorsalis pedis pulse on the right left foot.   NEUROLOGIC: Cranial nerves II through XII are intact. No focal motor or sensory deficits appreciated bilaterally. Globally weak.  PSYCHIATRIC: The patient is  alert and oriented x 3.  SKIN: No obvious rash, lesion, or ulcer.   LABORATORY PANEL:   CBC Recent Labs  Lab 02/03/18 1615  WBC 8.0  HGB 9.5*  HCT 28.3*  PLT 210   ------------------------------------------------------------------------------------------------------------------  Chemistries  Recent Labs  Lab 02/03/18 1048  BUN 39*  CREATININE 0.74   ------------------------------------------------------------------------------------------------------------------  Cardiac Enzymes No results for input(s): TROPONINI in the last 168 hours. ------------------------------------------------------------------------------------------------------------------  RADIOLOGY:  Dg Chest Port 1 View  Result Date: 02/03/2018 CLINICAL DATA:  Central line placement EXAM: PORTABLE CHEST 1 VIEW COMPARISON:  07/18/2017 FINDINGS: Right shoulder replacement. Surgical hardware in the cervical spine and lower  thoracic spine. Right-sided central venous catheter tip overlies the proximal right atrium. There is no pneumothorax identified. Mild reticular opacity suspect for fibrosis. Mild cardiomegaly with aortic atherosclerosis. No pneumothorax. Advanced arthritis of the left shoulder. IMPRESSION: 1. Right-sided central venous catheter tip overlies the proximal right atrium. No pneumothorax. 2. Coarse interstitial opacity and mild reticular changes, suspect for underlying fibrosis. Electronically Signed   By: Donavan Foil M.D.   On: 02/03/2018 19:17     IMPRESSION AND PLAN:   85 year old female with past medical history of essential hypertension, hyperlipidemia, neuropathy, osteoarthritis, peripheral vascular disease, GERD who underwent angiogram with angioplasty to the left tibioperoneal trunk and peroneal artery and postoperatively noted to be hypotensive.  1.  Hypotension- secondary to mild volume loss from surgery combined with some mild dehydration. - Continue IV fluids, will hold her losartan, Lasix for now.  Once her blood pressure improves we can resume her antihypertensives.  Patient is clinically asymptomatic. - Follow hemoglobin postoperatively and if needed transfuse.  2.  Essential hypertension-hold patient's Lasix, losartan given the relative hypotension as mentioned above.  3.  Peripheral vascular disease status post angiogram and intervention and to the left tibioperoneal trunk and peroneal artery-continue further care as per vascular surgery.  Continue heparin drip, continue Plavix.  4.  GERD-continue Protonix.  5.  Neuropathy-continue gabapentin.  Thank you so much for the consultation, will follow along with you.  All the records are reviewed and case discussed with Consulting provider. Management plans discussed with the patient, family and they are in agreement.  CODE STATUS: Full code  TOTAL TIME TAKING CARE OF THIS PATIENT: 40 minutes.    Henreitta Leber M.D on 02/03/2018  at 9:20 PM  Between 7am to 6pm - Pager - [phone]  After 6pm go to [email or web address] - password [email or web address] [facility]  Office  [phone]  CC: Primary care Physician: Carollee Herter, [name], DO

## 2018-02-04 ENCOUNTER — Encounter: Payer: Self-pay | Admitting: Vascular Surgery

## 2018-02-04 DIAGNOSIS — L98423 Non-pressure chronic ulcer of back with necrosis of muscle: Secondary | ICD-10-CM | POA: Diagnosis not present

## 2018-02-04 DIAGNOSIS — I959 Hypotension, unspecified: Secondary | ICD-10-CM | POA: Diagnosis not present

## 2018-02-04 DIAGNOSIS — I10 Essential (primary) hypertension: Secondary | ICD-10-CM | POA: Diagnosis not present

## 2018-02-04 DIAGNOSIS — G629 Polyneuropathy, unspecified: Secondary | ICD-10-CM | POA: Diagnosis not present

## 2018-02-04 DIAGNOSIS — I70248 Atherosclerosis of native arteries of left leg with ulceration of other part of lower left leg: Secondary | ICD-10-CM | POA: Diagnosis not present

## 2018-02-04 DIAGNOSIS — I739 Peripheral vascular disease, unspecified: Secondary | ICD-10-CM | POA: Diagnosis not present

## 2018-02-04 LAB — HEPARIN LEVEL (UNFRACTIONATED)
HEPARIN UNFRACTIONATED: 0.36 [IU]/mL (ref 0.30–0.70)
Heparin Unfractionated: 0.56 IU/mL (ref 0.30–0.70)

## 2018-02-04 NOTE — Consult Note (Signed)
ANTICOAGULATION CONSULT NOTE - Initial Consult  Pharmacy Consult for Heparin Drip  Indication: Limb Ischemia   No Known Allergies  Patient Measurements: Height: 5\' 4"  (162.6 cm) Weight: 175 lb 0.7 oz (79.4 kg) IBW/kg (Calculated) : 54.7 Heparin Dosing Weight: 72kg   Vital Signs: Temp: 97.7 F (36.5 C) (08/14 0524) Temp Source: Oral (08/14 0524) BP: 113/63 (08/14 0524) Pulse Rate: 72 (08/14 0524)  Labs: Recent Labs    02/03/18 1048 02/03/18 1615 02/04/18 0239 02/04/18 0959  HGB  --  9.5*  --   --   HCT  --  28.3*  --   --   PLT  --  210  --   --   APTT  --  123*  --   --   LABPROT  --  14.1  --   --   INR  --  1.10  --   --   HEPARINUNFRC  --   --  0.56 0.36  CREATININE 0.74  --   --   --     Estimated Creatinine Clearance: 49.6 mL/min (by C-G formula based on SCr of 0.74 mg/dL).   Medical History: Past Medical History:  Diagnosis Date  . Allergy   . Arthritis   . Cataract    Bilateral  . Essential hypertension 05/02/2014  . GERD (gastroesophageal reflux disease)   . Headache   . History of shingles   . HLD (hyperlipidemia) 05/02/2014  . Hyperlipidemia   . Hypertension   . Neuropathic pain 05/02/2014  . Osteoarthritis of both shoulders due to rotator cuff injury 04/23/2016  . Poor mobility 08/28/2015  . 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 Spinal stenosis of lumbar region 05/02/2014   S/p decompression at [facility] November 2015     Assessment: Pharmacy consulted for heparin drip dosing and monitoring in 84yo female admitted with severe atherosclerotic changes of both lower extremities. Patient now S/P angiography. Dr Delana Meyer has requested no bolus. Heparin drip was started at 900 units/hr. The second level has returned therapeutic. Baseline aPTT was elevated due to heparin being started in the cath lab.  Goal of Therapy:  Heparin level 0.3-0.7 units/ml Monitor platelets by anticoagulation protocol: Yes   Plan:   Continue heparin infusion at  900 units/hr Check anti-Xa level daily while on heparin Continue to monitor H&H and platelets   Dallie Piles, PharmD Clinical Pharmacist 02/04/2018 10:56 AM

## 2018-02-04 NOTE — Consult Note (Signed)
Patient ID: [name], female   DOB: 30-Jun-1927, 84 y.o.   MRN: [id number]  HPI Katherine Owen is a 84 y.o. female seen in consultation at the request of Dr. Delana Meyer for a sacral decubitus ulcer ( case d/w him in detail).  She was admitted yesterday for angioplasty of the left tibioperoneal trunk. He now has a sacral decubitus ulcer.  He states that apparently the pressure also has been getting deeper.  No pain no fevers no chills.  She has significant debility due to her multiple comorbidities and her advanced age.  SHe is on a heparin drip and Plavix.   HPI  Past Medical History:  Diagnosis Date  . Allergy   . Arthritis   . Cataract    Bilateral  . Essential hypertension 05/02/2014  . GERD (gastroesophageal reflux disease)   . Headache   . History of shingles   . HLD (hyperlipidemia) 05/02/2014  . Hyperlipidemia   . Hypertension   . Neuropathic pain 05/02/2014  . Osteoarthritis of both shoulders due to rotator cuff injury 04/23/2016  . Poor mobility 08/28/2015  . 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 Spinal stenosis of lumbar region 05/02/2014   S/p decompression at [facility] November 2015     Past Surgical History:  Procedure Laterality Date  . ANKLE SURGERY Left 2014  . BACK SURGERY  2015   [facility]   . BUNIONECTOMY Right   . COLONOSCOPY    . EYE SURGERY     cataract  . JOINT REPLACEMENT    . REVERSE SHOULDER ARTHROPLASTY Right 01/11/2016   Procedure: REVERSE SHOULDER ARTHROPLASTY;  Surgeon: Justice Britain, MD;  Location: [facility];  Service: Orthopedics;  Laterality: Right;  . SHOULDER SURGERY  01/11/2016   REVERSE SHOULDER ARTHROPLASTY on 01/11/2016  . SPINE SURGERY     done at [facility] Apr 26, 2014, lumbar decompression  . TOTAL HIP ARTHROPLASTY Bilateral 2005, 2009   [place] , [place]    Family History  Problem Relation Age of Onset  . Arthritis Mother        osteoarthritis  . Colon cancer Mother   . Arthritis Father        osteoarthritis  . Parkinson's disease  Father   . Anesthesia problems Neg Hx     Social History Social History   Tobacco Use  . Smoking status: Former Smoker    Packs/day: 1.00    Years: 20.00    Pack years: 20.00    Types: Cigarettes    Last attempt to quit: 04/16/1967    Years since quitting: 50.8  . Smokeless tobacco: Never Used  . Tobacco comment: quit somking in early  1970's  Substance Use Topics  . Alcohol use: No    Alcohol/week: 0.0 standard drinks  . Drug use: No    No Known Allergies  Current Facility-Administered Medications  Medication Dose Route Frequency Provider Last Rate Last Dose  . 0.9 %  sodium chloride infusion  250 mL Intravenous PRN Schnier, Dolores Lory, MD      . acetaminophen (TYLENOL) tablet 650 mg  650 mg Oral Q4H PRN Schnier, Dolores Lory, MD      . atorvastatin (LIPITOR) tablet 10 mg  10 mg Oral q1800 Schnier, Dolores Lory, MD   10 mg at 02/03/18 1739  . cholecalciferol (VITAMIN D) tablet 5,000 Units  5,000 Units Oral QHS Schnier, Dolores Lory, MD   5,000 Units at 02/03/18 2352  . clopidogrel (PLAVIX) tablet 75 mg  75 mg Oral Q breakfast  Schnier, Dolores Lory, MD   75 mg at 02/04/18 0854  . collagenase (SANTYL) ointment 1 application  1 application Topical BID Schnier, Dolores Lory, MD   1 application at 09/98/33 [id number]  . gabapentin (NEURONTIN) capsule 100 mg  100 mg Oral TID Katha Cabal, MD   100 mg at 02/04/18 0854  . heparin ADULT infusion 100 units/mL (25000 units/258mL sodium chloride 0.45%)  900 Units/hr Intravenous Continuous HallajiDani Gobble, RPH 9 mL/hr at 02/03/18 1746 900 Units/hr at 02/03/18 1746  . montelukast (SINGULAIR) tablet 10 mg  10 mg Oral QHS Schnier, Dolores Lory, MD   10 mg at 02/03/18 2352  . morphine 4 MG/ML injection 2 mg  2 mg Intravenous Q1H PRN Schnier, Dolores Lory, MD   2 mg at 02/03/18 1957  . multivitamin with minerals tablet   Oral Daily Schnier, Dolores Lory, MD   1 tablet at 02/04/18 0854  . ondansetron (ZOFRAN) injection 4 mg  4 mg Intravenous Q6H PRN Schnier, Dolores Lory,  MD      . pantoprazole (PROTONIX) EC tablet 40 mg  40 mg Oral Daily Schnier, Dolores Lory, MD   40 mg at 02/04/18 0855  . senna-docusate (Senokot-S) tablet 1 tablet  1 tablet Oral QHS Schnier, Dolores Lory, MD   1 tablet at 02/03/18 2353  . sodium chloride flush (NS) 0.9 % injection 3 mL  3 mL Intravenous Q12H Schnier, Dolores Lory, MD   3 mL at 02/04/18 0855  . sodium chloride flush (NS) 0.9 % injection 3 mL  3 mL Intravenous PRN Schnier, Dolores Lory, MD      . traMADol Veatrice Bourbon) tablet 50 mg  50 mg Oral TID Katha Cabal, MD   50 mg at 02/04/18 5397     Review of Systems Full ROS  was asked and was negative except for the information on the HPI  Physical Exam Blood pressure 113/63, pulse 72, temperature 97.7 F (36.5 C), temperature source Oral, resp. rate 18, height 5\' 4"  (1.626 m), weight 79.4 kg, SpO2 93 %. CONSTITUTIONAL: Debilitated elderly female  LYMPH NODES:  Lymph nodes in the neck are normal. RESPIRATORY:  Lungs are clear. There is normal respiratory effort, with equal breath sounds bilaterally, and without pathologic use of accessory muscles. CARDIOVASCULAR: Heart is regular without murmurs, gallops, or rubs. GI: The abdomen is  soft, nontender, and nondistended. There are no palpable masses. There is no hepatosplenomegaly. There are normal bowel sounds in all quadrants. GU: Rectal deferred.      SKIN:  2 x 2 cm sacral decubitus ulcer, no un drained collection, no evidence of necrotizing infection, this is Grade III. Some fibrinous material at the base.  NEUROLOGIC: Motor and sensation is grossly normal. Cranial nerves are grossly intact. PSYCH:  Oriented to person, place and time. Affect is normal.  Data Reviewed  I have personally reviewed the patient's imaging, laboratory findings and medical records.    Assessment/Plan Debilitated 84 year old female with significant vascular disease and significant mobility issues now presents with a sacral decubitus.  This time there is no  evidence of necrotizing fasciitis or undrained abscesses.  There is no need for emergent debridement at this time.  We will await for wound consult recommendation.  Agree with daily Santyl and turning the patient every 2 hours.  Increase mobility and increase nutritional support.  Given her debility this is definitely a difficult situation.  If the ulcer continues to worsen this may need debridement.   Caroleen Hamman, MD FACS  General Surgeon 02/04/2018, 10:23 AM

## 2018-02-04 NOTE — Progress Notes (Signed)
   02/04/18 1035  Clinical Encounter Type  Visited With Patient not available  Visit Type Initial;Spiritual support  Referral From Nurse  Consult/Referral To Chaplain  Spiritual Encounters  Spiritual Needs Other (Comment)   CH received an OR to talk with the patient about an AD. Upon visiting the room I discovered that the patient was working with PT. I will check back with the patient later today.

## 2018-02-04 NOTE — Evaluation (Signed)
Physical Therapy Evaluation Patient Details Name: Katherine Owen MRN: [id number] DOB: 1928/02/17 Today's Date: 02/04/2018   History of Present Illness  Katherine Owen  is a 84 y.o. female with a known history of essential hypertension, hyperlipidemia, GERD, spinal stenosis, peripheral vascular disease who was admitted to the vascular surgery service after having an angiogram and  angioplasty of the left tibial peroneal trunk. Systolic blood pressures in the 90's.Patient was asymptomatic.  Patient denies any dizziness, shortness of breath, chest pain or any other associated symptoms. Pt has no active control of BLE. W/C bound  Clinical Impression  At baseline pt is functioning in home with total assistance from family and pt is w/c bound, using a stedy like transfer lift to transfer from bed/chair to w/c. Pt family assist with all activties in home and dresses and bathes patient. Pt has ramp into house and requires family to push her into and out of home. Pt has been safely living with these restrictions for 1+ year. At today's PT eval pt shows no difference from baseline functioning. Pt is unable to activate BLE without therapist assist, and is max assist 2+ supine to sit. Pt shows sufficient sitting balance to allow for family to continue to be safe caregivers with regards to pt transfers and self care behaviors. Pt is at baseline functioning from therapy perspective, and will not improve from skilled therapeutic intervention. No further PT services required at this time.      Follow Up Recommendations No PT follow up    Equipment Recommendations       Recommendations for Other Services       Precautions / Restrictions Precautions Precautions: Fall Restrictions Weight Bearing Restrictions: No Other Position/Activity Restrictions: Unable to bear weight through BLE without total assist.       Mobility  Bed Mobility Overal bed mobility: Needs Assistance Bed Mobility: Sidelying to Sit    Sidelying to sit: Max assist;+2 for physical assistance       General bed mobility comments: Pt requires trunk support and full assist at BLE to move from supine to sit. Pt can use BUE to help slightly.   Transfers Overall transfer level: Needs assistance               General transfer comment: Pt family reports pt uses stedy lift at home for all transfers.   Ambulation/Gait             General Gait Details: Pt is unable to walk.   Stairs            Wheelchair Mobility    Modified Rankin (Stroke Patients Only)       Balance Overall balance assessment: Needs assistance Sitting-balance support: Feet supported;No upper extremity supported Sitting balance-Leahy Scale: Good Sitting balance - Comments: Pt can tolerate slight perterbations and is stable in sitting w/o therapist support.      Standing balance-Leahy Scale: Zero                               Pertinent Vitals/Pain Pain Assessment: No/denies pain    Home Living Family/patient expects to be discharged to:: Skilled nursing facility Living Arrangements: Alone Available Help at Discharge: Family Type of Home: House Home Access: Stairs to enter Entrance Stairs-Rails: Right Entrance Stairs-Number of Steps: 4 Home Layout: One level Home Equipment: Wheelchair - manual;Grab bars - tub/shower Additional Comments: Steady transfer Lift. Grab bars beside bed.    Prior Function Level  of Independence: Needs assistance   Gait / Transfers Assistance Needed: Pt uses w/c for all indepndent mobility. Needs to be pushed for most activities, and for access to home. Needs 2 + assist  and steady lift for all transfers.   ADL's / Homemaking Assistance Needed: Pt requires help with all ADL's.         Hand Dominance   Dominant Hand: Right    Extremity/Trunk Assessment   Upper Extremity Assessment Upper Extremity Assessment: (BUE decreased strength and ROM due to arthritis in LUE and RC tear in  RUE)    Lower Extremity Assessment Lower Extremity Assessment: RLE deficits/detail;LLE deficits/detail RLE Deficits / Details: No active movement identifiable.  RLE Sensation: WNL RLE Coordination: WNL LLE Deficits / Details: Some active muscle contraction notable with therapist assist LLE Sensation: WNL LLE Coordination: WNL    Cervical / Trunk Assessment Cervical / Trunk Assessment: Kyphotic;Other exceptions Cervical / Trunk Exceptions: Pt has scoliotic spinal posture and incsion from previous spinal stenosis surgery.   Communication   Communication: No difficulties(Occassional hearing difficulties)  Cognition Arousal/Alertness: Awake/alert Behavior During Therapy: WFL for tasks assessed/performed Overall Cognitive Status: Within Functional Limits for tasks assessed                                        General Comments      Exercises Other Exercises Other Exercises: Bed mobility: Supine to sit: Max assist 2+; Sitting balance  Other Exercises: Passive ROM: BLE hip flex/ext; knee flex/ext, ankle flex/ext   Assessment/Plan    PT Assessment Patent does not need any further PT services  PT Problem List Decreased strength;Decreased mobility;Decreased range of motion;Decreased activity tolerance;Decreased balance;Decreased skin integrity       PT Treatment Interventions      PT Goals (Current goals can be found in the Care Plan section)       Frequency     Barriers to discharge        Co-evaluation               AM-PAC PT "6 Clicks" Daily Activity  Outcome Measure Difficulty turning over in bed (including adjusting bedclothes, sheets and blankets)?: Unable Difficulty moving from lying on back to sitting on the side of the bed? : Unable Difficulty sitting down on and standing up from a chair with arms (e.g., wheelchair, bedside commode, etc,.)?: Unable Help needed moving to and from a bed to chair (including a wheelchair)?: Total Help needed  walking in hospital room?: Total Help needed climbing 3-5 steps with a railing? : Total 6 Click Score: 6    End of Session   Activity Tolerance: Patient tolerated treatment well Patient left: in bed;with nursing/sitter in room;with call bell/phone within reach;with family/visitor present Nurse Communication: (Pt requested to be washed, and was left with nuring for patient care. ) PT Visit Diagnosis: Unsteadiness on feet (R26.81);Muscle weakness (generalized) (M62.81);Difficulty in walking, not elsewhere classified (R26.2);Other symptoms and signs involving the nervous system (R29.898)    Time: 5638-7564 PT Time Calculation (min) (ACUTE ONLY): 35 min   Charges:              Hortencia Conradi, SPT 02/04/18,1:49 PM

## 2018-02-04 NOTE — Progress Notes (Signed)
[facility]  Daily Progress Note   Subjective  - 1 Day Post-Op  Patient states her left foot is feeling better today she is now insisting her right leg is hurting terribly and of course there is a ulcer on the posterior calf.  Objective Vitals:   02/03/18 1921 02/04/18 0524 02/04/18 1355 02/04/18 1410  BP: (!) 98/51 113/63 119/62   Pulse: 73 72 79 61  Resp: 20 18 16    Temp: (!) 97.5 F (36.4 C) 97.7 F (36.5 C) 98 F (36.7 C)   TempSrc: Oral Oral Oral   SpO2: 100% 93%  92%  Weight:      Height:        Intake/Output Summary (Last 24 hours) at 02/04/2018 1629 Last data filed at 02/04/2018 1608 Gross per 24 hour  Intake 1832.85 ml  Output 1300 ml  Net 532.85 ml    PULM  Normal effort , no use of accessory muscles CV  No JVD, RRR Abd      No distended, nontender VASC  bilateral calf wounds as well as the left heel decubitus are dressed.  Pedal pulses are not palpable but the left Doppler signal is now triphasic in the posterior tibial distribution.  The left foot is hyperemic with brisk capillary refill.  Laboratory CBC    Component Value Date/Time   WBC 8.0 02/03/2018 1615   HGB 9.5 (L) 02/03/2018 1615   HCT 28.3 (L) 02/03/2018 1615   PLT 210 02/03/2018 1615    BMET    Component Value Date/Time   NA 137 12/28/2017 2055   NA 142 01/24/2016 1232   K 4.7 12/28/2017 2055   CL 101 12/28/2017 2055   CO2 26 12/28/2017 2055   GLUCOSE 91 12/28/2017 2055   BUN 39 (H) 02/03/2018 1048   BUN 13 01/24/2016 1232   CREATININE 0.74 02/03/2018 1048   CALCIUM 9.6 12/28/2017 2055   GFRNONAA >60 02/03/2018 1048   GFRAA >60 02/03/2018 1048    Assessment/Planning: POD #1 s/p successful revascularization of the left leg   I will continue the heparin for 1 more day.  We are arranging home health for her complicated wounds.  We will also try to get more appropriate mattress as well as a Roho seat cushion for her wheelchair.  She does not qualify for  skilled nursing at the present time.    Hortencia Pilar  02/04/2018, 4:29 PM

## 2018-02-04 NOTE — Care Management Obs Status (Signed)
[place] NOTIFICATION   Patient Details  Name: Katherine Owen MRN: [id number] Date of Birth: Sep 25, 1927   Medicare Observation Status Notification Given:  Yes    Beverly Sessions, RN 02/04/2018, 5:23 PM

## 2018-02-04 NOTE — Progress Notes (Signed)
[place] [facility] [place] NAME: Katherine Owen    MR#:  [id number]  DATE OF BIRTH:  Dec 05, 1927  SUBJECTIVE:  CHIEF COMPLAINT: Patient denies any complaints and resting comfortably  REVIEW OF SYSTEMS:  CONSTITUTIONAL: No fever, fatigue or weakness.  EYES: No blurred or double vision.  EARS, NOSE, AND THROAT: No tinnitus or ear pain.  RESPIRATORY: No cough, shortness of breath, wheezing or hemoptysis.  CARDIOVASCULAR: No chest pain, orthopnea, edema.  GASTROINTESTINAL: No nausea, vomiting, diarrhea or abdominal pain.  GENITOURINARY: No dysuria, hematuria.  ENDOCRINE: No polyuria, nocturia,  HEMATOLOGY: No anemia, easy bruising or bleeding SKIN: No rash or lesion. MUSCULOSKELETAL: No joint pain or arthritis.   NEUROLOGIC: No tingling, numbness, weakness.  PSYCHIATRY: No anxiety or depression.   DRUG ALLERGIES:  No Known Allergies  VITALS:  Blood pressure 119/62, pulse 61, temperature 98 F (36.7 C), temperature source Oral, resp. rate 16, height 5\' 4"  (1.626 m), weight 79.4 kg, SpO2 92 %.  PHYSICAL EXAMINATION:  GENERAL:  84 y.o.-year-old patient lying in the bed with no acute distress.  EYES: Pupils equal, round, reactive to light and accommodation. No scleral icterus. Extraocular muscles intact.  HEENT: Head atraumatic, normocephalic. Oropharynx and nasopharynx clear.  NECK:  Supple, no jugular venous distention. No thyroid enlargement, no tenderness.  LUNGS: Normal breath sounds bilaterally, no wheezing, rales,rhonchi or crepitation. No use of accessory muscles of respiration.  CARDIOVASCULAR: S1, S2 normal. No murmurs, rubs, or gallops.  ABDOMEN: Soft, nontender, nondistended. Bowel sounds present. EXTREMITIES: No pedal edema, cyanosis, or clubbing.  NEUROLOGIC: Cranial nerves II through XII are intact. Muscle strength global weakness in all extremities. Sensation intact. Gait not checked.  PSYCHIATRIC: The patient is alert and  oriented x 3.  SKIN: No obvious rash, lesion, or ulcer.    LABORATORY PANEL:   CBC Recent Labs  Lab 02/03/18 1615  WBC 8.0  HGB 9.5*  HCT 28.3*  PLT 210   ------------------------------------------------------------------------------------------------------------------  Chemistries  Recent Labs  Lab 02/03/18 1048  BUN 39*  CREATININE 0.74   ------------------------------------------------------------------------------------------------------------------  Cardiac Enzymes No results for input(s): TROPONINI in the last 168 hours. ------------------------------------------------------------------------------------------------------------------  RADIOLOGY:  Dg Chest Port 1 View  Result Date: 02/03/2018 CLINICAL DATA:  Central line placement EXAM: PORTABLE CHEST 1 VIEW COMPARISON:  07/18/2017 FINDINGS: Right shoulder replacement. Surgical hardware in the cervical spine and lower thoracic spine. Right-sided central venous catheter tip overlies the proximal right atrium. There is no pneumothorax identified. Mild reticular opacity suspect for fibrosis. Mild cardiomegaly with aortic atherosclerosis. No pneumothorax. Advanced arthritis of the left shoulder. IMPRESSION: 1. Right-sided central venous catheter tip overlies the proximal right atrium. No pneumothorax. 2. Coarse interstitial opacity and mild reticular changes, suspect for underlying fibrosis. Electronically Signed   By: Donavan Foil M.D.   On: 02/03/2018 19:17    EKG:   Orders placed or performed during the hospital encounter of 05/12/17  . EKG 12-Lead  . EKG 12-Lead  . ED EKG  . ED EKG    ASSESSMENT AND PLAN:    84 year old female with past medical history of essential hypertension, hyperlipidemia, neuropathy, osteoarthritis, peripheral vascular disease, GERD who underwent angiogram with angioplasty to the left tibioperoneal trunk and peroneal artery and postoperatively noted to be hypotensive.  1.  Hypotension-  secondary to  volume loss from surgery combined with some mild dehydration. - Continue IV fluids, will hold her losartan, Lasix for now.  Might consider to resume her blood pressure medications tomorrow  if blood pressure is stable   Patient is clinically asymptomatic. - Follow hemoglobin 9.5 postoperatively and if needed transfuse.  2.  Essential hypertension-hold patient's Lasix, losartan given the relative hypotension as mentioned above.  3.  Peripheral vascular disease status post angiogram and intervention and to the left tibioperoneal trunk and peroneal artery-continue further care as per vascular surgery. -  Continue heparin drip, continue Plavix.  4.  GERD-continue Protonix.  5.  Neuropathy-continue gabapentin.     All the records are reviewed and case discussed with Care Management/Social Workerr. Management plans discussed with the patient, son and daughter-in-law at bedside and they are in agreement.  CODE STATUS: Full code  TOTAL TIME TAKING CARE OF THIS PATIENT: 36  minutes.     Note: This dictation was prepared with Dragon dictation along with smaller phrase technology. Any transcriptional errors that result from this process are unintentional.   Nicholes Mango M.D on 02/04/2018 at 2:24 PM  Between 7am to 6pm - Pager - [phone] After 6pm go to [email or web address] - password [email or web address] [facility]  [facility]  Office  [phone]  CC: Primary care physician; Ann Held, DO

## 2018-02-04 NOTE — Consult Note (Addendum)
[place] Nurse wound consult note Reason for Consult:Unstageable pressure injury to sacrum Full thickness vascular wounds to bilateral lateral legs and left heel Seen at wound care center Wound type:pressure and vascular Surgery has evaluated sacral wound and does not feel surgical debridement is indicated at this time. Will continue enzymatic debridement Pressure Injury POA: Yes Measurement: sacrum:  5 cm x 3.4 cm x 1 cm depth visible but still 100% slough Left anterior leg:  1.3 cm x 0.6 cm x 0.2 cm pale pink nongranulating Left heel with 1 cm eschar, paint with betadine daily.  Right anterior leg:  2 cm x 1 cm x 0.2 cm pale pink wound bed Wound bed:see above Drainage (amount, consistency, odor) minimal serosanguinous Periwound:intact Dressing procedure/placement/frequency:Cleanse wound to left heel and left second toe with soap and water and pat dry. Paint with betadine daily. Cleanse wounds to bilateral lateral legs and sacrum with NS.  Apply Santyl to wound bed. Cover with NS moist gauze and cover with dry dressing.  Change daily.  Will not follow at this time.  Please re-consult if needed.  Domenic Moras RN BSN [place] Pager [phone]

## 2018-02-04 NOTE — Consult Note (Signed)
ANTICOAGULATION CONSULT NOTE - Initial Consult  Pharmacy Consult for Heparin Drip  Indication: Limb Ischemia   No Known Allergies  Patient Measurements: Height: 5\' 4"  (162.6 cm) Weight: 175 lb 0.7 oz (79.4 kg) IBW/kg (Calculated) : 54.7 Heparin Dosing Weight: 72kg   Vital Signs: Temp: 97.5 F (36.4 C) (08/13 1921) Temp Source: Oral (08/13 1921) BP: 98/51 (08/13 1921) Pulse Rate: 73 (08/13 1921)  Labs: Recent Labs    02/03/18 1048 02/03/18 1615 02/04/18 0239  HGB  --  9.5*  --   HCT  --  28.3*  --   PLT  --  210  --   APTT  --  123*  --   LABPROT  --  14.1  --   INR  --  1.10  --   HEPARINUNFRC  --   --  0.56  CREATININE 0.74  --   --     Estimated Creatinine Clearance: 49.6 mL/min (by C-G formula based on SCr of 0.74 mg/dL).   Medical History: Past Medical History:  Diagnosis Date  . Allergy   . Arthritis   . Cataract    Bilateral  . Essential hypertension 05/02/2014  . GERD (gastroesophageal reflux disease)   . Headache   . History of shingles   . HLD (hyperlipidemia) 05/02/2014  . Hyperlipidemia   . Hypertension   . Neuropathic pain 05/02/2014  . Osteoarthritis of both shoulders due to rotator cuff injury 04/23/2016  . Poor mobility 08/28/2015  . 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 Spinal stenosis of lumbar region 05/02/2014   S/p decompression at [facility] November 2015     Assessment: Pharmacy consulted for heparin drip dosing and monitoring in 84yo female admitted with severe atherosclerotic changes of both lower extremities. Patient now S/P angiography.  Goal of Therapy:  Heparin level 0.3-0.7 units/ml Monitor platelets by anticoagulation protocol: Yes   Plan:  Baseline labs ordered, Dosing Wt 72kg  Give 0 units bolus per Dr. Delana Meyer  Start heparin infusion at 900 units/hr Check anti-Xa level in 8 hours and daily while on heparin Continue to monitor H&H and platelets  08/14 @ 0230 HL 0.56 therapeutic. Will continue current rate  and will recheck anti-Xa @ 1000.  Tobie Lords, PharmD, BCPS Clinical Pharmacist 02/04/2018 4:34 AM

## 2018-02-04 NOTE — Clinical Social Work Note (Addendum)
Consult received by nurse for "placement." CSW will await PT evaluation. Currently patient's status is observation and thus if placement is recommended, patient would need to pay out of pocket as medicare observation does not cover short term rehab. Shela Leff MSW,LcSW [phone]

## 2018-02-04 NOTE — Progress Notes (Signed)
   02/04/18 1320  Clinical Encounter Type  Visited With Patient and family together  Visit Type Follow-up;Spiritual support  Referral From Nurse  Consult/Referral To Chaplain  Spiritual Encounters  Spiritual Needs Other (Comment)   [facility] followed up on an OR to educate the patient on the AD process. I had attempted earlier today and the patient was receiving treatment. The patient stated that she did not think a HCPOA was necessary. Ms. Runquist shared that her children know what she wants and she is ok with that. I offered to come back again if she changed her mind.

## 2018-02-05 ENCOUNTER — Observation Stay: Payer: Medicare Other

## 2018-02-05 DIAGNOSIS — I10 Essential (primary) hypertension: Secondary | ICD-10-CM | POA: Diagnosis present

## 2018-02-05 DIAGNOSIS — I739 Peripheral vascular disease, unspecified: Secondary | ICD-10-CM | POA: Diagnosis not present

## 2018-02-05 DIAGNOSIS — Z8261 Family history of arthritis: Secondary | ICD-10-CM | POA: Diagnosis not present

## 2018-02-05 DIAGNOSIS — Z96611 Presence of right artificial shoulder joint: Secondary | ICD-10-CM | POA: Diagnosis present

## 2018-02-05 DIAGNOSIS — R279 Unspecified lack of coordination: Secondary | ICD-10-CM | POA: Diagnosis not present

## 2018-02-05 DIAGNOSIS — R109 Unspecified abdominal pain: Secondary | ICD-10-CM | POA: Diagnosis not present

## 2018-02-05 DIAGNOSIS — M1990 Unspecified osteoarthritis, unspecified site: Secondary | ICD-10-CM | POA: Diagnosis present

## 2018-02-05 DIAGNOSIS — M19011 Primary osteoarthritis, right shoulder: Secondary | ICD-10-CM | POA: Diagnosis not present

## 2018-02-05 DIAGNOSIS — I70213 Atherosclerosis of native arteries of extremities with intermittent claudication, bilateral legs: Secondary | ICD-10-CM | POA: Diagnosis not present

## 2018-02-05 DIAGNOSIS — Z79899 Other long term (current) drug therapy: Secondary | ICD-10-CM | POA: Diagnosis not present

## 2018-02-05 DIAGNOSIS — L89329 Pressure ulcer of left buttock, unspecified stage: Secondary | ICD-10-CM | POA: Diagnosis not present

## 2018-02-05 DIAGNOSIS — K5903 Drug induced constipation: Secondary | ICD-10-CM | POA: Diagnosis not present

## 2018-02-05 DIAGNOSIS — S81802D Unspecified open wound, left lower leg, subsequent encounter: Secondary | ICD-10-CM | POA: Diagnosis not present

## 2018-02-05 DIAGNOSIS — I70248 Atherosclerosis of native arteries of left leg with ulceration of other part of lower left leg: Secondary | ICD-10-CM | POA: Diagnosis not present

## 2018-02-05 DIAGNOSIS — L98499 Non-pressure chronic ulcer of skin of other sites with unspecified severity: Secondary | ICD-10-CM | POA: Diagnosis not present

## 2018-02-05 DIAGNOSIS — Z79891 Long term (current) use of opiate analgesic: Secondary | ICD-10-CM | POA: Diagnosis not present

## 2018-02-05 DIAGNOSIS — K219 Gastro-esophageal reflux disease without esophagitis: Secondary | ICD-10-CM | POA: Diagnosis present

## 2018-02-05 DIAGNOSIS — E785 Hyperlipidemia, unspecified: Secondary | ICD-10-CM | POA: Diagnosis present

## 2018-02-05 DIAGNOSIS — Z87891 Personal history of nicotine dependence: Secondary | ICD-10-CM | POA: Diagnosis not present

## 2018-02-05 DIAGNOSIS — M48061 Spinal stenosis, lumbar region without neurogenic claudication: Secondary | ICD-10-CM | POA: Diagnosis not present

## 2018-02-05 DIAGNOSIS — S91302D Unspecified open wound, left foot, subsequent encounter: Secondary | ICD-10-CM | POA: Diagnosis not present

## 2018-02-05 DIAGNOSIS — I70211 Atherosclerosis of native arteries of extremities with intermittent claudication, right leg: Secondary | ICD-10-CM | POA: Diagnosis not present

## 2018-02-05 DIAGNOSIS — Z7401 Bed confinement status: Secondary | ICD-10-CM | POA: Diagnosis not present

## 2018-02-05 DIAGNOSIS — M6281 Muscle weakness (generalized): Secondary | ICD-10-CM | POA: Diagnosis not present

## 2018-02-05 DIAGNOSIS — E860 Dehydration: Secondary | ICD-10-CM | POA: Diagnosis present

## 2018-02-05 DIAGNOSIS — I70243 Atherosclerosis of native arteries of left leg with ulceration of ankle: Secondary | ICD-10-CM | POA: Diagnosis present

## 2018-02-05 DIAGNOSIS — L03032 Cellulitis of left toe: Secondary | ICD-10-CM | POA: Diagnosis not present

## 2018-02-05 DIAGNOSIS — I959 Hypotension, unspecified: Secondary | ICD-10-CM | POA: Diagnosis present

## 2018-02-05 DIAGNOSIS — G839 Paralytic syndrome, unspecified: Secondary | ICD-10-CM | POA: Diagnosis present

## 2018-02-05 DIAGNOSIS — M79605 Pain in left leg: Secondary | ICD-10-CM | POA: Diagnosis not present

## 2018-02-05 DIAGNOSIS — K5900 Constipation, unspecified: Secondary | ICD-10-CM | POA: Diagnosis present

## 2018-02-05 DIAGNOSIS — J984 Other disorders of lung: Secondary | ICD-10-CM | POA: Diagnosis not present

## 2018-02-05 DIAGNOSIS — I70242 Atherosclerosis of native arteries of left leg with ulceration of calf: Secondary | ICD-10-CM | POA: Diagnosis present

## 2018-02-05 DIAGNOSIS — R112 Nausea with vomiting, unspecified: Secondary | ICD-10-CM | POA: Diagnosis not present

## 2018-02-05 DIAGNOSIS — Z96643 Presence of artificial hip joint, bilateral: Secondary | ICD-10-CM | POA: Diagnosis present

## 2018-02-05 DIAGNOSIS — G629 Polyneuropathy, unspecified: Secondary | ICD-10-CM | POA: Diagnosis present

## 2018-02-05 DIAGNOSIS — I70232 Atherosclerosis of native arteries of right leg with ulceration of calf: Secondary | ICD-10-CM | POA: Diagnosis present

## 2018-02-05 LAB — HEPARIN LEVEL (UNFRACTIONATED)
HEPARIN UNFRACTIONATED: 0.61 [IU]/mL (ref 0.30–0.70)
Heparin Unfractionated: 0.87 IU/mL — ABNORMAL HIGH (ref 0.30–0.70)

## 2018-02-05 LAB — BASIC METABOLIC PANEL
Anion gap: 7 (ref 5–15)
BUN: 14 mg/dL (ref 8–23)
CALCIUM: 9.4 mg/dL (ref 8.9–10.3)
CHLORIDE: 106 mmol/L (ref 98–111)
CO2: 27 mmol/L (ref 22–32)
CREATININE: 0.42 mg/dL — AB (ref 0.44–1.00)
GFR calc non Af Amer: >60 mL/min (ref >60)
GLUCOSE: 92 mg/dL (ref 70–99)
Potassium: 4.7 mmol/L (ref 3.5–5.1)
Sodium: 140 mmol/L (ref 135–145)

## 2018-02-05 LAB — CBC
HCT: 29.4 % — ABNORMAL LOW (ref 35.0–47.0)
Hemoglobin: 10.1 g/dL — ABNORMAL LOW (ref 12.0–16.0)
MCH: 27.6 pg (ref 26.0–34.0)
MCHC: 34.3 g/dL (ref 32.0–36.0)
MCV: 80.7 fL (ref 80.0–100.0)
PLATELETS: 221 10*3/uL (ref 150–440)
RBC: 3.65 MIL/uL — ABNORMAL LOW (ref 3.80–5.20)
RDW: 14 % (ref 11.5–14.5)
WBC: 8.3 10*3/uL (ref 3.6–11.0)

## 2018-02-05 NOTE — Care Management (Signed)
Patienthassacral decubitus ulcer whichrequiresbodytobepositionedinways  notfeasiblewithanormalbed.Headmustbeelevatedatleast30 degrees. sacral decubitus ulcer frequently requires changesinbodypositionwhichcannotbeachievedwithanormalbed.

## 2018-02-05 NOTE — Care Management Note (Signed)
Case Management Note  Patient Details  Name: Katherine Owen MRN: [id number] Date of Birth: [birth year]   Patient POD #2 s/p successful left lower extremity.   Assessment completed with son. Patient lives at home alone.  Son and daughter in law live locally and come check on the patient daily.  Patient is open with [facility].  Sarah with [facility] aware of admission.  Per son patient has a WC and transport lift char in the home.  Orders have been placed for hospital bed, low air loss mattress, and wheelchair cushion. Will require resumption orders at discharge. RNCM following  Subjective/Objective:                    Action/Plan:   Expected Discharge Date:  02/05/18               Expected Discharge Plan:     In-House Referral:     Discharge planning Services     Post Acute Care Choice:    Choice offered to:     DME Arranged:    DME Agency:     HH Arranged:    HH Agency:     Status of Service:     If discussed at [employer], dates discussed:    Additional Comments:  Katherine Sessions, RN 02/05/2018, 4:48 PM

## 2018-02-05 NOTE — Consult Note (Signed)
ANTICOAGULATION CONSULT NOTE - Initial Consult  Pharmacy Consult for Heparin Drip  Indication: Limb Ischemia   No Known Allergies  Patient Measurements: Height: 5\' 4"  (162.6 cm) Weight: 175 lb 0.7 oz (79.4 kg) IBW/kg (Calculated) : 54.7 Heparin Dosing Weight: 72kg   Vital Signs: Temp: 98 F (36.7 C) (08/15 0535) Temp Source: Oral (08/15 0535) BP: 128/75 (08/15 0535) Pulse Rate: 68 (08/15 0535)  Labs: Recent Labs    02/03/18 1048 02/03/18 1615 02/04/18 0239 02/04/18 0959 02/05/18 0844  HGB  --  9.5*  --   --   --   HCT  --  28.3*  --   --   --   PLT  --  210  --   --   --   APTT  --  123*  --   --   --   LABPROT  --  14.1  --   --   --   INR  --  1.10  --   --   --   HEPARINUNFRC  --   --  0.56 0.36 0.87*  CREATININE 0.74  --   --   --   --     Estimated Creatinine Clearance: 49.6 mL/min (by C-G formula based on SCr of 0.74 mg/dL).   Medical History: Past Medical History:  Diagnosis Date  . Allergy   . Arthritis   . Cataract    Bilateral  . Essential hypertension 05/02/2014  . GERD (gastroesophageal reflux disease)   . Headache   . History of shingles   . HLD (hyperlipidemia) 05/02/2014  . Hyperlipidemia   . Hypertension   . Neuropathic pain 05/02/2014  . Osteoarthritis of both shoulders due to rotator cuff injury 04/23/2016  . Poor mobility 08/28/2015  . 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 Spinal stenosis of lumbar region 05/02/2014   S/p decompression at [facility] November 2015     Assessment: Pharmacy consulted for heparin drip dosing and monitoring in 84yo female admitted with severe atherosclerotic changes of both lower extremities. Patient now S/P angiography. Dr Delana Meyer has requested no bolus. Heparin drip was started at 900 units/hr. The second level has returned therapeutic. Baseline aPTT was elevated due to heparin being started in the cath lab. Her 24 hour heparin level is supratherapeutic. The heparin drip is likely to be discontinued  today but surgery has not evaluated the patient yet  Goal of Therapy:  Heparin level 0.3-0.7 units/ml Monitor platelets by anticoagulation protocol: Yes   Plan:   Decrease heparin infusion rate to 800 units/hr Check anti-Xa level in 8 hours and daily while on heparin Continue to monitor H&H and platelets   Dallie Piles, PharmD Clinical Pharmacist 02/05/2018 9:39 AM

## 2018-02-05 NOTE — Progress Notes (Signed)
Patient complaining of back pain at shift change during bedside reporting.  I told patient I would look in her chart to see if it was time for pain medication.  I came back into room and patient was asleep and so I did not wake her to give her pain medication.  Will continue to follow.

## 2018-02-05 NOTE — Progress Notes (Signed)
[facility] [place] NAME: Katherine Owen    MR#:  [id number]  DATE OF BIRTH:  1928-06-18  SUBJECTIVE:  CHIEF COMPLAINT: Patient is tolerating diet today.  Denies any complaints and family members at bedside  REVIEW OF SYSTEMS:  CONSTITUTIONAL: No fever, fatigue or weakness.  EYES: No blurred or double vision.  EARS, NOSE, AND THROAT: No tinnitus or ear pain.  RESPIRATORY: No cough, shortness of breath, wheezing or hemoptysis.  CARDIOVASCULAR: No chest pain, orthopnea, edema.  GASTROINTESTINAL: No nausea, vomiting, diarrhea or abdominal pain.  GENITOURINARY: No dysuria, hematuria.  ENDOCRINE: No polyuria, nocturia,  HEMATOLOGY: No anemia, easy bruising or bleeding SKIN: No rash or lesion. MUSCULOSKELETAL: No joint pain or arthritis.   NEUROLOGIC: No tingling, numbness, weakness.  PSYCHIATRY: No anxiety or depression.   DRUG ALLERGIES:  No Known Allergies  VITALS:  Blood pressure 132/69, pulse 71, temperature 98.2 F (36.8 C), temperature source Oral, resp. rate 16, height 5\' 4"  (1.626 m), weight 79.4 kg, SpO2 100 %.  PHYSICAL EXAMINATION:  GENERAL:  84 y.o.-year-old patient lying in the bed with no acute distress.  EYES: Pupils equal, round, reactive to light and accommodation. No scleral icterus. Extraocular muscles intact.  HEENT: Head atraumatic, normocephalic. Oropharynx and nasopharynx clear.  NECK:  Supple, no jugular venous distention. No thyroid enlargement, no tenderness.  LUNGS: Normal breath sounds bilaterally, no wheezing, rales,rhonchi or crepitation. No use of accessory muscles of respiration.  CARDIOVASCULAR: S1, S2 normal. No murmurs, rubs, or gallops.  ABDOMEN: Soft, nontender, nondistended. Bowel sounds present. EXTREMITIES: No pedal edema, cyanosis, or clubbing.  NEUROLOGIC: Cranial nerves II through XII are intact. Muscle strength global weakness in all extremities. Sensation intact. Gait not checked.   PSYCHIATRIC: The patient is alert and oriented x 3.  SKIN: No obvious rash, lesion, or ulcer.    LABORATORY PANEL:   CBC Recent Labs  Lab 02/05/18 1032  WBC 8.3  HGB 10.1*  HCT 29.4*  PLT 221   ------------------------------------------------------------------------------------------------------------------  Chemistries  Recent Labs  Lab 02/05/18 1032  NA 140  K 4.7  CL 106  CO2 27  GLUCOSE 92  BUN 14  CREATININE 0.42*  CALCIUM 9.4   ------------------------------------------------------------------------------------------------------------------  Cardiac Enzymes No results for input(s): TROPONINI in the last 168 hours. ------------------------------------------------------------------------------------------------------------------  RADIOLOGY:  Dg Chest 2 View  Result Date: 02/05/2018 CLINICAL DATA:  Abdominal pain. EXAM: CHEST - 2 VIEW COMPARISON:  Radiograph of February 03, 2018. FINDINGS: The heart size and mediastinal contours are within normal limits. No pneumothorax or pleural effusion is noted. Right internal jugular catheter is unchanged with tip in right atrium. Atherosclerosis of thoracic aorta is noted. Stable reticular densities are noted throughout both lungs most consistent with scarring. No acute pulmonary disease is noted. Status post right shoulder arthroplasty. Severe degenerative changes seen involving the left glenohumeral joint. IMPRESSION: No active cardiopulmonary disease. Electronically Signed   By: Marijo Conception, M.D.   On: 02/05/2018 10:21   Dg Abd 1 View  Result Date: 02/05/2018 CLINICAL DATA:  Abdominal pain. EXAM: ABDOMEN - 1 VIEW COMPARISON:  CT scan of May 12, 2017. FINDINGS: The bowel gas pattern is normal. Phleboliths are noted in the pelvis. No nephrolithiasis is noted. Large calcified uterine fibroid is noted. Status post bilateral hip arthroplasties. IMPRESSION: No evidence of bowel obstruction or ileus is noted. Electronically  Signed   By: Marijo Conception, M.D.   On: 02/05/2018 10:19   Dg Chest Port 1  View  Result Date: 02/03/2018 CLINICAL DATA:  Central line placement EXAM: PORTABLE CHEST 1 VIEW COMPARISON:  07/18/2017 FINDINGS: Right shoulder replacement. Surgical hardware in the cervical spine and lower thoracic spine. Right-sided central venous catheter tip overlies the proximal right atrium. There is no pneumothorax identified. Mild reticular opacity suspect for fibrosis. Mild cardiomegaly with aortic atherosclerosis. No pneumothorax. Advanced arthritis of the left shoulder. IMPRESSION: 1. Right-sided central venous catheter tip overlies the proximal right atrium. No pneumothorax. 2. Coarse interstitial opacity and mild reticular changes, suspect for underlying fibrosis. Electronically Signed   By: Donavan Foil M.D.   On: 02/03/2018 19:17    EKG:   Orders placed or performed during the hospital encounter of 05/12/17  . EKG 12-Lead  . EKG 12-Lead  . ED EKG  . ED EKG    ASSESSMENT AND PLAN:    84 year old female with past medical history of essential hypertension, hyperlipidemia, neuropathy, osteoarthritis, peripheral vascular disease, GERD who underwent angiogram with angioplasty to the left tibioperoneal trunk and peroneal artery and postoperatively noted to be hypotensive.  1.  Hypotension- secondary to  volume loss from surgery combined with some mild dehydration. Blood pressure stable discontinue IV fluids  will hold her losartan, Lasix for now.  Might consider to resume her blood pressure medications tomorrow if blood pressure is stable   Patient is clinically asymptomatic. - Follow hemoglobin 9.5-10.1 postoperatively and if needed transfuse.  2.  Essential hypertension-hold patient's Lasix, losartan given the relative hypotension as mentioned above.  3.  Peripheral vascular disease status post angiogram and intervention and to the left tibioperoneal trunk and peroneal artery-continue further care  as per vascular surgery. -  Continue heparin drip, continue Plavix.  4.  GERD-continue Protonix.  5.  Neuropathy-continue gabapentin.   Discharge plan by attending physician, plan of care discussed with Dr. Delana Meyer  All the records are reviewed and case discussed with Care Management/Social Workerr. Management plans discussed with the patient, son and daughter-in-law at bedside and they are in agreement.  CODE STATUS: Full code  TOTAL TIME TAKING CARE OF THIS PATIENT: 36  minutes.     Note: This dictation was prepared with Dragon dictation along with smaller phrase technology. Any transcriptional errors that result from this process are unintentional.   Nicholes Mango M.D on 02/05/2018 at 4:59 PM  Between 7am to 6pm - Pager - [phone] After 6pm go to [email or web address] - password [email or web address] [facility]  [facility]  Office  [phone]  CC: Primary care physician; Ann Held, DO

## 2018-02-05 NOTE — Consult Note (Signed)
ANTICOAGULATION CONSULT NOTE - Initial Consult  Pharmacy Consult for Heparin Drip  Indication: Limb Ischemia   No Known Allergies  Patient Measurements: Height: 5\' 4"  (162.6 cm) Weight: 175 lb 0.7 oz (79.4 kg) IBW/kg (Calculated) : 54.7 Heparin Dosing Weight: 72kg   Vital Signs: Temp: 98.2 F (36.8 C) (08/15 1348) Temp Source: Oral (08/15 1348) BP: 132/69 (08/15 1348) Pulse Rate: 71 (08/15 1348)  Labs: Recent Labs    02/03/18 1048 02/03/18 1615  02/04/18 0959 02/05/18 0844 02/05/18 1032 02/05/18 1806  HGB  --  9.5*  --   --   --  10.1*  --   HCT  --  28.3*  --   --   --  29.4*  --   PLT  --  210  --   --   --  221  --   APTT  --  123*  --   --   --   --   --   LABPROT  --  14.1  --   --   --   --   --   INR  --  1.10  --   --   --   --   --   HEPARINUNFRC  --   --    < > 0.36 0.87*  --  0.61  CREATININE 0.74  --   --   --   --  0.42*  --    < > = values in this interval not displayed.    Estimated Creatinine Clearance: 49.6 mL/min (A) (by C-G formula based on SCr of 0.42 mg/dL (L)).   Medical History: Past Medical History:  Diagnosis Date  . Allergy   . Arthritis   . Cataract    Bilateral  . Essential hypertension 05/02/2014  . GERD (gastroesophageal reflux disease)   . Headache   . History of shingles   . HLD (hyperlipidemia) 05/02/2014  . Hyperlipidemia   . Hypertension   . Neuropathic pain 05/02/2014  . Osteoarthritis of both shoulders due to rotator cuff injury 04/23/2016  . Poor mobility 08/28/2015  . 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 Spinal stenosis of lumbar region 05/02/2014   S/p decompression at [facility] November 2015     Assessment: Pharmacy consulted for heparin drip dosing and monitoring in 84yo female admitted with severe atherosclerotic changes of both lower extremities. Patient now S/P angiography. Dr Delana Meyer has requested no bolus. Heparin drip was started at 900 units/hr on 8/13.   Goal of Therapy:  Heparin level 0.3-0.7  units/ml Monitor platelets by anticoagulation protocol: Yes   Plan:   8/15 @ 1806 HL therapeutic x 1  @ 0.61. Continue current heparin infusion rate of 800 units/hr. Will Check confirmatory anti-Xa level in 8 hours and daily while on heparin  Continue to monitor H&H and platelets. CBC with AM labs per protocol.   Pernell Dupre, PharmD, BCPS Clinical Pharmacist 02/05/2018 6:45 PM

## 2018-02-05 NOTE — Plan of Care (Signed)
  Problem: Safety: Goal: Ability to remain free from injury will improve Outcome: Completed/Met   Problem: Spiritual Needs Goal: Ability to function at adequate level Outcome: Completed/Met

## 2018-02-05 NOTE — Progress Notes (Signed)
[facility]  Daily Progress Note   Subjective  - 2 Days Post-Op  Today upon entering the patient's room she states she is extremely nauseated she cannot even lift a fork and did not eat her breakfast.  She thinks she may feel like she has to have a bowel movement but cannot tell.  She does admit to a bowel movement on Tuesday.  She denies any change in her lower extremities she denies pain in her lower extremities.  Objective Vitals:   02/04/18 1355 02/04/18 1410 02/04/18 2028 02/05/18 0535  BP: 119/62  122/73 128/75  Pulse: 79 61 73 68  Resp: 16  20 16   Temp: 98 F (36.7 C)  98.6 F (37 C) 98 F (36.7 C)  TempSrc: Oral  Oral Oral  SpO2:  92% 100% 100%  Weight:      Height:        Intake/Output Summary (Last 24 hours) at 02/05/2018 1255 Last data filed at 02/05/2018 1022 Gross per 24 hour  Intake 185 ml  Output 1400 ml  Net -1215 ml    PULM  Normal effort , no use of accessory muscles CV  No JVD, RRR Abd      No distended, nontender VASC  left foot remains warm with less than 1 second capillary refill.  The bilateral calf ulcers in the left heel ulcer remains stable and dry.  Laboratory CBC    Component Value Date/Time   WBC 8.3 02/05/2018 1032   HGB 10.1 (L) 02/05/2018 1032   HCT 29.4 (L) 02/05/2018 1032   PLT 221 02/05/2018 1032    BMET    Component Value Date/Time   NA 140 02/05/2018 1032   NA 142 01/24/2016 1232   K 4.7 02/05/2018 1032   CL 106 02/05/2018 1032   CO2 27 02/05/2018 1032   GLUCOSE 92 02/05/2018 1032   BUN 14 02/05/2018 1032   BUN 13 01/24/2016 1232   CREATININE 0.42 (L) 02/05/2018 1032   CALCIUM 9.4 02/05/2018 1032   GFRNONAA >60 02/05/2018 1032   GFRAA >60 02/05/2018 1032    Assessment/Planning: POD #2 s/p successful left lower extremity revascularization   I have rechecked her labs and they appear to be in good order KUB and chest x-ray are pending.  I have discussed this with Dr. Margaretmary Eddy.  Given these new  complaints I do not see that we can send her home today however I do not have a obvious explanation for her change in status this is quite different from yesterday and very unexpected.  I will hold off on IV fluids and see if she tolerates her lunch.  I have asked that they get her up to a chair and may be this will help with her GI symptoms.    Hortencia Pilar  02/05/2018, 12:55 PM

## 2018-02-06 ENCOUNTER — Telehealth: Payer: Self-pay | Admitting: *Deleted

## 2018-02-06 ENCOUNTER — Encounter: Payer: Self-pay | Admitting: Nurse Practitioner

## 2018-02-06 DIAGNOSIS — R109 Unspecified abdominal pain: Secondary | ICD-10-CM

## 2018-02-06 LAB — CBC
HEMATOCRIT: 31.2 % — AB (ref 35.0–47.0)
Hemoglobin: 10.4 g/dL — ABNORMAL LOW (ref 12.0–16.0)
MCH: 27.1 pg (ref 26.0–34.0)
MCHC: 33.4 g/dL (ref 32.0–36.0)
MCV: 80.9 fL (ref 80.0–100.0)
PLATELETS: 228 10*3/uL (ref 150–440)
RBC: 3.86 MIL/uL (ref 3.80–5.20)
RDW: 14.1 % (ref 11.5–14.5)
WBC: 8.1 10*3/uL (ref 3.6–11.0)

## 2018-02-06 LAB — HEPARIN LEVEL (UNFRACTIONATED): Heparin Unfractionated: 0.42 IU/mL (ref 0.30–0.70)

## 2018-02-06 MED ORDER — SORBITOL 70 % SOLN
30.0000 mL | Freq: Every day | Status: DC | PRN
  Filled 2018-02-06: qty 30

## 2018-02-06 MED ORDER — DOCUSATE SODIUM 100 MG PO CAPS
100.0000 mg | ORAL_CAPSULE | Freq: Two times a day (BID) | ORAL | Status: DC
  Administered 2018-02-06 – 2018-02-12 (×11): 100 mg via ORAL
  Filled 2018-02-06 (×11): qty 1

## 2018-02-06 MED ORDER — SODIUM CHLORIDE 0.9% FLUSH
10.0000 mL | INTRAVENOUS | Status: DC | PRN
  Administered 2018-02-08: 20 mL
  Filled 2018-02-06: qty 40

## 2018-02-06 MED ORDER — FLEET ENEMA 7-19 GM/118ML RE ENEM
1.0000 | ENEMA | Freq: Every day | RECTAL | Status: DC | PRN
  Administered 2018-02-07 – 2018-02-08 (×2): 1 via RECTAL
  Filled 2018-02-06 (×2): qty 1

## 2018-02-06 MED ORDER — POLYETHYLENE GLYCOL 3350 17 G PO PACK
17.0000 g | PACK | Freq: Every day | ORAL | Status: DC
  Administered 2018-02-06 – 2018-02-08 (×3): 17 g via ORAL
  Filled 2018-02-06 (×3): qty 1

## 2018-02-06 NOTE — Consult Note (Signed)
ANTICOAGULATION CONSULT NOTE   Pharmacy Consult for Heparin Drip  Indication: Limb Ischemia   No Known Allergies  Patient Measurements: Height: 5\' 4"  (162.6 cm) Weight: 175 lb 0.7 oz (79.4 kg) IBW/kg (Calculated) : 54.7 Heparin Dosing Weight: 72kg   Vital Signs: Temp: 98.5 F (36.9 C) (08/16 0434) Temp Source: Oral (08/16 0434) BP: 115/54 (08/16 0434) Pulse Rate: 73 (08/16 0434)  Labs: Recent Labs    02/03/18 1048  02/03/18 1615  02/05/18 0844 02/05/18 1032 02/05/18 1806 02/06/18 0807  HGB  --    < > 9.5*  --   --  10.1*  --  10.4*  HCT  --   --  28.3*  --   --  29.4*  --  31.2*  PLT  --   --  210  --   --  221  --  228  APTT  --   --  123*  --   --   --   --   --   LABPROT  --   --  14.1  --   --   --   --   --   INR  --   --  1.10  --   --   --   --   --   HEPARINUNFRC  --   --   --    < > 0.87*  --  0.61 0.42  CREATININE 0.74  --   --   --   --  0.42*  --   --    < > = values in this interval not displayed.    Estimated Creatinine Clearance: 49.6 mL/min (A) (by C-G formula based on SCr of 0.42 mg/dL (L)).   Medical History: Past Medical History:  Diagnosis Date  . Allergy   . Arthritis   . Cataract    Bilateral  . Essential hypertension 05/02/2014  . GERD (gastroesophageal reflux disease)   . Headache   . History of shingles   . HLD (hyperlipidemia) 05/02/2014  . Hyperlipidemia   . Hypertension   . Neuropathic pain 05/02/2014  . Osteoarthritis of both shoulders due to rotator cuff injury 04/23/2016  . Poor mobility 08/28/2015  . 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 Spinal stenosis of lumbar region 05/02/2014   S/p decompression at [facility] November 2015     Assessment: Pharmacy consulted for heparin drip dosing and monitoring in 84yo female admitted with severe atherosclerotic changes of both lower extremities. Patient now S/P angiography. Dr Delana Meyer has requested no bolus. Heparin drip was started at 900 units/hr on 8/13.   Goal of  Therapy:  Heparin level 0.3-0.7 units/ml Monitor platelets by anticoagulation protocol: Yes   Plan:   8/15 @ 1806 HL therapeutic x 1  @ 0.61. Continue current heparin infusion rate of 800 units/hr. Will Check confirmatory anti-Xa level in 8 hours and daily while on heparin  8/16 HL@0807 = 0.42. Will continue current drip rate and check next HL with am labs. Noted delay in lab draw as HL was reordered stat this am.  Continue to monitor H&H and platelets. CBC with AM labs per protocol.   Noralee Space, PharmD, BCPS Clinical Pharmacist 02/06/2018 9:13 AM

## 2018-02-06 NOTE — Progress Notes (Signed)
[place] [facility] [place] NAME: Katherine Owen    MR#:  [id number]  DATE OF BIRTH:  11/24/1927  SUBJECTIVE:  CHIEF COMPLAINT: Patient is tolerating diet ok, feels fine , OOB to chair    REVIEW OF SYSTEMS:  CONSTITUTIONAL: No fever, fatigue or weakness.  EYES: No blurred or double vision.  EARS, NOSE, AND THROAT: No tinnitus or ear pain.  RESPIRATORY: No cough, shortness of breath, wheezing or hemoptysis.  CARDIOVASCULAR: No chest pain, orthopnea, edema.  GASTROINTESTINAL: No nausea, vomiting, diarrhea or abdominal pain.  GENITOURINARY: No dysuria, hematuria.  ENDOCRINE: No polyuria, nocturia,  HEMATOLOGY: No anemia, easy bruising or bleeding SKIN: No rash or lesion. MUSCULOSKELETAL: No joint pain or arthritis.   NEUROLOGIC: No tingling, numbness, weakness.  PSYCHIATRY: No anxiety or depression.   DRUG ALLERGIES:  No Known Allergies  VITALS:  Blood pressure 109/65, pulse 77, temperature 98.7 F (37.1 C), temperature source Axillary, resp. rate 18, height 5\' 4"  (1.626 m), weight 79.4 kg, SpO2 (!) 76 %.  PHYSICAL EXAMINATION:  GENERAL:  84 y.o.-year-old patient lying in the bed with no acute distress.  EYES: Pupils equal, round, reactive to light and accommodation. No scleral icterus. Extraocular muscles intact.  HEENT: Head atraumatic, normocephalic. Oropharynx and nasopharynx clear.  NECK:  Supple, no jugular venous distention. No thyroid enlargement, no tenderness.  LUNGS: Normal breath sounds bilaterally, no wheezing, rales,rhonchi or crepitation. No use of accessory muscles of respiration.  CARDIOVASCULAR: S1, S2 normal. No murmurs, rubs, or gallops.  ABDOMEN: Soft, nontender, nondistended. Bowel sounds present. EXTREMITIES: No pedal edema, cyanosis, or clubbing.  NEUROLOGIC: Cranial nerves II through XII are intact. Muscle strength global weakness in all extremities. Sensation intact. Gait not checked.  PSYCHIATRIC: The patient  is alert and oriented x 3.  SKIN: No obvious rash, lesion, or ulcer.    LABORATORY PANEL:   CBC Recent Labs  Lab 02/06/18 0807  WBC 8.1  HGB 10.4*  HCT 31.2*  PLT 228   ------------------------------------------------------------------------------------------------------------------  Chemistries  Recent Labs  Lab 02/05/18 1032  NA 140  K 4.7  CL 106  CO2 27  GLUCOSE 92  BUN 14  CREATININE 0.42*  CALCIUM 9.4   ------------------------------------------------------------------------------------------------------------------  Cardiac Enzymes No results for input(s): TROPONINI in the last 168 hours. ------------------------------------------------------------------------------------------------------------------  RADIOLOGY:  Dg Chest 2 View  Result Date: 02/05/2018 CLINICAL DATA:  Abdominal pain. EXAM: CHEST - 2 VIEW COMPARISON:  Radiograph of February 03, 2018. FINDINGS: The heart size and mediastinal contours are within normal limits. No pneumothorax or pleural effusion is noted. Right internal jugular catheter is unchanged with tip in right atrium. Atherosclerosis of thoracic aorta is noted. Stable reticular densities are noted throughout both lungs most consistent with scarring. No acute pulmonary disease is noted. Status post right shoulder arthroplasty. Severe degenerative changes seen involving the left glenohumeral joint. IMPRESSION: No active cardiopulmonary disease. Electronically Signed   By: Marijo Conception, M.D.   On: 02/05/2018 10:21   Dg Abd 1 View  Result Date: 02/05/2018 CLINICAL DATA:  Abdominal pain. EXAM: ABDOMEN - 1 VIEW COMPARISON:  CT scan of May 12, 2017. FINDINGS: The bowel gas pattern is normal. Phleboliths are noted in the pelvis. No nephrolithiasis is noted. Large calcified uterine fibroid is noted. Status post bilateral hip arthroplasties. IMPRESSION: No evidence of bowel obstruction or ileus is noted. Electronically Signed   By: Marijo Conception,  M.D.   On: 02/05/2018 10:19    EKG:  Orders placed or performed during the hospital encounter of 05/12/17  . EKG 12-Lead  . EKG 12-Lead  . ED EKG  . ED EKG    ASSESSMENT AND PLAN:    84 year old female with past medical history of essential hypertension, hyperlipidemia, neuropathy, osteoarthritis, peripheral vascular disease, GERD who underwent angiogram with angioplasty to the left tibioperoneal trunk and peroneal artery and postoperatively noted to be hypotensive.  1.  Hypotension- secondary to  volume loss from surgery combined with some mild dehydration. Blood pressure stable discontinued IV fluids  will hold her losartan, Lasix for now.  Might consider to resume her blood pressure medications tomorrow if blood pressure is stable   Patient is clinically asymptomatic. - Follow hemoglobin 9.5-10.1-10.4 postoperatively and if needed transfuse.  2.  Essential hypertension-hold patient's Lasix, losartan given the relative hypotension as mentioned above.  3.  Peripheral vascular disease status post angiogram and intervention and to the left tibioperoneal trunk and peroneal artery-continue further care as per vascular surgery. -  Continue heparin drip, continue Plavix.. Per vascular   4.  GERD-continue Protonix.  5.  Neuropathy-continue gabapentin.   Discharge plan by attending physician, plan of care discussed with Dr. Delana Meyer  All the records are reviewed and case discussed with Care Management/Social Workerr. Management plans discussed with the patient, son and daughter-in-law at bedside and they are in agreement.  CODE STATUS: Full code  TOTAL TIME TAKING CARE OF THIS PATIENT: 28   minutes.     Note: This dictation was prepared with Dragon dictation along with smaller phrase technology. Any transcriptional errors that result from this process are unintentional.   Nicholes Mango M.D on 02/06/2018 at 1:54 PM  Between 7am to 6pm - Pager - [phone] After 6pm go to  [email or web address] - password [email or web address] [facility]  [place]  Office  [phone]  CC: Primary care physician; Ann Held, DO

## 2018-02-06 NOTE — Progress Notes (Signed)
[facility]  Daily Progress Note   Subjective  - 3 Days Post-Op  Patient still complaining of no BM.  Otherwise doing ok  Objective Vitals:   02/05/18 1348 02/05/18 1957 02/06/18 0434 02/06/18 1237  BP: 132/69 (!) 104/58 (!) 115/54 109/65  Pulse: 71 86 73 77  Resp: 16 18 20 18   Temp: 98.2 F (36.8 C) 98.8 F (37.1 C) 98.5 F (36.9 C) 98.7 F (37.1 C)  TempSrc: Oral Oral Oral Axillary  SpO2: 100% 100% 99% (!) 76%  Weight:      Height:        Intake/Output Summary (Last 24 hours) at 02/06/2018 1803 Last data filed at 02/06/2018 1345 Gross per 24 hour  Intake 1096.63 ml  Output 1000 ml  Net 96.63 ml    PULM  CTAB CV  RRR   Laboratory CBC    Component Value Date/Time   WBC 8.1 02/06/2018 0807   HGB 10.4 (L) 02/06/2018 0807   HCT 31.2 (L) 02/06/2018 0807   PLT 228 02/06/2018 0807    BMET    Component Value Date/Time   NA 140 02/05/2018 1032   NA 142 01/24/2016 1232   K 4.7 02/05/2018 1032   CL 106 02/05/2018 1032   CO2 27 02/05/2018 1032   GLUCOSE 92 02/05/2018 1032   BUN 14 02/05/2018 1032   BUN 13 01/24/2016 1232   CREATININE 0.42 (L) 02/05/2018 1032   CALCIUM 9.4 02/05/2018 1032   GFRNONAA >60 02/05/2018 1032   GFRAA >60 02/05/2018 1032    Assessment/Planning: POD #3 s/p LE revascularization   Doing well from revascularization  Will give more agents to stimulate the bowels  Hopefully discharge tomorrow    Leotis Pain  02/06/2018, 6:03 PM

## 2018-02-06 NOTE — Telephone Encounter (Signed)
Received Home Health Certification and Plan of Care; forwarded to provider/SLS 08/16

## 2018-02-07 LAB — CBC
HEMATOCRIT: 29.6 % — AB (ref 35.0–47.0)
HEMOGLOBIN: 10 g/dL — AB (ref 12.0–16.0)
MCH: 27.5 pg (ref 26.0–34.0)
MCHC: 33.6 g/dL (ref 32.0–36.0)
MCV: 81.7 fL (ref 80.0–100.0)
Platelets: 217 10*3/uL (ref 150–440)
RBC: 3.62 MIL/uL — ABNORMAL LOW (ref 3.80–5.20)
RDW: 14.5 % (ref 11.5–14.5)
WBC: 7.9 10*3/uL (ref 3.6–11.0)

## 2018-02-07 MED ORDER — MAGNESIUM HYDROXIDE 400 MG/5ML PO SUSP
15.0000 mL | Freq: Every day | ORAL | Status: DC | PRN
Start: 2018-02-07 — End: 2018-02-12
  Administered 2018-02-07: 15 mL via ORAL
  Filled 2018-02-07 (×2): qty 30

## 2018-02-07 NOTE — Progress Notes (Signed)
[place] at [place] NAME: Katherine Owen    MR#:  [id number]  DATE OF BIRTH:  31-Mar-1928  SUBJECTIVE:  CHIEF COMPLAINT: Patient is tolerating diet ok, feels fine , no BM for 4 days  REVIEW OF SYSTEMS:  CONSTITUTIONAL: No fever, fatigue or weakness.  EYES: No blurred or double vision.  EARS, NOSE, AND THROAT: No tinnitus or ear pain.  RESPIRATORY: No cough, shortness of breath, wheezing or hemoptysis.  CARDIOVASCULAR: No chest pain, orthopnea, edema.  GASTROINTESTINAL: No nausea, vomiting, diarrhea or abdominal pain.  GENITOURINARY: No dysuria, hematuria.  ENDOCRINE: No polyuria, nocturia,  HEMATOLOGY: No anemia, easy bruising or bleeding SKIN: No rash or lesion. MUSCULOSKELETAL: No joint pain or arthritis.   NEUROLOGIC: No tingling, numbness, weakness.  PSYCHIATRY: No anxiety or depression.   DRUG ALLERGIES:  No Known Allergies  VITALS:  Blood pressure 102/61, pulse 65, temperature 97.8 F (36.6 C), temperature source Oral, resp. rate 18, height 5\' 4"  (1.626 m), weight 79.4 kg, SpO2 100 %.  PHYSICAL EXAMINATION:  GENERAL:  84 y.o.-year-old patient lying in the bed with no acute distress.  EYES: Pupils equal, round, reactive to light and accommodation. No scleral icterus. Extraocular muscles intact.  HEENT: Head atraumatic, normocephalic. Oropharynx and nasopharynx clear.  NECK:  Supple, no jugular venous distention. No thyroid enlargement, no tenderness.  LUNGS: Normal breath sounds bilaterally, no wheezing, rales,rhonchi or crepitation. No use of accessory muscles of respiration.  CARDIOVASCULAR: S1, S2 normal. No murmurs, rubs, or gallops.  ABDOMEN: Soft, nontender, nondistended. Bowel sounds present. EXTREMITIES: No pedal edema, cyanosis, or clubbing.  NEUROLOGIC: Cranial nerves II through XII are intact. Muscle strength global weakness in all extremities. Sensation intact. Gait not checked.  PSYCHIATRIC: The patient is  alert and oriented x 3.  SKIN: No obvious rash, lesion, or ulcer.    LABORATORY PANEL:   CBC Recent Labs  Lab 02/07/18 0409  WBC 7.9  HGB 10.0*  HCT 29.6*  PLT 217   ------------------------------------------------------------------------------------------------------------------  Chemistries  Recent Labs  Lab 02/05/18 1032  NA 140  K 4.7  CL 106  CO2 27  GLUCOSE 92  BUN 14  CREATININE 0.42*  CALCIUM 9.4   ------------------------------------------------------------------------------------------------------------------  Cardiac Enzymes No results for input(s): TROPONINI in the last 168 hours. ------------------------------------------------------------------------------------------------------------------  RADIOLOGY:  Dg Chest 2 View  Result Date: 02/05/2018 CLINICAL DATA:  Abdominal pain. EXAM: CHEST - 2 VIEW COMPARISON:  Radiograph of February 03, 2018. FINDINGS: The heart size and mediastinal contours are within normal limits. No pneumothorax or pleural effusion is noted. Right internal jugular catheter is unchanged with tip in right atrium. Atherosclerosis of thoracic aorta is noted. Stable reticular densities are noted throughout both lungs most consistent with scarring. No acute pulmonary disease is noted. Status post right shoulder arthroplasty. Severe degenerative changes seen involving the left glenohumeral joint. IMPRESSION: No active cardiopulmonary disease. Electronically Signed   By: Marijo Conception, M.D.   On: 02/05/2018 10:21   Dg Abd 1 View  Result Date: 02/05/2018 CLINICAL DATA:  Abdominal pain. EXAM: ABDOMEN - 1 VIEW COMPARISON:  CT scan of May 12, 2017. FINDINGS: The bowel gas pattern is normal. Phleboliths are noted in the pelvis. No nephrolithiasis is noted. Large calcified uterine fibroid is noted. Status post bilateral hip arthroplasties. IMPRESSION: No evidence of bowel obstruction or ileus is noted. Electronically Signed   By: Marijo Conception,  M.D.   On: 02/05/2018 10:19    EKG:   Orders  placed or performed during the hospital encounter of 05/12/17  . EKG 12-Lead  . EKG 12-Lead  . ED EKG  . ED EKG    ASSESSMENT AND PLAN:    84 year old female with past medical history of essential hypertension, hyperlipidemia, neuropathy, osteoarthritis, peripheral vascular disease, GERD who underwent angiogram with angioplasty to the left tibioperoneal trunk and peroneal artery and postoperatively noted to be hypotensive.  1.  Hypotension- secondary to  volume loss from surgery combined with some mild dehydration. Blood pressure stable discontinued IV fluids  will hold her losartan, Lasix for now.  Might consider to resume her blood pressure medications  if blood pressure is stable   Patient is clinically asymptomatic. - Follow hemoglobin 9.5-10.1-10.4 postoperatively and if needed transfuse.  2.  Essential hypertension-hold patient's Lasix, losartan given the relative hypotension as mentioned above.  3.  Peripheral vascular disease status post angiogram and intervention and to the left tibioperoneal trunk and peroneal artery-continue further care as per vascular surgery. -   continue Plavix.. Per vascular   4.  GERD-continue Protonix.  5.  Neuropathy-continue gabapentin.  6. Constipation- MOM,lactulose and fleet enema prn  Will sign off   Discharge plan by attending physician, plan of care discussed with Dr. Delana Meyer  All the records are reviewed and case discussed with Care Management/Social Workerr. Management plans discussed with the patient, son and daughter-in-law at bedside and they are in agreement.  CODE STATUS: Full code  TOTAL TIME TAKING CARE OF THIS PATIENT: 28   minutes.     Note: This dictation was prepared with Dragon dictation along with smaller phrase technology. Any transcriptional errors that result from this process are unintentional.   Nicholes Mango M.D on 02/07/2018 at 9:56 AM  Between 7am to  6pm - Pager - [phone] After 6pm go to [email or web address] - password [email or web address] [facility]  [facility]  Office  [phone]  CC: Primary care physician; Ann Held, DO

## 2018-02-07 NOTE — Plan of Care (Signed)
Resting quietly in bed,no visual distress noted,no complaints voiced.Will continue with planned regimen,callbell within reach.

## 2018-02-07 NOTE — Progress Notes (Signed)
Vascular Surgery  Doing OK. Pain in Left heel- improved and well controlled with current regimen. No BM yet. Denies Nausea   Vitals:   02/06/18 2038 02/07/18 0428  BP: (!) 105/59 102/61  Pulse: 70 65  Resp: 18 18  Temp: 98.7 F (37.1 C) 97.8 F (36.6 C)  SpO2: 98% 100%   PULM  Normal effort , no use of accessory muscles CV       No JVD, RRR Abd      No distended, nontender VASC  left foot remains warm with less than 1 second capillary refill.  The bilateral calf ulcers in the left heel ulcer remains stable and dry.   POD #4 S/P Left tibial angioplasty  Will give new bowel regimen- brown cow, if no effect- enema. Limit narcotics OOB as tolerated Continue Plavix/ gabapentin Possible home tomorrow if good result with bowel regimen Appreciate Medicine input and care

## 2018-02-08 ENCOUNTER — Inpatient Hospital Stay: Payer: Medicare Other

## 2018-02-08 DIAGNOSIS — K5903 Drug induced constipation: Secondary | ICD-10-CM

## 2018-02-08 LAB — COMPREHENSIVE METABOLIC PANEL
ALBUMIN: 3.3 g/dL — AB (ref 3.5–5.0)
ALT: 44 U/L (ref 0–44)
ANION GAP: 8 (ref 5–15)
AST: 50 U/L — AB (ref 15–41)
Alkaline Phosphatase: 112 U/L (ref 38–126)
BILIRUBIN TOTAL: 0.5 mg/dL (ref 0.3–1.2)
BUN: 14 mg/dL (ref 8–23)
CO2: 29 mmol/L (ref 22–32)
Calcium: 9.8 mg/dL (ref 8.9–10.3)
Chloride: 100 mmol/L (ref 98–111)
Creatinine, Ser: 0.48 mg/dL (ref 0.44–1.00)
GFR calc Af Amer: >60 mL/min (ref >60)
GFR calc non Af Amer: >60 mL/min (ref >60)
Glucose, Bld: 115 mg/dL — ABNORMAL HIGH (ref 70–99)
POTASSIUM: 5.2 mmol/L — AB (ref 3.5–5.1)
SODIUM: 137 mmol/L (ref 135–145)
Total Protein: 6.6 g/dL (ref 6.5–8.1)

## 2018-02-08 LAB — MAGNESIUM: Magnesium: 2 mg/dL (ref 1.7–2.4)

## 2018-02-08 MED ORDER — PEG 3350-KCL-NA BICARB-NACL 420 G PO SOLR
4000.0000 mL | Freq: Once | ORAL | Status: AC
  Administered 2018-02-08: 4000 mL via ORAL
  Filled 2018-02-08: qty 4000

## 2018-02-08 NOTE — Progress Notes (Signed)
Vascular Surgery  Notes considerable nausea.Still no BM despite multiple regimens. Denies abdominal pain. Complains of LEFT heel pain.  Vitals:   02/07/18 2112 02/08/18 0453  BP: 95/64 103/72  Pulse: 71 78  Resp: 20 20  Temp: 98 F (36.7 C) (!) 97.5 F (36.4 C)  SpO2: 93% 99%   PULM Normal effort , no use of accessory muscles CV No JVD, RRR Abd No distended, nontender VASCleft foot remains warm with less than 1 second capillary refill. The bilateral calf ulcers stable and dry. Left heel ulcer- dry   POD #5 S/P Left tibial angioplasty  Will consult GI for input for constipation and nausea Limit narcotics OOB as tolerated Continue Plavix/ gabapentin Appreciate Medicine input and care

## 2018-02-08 NOTE — Progress Notes (Signed)
Fleets enema was ordered prn and administered. Patient has produced 3 golf ball sized balls of stool. Dr. Vicente Males has been consulted for GI and had ordered an abdominal xray. Dr. Vicente Males was notified of bowel movement and size. An order was given to discontinue the abdominal xray and he will be by to provide consultation today.

## 2018-02-08 NOTE — Consult Note (Signed)
Jonathon Bellows , MD [address] [place], [place], [place], [address] [address], [place], [place], [place], [address] Phone: [phone]  Fax: [phone]  Consultation  Referring Provider:  DR Delana Meyer Primary Care Physician:  Carollee Herter, [name], DO Primary Gastroenterologist:  Dr. Olena Heckle          Reason for Consultation:     Constipation  Date of Admission:  02/03/2018 Date of Consultation:  02/08/2018         HPI:   Katherine Owen is a 84 y.o. female underwent left tibial angioplasty postoperative day 5.  I have been consulted for constipation and nausea.  On Plavix and gabapentin.  Has not had a bowel movement for about 5 days.  Medications attempted as per MAR include milk of magnesia, MiraLAX the patient has been on tramadol.  Abdominal imaging includes an x-ray on the 15th -3 days back which shows no acute abnormality.  When I went into see her this afternoon she had just had a small bowel movement but was saying she felt significantly much better, the nurse in charge said it was like small golf balls . She recalls having had a colonoscopy a few years back and was normal . Denies any rectal bleeding , abdominal pain or nausea. She was having her lunch when I went in.   Past Medical History:  Diagnosis Date  . Allergy   . Arthritis   . Cataract    Bilateral  . Essential hypertension 05/02/2014  . GERD (gastroesophageal reflux disease)   . Headache   . History of shingles   . HLD (hyperlipidemia) 05/02/2014  . Hyperlipidemia   . Hypertension   . Neuropathic pain 05/02/2014  . Osteoarthritis of both shoulders due to rotator cuff injury 04/23/2016  . Poor mobility 08/28/2015  . 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 Spinal stenosis of lumbar region 05/02/2014   S/p decompression at [facility] November 2015     Past Surgical History:  Procedure Laterality Date  . ANKLE SURGERY Left 2014  . BACK SURGERY  2015   [facility]   . BUNIONECTOMY Right   .  COLONOSCOPY    . EYE SURGERY     cataract  . JOINT REPLACEMENT    . LOWER EXTREMITY ANGIOGRAPHY Left 02/03/2018   Procedure: LOWER EXTREMITY ANGIOGRAPHY;  Surgeon: Katha Cabal, MD;  Location: [facility];  Service: Cardiovascular;  Laterality: Left;  . REVERSE SHOULDER ARTHROPLASTY Right 01/11/2016   Procedure: REVERSE SHOULDER ARTHROPLASTY;  Surgeon: Justice Britain, MD;  Location: [facility];  Service: Orthopedics;  Laterality: Right;  . SHOULDER SURGERY  01/11/2016   REVERSE SHOULDER ARTHROPLASTY on 01/11/2016  . SPINE SURGERY     done at [facility] Apr 26, 2014, lumbar decompression  . TOTAL HIP ARTHROPLASTY Bilateral 2005, 2009   [place]    Prior to Admission medications   Medication Sig Start Date End Date Taking? Authorizing Provider  atorvastatin (LIPITOR) 10 MG tablet Take 1 tablet (10 mg total) by mouth at bedtime. For high cholesterol 11/19/17  Yes Roma Schanz R, DO  celecoxib (CELEBREX) 200 MG capsule Take 1 capsule (200 mg total) by mouth daily. 06/03/16  Yes Ann Held, DO  Cholecalciferol (VITAMIN D3) 5000 units CAPS Take 5,000 Units by mouth at bedtime.    Yes [provider]  furosemide (LASIX) 40 MG tablet Take 1 tablet (40 mg total) by mouth daily. 11/19/17  Yes Ann Held, DO  gabapentin (NEURONTIN) 100 MG capsule Take 1 capsule (100 mg total) by mouth 3 (three) times daily. 11/19/17 11/19/18 Yes Ann Held, DO  losartan (COZAAR) 100 MG tablet Take 1 tablet (100 mg total) by mouth daily. 11/19/17  Yes Roma Schanz R, DO  montelukast (SINGULAIR) 10 MG tablet Take 1 tablet (10 mg total) by mouth at bedtime. 11/19/17  Yes Ann Held, DO  Multiple Vitamins-Minerals (CENTRUM SILVER) tablet Take 1 tablet by mouth daily.   Yes [provider]  pantoprazole (PROTONIX) 40 MG tablet Take 1 tablet (40 mg total) by mouth daily. 11/19/17  Yes Roma Schanz R, DO  SANTYL ointment Apply 1 application  topically daily. 12/18/17  Yes [provider]  senna-docusate (SENOKOT-S) 8.6-50 MG per tablet Take 1 tablet by mouth at bedtime.    Yes [provider]  traMADol (ULTRAM) 50 MG tablet Take 1 tablet (50 mg total) by mouth 3 (three) times daily. 11/19/17  Yes Ann Held, DO  Misc. Devices [facility]) MISC Use as directed 05/02/17   Carollee Herter, [name], DO  NONFORMULARY OR COMPOUNDED ITEM Lift recliner #1  As directed  Dx spinal stenosis, osteoarthritis both shoulder ,  Low ext weakness 07/18/17   Carollee Herter, [name], DO    Family History  Problem Relation Age of Onset  . Arthritis Mother        osteoarthritis  . Colon cancer Mother   . Arthritis Father        osteoarthritis  . Parkinson's disease Father   . Anesthesia problems Neg Hx      Social History   Tobacco Use  . Smoking status: Former Smoker    Packs/day: 1.00    Years: 20.00    Pack years: 20.00    Types: Cigarettes    Last attempt to quit: 04/16/1967    Years since quitting: 50.8  . Smokeless tobacco: Never Used  . Tobacco comment: quit somking in early  1970's  Substance Use Topics  . Alcohol use: No    Alcohol/week: 0.0 standard drinks  . Drug use: No    Allergies as of 01/23/2018 - Review Complete 01/20/2018  Allergen Reaction Noted  . No known allergies  01/10/2016    Review of Systems:    All systems reviewed and negative except where noted in HPI.   Physical Exam:  Vital signs in last 24 hours: Temp:  [97.5 F (36.4 C)-98 F (36.7 C)] 97.5 F (36.4 C) (08/18 0453) Pulse Rate:  [66-78] 78 (08/18 0453) Resp:  [19-20] 20 (08/18 0453) BP: (95-110)/(60-72) 103/72 (08/18 0453) SpO2:  [93 %-99 %] 99 % (08/18 0453) Last BM Date: 02/02/18 General:   Pleasant, cooperative in NAD Head:  Normocephalic and atraumatic. Eyes:   No icterus.   Conjunctiva pink. PERRLA. Ears:  Normal auditory acuity. Neck:  Supple; no masses or thyroidomegaly Lungs: Respirations even and  unlabored. Lungs clear to auscultation bilaterally.   No wheezes, crackles, or rhonchi.  Heart:  Regular rate and rhythm;  Without murmur, clicks, rubs or gallops Abdomen:  Soft, nondistended, nontender. Normal bowel sounds. No appreciable masses or hepatomegaly.  No rebound or guarding.  Neurologic:  Alert and oriented x3;  grossly normal neurologically. Skin:  Intact without significant lesions or rashes. Cervical Nodes:  No significant cervical adenopathy. Psych:  Alert and cooperative. Normal affect.  LAB RESULTS: Recent Labs    02/06/18 0807 02/07/18 0409  WBC 8.1 7.9  HGB 10.4* 10.0*  HCT 31.2* 29.6*  PLT 228 217   BMET No results for input(s): NA, K, CL, CO2, GLUCOSE, BUN, CREATININE, CALCIUM in the last 72 hours. LFT No results for input(s): PROT, ALBUMIN, AST, ALT, ALKPHOS, BILITOT, BILIDIR, IBILI in the last 72 hours. PT/INR No results for input(s): LABPROT, INR in the last 72 hours.  STUDIES: No results found.    Impression / Plan:   Katherine Owen is a 84 y.o. y/o female postoperative day 5 left tibial angioplasty.  I have been consulted for constipation and not had a bowel movement for 5 days and failure of milk of magnesia and MiraLAX.  Plan  1.  Limit narcotics. 2.  Monitor electrolytes and correct potassium and magnesium if low.   3.  Ensure adequate hydration. 4.  Commence on GoLYTELY if patient complains of vomiting would need to be examined and abdomen check for significant distention and if noted then would require imaging and follow-up CAT scan.  If not continue to administer GoLYTELY the patient is on base to help loosen up the stool from above in addition can administer 1 or 2 soapsuds enemas to disimpact the stool distally.  Thank you for involving me in the care of this patient.      LOS: 3 days   Jonathon Bellows, MD  02/08/2018, 11:14 AM

## 2018-02-09 ENCOUNTER — Telehealth (INDEPENDENT_AMBULATORY_CARE_PROVIDER_SITE_OTHER): Payer: Self-pay | Admitting: Vascular Surgery

## 2018-02-09 ENCOUNTER — Other Ambulatory Visit (INDEPENDENT_AMBULATORY_CARE_PROVIDER_SITE_OTHER): Payer: Self-pay | Admitting: Nurse Practitioner

## 2018-02-09 DIAGNOSIS — R109 Unspecified abdominal pain: Secondary | ICD-10-CM

## 2018-02-09 MED ORDER — DEXTROSE 5 % IV SOLN
2.0000 g | Freq: Once | INTRAVENOUS | Status: DC
  Filled 2018-02-09: qty 20

## 2018-02-09 MED ORDER — CEFAZOLIN SODIUM-DEXTROSE 2-4 GM/100ML-% IV SOLN
2.0000 g | INTRAVENOUS | Status: AC
  Filled 2018-02-09: qty 100

## 2018-02-09 NOTE — Consult Note (Addendum)
[place] Nurse wound consult note requested for several locations.  This was already performed on 8/14; refer to previous progress notes for assessment and plan of care, and topical treatment orders have been provided for bedside nurses to perform: Dressing procedure/placement/frequency:Cleanse wound to left heel and left second toe with soap and water and pat dry. Paint with betadine daily. Cleanse wounds to bilateral lateral legs and sacrum with NS.  Apply Santyl to wound bed. Cover with NS moist gauze and cover with dry dressing.  Please re-consult if further assistance is needed.  Thank-you,  Julien Girt MSN, [place], [place], [place], [place]

## 2018-02-09 NOTE — Telephone Encounter (Signed)
April called the patient back to let her know that the second angio, on th right leg, would be performed tomorrow since she is already in patient at the hospital they will handle everything form there. I called scheduling to get her on the schedule for tomorrow with Dr. Delana Meyer since he had a cancellation.

## 2018-02-09 NOTE — Progress Notes (Signed)
[facility]  Daily Progress Note   Subjective  - 6 Days Post-Op  Patient reports she has had a bowel movement yesterday and a bowel movement this morning and feels much better.  She continues to complain of pain in both legs.  Objective Vitals:   02/08/18 1424 02/08/18 2025 02/09/18 0537 02/09/18 1240  BP: 108/66 106/63 110/71 117/79  Pulse: 73 67 76 86  Resp: 18 20 16 17   Temp: 98.3 F (36.8 C) 98 F (36.7 C) (!) 97.5 F (36.4 C) 98.1 F (36.7 C)  TempSrc: Oral Oral Oral Axillary  SpO2: 100% 98% 99% 99%  Weight:      Height:        Intake/Output Summary (Last 24 hours) at 02/09/2018 1802 Last data filed at 02/09/2018 1522 Gross per 24 hour  Intake 360 ml  Output 1250 ml  Net -890 ml    PULM  Normal effort , no use of accessory muscles CV  No JVD, RRR Abd      No distended, nontender VASC  left heel ulceration bilateral posterior calf ulcerations; pedal pulses nonpalpable  Laboratory CBC    Component Value Date/Time   WBC 7.9 02/07/2018 0409   HGB 10.0 (L) 02/07/2018 0409   HCT 29.6 (L) 02/07/2018 0409   PLT 217 02/07/2018 0409    BMET    Component Value Date/Time   NA 137 02/08/2018 1834   NA 142 01/24/2016 1232   K 5.2 (H) 02/08/2018 1834   CL 100 02/08/2018 1834   CO2 29 02/08/2018 1834   GLUCOSE 115 (H) 02/08/2018 1834   BUN 14 02/08/2018 1834   BUN 13 01/24/2016 1232   CREATININE 0.48 02/08/2018 1834   CALCIUM 9.8 02/08/2018 1834   GFRNONAA >60 02/08/2018 1834   GFRAA >60 02/08/2018 1834    Assessment/Planning: The patient has had successful revascularization of the left lower extremity.  She has remained in the hospital for other medical regions her constipation and abdominal symptoms have now resolved given that she is still here and it is now been approximately 1 week it is safe to proceed with right lower extremity angiography.  I will make arrangements to move forward with treatment of her right lower extremity  tomorrow.  At that time with conscious sedation I will also debride her left heel as needed.  The risks and benefits were reviewed with the patient she is in agreement and is quite happy to get it all done now before going home and having to return to the hospital.    Katherine Owen  02/09/2018, 6:02 PM

## 2018-02-09 NOTE — Progress Notes (Signed)
   Jonathon Bellows , MD [address], [place], [place], [place], [address] [address], [place], [place], [place], [address] Phone: [phone]  Fax: [phone]   Katherine Owen is being followed for constipation  Day 1 of follow up   Subjective: Had two big bowel movements , feels "much much much better"   Objective: Vital signs in last 24 hours: Vitals:   02/08/18 0453 02/08/18 1424 02/08/18 2025 02/09/18 0537  BP: 103/72 108/66 106/63 110/71  Pulse: 78 73 67 76  Resp: 20 18 20 16   Temp: (!) 97.5 F (36.4 C) 98.3 F (36.8 C) 98 F (36.7 C) (!) 97.5 F (36.4 C)  TempSrc: Oral Oral Oral Oral  SpO2: 99% 100% 98% 99%  Weight:      Height:       Weight change:   Intake/Output Summary (Last 24 hours) at 02/09/2018 1049 Last data filed at 02/09/2018 0900 Gross per 24 hour  Intake 120 ml  Output 900 ml  Net -780 ml     Exam: Heart:: Regular rate and rhythm, S1S2 present or without murmur or extra heart sounds Lungs: normal, clear to auscultation and clear to auscultation and percussion Abdomen: soft, nontender, normal bowel sounds   Lab Results: @LABTEST2 @ Micro Results: No results found for this or any previous visit (from the past 240 hour(s)). Studies/Results: No results found. Medications: I have reviewed the patient's current medications. Scheduled Meds: . atorvastatin  10 mg Oral q1800  . cholecalciferol  5,000 Units Oral QHS  . clopidogrel  75 mg Oral Q breakfast  . collagenase  1 application Topical BID  . docusate sodium  100 mg Oral BID  . gabapentin  100 mg Oral TID  . montelukast  10 mg Oral QHS  . multivitamin with minerals   Oral Daily  . pantoprazole  40 mg Oral Daily  . senna-docusate  1 tablet Oral QHS  . sodium chloride flush  3 mL Intravenous Q12H  . traMADol  50 mg Oral TID   Continuous Infusions: . 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 y/o female postoperative day 5 left tibial angioplasty.  I have been consulted for constipation and not had a bowel movement for 5 days and failure of milk of magnesia and MiraLAX.  Good response to golytely. Suggest continue daily miralax while she is on opiods.   I will sign off.  Please call me if any further GI concerns or questions.  We would like to thank you for the opportunity to participate in the care of Katherine Owen.    LOS: 4 days   Jonathon Bellows, MD 02/09/2018, 10:49 AM

## 2018-02-09 NOTE — Care Management Important Message (Signed)
Copy of signed IM left with patient in room.  

## 2018-02-10 ENCOUNTER — Inpatient Hospital Stay: Payer: Medicare Other

## 2018-02-10 ENCOUNTER — Encounter: Payer: Self-pay | Admitting: Vascular Surgery

## 2018-02-10 ENCOUNTER — Encounter
Admission: RE | Disposition: A | Discharge status: Skilled Nursing Facility | Payer: Self-pay | Source: Home / Self Care | Attending: Vascular Surgery

## 2018-02-10 DIAGNOSIS — I70211 Atherosclerosis of native arteries of extremities with intermittent claudication, right leg: Secondary | ICD-10-CM

## 2018-02-10 HISTORY — PX: LOWER EXTREMITY ANGIOGRAPHY: CATH118251

## 2018-02-10 LAB — SURGICAL PCR SCREEN
MRSA, PCR: NEGATIVE
Staphylococcus aureus: NEGATIVE

## 2018-02-10 SURGERY — PERIPHERAL VASCULAR BALLOON ANGIOPLASTY
Anesthesia: Moderate Sedation | Laterality: Right

## 2018-02-10 SURGERY — LOWER EXTREMITY ANGIOGRAPHY
Anesthesia: Moderate Sedation | Site: Leg Lower | Laterality: Right

## 2018-02-10 MED ORDER — HYDROMORPHONE HCL 1 MG/ML IJ SOLN
1.0000 mg | Freq: Once | INTRAMUSCULAR | Status: DC
  Filled 2018-02-10: qty 1

## 2018-02-10 MED ORDER — FENTANYL CITRATE (PF) 100 MCG/2ML IJ SOLN
INTRAMUSCULAR | Status: DC | PRN
  Administered 2018-02-10: 50 ug via INTRAVENOUS

## 2018-02-10 MED ORDER — ONDANSETRON HCL 4 MG/2ML IJ SOLN: 4.0000 mg | Freq: Four times a day (QID) | INTRAMUSCULAR | Status: DC | PRN

## 2018-02-10 MED ORDER — MIDAZOLAM HCL 2 MG/2ML IJ SOLN
INTRAMUSCULAR | Status: DC | PRN
  Administered 2018-02-10: 2 mg via INTRAVENOUS

## 2018-02-10 MED ORDER — FENTANYL CITRATE (PF) 100 MCG/2ML IJ SOLN
INTRAMUSCULAR | Status: AC
  Filled 2018-02-10: qty 2

## 2018-02-10 MED ORDER — SODIUM CHLORIDE 0.9 % IV SOLN
INTRAVENOUS | Status: AC | PRN
  Administered 2018-02-10: 500 mL via INTRAVENOUS

## 2018-02-10 MED ORDER — LIDOCAINE HCL (PF) 1 % IJ SOLN
INTRAMUSCULAR | Status: AC
  Filled 2018-02-10: qty 30

## 2018-02-10 MED ORDER — IOPAMIDOL (ISOVUE-300) INJECTION 61%
INTRAVENOUS | Status: DC | PRN
  Administered 2018-02-10: 50 mL via INTRA_ARTERIAL

## 2018-02-10 MED ORDER — MIDAZOLAM HCL 5 MG/5ML IJ SOLN
INTRAMUSCULAR | Status: AC
  Filled 2018-02-10: qty 5

## 2018-02-10 MED ORDER — FAMOTIDINE 20 MG PO TABS: 40.0000 mg | ORAL_TABLET | ORAL | Status: DC | PRN

## 2018-02-10 MED ORDER — ATORVASTATIN CALCIUM 10 MG PO TABS: 10.0000 mg | ORAL_TABLET | Freq: Every day | ORAL | Status: DC

## 2018-02-10 MED ORDER — SODIUM CHLORIDE 0.9% FLUSH
3.0000 mL | Freq: Two times a day (BID) | INTRAVENOUS | Status: DC
  Administered 2018-02-10 – 2018-02-12 (×5): 3 mL via INTRAVENOUS

## 2018-02-10 MED ORDER — SODIUM CHLORIDE 0.9 % IV SOLN: INTRAVENOUS | Status: AC

## 2018-02-10 MED ORDER — HEPARIN SODIUM (PORCINE) 1000 UNIT/ML IJ SOLN
INTRAMUSCULAR | Status: AC
  Filled 2018-02-10: qty 1

## 2018-02-10 MED ORDER — SODIUM CHLORIDE 0.9 % IV SOLN: 250.0000 mL | INTRAVENOUS | Status: DC | PRN

## 2018-02-10 MED ORDER — HEPARIN (PORCINE) IN NACL 1000-0.9 UT/500ML-% IV SOLN
INTRAVENOUS | Status: AC
  Filled 2018-02-10: qty 1000

## 2018-02-10 MED ORDER — SODIUM CHLORIDE 0.9% FLUSH: 3.0000 mL | INTRAVENOUS | Status: DC | PRN

## 2018-02-10 MED ORDER — HEPARIN SODIUM (PORCINE) 1000 UNIT/ML IJ SOLN
INTRAMUSCULAR | Status: DC | PRN
  Administered 2018-02-10: 5000 [IU] via INTRAVENOUS

## 2018-02-10 MED ORDER — SODIUM CHLORIDE 0.9 % IV SOLN
INTRAVENOUS | Status: DC
  Administered 2018-02-10: 09:00:00 via INTRAVENOUS

## 2018-02-10 MED ORDER — METHYLPREDNISOLONE SODIUM SUCC 125 MG IJ SOLR: 125.0000 mg | INTRAMUSCULAR | Status: DC | PRN

## 2018-02-10 SURGICAL SUPPLY — 23 items
BALLN LUTONIX 5X220X130 (BALLOONS) ×3
BALLN ULTRASCORE 5X40X130 (BALLOONS) ×3
BALLN ULTRASCORE 7X40X130 (BALLOONS) ×3
BALLOON LUTONIX 5X220X130 (BALLOONS) ×1 IMPLANT
BALLOON ULTRASCORE 5X40X130 (BALLOONS) ×1 IMPLANT
BALLOON ULTRASCORE 7X40X130 (BALLOONS) ×1 IMPLANT
CATH BEACON 5 .035 65 RIM TIP (CATHETERS) ×3 IMPLANT
CATH VERT 5FR 125CM (CATHETERS) ×3 IMPLANT
COVER PROBE U/S 5X48 (MISCELLANEOUS) ×3 IMPLANT
DEVICE PRESTO INFLATION (MISCELLANEOUS) ×3 IMPLANT
DEVICE STARCLOSE SE CLOSURE (Vascular Products) ×3 IMPLANT
DEVICE TORQUE .025-.038 (MISCELLANEOUS) ×3 IMPLANT
NEEDLE ENTRY 21GA 7CM ECHOTIP (NEEDLE) ×3 IMPLANT
PACK ANGIOGRAPHY (CUSTOM PROCEDURE TRAY) ×3 IMPLANT
SET INTRO CAPELLA COAXIAL (SET/KITS/TRAYS/PACK) ×3 IMPLANT
SHEATH ANL2 6FRX45 HC (SHEATH) ×3 IMPLANT
SHEATH BRITE TIP 5FRX11 (SHEATH) ×3 IMPLANT
SHIELD RADPAD SCOOP 12X17 (MISCELLANEOUS) ×3 IMPLANT
TOWEL OR 17X26 4PK STRL BLUE (TOWEL DISPOSABLE) ×3 IMPLANT
TUBING CONTRAST HIGH PRESS 72 (TUBING) ×3 IMPLANT
WIRE AQUATRACK .035X260CM (WIRE) ×3 IMPLANT
WIRE J 3MM .035X145CM (WIRE) ×3 IMPLANT
WIRE MAGIC TORQUE 260C (WIRE) ×3 IMPLANT

## 2018-02-10 NOTE — Op Note (Signed)
[facility] Percutaneous Study/Intervention Procedural Note   Date of Surgery: 02/10/2018  Surgeon:  Katha Cabal, MD.  Pre-operative Diagnosis: Atherosclerotic occlusive disease bilateral lower extremities with bilateral ulceration  Post-operative diagnosis: Same  Procedure(s) Performed: 1. Introduction catheter into right lower extremity 3rd order catheter placement  2. Contrast injection right lower extremity for distal runoff   3. Percutaneous transluminal angioplasty right common and superficial femoral artery to 7 mm and 5 mm respectively               4. Star close closure left common femoral arteriotomy  Anesthesia: Conscious sedation was administered under my direct supervision by the interventional radiology RN. IV Versed plus fentanyl were utilized. Continuous ECG, pulse oximetry and blood pressure was monitored throughout the entire procedure.  Conscious sedation was for a total of 41 minutes.  Sheath: 6 [place] Ansell retrograde left common femoral  Contrast: 50 cc  Fluoroscopy Time: 7.2 minutes  Indications: Meryem Haertel presents with open ulceration of both lower extremities.  She is status post successful revascularization on the left and now is undergoing revascularization on the right.  The risks and benefits are reviewed all questions answered patient agrees to proceed.  Procedure: Katherine Owen is a 84 y.o. y.o. female who was identified and appropriate procedural time out was performed. The patient was then placed supine on the table and prepped and draped in the usual sterile fashion.   Ultrasound was placed in the sterile sleeve and the left groin was evaluated the left common femoral artery was echolucent and pulsatile indicating patency.  Image was recorded for the permanent record and under real-time visualization a microneedle was inserted into the common femoral artery  microwire followed by a micro-sheath.  A J-wire was then advanced through the micro-sheath and a  5 [place] sheath was then inserted over a J-wire. J-wire was then advanced and a 5 French rim catheter was positioned at the level of the distal aorta. AP projection of the distal aorta and iliac bifurcation was then obtained.  Rim catheter was used to perform a LAO view of the pelvis was obtained.  Subsequently a rim catheter with the stiff angle Glidewire was used to cross the aortic bifurcation the catheter wire were advanced down into the right distal external iliac artery. Oblique view of the femoral bifurcation was then obtained and subsequently the wire was reintroduced and the pigtail catheter negotiated into the SFA representing third order catheter placement. Distal runoff was then performed.  Distal aorta and the iliac system is noted to be patent.  Within the common femoral artery there is a greater than 80% shelflike lesion.  The ostia of the profunda femoris and SFA are patent.  There is a greater than 80% mid SFA lesion and subsequently fairly extensive diffuse disease down through Hunter's canal into the above-knee popliteal where there are areas of greater than 70% stenosis.  The trifurcation is heavily diseased with patency of the peroneal only.  The peroneal itself does not have any hemodynamically significant lesions and at the ankle collateralizes to both the lateral plantar as well as the dorsalis pedis.  5000 units of heparin was then given and allowed to circulate and a 6 [place] Ansell sheath was advanced up and over the bifurcation and positioned in the femoral artery  KMP  catheter and aqua track wire were then negotiated down into the SFA.  Then the aqua track wire was exchanged for a Magic torque wire.  A  7 mm x 40 mm ultra score balloon was then advanced across the common femoral lesion.  Inflation was to 10 atm for 1 minute.  Follow-up imaging demonstrated less than 20% residual  stenosis with no evidence of dissection.  The KMP catheter was then reintroduced.  Distal runoff was then completed by hand injection through the catheter.  This demonstrated the above-noted SFA and popliteal stenoses the wire was then reintroduced and a 5 mm x 40 mm ultra score balloon was used to angioplasty the superficial femoral and popliteal arteries.  2 inflations were to 8 atmospheres for 1 minutes. Follow-up imaging demonstrated patency with less than 10% residual stenosis.  A 5 mm x 22 cm Lutonix drug-eluting balloon was then advanced across these lesions inflation was to 10 atm for 2 full minutes.  Follow-up imaging demonstrated less than 5% residual stenosis throughout the femoral-popliteal system.  Distal runoff was then reassessed and found to be unchanged with patent single-vessel runoff via the peroneal.  After review of these images the sheath is pulled into the left external iliac oblique of the common femoral is obtained and a Star close device deployed. There no immediate complications.   Findings:  The right common femoral is noted to have a greater than 80% focal lesion this appears shelflike and is treated with a 7 mm balloon inflation using ultrasound or balloon with excellent result less than 20% residual stenosis.  The profunda femoris is patent as is the origin of the SFA.  The SFA does indeed have a significant stenosis in its midportion and then diffuse greater than 70% stenosis down through Hunter's canal extending into the above-knee popliteal.  The distal popliteal demonstrates nonhemodynamically significant disease and the trifurcation is heavily diseased with occlusion of the anterior tibial and posterior tibial.  The peroneal is patent throughout its course  Following angioplasty of the common femoral and the SFA, there is now patent in-line flow to the foot and looks quite nice.     Summary: Successful recanalization right lower extremity for limb  salvage    Disposition: Patient was taken to the recovery room in stable condition having tolerated the procedure well.  Schnier, Dolores Lory 02/10/2018,11:25 AM

## 2018-02-10 NOTE — Discharge Instructions (Signed)
Allergies An allergy is when your body reacts to a substance in a way that is not normal. An allergic reaction can happen after you:  Eat something.  Breathe in something.  Touch something.  You can be allergic to:  Things that are only around during certain seasons, like molds and pollens.  Foods.  Drugs.  Insects.  Animal dander.  What are the signs or symptoms?  Puffiness (swelling). This may happen on the lips, face, tongue, mouth, or throat.  Sneezing.  Coughing.  Breathing loudly (wheezing).  Stuffy nose.  Tingling in the mouth.  A rash.  Itching.  Itchy, red, puffy areas of skin (hives).  Watery eyes.  Throwing up (vomiting).  Watery poop (diarrhea).  Dizziness.  Feeling faint or fainting.  Trouble breathing or swallowing.  A tight feeling in the chest.  A fast heartbeat. How is this diagnosed? Allergies can be diagnosed with:  A medical and family history.  Skin tests.  Blood tests.  A food diary. A food diary is a record of all the foods, drinks, and symptoms you have each day.  The results of an elimination diet. This diet involves making sure not to eat certain foods and then seeing what happens when you start eating them again.  How is this treated? There is no cure for allergies, but allergic reactions can be treated with medicine. Severe reactions usually need to be treated at a hospital. How is this prevented? The best way to prevent an allergic reaction is to avoid the thing you are allergic to. Allergy shots and medicines can also help prevent reactions in some cases. This information is not intended to replace advice given to you by your health care provider. Make sure you discuss any questions you have with your health care provider. Document Released: 10/05/2012 Document Revised: 02/05/2016 Document Reviewed: 03/22/2014 Elsevier Interactive Patient Education  2018 [employer]. Groin Insertion Instructions-If you  lose feeling or develop tingling or pain in your leg or foot after the procedure, please walk around first.  If the discomfort does not improve , contact your physician and proceed to the nearest emergency room.  Loss of feeling in your leg might mean that a blockage has formed in the artery and this can be appropriately treated.  Limit your activity for the next two days after your procedure.  Avoid stooping, bending, heavy lifting or exertion as this may put pressure on the insertion site.  Resume normal activities in 48 hours.  You may shower after 24 hours but avoid excessive warm water and do not scrub the site.  Remove clear dressing in 48 hours.  If you have had a closure device inserted, do not soak in a tub bath or a hot tub for at least one week.  No driving for 48 hours after discharge.  After the procedure, check the insertion site occasionally.  If any oozing occurs or there is apparent swelling, firm pressure over the site will prevent a bruise from forming.  You can not hurt anything by pressing directly on the site.  The pressure stops the bleeding by allowing a small clot to form.  If the bleeding continues after the pressure has been applied for more than 15 minutes, call 911 or go to the nearest emergency room.    The x-ray dye causes you to pass a considerate amount of urine.  For this reason, you will be asked to drink plenty of liquids after the procedure to prevent dehydration.  You may resume you regular diet.  Avoid caffeine products.    For pain at the site of your procedure, take non-aspirin medicines such as Tylenol.  Medications: A. Hold Metformin for 48 hours if applicable.  B. Continue taking all your present medications at home unless your doctor prescribes any changes.Angiogram, Care After This sheet gives you information about how to care for yourself after your procedure. Your health care provider may also give you more specific instructions. If you have problems or  questions, contact your health care provider. What can I expect after the procedure? After the procedure, it is common to have bruising and tenderness at the catheter insertion area. Follow these instructions at home: Insertion site care  Follow instructions from your health care provider about how to take care of your insertion site. Make sure you: ? Wash your hands with soap and water before you change your bandage (dressing). If soap and water are not available, use hand sanitizer. ? Change your dressing as told by your health care provider. ? Leave stitches (sutures), skin glue, or adhesive strips in place. These skin closures may need to stay in place for 2 weeks or longer. If adhesive strip edges start to loosen and curl up, you may trim the loose edges. Do not remove adhesive strips completely unless your health care provider tells you to do that.  Do not take baths, swim, or use a hot tub until your health care provider approves.  You may shower 24-48 hours after the procedure or as told by your health care provider. ? Gently wash the site with plain soap and water. ? Pat the area dry with a clean towel. ? Do not rub the site. This may cause bleeding.  Do not apply powder or lotion to the site. Keep the site clean and dry.  Check your insertion site every day for signs of infection. Check for: ? Redness, swelling, or pain. ? Fluid or blood. ? Warmth. ? Pus or a bad smell. Activity  Rest as told by your health care provider, usually for 1-2 days.  Do not lift anything that is heavier than 10 lbs. (4.5 kg) or as told by your health care provider.  Do not drive for 24 hours if you were given a medicine to help you relax (sedative).  Do not drive or use heavy machinery while taking prescription pain medicine. General instructions  Return to your normal activities as told by your health care provider, usually in about a week. Ask your health care provider what activities are  safe for you.  If the catheter site starts bleeding, lie flat and put pressure on the site. If the bleeding does not stop, get help right away. This is a medical emergency.  Drink enough fluid to keep your urine clear or pale yellow. This helps flush the contrast dye from your body.  Take over-the-counter and prescription medicines only as told by your health care provider.  Keep all follow-up visits as told by your health care provider. This is important. Contact a health care provider if:  You have a fever or chills.  You have redness, swelling, or pain around your insertion site.  You have fluid or blood coming from your insertion site.  The insertion site feels warm to the touch.  You have pus or a bad smell coming from your insertion site.  You have bruising around the insertion site.  You notice blood collecting in the tissue around the catheter site (hematoma).  The hematoma may be painful to the touch. Get help right away if:  You have severe pain at the catheter insertion area.  The catheter insertion area swells very fast.  The catheter insertion area is bleeding, and the bleeding does not stop when you hold steady pressure on the area.  The area near or just beyond the catheter insertion site becomes pale, cool, tingly, or numb. These symptoms may represent a serious problem that is an emergency. Do not wait to see if the symptoms will go away. Get medical help right away. Call your local emergency services (911 in the [place] Do not drive yourself to the hospital. Summary  After the procedure, it is common to have bruising and tenderness at the catheter insertion area.  After the procedure, it is important to rest and drink plenty of fluids.  Do not take baths, swim, or use a hot tub until your health care provider says it is okay to do so. You may shower 24-48 hours after the procedure or as told by your health care provider.  If the catheter site starts bleeding,  lie flat and put pressure on the site. If the bleeding does not stop, get help right away. This is a medical emergency. This information is not intended to replace advice given to you by your health care provider. Make sure you discuss any questions you have with your health care provider. Document Released: 12/27/2004 Document Revised: 05/15/2016 Document Reviewed: 05/15/2016 Elsevier Interactive Patient Education  [employer].

## 2018-02-10 NOTE — Progress Notes (Signed)
Patient off the unit to MRI - transported by orderly

## 2018-02-11 DIAGNOSIS — I70213 Atherosclerosis of native arteries of extremities with intermittent claudication, bilateral legs: Secondary | ICD-10-CM

## 2018-02-11 NOTE — NC FL2 (Signed)
[place] LEVEL OF CARE SCREENING TOOL     IDENTIFICATION  Patient Name: Katherine Owen Birthdate: 11-11-1927 Sex: female Admission Date (Current Location): 02/03/2018  [facility] and [place] Number:  Engineering geologist and Address:  [facility], [address], [place], [place] [address]      Provider Number: [id number]  Attending Physician Name and Address:  Katha Cabal, MD  Relative Name and Phone Number:       Current Level of Care: Hospital Recommended Level of Care: [place] Prior Approval Number:    Date Approved/Denied:   PASRR Number:    Discharge Plan: SNF    Current Diagnoses: Patient Active Problem List   Diagnosis Date Noted  . PAD (peripheral artery disease) ([place]) 02/05/2018  . Skin ulcer of sacrum with necrosis of muscle ([place])   . Atherosclerosis of artery of extremity with ulceration ([place]) 02/03/2018  . Lymphedema 01/20/2018  . Chronic venous insufficiency 01/20/2018  . Declining functional status 12/24/2017  . Open wound of left lower leg 11/24/2017  . Open toe wound, initial encounter 11/24/2017  . Lower extremity edema 07/18/2017  . Pain of left heel 04/08/2017  . Dyspepsia 03/18/2017  . Left shoulder pain 07/02/2016  . Cervical disc disorder with radiculopathy of cervical region 07/02/2016  . Osteoarthritis of both shoulders due to rotator cuff injury 04/23/2016  . 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 Poor mobility 08/28/2015  . Physical exam 10/27/2014  . Leg wound, left 06/29/2014  . Leg ulcer ([place]) 05/02/2014  . HLD (hyperlipidemia) 05/02/2014  . CN (constipation) 05/02/2014  . Spinal stenosis of lumbar region 05/02/2014  . Rhinitis, allergic 05/02/2014  . Neuropathic pain 05/02/2014  . Essential hypertension 05/02/2014    Orientation RESPIRATION BLADDER Height & Weight     Self, Place, Situation  Normal Incontinent Weight: 175 lb 0.7 oz (79.4 kg) Height:   5\' 4"  (162.6 cm)  BEHAVIORAL SYMPTOMS/MOOD NEUROLOGICAL BOWEL NUTRITION STATUS  (none) (none) Continent Diet(Heart Healthy)  AMBULATORY STATUS COMMUNICATION OF NEEDS Skin   Total Care Verbally PU Stage and Appropriate Care                       Personal Care Assistance Level of Assistance  Total care       Total Care Assistance: Maximum assistance   Functional Limitations Info             SPECIAL CARE FACTORS FREQUENCY                       Contractures Contractures Info: Not present    Additional Factors Info  Code Status Code Status Info: full             Current Medications (02/11/2018):  This is the current hospital active medication list Current Facility-Administered Medications  Medication Dose Route Frequency Provider Last Rate Last Dose  . 0.9 %  sodium chloride infusion  250 mL Intravenous PRN Schnier, Dolores Lory, MD      . 0.9 %  sodium chloride infusion  250 mL Intravenous PRN Schnier, Dolores Lory, MD      . acetaminophen (TYLENOL) tablet 650 mg  650 mg Oral Q4H PRN Schnier, Dolores Lory, MD   650 mg at 02/10/18 0328  . atorvastatin (LIPITOR) tablet 10 mg  10 mg Oral q1800 Schnier, Dolores Lory, MD   10 mg at 02/10/18 1807  . cholecalciferol (VITAMIN D) tablet 5,000 Units  5,000 Units Oral QHS Schnier, Dolores Lory, MD   5,000 Units at 02/10/18 2155  . clopidogrel (PLAVIX) tablet 75 mg  75 mg Oral Q breakfast Schnier, Dolores Lory, MD   75 mg at 02/11/18 0854  . collagenase (SANTYL) ointment 1 application  1 application Topical BID Schnier, Dolores Lory, MD   1 application at 35/57/32 1138  . docusate sodium (COLACE) capsule 100 mg  100 mg Oral BID Delana Meyer Dolores Lory, MD   100 mg at 02/11/18 0854  . gabapentin (NEURONTIN) capsule 100 mg  100 mg Oral TID Katha Cabal, MD   100 mg at 02/11/18 0854  . HYDROmorphone (DILAUDID) injection 1 mg  1 mg Intravenous Once Schnier, Dolores Lory, MD      . magnesium hydroxide (MILK OF MAGNESIA) suspension 15 mL  15 mL Oral  Daily PRN Schnier, Dolores Lory, MD   15 mL at 02/07/18 0925  . montelukast (SINGULAIR) tablet 10 mg  10 mg Oral QHS Schnier, Dolores Lory, MD   10 mg at 02/10/18 2155  . morphine 4 MG/ML injection 2 mg  2 mg Intravenous Q1H PRN Schnier, Dolores Lory, MD   2 mg at 02/11/18 0858  . multivitamin with minerals tablet   Oral Daily Schnier, Dolores Lory, MD   1 tablet at 02/11/18 0855  . ondansetron (ZOFRAN) injection 4 mg  4 mg Intravenous Q6H PRN Schnier, Dolores Lory, MD   4 mg at 02/07/18 1114  . ondansetron (ZOFRAN) injection 4 mg  4 mg Intravenous Q6H PRN Schnier, Dolores Lory, MD      . pantoprazole (PROTONIX) EC tablet 40 mg  40 mg Oral Daily Schnier, Dolores Lory, MD   40 mg at 02/11/18 0854  . senna-docusate (Senokot-S) tablet 1 tablet  1 tablet Oral QHS Schnier, Dolores Lory, MD   1 tablet at 02/10/18 2155  . sodium chloride flush (NS) 0.9 % injection 10-40 mL  10-40 mL Intracatheter PRN Schnier, Dolores Lory, MD   20 mL at 02/08/18 2153  . sodium chloride flush (NS) 0.9 % injection 3 mL  3 mL Intravenous Q12H Schnier, Dolores Lory, MD   3 mL at 02/11/18 0900  . sodium chloride flush (NS) 0.9 % injection 3 mL  3 mL Intravenous PRN Schnier, Dolores Lory, MD      . sodium chloride flush (NS) 0.9 % injection 3 mL  3 mL Intravenous Q12H Schnier, Dolores Lory, MD   3 mL at 02/11/18 0859  . sodium chloride flush (NS) 0.9 % injection 3 mL  3 mL Intravenous PRN Schnier, Dolores Lory, MD      . sodium phosphate (FLEET) 7-19 GM/118ML enema 1 enema  1 enema Rectal Daily PRN Schnier, Dolores Lory, MD   1 enema at 02/08/18 1056  . sorbitol 70 % solution 30 mL  30 mL Oral Daily PRN Schnier, Dolores Lory, MD      . traMADol Veatrice Bourbon) tablet 50 mg  50 mg Oral TID Delana Meyer Dolores Lory, MD   50 mg at 02/11/18 1138     Discharge Medications: Please see discharge summary for a list of discharge medications.  Relevant Imaging Results:  Relevant Lab Results:   Additional Information ss: [id number]  Shela Leff, LCSW

## 2018-02-11 NOTE — Consult Note (Signed)
ORTHOPAEDIC CONSULTATION  REQUESTING PHYSICIAN: Schnier, Dolores Lory, MD  Chief Complaint: left heel ulcer  HPI: Katherine Owen is a 84 y.o. female who complains of long-standing left heel ulceration.  Asked to evaluate as concerned that the ulcer was quite deep and would need deep debridement.  Patient has been seen at wound care center and has home health performing dressing changes.  Patient has undergone lower extremity revascularization.  MRI was performed recently.  Past Medical History:  Diagnosis Date  . Allergy   . Arthritis   . Cataract    Bilateral  . Essential hypertension 05/02/2014  . GERD (gastroesophageal reflux disease)   . Headache   . History of shingles   . HLD (hyperlipidemia) 05/02/2014  . Hyperlipidemia   . Hypertension   . Neuropathic pain 05/02/2014  . Osteoarthritis of both shoulders due to rotator cuff injury 04/23/2016  . Poor mobility 08/28/2015  . 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 Spinal stenosis of lumbar region 05/02/2014   S/p decompression at [facility] November 2015    Past Surgical History:  Procedure Laterality Date  . ANKLE SURGERY Left 2014  . BACK SURGERY  2015   [facility]   . BUNIONECTOMY Right   . COLONOSCOPY    . EYE SURGERY     cataract  . JOINT REPLACEMENT    . LOWER EXTREMITY ANGIOGRAPHY Left 02/03/2018   Procedure: LOWER EXTREMITY ANGIOGRAPHY;  Surgeon: Katha Cabal, MD;  Location: [place] [facility];  Service: Cardiovascular;  Laterality: Left;  . LOWER EXTREMITY ANGIOGRAPHY Right 02/10/2018   Procedure: LOWER EXTREMITY ANGIOGRAPHY;  Surgeon: Katha Cabal, MD;  Location: [place] [facility];  Service: Cardiovascular;  Laterality: Right;  . REVERSE SHOULDER ARTHROPLASTY Right 01/11/2016   Procedure: REVERSE SHOULDER ARTHROPLASTY;  Surgeon: Justice Britain, MD;  Location: [place];  Service: Orthopedics;  Laterality: Right;  . SHOULDER SURGERY  01/11/2016   REVERSE SHOULDER ARTHROPLASTY on 01/11/2016  . SPINE SURGERY      done at [facility] Apr 26, 2014, lumbar decompression  . TOTAL HIP ARTHROPLASTY Bilateral 2005, 2009   [place] , [place]   Social History   Socioeconomic History  . Marital status: Widowed    Spouse name: Not on file  . Number of children: 3  . Years of education: Not on file  . Highest education level: Not on file  Occupational History  . Occupation: Microbiologist  Social Needs  . Financial resource strain: Not on file  . Food insecurity:    Worry: Not on file    Inability: Not on file  . Transportation needs:    Medical: Not on file    Non-medical: Not on file  Tobacco Use  . Smoking status: Former Smoker    Packs/day: 1.00    Years: 20.00    Pack years: 20.00    Types: Cigarettes    Last attempt to quit: 04/16/1967    Years since quitting: 50.8  . Smokeless tobacco: Never Used  . Tobacco comment: quit somking in early  1970's  Substance and Sexual Activity  . Alcohol use: No    Alcohol/week: 0.0 standard drinks  . Drug use: No  . Sexual activity: Not Currently  Lifestyle  . Physical activity:    Days per week: Not on file    Minutes per session: Not on file  . Stress: Not on file  Relationships  . Social connections:    Talks on phone: Not on file    Gets  together: Not on file    Attends religious service: Not on file    Active member of club or organization: Not on file    Attends meetings of clubs or organizations: Not on file    Relationship status: Not on file  Other Topics Concern  . Not on file  Social History Narrative  . Not on file   Family History  Problem Relation Age of Onset  . Arthritis Mother        osteoarthritis  . Colon cancer Mother   . Arthritis Father        osteoarthritis  . Parkinson's disease Father   . Anesthesia problems Neg Hx    No Known Allergies Prior to Admission medications   Medication Sig Start Date End Date Taking? Authorizing Provider  atorvastatin (LIPITOR) 10 MG tablet Take 1 tablet (10 mg  total) by mouth at bedtime. For high cholesterol 11/19/17  Yes Roma Schanz R, DO  celecoxib (CELEBREX) 200 MG capsule Take 1 capsule (200 mg total) by mouth daily. 06/03/16  Yes Ann Held, DO  Cholecalciferol (VITAMIN D3) 5000 units CAPS Take 5,000 Units by mouth at bedtime.    Yes [provider]  furosemide (LASIX) 40 MG tablet Take 1 tablet (40 mg total) by mouth daily. 11/19/17  Yes Roma Schanz R, DO  gabapentin (NEURONTIN) 100 MG capsule Take 1 capsule (100 mg total) by mouth 3 (three) times daily. 11/19/17 11/19/18 Yes Ann Held, DO  losartan (COZAAR) 100 MG tablet Take 1 tablet (100 mg total) by mouth daily. 11/19/17  Yes Roma Schanz R, DO  montelukast (SINGULAIR) 10 MG tablet Take 1 tablet (10 mg total) by mouth at bedtime. 11/19/17  Yes Ann Held, DO  Multiple Vitamins-Minerals (CENTRUM SILVER) tablet Take 1 tablet by mouth daily.   Yes [provider]  pantoprazole (PROTONIX) 40 MG tablet Take 1 tablet (40 mg total) by mouth daily. 11/19/17  Yes Roma Schanz R, DO  SANTYL ointment Apply 1 application topically daily. 12/18/17  Yes [provider]  senna-docusate (SENOKOT-S) 8.6-50 MG per tablet Take 1 tablet by mouth at bedtime.    Yes [provider]  traMADol (ULTRAM) 50 MG tablet Take 1 tablet (50 mg total) by mouth 3 (three) times daily. 11/19/17  Yes Ann Held, DO  Misc. Devices [facility]) MISC Use as directed 05/02/17   Carollee Herter, [name], DO  NONFORMULARY OR COMPOUNDED ITEM Lift recliner #1  As directed  Dx spinal stenosis, osteoarthritis both shoulder ,  Low ext weakness 07/18/17   Ann Held, DO   Mr Heel Left Wo Contrast  Result Date: 02/10/2018 CLINICAL DATA:  84 year old female with ulceration of the left ankle and toe which has been present for multiple weeks and not improving. There has been some drainage. No known trauma. EXAM: MR OF THE LEFT HEEL WITHOUT  CONTRAST TECHNIQUE: Multiplanar, multisequence MR imaging of the left heel was performed. No intravenous contrast was administered. COMPARISON:  None. FINDINGS: Bones/Joint/Cartilage No bone destruction or fracture. No findings of acute osteomyelitis. Susceptibility artifacts from fixation hardware across the distal tibia and fibula limit assessment of the distal tibia and fibula. The tibiotalar, subtalar and midfoot articulations are maintained in congruent with minimal degenerative spurring off the dorsum of the midfoot the level of the talonavicular joint. Small calcaneal enthesophytes are present. Ligaments Noncontributory but largely intact. Muscles and Tendons No evidence of pyomyositis. The peroneal brevis  and longus, posteromedial and extensor tendons are of normal signal intensity morphology. Intact Achilles tendon. No tenosynovitis. Soft tissues Subcutaneous soft tissue induration of the heel overlying the posterior plantar aspect of the calcaneus. No drainable fluid collections. Findings likely represent stigmata of soft tissue cellulitis and inflammatory change. Intact plantar fascia. IMPRESSION: Soft tissue inflammation/cellulitis of the heel without evidence of acute osteomyelitis. Electronically Signed   By: Ashley Royalty M.D.   On: 02/10/2018 23:34    Positive ROS: All other systems have been reviewed and were otherwise negative with the exception of those mentioned in the HPI and as above.  12 point ROS was performed.  Physical Exam: General: Alert and oriented.  No apparent distress.  Vascular:  Left foot:Dorsalis Pedis:  absent Posterior Tibial:  absent  Right foot: Dorsalis Pedis:  absent Posterior Tibial:  absent  Neuro:absent  Derm: On the posterior aspect of the left heel upon initial evaluation there was a very large necrotic eschar.  I was able to gently remove this and apprising that there was a fair amount of healthy skin beneath the area.  There was a central ulceration that  measured 3 x 2 cm with good granular proliferative tissue.  No signs of severe necrosis and no probing deep to the area.  No signs of infection.  A secondary ulcer on the lateral aspect of the left lower leg is noted.  This measures 3 x 1.5 cm.  Good healthy granular tissue base.  No signs of deep infection.  Ortho/MS: She is ambulatory with flaccid lower extremity.  MRI was reviewed which was negative for osteomyelitis or deep infection.  It did show inflammation around the superficial skin.  Assessment: Left heel decubitus ulceration  Plan: I was able to gently remove the overlying eschar today.  Surprisingly there was a fair amount of good healthy proliferative skin and proliferative granular tissue in the central aspect.  I believe at this time continue local wound care would be warranted.  No deep debridement is needed.  Continued pressure reducing method should be performed on a regular basis.  She is having Santyl ointment to the lateral left leg wound and we can continue with Santyl to the posterior heel wound at this time as there was a little bit of mixed granular fibrotic tissue.  I will see her on an outpatient basis at this time.  I do not believe surgical debridement is needed currently.  Will order pressure reducing boots to the left lower extremity at this time.    Elesa Hacker, DPM Cell [phone]   02/11/2018 2:59 PM

## 2018-02-11 NOTE — Progress Notes (Signed)
[facility]  Daily Progress Note   Subjective  - 8 Days Post-Op  Patient reports she no longer has abdominal pain Dr Vickki Muff has evaluated her left heel and notes it is superficial  Objective Vitals:   02/10/18 2005 02/11/18 0516 02/11/18 1245 02/11/18 1942  BP: 116/71 131/74 139/71 110/65  Pulse: 90 82 84 78  Resp: 18 20 20 18   Temp: 98.1 F (36.7 C) 97.9 F (36.6 C) 97.9 F (36.6 C) 98.2 F (36.8 C)  TempSrc: Oral Oral Oral Oral  SpO2: 90% 97% 97% 100%  Weight:      Height:        Intake/Output Summary (Last 24 hours) at 02/11/2018 2106 Last data filed at 02/11/2018 1449 Gross per 24 hour  Intake 240 ml  Output 350 ml  Net -110 ml    PULM  Normal effort , no use of accessory muscles CV  No JVD, RRR Abd      No distended, nontender VASC  left heel ulceration bilateral posterior calf ulcerations; pedal pulses nonpalpable  Laboratory CBC    Component Value Date/Time   WBC 7.9 02/07/2018 0409   HGB 10.0 (L) 02/07/2018 0409   HCT 29.6 (L) 02/07/2018 0409   PLT 217 02/07/2018 0409    BMET    Component Value Date/Time   NA 137 02/08/2018 1834   NA 142 01/24/2016 1232   K 5.2 (H) 02/08/2018 1834   CL 100 02/08/2018 1834   CO2 29 02/08/2018 1834   GLUCOSE 115 (H) 02/08/2018 1834   BUN 14 02/08/2018 1834   BUN 13 01/24/2016 1232   CREATININE 0.48 02/08/2018 1834   CALCIUM 9.8 02/08/2018 1834   GFRNONAA >60 02/08/2018 1834   GFRAA >60 02/08/2018 1834    Assessment/Planning: The patient has had successful revascularization of both lower extremities.  She has remained in the hospital for other medical regions her constipation and abdominal symptoms have now resolved.  I will now proceed with [place]  02/11/2018, 9:06 PM

## 2018-02-11 NOTE — Care Management (Signed)
Successful revascularization of right lower extremity.  Podiatry removed eschar from wound and surgical debridement is ot needed at this time.

## 2018-02-11 NOTE — Care Management Important Message (Signed)
Copy of signed IM left with patient in room.  

## 2018-02-11 NOTE — Progress Notes (Signed)
Patient is in good spirits, grounded in faith, and ready for the next step concerning possible surgery. Patient feels that she needs to be in the hospital to be close to doctors. Chaplain engaged in a supportive conversation and provided prayer to both the patient and her son.

## 2018-02-12 DIAGNOSIS — I10 Essential (primary) hypertension: Secondary | ICD-10-CM | POA: Diagnosis not present

## 2018-02-12 DIAGNOSIS — R110 Nausea: Secondary | ICD-10-CM | POA: Diagnosis not present

## 2018-02-12 DIAGNOSIS — E785 Hyperlipidemia, unspecified: Secondary | ICD-10-CM | POA: Diagnosis not present

## 2018-02-12 DIAGNOSIS — K5903 Drug induced constipation: Secondary | ICD-10-CM | POA: Diagnosis not present

## 2018-02-12 DIAGNOSIS — I70249 Atherosclerosis of native arteries of left leg with ulceration of unspecified site: Secondary | ICD-10-CM | POA: Diagnosis not present

## 2018-02-12 DIAGNOSIS — R140 Abdominal distension (gaseous): Secondary | ICD-10-CM | POA: Diagnosis not present

## 2018-02-12 DIAGNOSIS — L97909 Non-pressure chronic ulcer of unspecified part of unspecified lower leg with unspecified severity: Secondary | ICD-10-CM | POA: Diagnosis not present

## 2018-02-12 DIAGNOSIS — M6281 Muscle weakness (generalized): Secondary | ICD-10-CM | POA: Diagnosis not present

## 2018-02-12 DIAGNOSIS — L89329 Pressure ulcer of left buttock, unspecified stage: Secondary | ICD-10-CM | POA: Diagnosis not present

## 2018-02-12 DIAGNOSIS — R143 Flatulence: Secondary | ICD-10-CM | POA: Diagnosis not present

## 2018-02-12 DIAGNOSIS — K5909 Other constipation: Secondary | ICD-10-CM | POA: Diagnosis not present

## 2018-02-12 DIAGNOSIS — B351 Tinea unguium: Secondary | ICD-10-CM | POA: Diagnosis not present

## 2018-02-12 DIAGNOSIS — I70209 Unspecified atherosclerosis of native arteries of extremities, unspecified extremity: Secondary | ICD-10-CM | POA: Diagnosis not present

## 2018-02-12 DIAGNOSIS — L89154 Pressure ulcer of sacral region, stage 4: Secondary | ICD-10-CM | POA: Diagnosis not present

## 2018-02-12 DIAGNOSIS — I890 Lymphedema, not elsewhere classified: Secondary | ICD-10-CM | POA: Diagnosis not present

## 2018-02-12 DIAGNOSIS — L89621 Pressure ulcer of left heel, stage 1: Secondary | ICD-10-CM | POA: Diagnosis not present

## 2018-02-12 DIAGNOSIS — S91302D Unspecified open wound, left foot, subsequent encounter: Secondary | ICD-10-CM | POA: Diagnosis not present

## 2018-02-12 DIAGNOSIS — I739 Peripheral vascular disease, unspecified: Secondary | ICD-10-CM | POA: Diagnosis not present

## 2018-02-12 DIAGNOSIS — G8929 Other chronic pain: Secondary | ICD-10-CM | POA: Diagnosis not present

## 2018-02-12 DIAGNOSIS — R262 Difficulty in walking, not elsewhere classified: Secondary | ICD-10-CM | POA: Diagnosis not present

## 2018-02-12 DIAGNOSIS — Z9229 Personal history of other drug therapy: Secondary | ICD-10-CM | POA: Diagnosis not present

## 2018-02-12 DIAGNOSIS — S91302A Unspecified open wound, left foot, initial encounter: Secondary | ICD-10-CM | POA: Diagnosis not present

## 2018-02-12 DIAGNOSIS — R141 Gas pain: Secondary | ICD-10-CM | POA: Diagnosis not present

## 2018-02-12 DIAGNOSIS — Z9862 Peripheral vascular angioplasty status: Secondary | ICD-10-CM | POA: Diagnosis not present

## 2018-02-12 DIAGNOSIS — I70213 Atherosclerosis of native arteries of extremities with intermittent claudication, bilateral legs: Secondary | ICD-10-CM | POA: Diagnosis not present

## 2018-02-12 DIAGNOSIS — I70239 Atherosclerosis of native arteries of right leg with ulceration of unspecified site: Secondary | ICD-10-CM | POA: Diagnosis not present

## 2018-02-12 DIAGNOSIS — M5010 Cervical disc disorder with radiculopathy, unspecified cervical region: Secondary | ICD-10-CM | POA: Diagnosis not present

## 2018-02-12 DIAGNOSIS — I872 Venous insufficiency (chronic) (peripheral): Secondary | ICD-10-CM | POA: Diagnosis not present

## 2018-02-12 DIAGNOSIS — G9589 Other specified diseases of spinal cord: Secondary | ICD-10-CM | POA: Diagnosis not present

## 2018-02-12 DIAGNOSIS — I70299 Other atherosclerosis of native arteries of extremities, unspecified extremity: Secondary | ICD-10-CM | POA: Diagnosis not present

## 2018-02-12 DIAGNOSIS — R279 Unspecified lack of coordination: Secondary | ICD-10-CM | POA: Diagnosis not present

## 2018-02-12 DIAGNOSIS — Z23 Encounter for immunization: Secondary | ICD-10-CM | POA: Diagnosis not present

## 2018-02-12 DIAGNOSIS — M79675 Pain in left toe(s): Secondary | ICD-10-CM | POA: Diagnosis not present

## 2018-02-12 DIAGNOSIS — L97811 Non-pressure chronic ulcer of other part of right lower leg limited to breakdown of skin: Secondary | ICD-10-CM | POA: Diagnosis not present

## 2018-02-12 DIAGNOSIS — H6060 Unspecified chronic otitis externa, unspecified ear: Secondary | ICD-10-CM | POA: Diagnosis not present

## 2018-02-12 DIAGNOSIS — L98491 Non-pressure chronic ulcer of skin of other sites limited to breakdown of skin: Secondary | ICD-10-CM | POA: Diagnosis not present

## 2018-02-12 DIAGNOSIS — Z9181 History of falling: Secondary | ICD-10-CM | POA: Diagnosis not present

## 2018-02-12 DIAGNOSIS — M19011 Primary osteoarthritis, right shoulder: Secondary | ICD-10-CM | POA: Diagnosis not present

## 2018-02-12 DIAGNOSIS — L97821 Non-pressure chronic ulcer of other part of left lower leg limited to breakdown of skin: Secondary | ICD-10-CM | POA: Diagnosis not present

## 2018-02-12 DIAGNOSIS — H6123 Impacted cerumen, bilateral: Secondary | ICD-10-CM | POA: Diagnosis not present

## 2018-02-12 DIAGNOSIS — M48061 Spinal stenosis, lumbar region without neurogenic claudication: Secondary | ICD-10-CM | POA: Diagnosis not present

## 2018-02-12 DIAGNOSIS — L98499 Non-pressure chronic ulcer of skin of other sites with unspecified severity: Secondary | ICD-10-CM | POA: Diagnosis not present

## 2018-02-12 DIAGNOSIS — M792 Neuralgia and neuritis, unspecified: Secondary | ICD-10-CM | POA: Diagnosis not present

## 2018-02-12 DIAGNOSIS — S81802D Unspecified open wound, left lower leg, subsequent encounter: Secondary | ICD-10-CM | POA: Diagnosis not present

## 2018-02-12 DIAGNOSIS — M79674 Pain in right toe(s): Secondary | ICD-10-CM | POA: Diagnosis not present

## 2018-02-12 DIAGNOSIS — M19012 Primary osteoarthritis, left shoulder: Secondary | ICD-10-CM | POA: Diagnosis not present

## 2018-02-12 DIAGNOSIS — L89153 Pressure ulcer of sacral region, stage 3: Secondary | ICD-10-CM | POA: Diagnosis not present

## 2018-02-12 DIAGNOSIS — Z7401 Bed confinement status: Secondary | ICD-10-CM | POA: Diagnosis not present

## 2018-02-12 NOTE — Discharge Planning (Addendum)
Patient IJ removed per verbal orders.  LF groin dressing changed - both clean/intact at discharge.  RN assessment and VS revealed stability for discharge to [facility].  Report called and s/w Peri Maris, LPN. Informed of suggested FU appts and facility will arrange to get there when able.  Tramadol STOPPED, so no need to include script in packet. Discharge papers included in facility packet.  EMS called to transport to room 318. Waiting on arrival.

## 2018-02-12 NOTE — Clinical Social Work Placement (Signed)
   CLINICAL SOCIAL WORK PLACEMENT  NOTE  Date:  02/12/2018  Patient Details  Name: Katherine Owen MRN: [id number] Date of Birth: 11/11/1927  Clinical Social Work is seeking post-discharge placement for this patient at the [employer] level of care (*CSW will initial, date and re-position this form in  chart as items are completed):  Yes   Patient/family provided with [facility] Work Department's list of facilities offering this level of care within the geographic area requested by the patient (or if unable, by the patient's family).  Yes   Patient/family informed of their freedom to choose among providers that offer the needed level of care, that participate in Medicare, Medicaid or managed care program needed by the patient, have an available bed and are willing to accept the patient.  Yes   Patient/family informed of [facility]'s ownership interest in [facility] and [facility], as well as of the fact that they are under no obligation to receive care at these facilities.  PASRR submitted to EDS on       PASRR number received on       Existing PASRR number confirmed on 02/11/18     FL2 transmitted to all facilities in geographic area requested by pt/family on 02/11/18     FL2 transmitted to all facilities within larger geographic area on       Patient informed that his/her managed care company has contracts with or will negotiate with certain facilities, including the following:        Yes   Patient/family informed of bed offers received.  Patient chooses bed at [facility])     Physician recommends and patient chooses bed at [facility])    Patient to be transferred to [facility]) on 02/12/18.  Patient to be transferred to facility by (EMS)     Patient family notified on 02/12/18 of transfer.  Name of family member notified:  (son: [name])     PHYSICIAN       Additional Comment:     _______________________________________________ Shela Leff, LCSW 02/12/2018, 11:40 AM

## 2018-02-12 NOTE — Clinical Social Work Note (Signed)
Patient discharging today to [facility]. Discharge information sent. Patient's daughter notified of discharge by Dr. Posey Pronto. Shela Leff MSW,LCSW [phone]

## 2018-02-12 NOTE — Discharge Summary (Signed)
[facility]    Discharge Summary    Patient ID:  Katherine Owen MRN: [id number] DOB/AGE: Feb 28, 1928 84 y.o.  Admit date: 02/03/2018 Discharge date: 02/12/2018 Date of Surgery: 02/10/2018 Surgeon: Surgeon(s): Lanyah Spengler, Dolores Lory, MD  Admission Diagnosis: Atherosclerosis of artery of extremity with ulceration ([place]) [I70.299, L97.909]  Discharge Diagnoses:  Atherosclerosis of artery of extremity with ulceration ([place]) [I70.299, L97.909]  Secondary Diagnoses: Past Medical History:  Diagnosis Date  . Allergy   . Arthritis   . Cataract    Bilateral  . Essential hypertension 05/02/2014  . GERD (gastroesophageal reflux disease)   . Headache   . History of shingles   . HLD (hyperlipidemia) 05/02/2014  . Hyperlipidemia   . Hypertension   . Neuropathic pain 05/02/2014  . Osteoarthritis of both shoulders due to rotator cuff injury 04/23/2016  . Poor mobility 08/28/2015  . 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 Spinal stenosis of lumbar region 05/02/2014   S/p decompression at [facility] November 2015     Procedure(s): LOWER EXTREMITY ANGIOGRAPHY  Discharged Condition: fair  HPI:  On the day the patient was brought into the hospital she underwent arterial revascularization of the left lower extremity.  This was successful with reconstruction of her tibioperoneal trunk and peroneal.  However given her disadvantaged outflow and her multiple wounds I elected to bring her into the hospital under observation and initiate parenteral anticoagulation with heparin.  She did very well over the first 24 hours and the heparin was then stopped and she was transitioned to oral antiplatelet agents with Plavix and aspirin.  Unfortunately she developed significant nausea and vomiting medical consultation was obtained.  It was felt that she was suffering from severe constipation.  This was refractory to standard treatments and ultimately a GI consultation was obtained and she  was successfully treated and has been having regular bowel movements over the past 4 days.  She was also evaluated by the wound nurse.  General surgical consultation was obtained secondary to the decubitus.  This was found to be fairly superficial and recommendations for continued treatment with Santyl were followed.  Ultimately since she was still in the hospital and her right leg was also problematic with atherosclerotic changes associated with ulceration of the ankle and calf she did undergo right lower extremity angiography.  This was successful with recanalization of critical stenoses within the distal SFA and popliteal.  Her tibial vessels on the right were significantly better than the left.  Having reestablish in-line flow bilaterally I did ask podiatry to see her heel decubitus.  This was debrided yesterday and found to be superficial as well.  MRI of the left ankle and heel was obtained soft tissue inflammation was noted but there is no evidence of osteomyelitis consistent with Dr. Alvera Singh findings after debridement.  At this point she is felt to be fit for discharge from an acute care facility.  She clearly has multiple very complicated wounds which will require skilled nursing.  Given her paralysis she is not a candidate for rehab.  Hospital Course:  Katherine Owen is a 84 y.o. female is S/P Bilateral Procedure(s): LOWER EXTREMITY ANGIOGRAPHY Extubated: POD # 0 Physical exam: Sacral decubitus demonstrates some eschar that is being treated enzymatically.  Bilateral calf ulcers also being treated with Santyl ointment.  He will has been debridement and is noted to be superficial without evidence of osteomyelitis Pt. unable to ambulate, voiding and taking PO diet without difficulty.  She is not having regular  bowel movements Pt pain controlled with PO pain meds. Labs as below Complications:none  Consults:  Treatment Team:  Vaughan Basta, MD Samara Deist, DPM  Significant  Diagnostic Studies: CBC Lab Results  Component Value Date   WBC 7.9 02/07/2018   HGB 10.0 (L) 02/07/2018   HCT 29.6 (L) 02/07/2018   MCV 81.7 02/07/2018   PLT 217 02/07/2018    BMET    Component Value Date/Time   NA 137 02/08/2018 1834   NA 142 01/24/2016 1232   K 5.2 (H) 02/08/2018 1834   CL 100 02/08/2018 1834   CO2 29 02/08/2018 1834   GLUCOSE 115 (H) 02/08/2018 1834   BUN 14 02/08/2018 1834   BUN 13 01/24/2016 1232   CREATININE 0.48 02/08/2018 1834   CALCIUM 9.8 02/08/2018 1834   GFRNONAA >60 02/08/2018 1834   GFRAA >60 02/08/2018 1834   COAG Lab Results  Component Value Date   INR 1.10 02/03/2018     Disposition:  Discharge to :Skilled nursing facility Discharge Instructions    Call MD for:  redness, tenderness, or signs of infection (pain, swelling, bleeding, redness, odor or green/yellow discharge around incision site)   Complete by:  As directed    Call MD for:  severe or increased pain, loss or decreased feeling  in affected limb(s)   Complete by:  As directed    Call MD for:  temperature >100.5   Complete by:  As directed    No dressing needed   Complete by:  As directed    Wound care: Wounds are being dressed daily with Santyl and a light gauze dressing this is to the sacral decubitus the heel as well as the calf wounds.  They are then wrapped with Kerlix.  Bunny boots are to be applied to both feet and offloading of the heel is mandatory.  please consult the on-site wound care specialist at the facility.   Resume previous diet   Complete by:  As directed      Allergies as of 02/12/2018   No Known Allergies     Medication List    TAKE these medications   atorvastatin 10 MG tablet Commonly known as:  LIPITOR Take 1 tablet (10 mg total) by mouth at bedtime. For high cholesterol   celecoxib 200 MG capsule Commonly known as:  CELEBREX Take 1 capsule (200 mg total) by mouth daily.   CENTRUM SILVER tablet Take 1 tablet by mouth daily.    furosemide 40 MG tablet Commonly known as:  LASIX Take 1 tablet (40 mg total) by mouth daily.   gabapentin 100 MG capsule Commonly known as:  NEURONTIN Take 1 capsule (100 mg total) by mouth 3 (three) times daily.   losartan 100 MG tablet Commonly known as:  COZAAR Take 1 tablet (100 mg total) by mouth daily.   montelukast 10 MG tablet Commonly known as:  SINGULAIR Take 1 tablet (10 mg total) by mouth at bedtime.   NONFORMULARY OR COMPOUNDED ITEM Lift recliner #1  As directed  Dx spinal stenosis, osteoarthritis both shoulder ,  Low ext weakness   pantoprazole 40 MG tablet Commonly known as:  PROTONIX Take 1 tablet (40 mg total) by mouth daily.   SANTYL ointment Generic drug:  collagenase Apply 1 application topically daily.   senna-docusate 8.6-50 MG tablet Commonly known as:  Senokot-S Take 1 tablet by mouth at bedtime.   traMADol 50 MG tablet Commonly known as:  ULTRAM Take 1 tablet (50 mg total) by mouth 3 (  three) times daily.   Vitamin D3 5000 units Caps Take 5,000 Units by mouth at bedtime.   Wheelchair Misc Use as directed            Museum/gallery conservator  (From admission, onward)         Start     Ordered   02/05/18 1158  For home use only DME Hospital bed  Once    Question Answer Comment  Patient has (list medical condition): sacral decubitus ulcer   The above medical condition requires: Patient requires the ability to reposition frequently   Head must be elevated greater than: 30 degrees   Bed type Semi-electric   Support Surface: Low Air loss Mattress      02/05/18 1200   02/05/18 1145  For home use only DME Other see comment  Once    Comments:  ROHO wheelchair cushion low profile   02/05/18 1200         Verbal and written Discharge instructions given to the patient. Wound care per Discharge AVS Contact information for after-discharge care    Destination    [facility] .   Service:  Skilled  Nursing Contact information: [address] [place] [phone]              Signed: Hortencia Pilar, MD  02/12/2018, 10:47 AM

## 2018-02-12 NOTE — Clinical Social Work Note (Signed)
Clinical Social Work Assessment  Patient Details  Name: Katherine Owen MRN: [id number] Date of Birth: 1927-08-15  Date of referral:  02/12/18               Reason for consult:  Discharge Planning                Permission sought to share information with:    Permission granted to share information::     Name::        Agency::     Relationship::     Contact Information:     Housing/Transportation Living arrangements for the past 2 months:  Single Family Home Source of Information:  Patient, Adult Children Patient Interpreter Needed:  None Criminal Activity/Legal Involvement Pertinent to Current Situation/Hospitalization:  No - Comment as needed Significant Relationships:  Adult Children Lives with:  Self Do you feel safe going back to the place where you live?    Need for family participation in patient care:  Yes (Comment)  Care giving concerns:  Patient resides alone and is total care.   Social Worker assessment / plan:  Patient has now met a 3 night inpatient qualifying stay and wishes to pursue short term placement for wound care management. CSW also spoke with patient's son: Cloria [place]: [phone] and extended bed offers. [facility] was chosen and CSW Systems developer at [employer] CSW to notify MD.   Employment status:    Insurance information:    PT Recommendations:  [place] / Referral to community resources:     Patient/Family's Response to care:  Patient's son expressed appreciation for CSW assistance.  Patient/Family's Understanding of and Emotional Response to Diagnosis, Current Treatment, and Prognosis:  Patient relieved to know that she was going to be placed in a facility.  Emotional Assessment Appearance:  Appears stated age Attitude/Demeanor/Rapport:  (pleasant and cooperative) Affect (typically observed):    Orientation:  Oriented to Self, Oriented to Place, Oriented to Situation Alcohol / Substance use:  Not  Applicable Psych involvement (Current and /or in the community):  No (Comment)  Discharge Needs  Concerns to be addressed:  Care Coordination Readmission within the last 30 days:  No Current discharge risk:  None Barriers to Discharge:  No Barriers Identified   Shela Leff, LCSW 02/12/2018, 10:16 AM

## 2018-02-13 ENCOUNTER — Telehealth: Payer: Self-pay

## 2018-02-13 NOTE — Telephone Encounter (Signed)
Patient will be admitted to [facility] after discharge from hospital.

## 2018-02-17 DIAGNOSIS — L89154 Pressure ulcer of sacral region, stage 4: Secondary | ICD-10-CM | POA: Diagnosis not present

## 2018-02-17 DIAGNOSIS — S91302A Unspecified open wound, left foot, initial encounter: Secondary | ICD-10-CM | POA: Diagnosis not present

## 2018-02-17 DIAGNOSIS — R262 Difficulty in walking, not elsewhere classified: Secondary | ICD-10-CM | POA: Diagnosis not present

## 2018-02-17 DIAGNOSIS — L98499 Non-pressure chronic ulcer of skin of other sites with unspecified severity: Secondary | ICD-10-CM | POA: Diagnosis not present

## 2018-03-05 ENCOUNTER — Ambulatory Visit: Payer: Medicare Other | Admitting: Family Medicine

## 2018-03-10 DIAGNOSIS — G8929 Other chronic pain: Secondary | ICD-10-CM | POA: Diagnosis not present

## 2018-03-11 ENCOUNTER — Ambulatory Visit (INDEPENDENT_AMBULATORY_CARE_PROVIDER_SITE_OTHER): Payer: Medicare Other | Admitting: Nurse Practitioner

## 2018-03-11 ENCOUNTER — Other Ambulatory Visit (INDEPENDENT_AMBULATORY_CARE_PROVIDER_SITE_OTHER): Payer: Self-pay | Admitting: Nurse Practitioner

## 2018-03-11 ENCOUNTER — Encounter (INDEPENDENT_AMBULATORY_CARE_PROVIDER_SITE_OTHER): Payer: Self-pay | Admitting: Nurse Practitioner

## 2018-03-11 ENCOUNTER — Other Ambulatory Visit (INDEPENDENT_AMBULATORY_CARE_PROVIDER_SITE_OTHER): Payer: Self-pay | Admitting: Vascular Surgery

## 2018-03-11 ENCOUNTER — Other Ambulatory Visit (INDEPENDENT_AMBULATORY_CARE_PROVIDER_SITE_OTHER): Payer: Medicare Other

## 2018-03-11 VITALS — BP 116/69 | HR 97 | Resp 18 | Ht 64.0 in | Wt 144.0 lb

## 2018-03-11 DIAGNOSIS — I10 Essential (primary) hypertension: Secondary | ICD-10-CM

## 2018-03-11 DIAGNOSIS — I890 Lymphedema, not elsewhere classified: Secondary | ICD-10-CM

## 2018-03-11 DIAGNOSIS — E785 Hyperlipidemia, unspecified: Secondary | ICD-10-CM

## 2018-03-11 DIAGNOSIS — L97909 Non-pressure chronic ulcer of unspecified part of unspecified lower leg with unspecified severity: Secondary | ICD-10-CM

## 2018-03-11 DIAGNOSIS — Z9862 Peripheral vascular angioplasty status: Secondary | ICD-10-CM

## 2018-03-11 DIAGNOSIS — I70239 Atherosclerosis of native arteries of right leg with ulceration of unspecified site: Secondary | ICD-10-CM | POA: Diagnosis not present

## 2018-03-11 DIAGNOSIS — I70249 Atherosclerosis of native arteries of left leg with ulceration of unspecified site: Secondary | ICD-10-CM | POA: Diagnosis not present

## 2018-03-11 DIAGNOSIS — I70299 Other atherosclerosis of native arteries of extremities, unspecified extremity: Secondary | ICD-10-CM

## 2018-03-11 DIAGNOSIS — H6060 Unspecified chronic otitis externa, unspecified ear: Secondary | ICD-10-CM | POA: Diagnosis not present

## 2018-03-11 DIAGNOSIS — H6123 Impacted cerumen, bilateral: Secondary | ICD-10-CM | POA: Diagnosis not present

## 2018-03-11 NOTE — Progress Notes (Signed)
Subjective:    Patient ID: Katherine Owen, female    DOB: Jan 27, 1928, 84 y.o.   MRN: [id number] Chief Complaint  Patient presents with  . Follow-up    [facility] 1 month ABI    HPI  Katherine Owen is a 84 y.o. female that presents following bilateral angiograms.  On 02/03/2018 she underwent PTA of the left tibioperoneal trunk and peroneal arteries.  On 02/10/2018 she underwent PTA of the right common and superficial femoral arteries.  Prior to the intervention, Katherine Owen had bilateral wounds on her calves as well as a wound on her left heel.  These wounds were persistent and nonhealing.  The patient's son reports that with continued wound care following the intervention the wounds have indeed gotten better.  He reports that the wound specialist at her assisted living facility states that they are beginning to heal well.  The patient also reports having decreased levels of pain in her bilateral lower extremities since the intervention.  She states that they are beginning to feel much better.  She denies having any swelling following the procedure.  The patient underwent bilateral ABIs today.  Her right lower extremity had an ABI of 1.11, with a ABI of 1.21 on her left lower extremity.  The TBI on her right lower extremity is 0.50, with a TBI of 0.48 on her left lower extremity.  She had biphasic flow in her posterior tibial artery on her right lower extremity, with triphasic flow in the peroneal artery.  The anterior tibial artery was not detected.  The left lower extremity found biphasic flows in the posterior tibial artery as well as the peroneal artery.  The anterior tibial artery displayed monophasic flows that may be attributed to collateral flow.  While there are not previous ABIs available for comparison a limited arterial duplex study done on 01/20/2018 revealed monophasic flow in the left peroneal artery, which is improved now.  Also in the previous study no flow was detected in the left posterior  tibial arteries which has now improved.  Review of Systems: Negative Unless Checked Constitutional: [] Weight loss  [] Fever  [] Chills Cardiac: [] Chest pain   [] Chest pressure   [] Palpitations   [] Shortness of breath when laying flat   [] Shortness of breath with exertion. Vascular:  [] Pain in legs with walking   [] Pain in legs with standing  [] History of DVT   [] Phlebitis   [] Swelling in legs   [] Varicose veins   [x] Non-healing ulcers Pulmonary:   [] Uses home oxygen   [] Productive cough   [] Hemoptysis   [] Wheeze  [] COPD   [] Asthma Neurologic:  [] Dizziness   [] Seizures   [] History of stroke   [] History of TIA  [] Aphasia   [] Vissual changes   [] Weakness or numbness in arm   [x] Weakness or numbness in leg Musculoskeletal:   [] Joint swelling   [] Joint pain   [] Low back pain Hematologic:  [] Easy bruising  [] Easy bleeding   [] Hypercoagulable state   [] Anemic Gastrointestinal:  [] Diarrhea   [] Vomiting  [] Gastroesophageal reflux/heartburn   [] Difficulty swallowing. Genitourinary:  [] Chronic kidney disease   [] Difficult urination  [] Frequent urination   [] Blood in urine Skin:  [] Rashes   [] Ulcers  Psychological:  [] History of anxiety   []  History of major depression.     Objective:   Physical Exam  BP 116/69 (BP Location: Left Arm)   Pulse 97   Resp 18   Ht 5\' 4"  (1.626 m)   Wt 144 lb (65.3 kg)   BMI 24.72 kg/m  Past Medical History:  Diagnosis Date  . Allergy   . Arthritis   . Cataract    Bilateral  . Essential hypertension 05/02/2014  . GERD (gastroesophageal reflux disease)   . Headache   . History of shingles   . HLD (hyperlipidemia) 05/02/2014  . Hyperlipidemia   . Hypertension   . Neuropathic pain 05/02/2014  . Osteoarthritis of both shoulders due to rotator cuff injury 04/23/2016  . Poor mobility 08/28/2015  . 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 Spinal stenosis of lumbar region 05/02/2014   S/p decompression at [facility] November 2015      Gen: WD/WN, NAD Head:  [place]/AT, No temporalis wasting.  Ear/Nose/Throat: Hearing grossly intact, nares w/o erythema or drainage Eyes: PER, EOMI, sclera nonicteric.  Neck: Supple, no masses.  No JVD.  Pulmonary:  Good air movement, no use of accessory muscles.  Cardiac: RRR Vascular:  Minimal swelling present, wound shows signs of healing. Vessel Right Left  PT  palpable  palpable  DP  not palpable  not palpable  Gastrointestinal: soft, non-distended. No guarding/no peritoneal signs.  Musculoskeletal: M/S 5/5 throughout.  No deformity or atrophy.  Neurologic: Pain and light touch intact in extremities.  Symmetrical.  Speech is fluent. Motor exam as listed above. Psychiatric: Judgment intact, Mood & affect appropriate for pt's clinical situation. Dermatologic: No Venous rashes.   No changes consistent with cellulitis. Lymph : No Cervical lymphadenopathy,    Social History   Socioeconomic History  . Marital status: Widowed    Spouse name: Not on file  . Number of children: 3  . Years of education: Not on file  . Highest education level: Not on file  Occupational History  . Occupation: Microbiologist  Social Needs  . Financial resource strain: Not on file  . Food insecurity:    Worry: Not on file    Inability: Not on file  . Transportation needs:    Medical: Not on file    Non-medical: Not on file  Tobacco Use  . Smoking status: Former Smoker    Packs/day: 1.00    Years: 20.00    Pack years: 20.00    Types: Cigarettes    Last attempt to quit: 04/16/1967    Years since quitting: 50.9  . Smokeless tobacco: Never Used  . Tobacco comment: quit somking in early  1970's  Substance and Sexual Activity  . Alcohol use: No    Alcohol/week: 0.0 standard drinks  . Drug use: No  . Sexual activity: Not Currently  Lifestyle  . Physical activity:    Days per week: Not on file    Minutes per session: Not on file  . Stress: Not on file  Relationships  . Social connections:    Talks  on phone: Not on file    Gets together: Not on file    Attends religious service: Not on file    Active member of club or organization: Not on file    Attends meetings of clubs or organizations: Not on file    Relationship status: Not on file  . Intimate partner violence:    Fear of current or ex partner: Not on file    Emotionally abused: Not on file    Physically abused: Not on file    Forced sexual activity: Not on file  Other Topics Concern  . Not on file  Social History Narrative  . Not on file    Past Surgical History:  Procedure Laterality Date  .  ANKLE SURGERY Left 2014  . BACK SURGERY  2015   [facility]   . BUNIONECTOMY Right   . COLONOSCOPY    . EYE SURGERY     cataract  . JOINT REPLACEMENT    . LOWER EXTREMITY ANGIOGRAPHY Left 02/03/2018   Procedure: LOWER EXTREMITY ANGIOGRAPHY;  Surgeon: Katha Cabal, MD;  Location: [facility];  Service: Cardiovascular;  Laterality: Left;  . LOWER EXTREMITY ANGIOGRAPHY Right 02/10/2018   Procedure: LOWER EXTREMITY ANGIOGRAPHY;  Surgeon: Katha Cabal, MD;  Location: [facility];  Service: Cardiovascular;  Laterality: Right;  . REVERSE SHOULDER ARTHROPLASTY Right 01/11/2016   Procedure: REVERSE SHOULDER ARTHROPLASTY;  Surgeon: Justice Britain, MD;  Location: [facility];  Service: Orthopedics;  Laterality: Right;  . SHOULDER SURGERY  01/11/2016   REVERSE SHOULDER ARTHROPLASTY on 01/11/2016  . SPINE SURGERY     done at [facility] Apr 26, 2014, lumbar decompression  . TOTAL HIP ARTHROPLASTY Bilateral 2005, 2009   [place] , [place]    Family History  Problem Relation Age of Onset  . Arthritis Mother        osteoarthritis  . Colon cancer Mother   . Arthritis Father        osteoarthritis  . Parkinson's disease Father   . Anesthesia problems Neg Hx     No Known Allergies     Assessment & Plan:   1. Atherosclerosis of artery of extremity with ulceration ([place]) Recommend:  The patient is status post successful angiogram  with intervention.  The patient reports that the rest symptoms are relieved and the wounds are beginning to heal.  No further invasive studies, angiography or surgery at this time The patient should continue walking and begin a more formal exercise program.  The patient should continue antiplatelet therapy and aggressive treatment of the lipid abnormalities .  Patient should undergo noninvasive studies as ordered in 3 months. The patient will follow up with me after the studies.   - VAS [place] ABI WITH/WO TBI; Future  2. Essential hypertension Continue antihypertensive medications as already ordered, these medications have been reviewed and there are no changes at this time.   3. Hyperlipidemia, unspecified hyperlipidemia type Continue statin as ordered and reviewed, no changes at this time   4. Lymphedema No surgery or intervention at this point in time.    I have reviewed my discussion with the patient regarding venous insufficiency and secondary lymph edema and why it  causes symptoms. I have discussed with the patient the chronic skin changes that accompany these problems and the long term sequela such as ulceration and infection.  Patient will continue wearing graduated compression stockings class 1 (20-30 mmHg) on a daily basis a prescription was given to the patient to keep this updated. The patient will  put the stockings on first thing in the morning and removing them in the evening. The patient is instructed specifically not to sleep in the stockings.  In addition, behavioral modification including elevation during the day will be continued.  Diet and salt restriction was also discussed.     Current Outpatient Medications on File Prior to Visit  Medication Sig Dispense Refill  . atorvastatin (LIPITOR) 10 MG tablet Take 1 tablet (10 mg total) by mouth at bedtime. For high cholesterol 90 tablet 1  . celecoxib (CELEBREX) 200 MG capsule Take 1 capsule (200 mg total) by mouth daily.  100 capsule 0  . Cholecalciferol (VITAMIN D3) 5000 units CAPS Take 5,000 Units by mouth at  bedtime.     . furosemide (LASIX) 40 MG tablet Take 1 tablet (40 mg total) by mouth daily. 90 tablet 1  . gabapentin (NEURONTIN) 100 MG capsule Take 1 capsule (100 mg total) by mouth 3 (three) times daily. 270 capsule 1  . losartan (COZAAR) 100 MG tablet Take 1 tablet (100 mg total) by mouth daily. 90 tablet 1  . Misc. Devices [facility]) MISC Use as directed 1 each 0  . montelukast (SINGULAIR) 10 MG tablet Take 1 tablet (10 mg total) by mouth at bedtime. 90 tablet 1  . Multiple Vitamins-Minerals (CENTRUM SILVER) tablet Take 1 tablet by mouth daily.    . mupirocin ointment (BACTROBAN) 2 % Place 1 application into the nose as needed.    . NONFORMULARY OR COMPOUNDED ITEM Lift recliner #1  As directed  Dx spinal stenosis, osteoarthritis both shoulder ,  Low ext weakness 1 each 0  . pantoprazole (PROTONIX) 40 MG tablet Take 1 tablet (40 mg total) by mouth daily. 90 tablet 1  . SANTYL ointment Apply 1 application topically daily.  3  . senna-docusate (SENOKOT-S) 8.6-50 MG per tablet Take 1 tablet by mouth at bedtime.     . traMADol (ULTRAM) 50 MG tablet Take by mouth.     No current facility-administered medications on file prior to visit.     There are no Patient Instructions on file for this visit. No follow-ups on file.   Kris Hartmann, NP

## 2018-03-22 IMAGING — MR MR CERVICAL SPINE W/O CM
4 of 5 series · 31 of 48 positions shown · non-contrast
Comparison: None.

CLINICAL DATA: Chronic, worsening bilateral lower extremity
paresthesias and bilateral arm pain and weakness, right worse than
left. Cervical disc disorder with myelopathy.

EXAM:
MRI CERVICAL SPINE WITHOUT CONTRAST
TECHNIQUE: Multiplanar, multisequence MR imaging of the cervical spine was
performed. No intravenous contrast was administered.

[Series 2: (id) tse sag · sagittal · 3.0mm · 0.41mm/px · 5 of 13 slices shown]
[im 1/13]
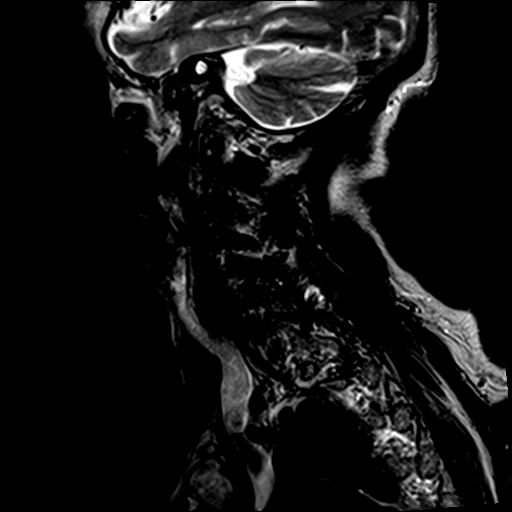
[im 4/13]
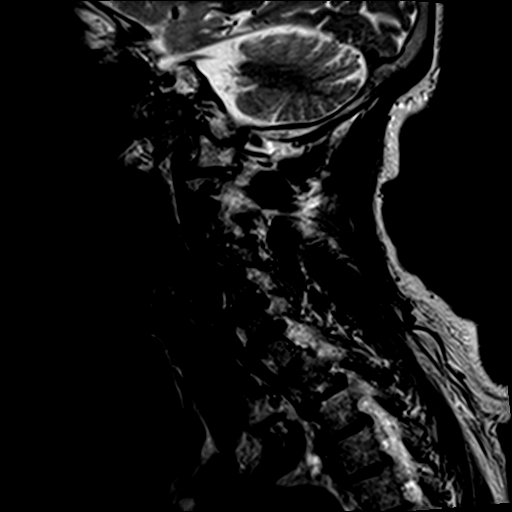
[im 7/13]
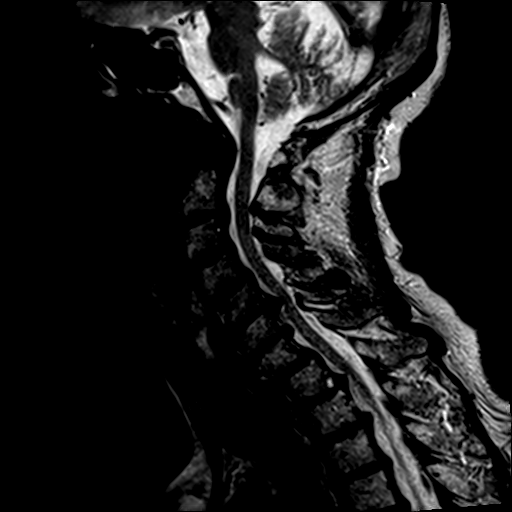
[im 10/13]
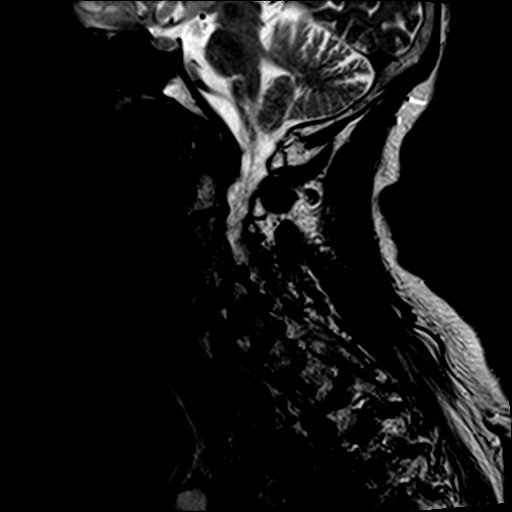
[im 13/13]
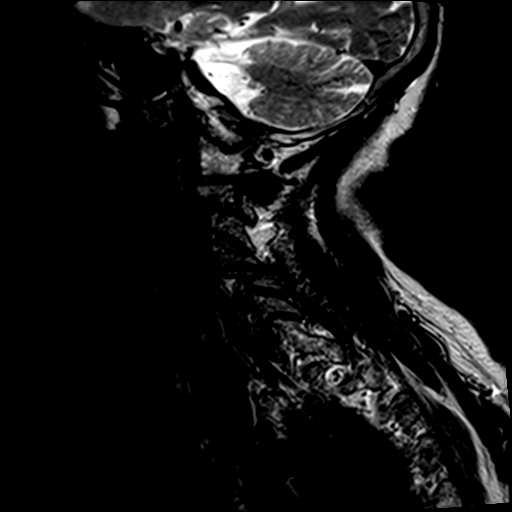

[Series 4: STIR · sagittal · 3.0mm · 0.82mm/px · 6 of 13 slices shown]
[im 1/13]
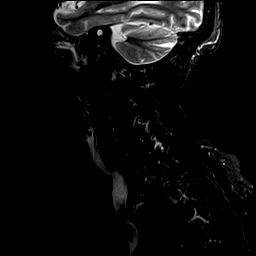
[im 3/13]
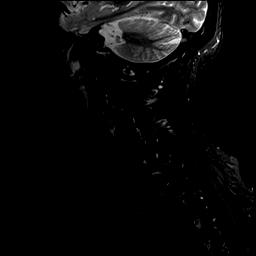
[im 5/13]
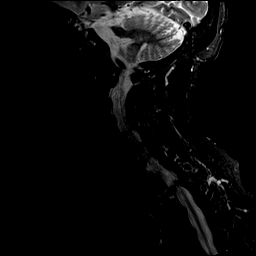
[im 8/13]
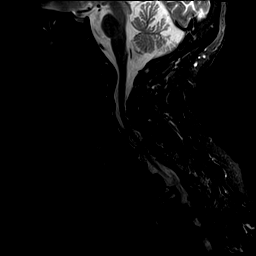
[im 10/13]
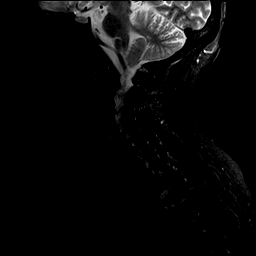
[im 13/13]
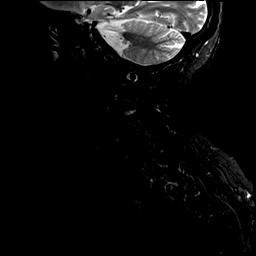

[Series 5: T2 · axial · 3.0mm · 0.39mm/px · z∈[-31,+67]mm · 10 of 36 slices shown (1 of 2)]
[im 3/36]
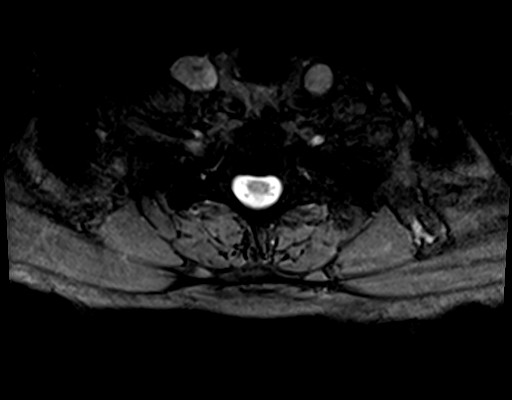
[im 5/36]
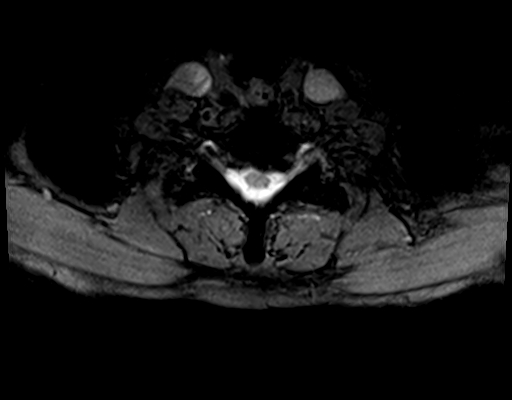
[im 8/36]
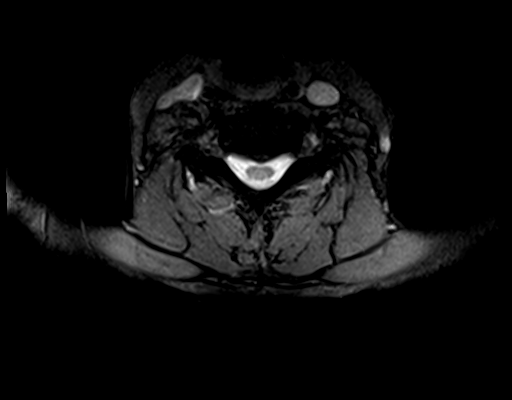
[im 12/36]
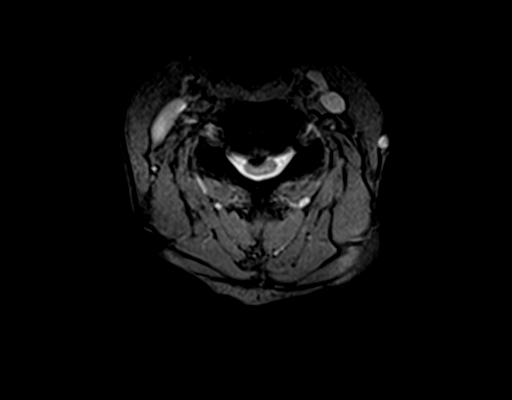
[im 17/36]
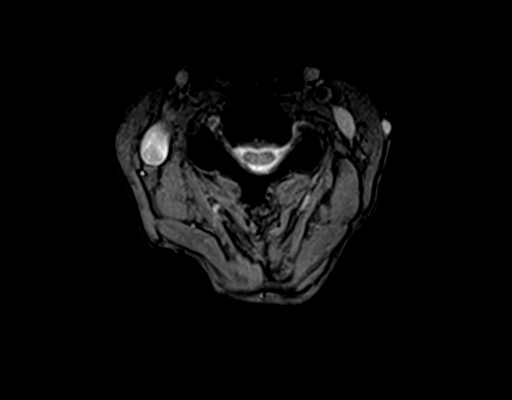
[im 19/36]
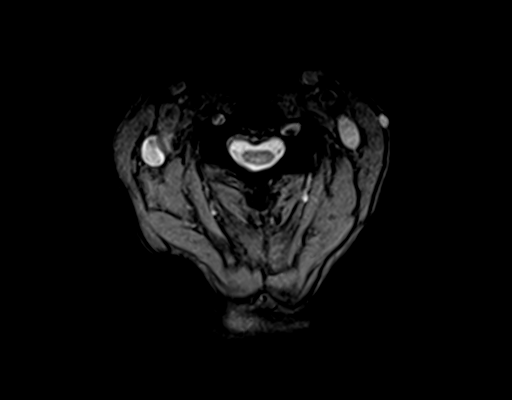
[im 22/36]
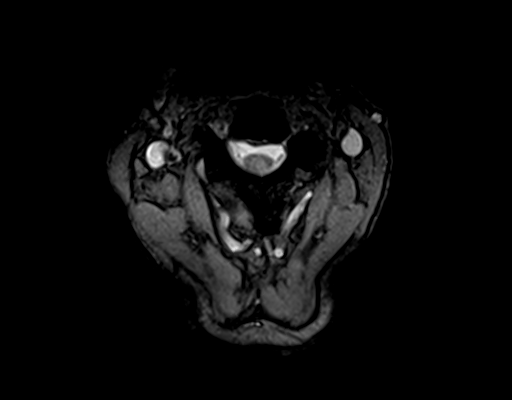
[im 26/36]
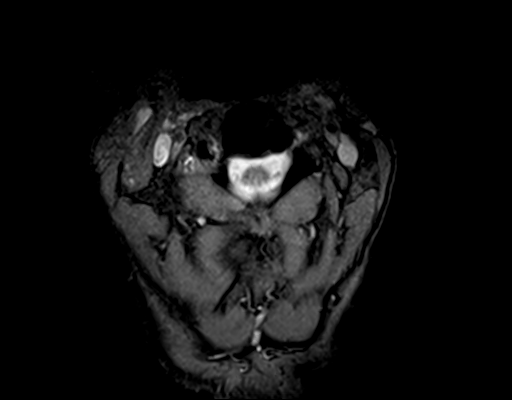
[im 31/36]
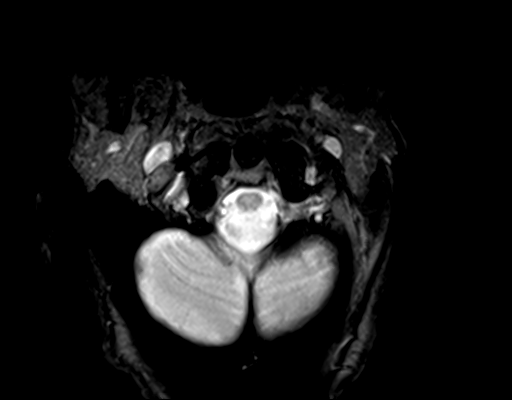
[im 36/36]
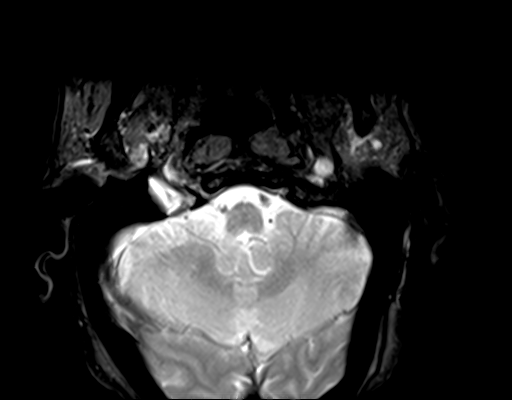

[Series 6: T2 · axial · 3.0mm · 0.62mm/px · z∈[-38,+60]mm · 10 of 36 slices shown (2 of 2)]
[im 3/36]
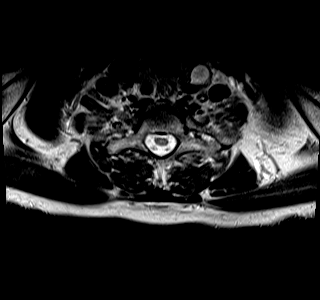
[im 5/36]
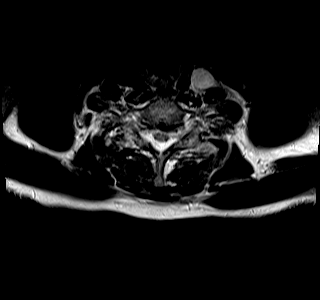
[im 8/36]
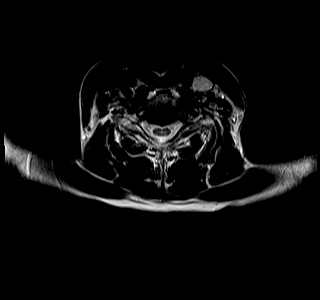
[im 12/36]
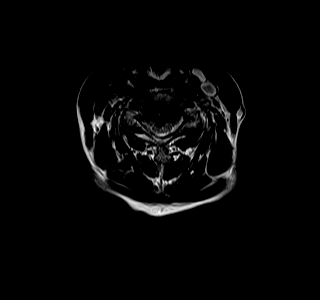
[im 17/36]
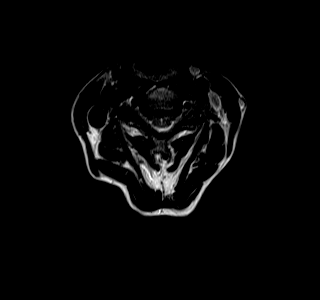
[im 19/36]
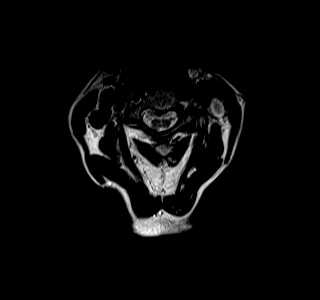
[im 22/36]
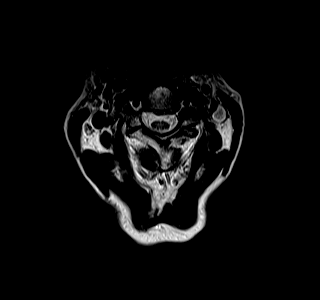
[im 26/36]
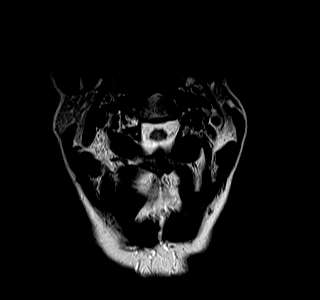
[im 31/36]
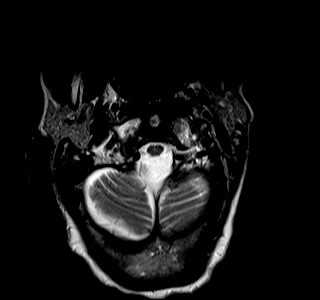
[im 36/36]
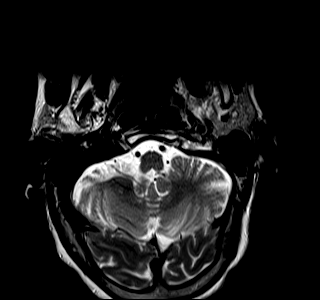

[31 of 48 positions shown; findings below may reference images not displayed]

FINDINGS: Alignment: Mild reversal of the normal lordosis in the lower
cervical spine. Grade 1 anterolisthesis of C4 on C5 and C7 on T1.

Vertebrae: Preserved vertebral body heights without evidence of
fracture or osseous lesion. Mild type 1 degenerative endplate
changes at T1-2.

Cord: Normal signal.

Posterior Fossa, vertebral arteries, paraspinal tissues:
Unremarkable.

Disc levels:

Diffuse cervical disc degeneration with mild disc space narrowing at
C2-3, moderate narrowing at C5-6 and C6-7, and severe narrowing at
C7-T1.

C2-3: Central disc protrusion without spinal stenosis. Uncovertebral
spurring and moderate left facet arthrosis result in mild bilateral
neural foraminal stenosis.

C3-4: Central disc protrusion without spinal stenosis. Uncovertebral
spurring and moderate left facet arthrosis result in mild left
neural foraminal stenosis.

C4-5: Disc bulging, uncovertebral spurring, and mild facet arthrosis
result in mild bilateral neural foraminal stenosis without spinal
stenosis.

C5-6: Moderately large right central disc extrusion results in
moderate spinal stenosis and mild-to-moderate right-sided cord
flattening. Uncovertebral spurring and mild facet arthrosis result
in severe bilateral neural foraminal stenosis.

C6-7: Central disc protrusion without spinal stenosis. Uncovertebral
spurring results in mild bilateral neural foraminal stenosis.

C7-T1: Listhesis with disc uncovering and uncovertebral spurring
result in mild bilateral neural foraminal stenosis. There is
bilateral facet ankylosis. No spinal stenosis.
IMPRESSION: Diffuse cervical disc degeneration most notable at C5-6 where a disc
extrusion results in moderate spinal stenosis with mild to moderate
cord flattening. Severe bilateral neural foraminal stenosis at this
level.

## 2018-03-24 DIAGNOSIS — R262 Difficulty in walking, not elsewhere classified: Secondary | ICD-10-CM | POA: Diagnosis not present

## 2018-03-24 DIAGNOSIS — G9589 Other specified diseases of spinal cord: Secondary | ICD-10-CM | POA: Diagnosis not present

## 2018-03-24 DIAGNOSIS — L98499 Non-pressure chronic ulcer of skin of other sites with unspecified severity: Secondary | ICD-10-CM | POA: Diagnosis not present

## 2018-03-24 DIAGNOSIS — L89154 Pressure ulcer of sacral region, stage 4: Secondary | ICD-10-CM | POA: Diagnosis not present

## 2018-03-27 ENCOUNTER — Encounter: Payer: Self-pay | Admitting: Podiatry

## 2018-03-27 ENCOUNTER — Ambulatory Visit (INDEPENDENT_AMBULATORY_CARE_PROVIDER_SITE_OTHER): Payer: Medicare Other | Admitting: Podiatry

## 2018-03-27 ENCOUNTER — Telehealth: Payer: Self-pay | Admitting: *Deleted

## 2018-03-27 DIAGNOSIS — L98491 Non-pressure chronic ulcer of skin of other sites limited to breakdown of skin: Secondary | ICD-10-CM

## 2018-03-27 DIAGNOSIS — M79674 Pain in right toe(s): Secondary | ICD-10-CM | POA: Diagnosis not present

## 2018-03-27 DIAGNOSIS — M79675 Pain in left toe(s): Secondary | ICD-10-CM

## 2018-03-27 DIAGNOSIS — B351 Tinea unguium: Secondary | ICD-10-CM | POA: Diagnosis not present

## 2018-03-27 DIAGNOSIS — I739 Peripheral vascular disease, unspecified: Secondary | ICD-10-CM

## 2018-03-27 NOTE — Telephone Encounter (Signed)
Received Episode Summary Report for Review; forwarded to provider/SLS 10/04

## 2018-03-29 NOTE — Progress Notes (Signed)
Subjective: Katherine Owen presents to clinic today acompanied by her son. She is here for follow up of ulcers on the dorsal aspect of digits 2, 3, 4, 5.   Her son states she is still under care of the [facility] in [place] for wounds of her left posterior leg and left posterior heel.   She is also seen for elongated,  mycotic toenails relieved with periodic professional debridement. She states both great toes are sore from ingrown nails. She denies any drainage, redness or swelling.   Objective: Vascular Examination: Capillary refill time <3 seconds x 10 digits Dorsalis pedis pulses palpable bl Posterior tibial pulses faintly palpable b/l No digital hair x 10 digits Skin temperature warm to cool b/l  Dermatological Examination: Skin thin and atrophic b/l Toenails 1-5 b/l discolored, thick, dystrophic with subungual debris and pain with palpation to nailbeds due to thickness of nails. Incurvated nailplates b/l great toes b/l borders. No erythema, no edema, no drainage.  Noted on the dorsal aspect of digits 2, 3, 4, 5  Left foot are evidence of ulcerations with dried scabs. All digits are completely epithelialized after scabs removed. +Tenderness to palpation medial and lateral nail borders.  Musculoskeletal: Muscle strength 5/5 to all LE muscle groups  Neurological: Sensation intact with 10 gram monofilament b/l Vibratory sensation intact b/l  Assessment: 1. Painful onychomycosis toenails 1-5 b/l 2. Healed ulcerations digits 2, 3, 4, 5 left foot 3. Ingrown nails b/l great toes, noninfected 4. Peripheral arterial disease  Plan: 1. Toenails 1-5 b/l were debrided in length and girth without iatrogenic bleeding. Offending nail borders debrided and curretaged b/l great toes b/l borders. Triple antibiotic ointment applied. No further treatment required by facility. 2. Patient to continue tread socks daily 3. Recommended heel pillows to be worn when patient  is in bed at skilled nursing facility ; she is also to wear them when in bed when she is discharged home 4. Patient to report any pedal injuries to medical professional  5. Follow up 3 months.  6. Patient/POA to call should there be a concern in the interim.

## 2018-03-31 ENCOUNTER — Telehealth: Payer: Self-pay | Admitting: Family Medicine

## 2018-03-31 NOTE — Telephone Encounter (Signed)
Copied from [place] [id number]. Topic: Quick Communication - See Telephone Encounter >> Mar 31, 2018  3:03 PM Percell Belt A wrote: CRM for notification. See Telephone encounter for: 03/31/18.  [place] office called (dental office) they have some questions about pt meds before they can do dental procedure on this pt, please advise, they are needing a letter stating if pt need to pre medicate before Procedure  [phone] Fax number  [phone]

## 2018-04-02 NOTE — Telephone Encounter (Signed)
Do not see any contraindication in the chart to a teeth cleaning

## 2018-04-02 NOTE — Telephone Encounter (Signed)
The latest guidelines due not recommend antibiotics for dental work even if joint replacement histoy is noted

## 2018-04-02 NOTE — Telephone Encounter (Signed)
Patient is going to get her teeth cleaned tomorrow and is in a nursing home type.  They will just need a letter stating that its ok to do cleaning.  Can you review chart to see if everything checks out?

## 2018-04-02 NOTE — Telephone Encounter (Signed)
Dental office calling again, needing clarification. Pt. scheduled for teeth cleaning tomorrow, but d/t her history of bilateral hip replacement, and shoulder replacement, office needed to know if it is OK to give antibiotic, and if so, what to give? Please call [phone] ASAP to advise and fax any medication orders to #[phone].

## 2018-04-03 NOTE — Telephone Encounter (Signed)
Dental office notified, but need in writing.  Faxed over to dental office.

## 2018-04-03 NOTE — Telephone Encounter (Signed)
Dental office made aware and correspondence documented faxed to number given per their request for future procedures.

## 2018-04-23 DIAGNOSIS — R262 Difficulty in walking, not elsewhere classified: Secondary | ICD-10-CM | POA: Diagnosis not present

## 2018-04-23 DIAGNOSIS — I10 Essential (primary) hypertension: Secondary | ICD-10-CM | POA: Diagnosis not present

## 2018-04-23 DIAGNOSIS — L89154 Pressure ulcer of sacral region, stage 4: Secondary | ICD-10-CM | POA: Diagnosis not present

## 2018-04-23 DIAGNOSIS — G9589 Other specified diseases of spinal cord: Secondary | ICD-10-CM | POA: Diagnosis not present

## 2018-04-28 ENCOUNTER — Encounter

## 2018-04-28 ENCOUNTER — Ambulatory Visit (INDEPENDENT_AMBULATORY_CARE_PROVIDER_SITE_OTHER): Payer: Medicare Other | Admitting: Vascular Surgery

## 2018-04-28 ENCOUNTER — Encounter (INDEPENDENT_AMBULATORY_CARE_PROVIDER_SITE_OTHER): Payer: Medicare Other

## 2018-04-30 DIAGNOSIS — R143 Flatulence: Secondary | ICD-10-CM | POA: Diagnosis not present

## 2018-04-30 DIAGNOSIS — K5903 Drug induced constipation: Secondary | ICD-10-CM | POA: Diagnosis not present

## 2018-04-30 DIAGNOSIS — Z9229 Personal history of other drug therapy: Secondary | ICD-10-CM | POA: Diagnosis not present

## 2018-05-07 DIAGNOSIS — K5909 Other constipation: Secondary | ICD-10-CM | POA: Diagnosis not present

## 2018-05-07 DIAGNOSIS — R143 Flatulence: Secondary | ICD-10-CM | POA: Diagnosis not present

## 2018-05-12 DIAGNOSIS — R143 Flatulence: Secondary | ICD-10-CM | POA: Diagnosis not present

## 2018-05-12 DIAGNOSIS — K5909 Other constipation: Secondary | ICD-10-CM | POA: Diagnosis not present

## 2018-05-19 ENCOUNTER — Ambulatory Visit: Payer: Medicare Other | Admitting: Nurse Practitioner

## 2018-05-19 DIAGNOSIS — L98499 Non-pressure chronic ulcer of skin of other sites with unspecified severity: Secondary | ICD-10-CM | POA: Diagnosis not present

## 2018-05-19 DIAGNOSIS — S91302A Unspecified open wound, left foot, initial encounter: Secondary | ICD-10-CM | POA: Diagnosis not present

## 2018-05-19 DIAGNOSIS — I70209 Unspecified atherosclerosis of native arteries of extremities, unspecified extremity: Secondary | ICD-10-CM | POA: Diagnosis not present

## 2018-05-19 DIAGNOSIS — L89154 Pressure ulcer of sacral region, stage 4: Secondary | ICD-10-CM | POA: Diagnosis not present

## 2018-05-20 ENCOUNTER — Ambulatory Visit (INDEPENDENT_AMBULATORY_CARE_PROVIDER_SITE_OTHER): Payer: Medicare Other | Admitting: Gastroenterology

## 2018-05-20 ENCOUNTER — Encounter: Payer: Self-pay | Admitting: Gastroenterology

## 2018-05-20 VITALS — BP 134/82 | HR 96

## 2018-05-20 DIAGNOSIS — R110 Nausea: Secondary | ICD-10-CM

## 2018-05-20 DIAGNOSIS — R141 Gas pain: Secondary | ICD-10-CM | POA: Diagnosis not present

## 2018-05-20 DIAGNOSIS — I70299 Other atherosclerosis of native arteries of extremities, unspecified extremity: Secondary | ICD-10-CM

## 2018-05-20 DIAGNOSIS — R140 Abdominal distension (gaseous): Secondary | ICD-10-CM | POA: Diagnosis not present

## 2018-05-20 DIAGNOSIS — L97909 Non-pressure chronic ulcer of unspecified part of unspecified lower leg with unspecified severity: Secondary | ICD-10-CM

## 2018-05-20 MED ORDER — PANTOPRAZOLE SODIUM 40 MG PO TBEC: 40.0000 mg | DELAYED_RELEASE_TABLET | Freq: Two times a day (BID) | ORAL | 5 refills | Status: AC

## 2018-05-20 NOTE — Progress Notes (Signed)
05/27/2018 Katherine Owen [id number] [birth year]   HISTORY OF PRESENT ILLNESS: This is an 84 year old female who is a patient of Dr. Ardis Hughs.  She has been seen here by him on a couple of occasions with what he called dyspepsia.  She is here today with her son.  She tells me that in August she had angioplasty of her legs and has been in a nursing facility for rehab since then.  She comes here today with complaints of nausea especially in the morning with a lot of belching and flatulence.  No actual vomiting.  This feels similar as to when she was found to have H. pylori in the past, but seems to be worse.  Her son thinks that the symptoms began after the angioplasty, but she says they have been going on longer than that.  Does have history of H. pylori in the past which was treated with Pylera, but eradication was never confirmed with stool antigen.  She is on pantoprazole 40 mg daily each morning and taking simethicone tablets with meals.  In regards to her bowel habits she says that she feels her bowel movements are normal but she sometimes feels she is not completely emptying her bowels and she attributes this somewhat to being in bed on the bedpan rather than up on toilet to evacuate her stools.  She is actually being discharged from the nursing facility in 2 days so will be home with her family where she will be able to use toilet/bedside commode.   Past Medical History:  Diagnosis Date  . Allergy   . Arthritis   . Cataract    Bilateral  . Essential hypertension 05/02/2014  . GERD (gastroesophageal reflux disease)   . Headache   . History of shingles   . HLD (hyperlipidemia) 05/02/2014  . Hyperlipidemia   . Hypertension   . Neuropathic pain 05/02/2014  . Osteoarthritis of both shoulders due to rotator cuff injury 04/23/2016  . Poor mobility 08/28/2015  . Pressure ulcer   . 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 Spinal stenosis of lumbar region 05/02/2014   S/p  [facility]compression [facility] 2015    Past Surgical History:  Procedure Laterality Date  . ANKLE SURGERY Left 2014  . BUNIONECTOMY Right   . CATARACT EXTRACTION, BILATERAL    . CERVICAL DISC SURGERY Left   . COLONOSCOPY    . KNEE ARTHROSCOPY Bilateral   . LOWER EXTREMITY ANGIOGRAPHY Left 02/03/2018   Procedure: LOWER EXTREMITY ANGIOGRAPHY;  Surgeon: Katha Cabal, MD;  Location: [facility];  Service: Cardiovascular;  Laterality: Left;  . LOWER EXTREMITY ANGIOGRAPHY Right 02/10/2018   Procedure: LOWER EXTREMITY ANGIOGRAPHY;  Surgeon: Katha Cabal, MD;  Location: [facility];  Service: Cardiovascular;  Laterality: Right;  . REVERSE SHOULDER ARTHROPLASTY Right 01/11/2016   Procedure: REVERSE SHOULDER ARTHROPLASTY;  Surgeon: Justice Britain, MD;  Location: [facility];  Service: Orthopedics;  Laterality: Right;  . SPINE SURGERY     done at [facility] Apr 26, 2014, lumbar decompression  . TOTAL HIP ARTHROPLASTY Bilateral 2005, 2009   [place] , [place]    reports that she quit smoking about 51 years ago. Her smoking use included cigarettes. She has a 20.00 pack-year smoking history. She has never used smokeless tobacco. She reports that she does not drink alcohol or use drugs. family history includes Arthritis in her father and mother; Cancer in her brother; Colon cancer in her mother; Parkinson's disease in her father.  No Known Allergies    Outpatient Encounter Medications as of 05/20/2018  Medication Sig  . acetaminophen (TYLENOL) 325 MG tablet Take 650 mg by mouth every 4 (four) hours as needed.  Marland Kitchen atorvastatin (LIPITOR) 10 MG tablet Take 1 tablet (10 mg total) by mouth at bedtime. For high cholesterol  . celecoxib (CELEBREX) 200 MG capsule Take 1 capsule (200 mg total) by mouth daily.  . Cholecalciferol (VITAMIN D3) 5000 units CAPS Take 5,000 Units by mouth at bedtime.   . furosemide (LASIX) 40 MG tablet Take 1 tablet (40 mg total) by mouth daily.  Marland Kitchen gabapentin (NEURONTIN) 100  MG capsule Take 1 capsule (100 mg total) by mouth 3 (three) times daily.  Marland Kitchen guaifenesin (ROBITUSSIN) 100 MG/5ML syrup Take 200 mg by mouth every 4 (four) hours as needed for cough.  . losartan (COZAAR) 100 MG tablet Take 1 tablet (100 mg total) by mouth daily.  . Misc. Devices [facility]) MISC Use as directed  . montelukast (SINGULAIR) 10 MG tablet Take 1 tablet (10 mg total) by mouth at bedtime.  . Multiple Vitamins-Minerals (CERTAVITE/ANTIOXIDANTS PO) Take 1 tablet by mouth daily.  . NONFORMULARY OR COMPOUNDED ITEM Lift recliner #1  As directed  Dx spinal stenosis, osteoarthritis both shoulder ,  Low ext weakness  . Polyethylene Glycol 3350 (MIRALAX PO) Take 17 g by mouth daily as needed.  Marland Kitchen SANTYL ointment Apply 1 application topically daily.  Marland Kitchen senna-docusate (SENOKOT-S) 8.6-50 MG per tablet Take 1 tablet by mouth at bedtime.   . simethicone (MYLICON) 734 MG chewable tablet Chew 125 mg by mouth 3 (three) times daily after meals.  . traMADol (ULTRAM) 50 MG tablet Take 50 mg by mouth every 6 (six) hours as needed.   . [DISCONTINUED] pantoprazole (PROTONIX) 40 MG tablet Take 1 tablet (40 mg total) by mouth daily.  . pantoprazole (PROTONIX) 40 MG tablet Take 1 tablet (40 mg total) by mouth 2 (two) times daily.  . [DISCONTINUED] Multiple Vitamins-Minerals (CENTRUM SILVER) tablet Take 1 tablet by mouth daily.  . [DISCONTINUED] mupirocin ointment (BACTROBAN) 2 % Place 1 application into the nose as needed.   No facility-administered encounter medications on file as of 05/20/2018.     REVIEW OF SYSTEMS  : All other systems reviewed and negative except where noted in the History of Present Illness.   PHYSICAL EXAM: BP 134/82   Pulse 96  General: Well developed black female in no acute distress; in wheelchair Head: Normocephalic and atraumatic Eyes:  Sclerae anicteric, conjunctiva pink. Ears: Normal auditory acuity Lungs: Clear throughout to auscultation; no increased WOB. Heart: Regular  rate and rhythm; no M/R/G. Abdomen: Soft, non-distended. Musculoskeletal: Symmetrical with no gross deformities  Skin: No lesions on visible extremities Extremities: No edema  Neurological: Alert oriented x 4, grossly non-focal Psychological:  Alert and cooperative. Normal mood and affect  ASSESSMENT AND PLAN: *84 year old female has been seen here previously with what has been called dyspepsia.  Now complaining of a.m. nausea with belching.  Has history of Hpylori treated in the past but eradication never confirmed.  She will discontinue her PPI for 2 weeks and will obtain Hpylori stool Ag.  Can take pepcid up to 40 mg BID if needed while off of PPI.  Once stool study obtained then she can resume her pantoprazole and will restart at 40 mg BID to see if that helps symptoms any further.   CC:  Ann Held, *

## 2018-05-20 NOTE — Patient Instructions (Addendum)
DISCONTINUE protonix for 2 weeks before stool sample is collected  We have sent in a new prescription of Pantoprazole for twice daily today , you will resume that after you complete your stool studies  If you are age 82 or older, your body mass index should be between 23-30. Your There is no height or weight on file to calculate BMI. If this is out of the aforementioned range listed, please consider follow up with your Primary Care Provider.  If you are age 49 or younger, your body mass index should be between 19-25. Your There is no height or weight on file to calculate BMI. If this is out of the aformentioned range listed, please consider follow up with your Primary Care Provider.

## 2018-05-22 DIAGNOSIS — L97821 Non-pressure chronic ulcer of other part of left lower leg limited to breakdown of skin: Secondary | ICD-10-CM | POA: Diagnosis not present

## 2018-05-22 DIAGNOSIS — I872 Venous insufficiency (chronic) (peripheral): Secondary | ICD-10-CM | POA: Diagnosis not present

## 2018-05-22 DIAGNOSIS — L89153 Pressure ulcer of sacral region, stage 3: Secondary | ICD-10-CM | POA: Diagnosis not present

## 2018-05-22 DIAGNOSIS — L89621 Pressure ulcer of left heel, stage 1: Secondary | ICD-10-CM | POA: Diagnosis not present

## 2018-05-22 DIAGNOSIS — L97811 Non-pressure chronic ulcer of other part of right lower leg limited to breakdown of skin: Secondary | ICD-10-CM | POA: Diagnosis not present

## 2018-05-22 DIAGNOSIS — I739 Peripheral vascular disease, unspecified: Secondary | ICD-10-CM | POA: Diagnosis not present

## 2018-05-23 DIAGNOSIS — L97811 Non-pressure chronic ulcer of other part of right lower leg limited to breakdown of skin: Secondary | ICD-10-CM | POA: Diagnosis not present

## 2018-05-23 DIAGNOSIS — L89621 Pressure ulcer of left heel, stage 1: Secondary | ICD-10-CM | POA: Diagnosis not present

## 2018-05-23 DIAGNOSIS — L97821 Non-pressure chronic ulcer of other part of left lower leg limited to breakdown of skin: Secondary | ICD-10-CM | POA: Diagnosis not present

## 2018-05-23 DIAGNOSIS — L89153 Pressure ulcer of sacral region, stage 3: Secondary | ICD-10-CM | POA: Diagnosis not present

## 2018-05-23 DIAGNOSIS — I739 Peripheral vascular disease, unspecified: Secondary | ICD-10-CM | POA: Diagnosis not present

## 2018-05-23 DIAGNOSIS — I872 Venous insufficiency (chronic) (peripheral): Secondary | ICD-10-CM | POA: Diagnosis not present

## 2018-05-25 ENCOUNTER — Telehealth: Payer: Self-pay | Admitting: Family Medicine

## 2018-05-25 DIAGNOSIS — L97821 Non-pressure chronic ulcer of other part of left lower leg limited to breakdown of skin: Secondary | ICD-10-CM | POA: Diagnosis not present

## 2018-05-25 DIAGNOSIS — I872 Venous insufficiency (chronic) (peripheral): Secondary | ICD-10-CM | POA: Diagnosis not present

## 2018-05-25 DIAGNOSIS — I739 Peripheral vascular disease, unspecified: Secondary | ICD-10-CM | POA: Diagnosis not present

## 2018-05-25 DIAGNOSIS — L97811 Non-pressure chronic ulcer of other part of right lower leg limited to breakdown of skin: Secondary | ICD-10-CM | POA: Diagnosis not present

## 2018-05-25 DIAGNOSIS — L89621 Pressure ulcer of left heel, stage 1: Secondary | ICD-10-CM | POA: Diagnosis not present

## 2018-05-25 DIAGNOSIS — L89153 Pressure ulcer of sacral region, stage 3: Secondary | ICD-10-CM | POA: Diagnosis not present

## 2018-05-25 NOTE — Telephone Encounter (Incomplete)
Copied from [place] [id number]. Topic: Quick Communication - Home Health Verbal Orders >> May 25, 2018  8:40 AM Cecelia Byars, NT wrote: Caller/Agency: Herm[facility] Staggers  Callback Number: [phone]   ok to leave a message  Requesting PT/ Frequency: * 1 week 1 2 week 3 effective   May 23, 2018

## 2018-05-26 ENCOUNTER — Telehealth: Payer: Self-pay | Admitting: Family Medicine

## 2018-05-26 NOTE — Telephone Encounter (Signed)
Please advise 

## 2018-05-26 NOTE — Telephone Encounter (Signed)
Ok to give verbal 

## 2018-05-26 NOTE — Telephone Encounter (Signed)
Copied from [place] [phone]. Topic: General - Other >> May 26, 2018  3:29 PM Yvette Rack wrote: Reason for CRM: Nurse Lorriane Shire from [facility] [phone] calling to see if Dr Archie Endo will sign orders for pt to get wound care pt has bad wounds they went to go see pt on Friday when she came home

## 2018-05-27 ENCOUNTER — Encounter: Payer: Self-pay | Admitting: Gastroenterology

## 2018-05-27 DIAGNOSIS — L89621 Pressure ulcer of left heel, stage 1: Secondary | ICD-10-CM | POA: Diagnosis not present

## 2018-05-27 DIAGNOSIS — L89153 Pressure ulcer of sacral region, stage 3: Secondary | ICD-10-CM | POA: Diagnosis not present

## 2018-05-27 DIAGNOSIS — L97811 Non-pressure chronic ulcer of other part of right lower leg limited to breakdown of skin: Secondary | ICD-10-CM | POA: Diagnosis not present

## 2018-05-27 DIAGNOSIS — R140 Abdominal distension (gaseous): Secondary | ICD-10-CM | POA: Insufficient documentation

## 2018-05-27 DIAGNOSIS — I872 Venous insufficiency (chronic) (peripheral): Secondary | ICD-10-CM | POA: Diagnosis not present

## 2018-05-27 DIAGNOSIS — R141 Gas pain: Secondary | ICD-10-CM | POA: Insufficient documentation

## 2018-05-27 DIAGNOSIS — L97821 Non-pressure chronic ulcer of other part of left lower leg limited to breakdown of skin: Secondary | ICD-10-CM | POA: Diagnosis not present

## 2018-05-27 DIAGNOSIS — I739 Peripheral vascular disease, unspecified: Secondary | ICD-10-CM | POA: Diagnosis not present

## 2018-05-27 DIAGNOSIS — R110 Nausea: Secondary | ICD-10-CM | POA: Insufficient documentation

## 2018-05-27 NOTE — Progress Notes (Signed)
I agree the above note, plan

## 2018-05-28 NOTE — Telephone Encounter (Signed)
Katherine Owen that Dr. Etter Sjogren will sign orders

## 2018-05-28 NOTE — Telephone Encounter (Signed)
Left verbal orders on machine.

## 2018-05-29 DIAGNOSIS — L97821 Non-pressure chronic ulcer of other part of left lower leg limited to breakdown of skin: Secondary | ICD-10-CM | POA: Diagnosis not present

## 2018-05-29 DIAGNOSIS — L89153 Pressure ulcer of sacral region, stage 3: Secondary | ICD-10-CM | POA: Diagnosis not present

## 2018-05-29 DIAGNOSIS — I739 Peripheral vascular disease, unspecified: Secondary | ICD-10-CM | POA: Diagnosis not present

## 2018-05-29 DIAGNOSIS — L97811 Non-pressure chronic ulcer of other part of right lower leg limited to breakdown of skin: Secondary | ICD-10-CM | POA: Diagnosis not present

## 2018-05-29 DIAGNOSIS — I872 Venous insufficiency (chronic) (peripheral): Secondary | ICD-10-CM | POA: Diagnosis not present

## 2018-05-29 DIAGNOSIS — L89621 Pressure ulcer of left heel, stage 1: Secondary | ICD-10-CM | POA: Diagnosis not present

## 2018-05-30 DIAGNOSIS — I739 Peripheral vascular disease, unspecified: Secondary | ICD-10-CM | POA: Diagnosis not present

## 2018-05-30 DIAGNOSIS — L89621 Pressure ulcer of left heel, stage 1: Secondary | ICD-10-CM | POA: Diagnosis not present

## 2018-05-30 DIAGNOSIS — L89153 Pressure ulcer of sacral region, stage 3: Secondary | ICD-10-CM | POA: Diagnosis not present

## 2018-05-30 DIAGNOSIS — L97821 Non-pressure chronic ulcer of other part of left lower leg limited to breakdown of skin: Secondary | ICD-10-CM | POA: Diagnosis not present

## 2018-05-30 DIAGNOSIS — I872 Venous insufficiency (chronic) (peripheral): Secondary | ICD-10-CM | POA: Diagnosis not present

## 2018-05-30 DIAGNOSIS — L97811 Non-pressure chronic ulcer of other part of right lower leg limited to breakdown of skin: Secondary | ICD-10-CM | POA: Diagnosis not present

## 2018-06-01 ENCOUNTER — Telehealth: Payer: Self-pay | Admitting: Family Medicine

## 2018-06-01 DIAGNOSIS — L89153 Pressure ulcer of sacral region, stage 3: Secondary | ICD-10-CM | POA: Diagnosis not present

## 2018-06-01 DIAGNOSIS — I872 Venous insufficiency (chronic) (peripheral): Secondary | ICD-10-CM | POA: Diagnosis not present

## 2018-06-01 DIAGNOSIS — I739 Peripheral vascular disease, unspecified: Secondary | ICD-10-CM | POA: Diagnosis not present

## 2018-06-01 DIAGNOSIS — L97821 Non-pressure chronic ulcer of other part of left lower leg limited to breakdown of skin: Secondary | ICD-10-CM | POA: Diagnosis not present

## 2018-06-01 DIAGNOSIS — L97811 Non-pressure chronic ulcer of other part of right lower leg limited to breakdown of skin: Secondary | ICD-10-CM | POA: Diagnosis not present

## 2018-06-01 DIAGNOSIS — L89621 Pressure ulcer of left heel, stage 1: Secondary | ICD-10-CM | POA: Diagnosis not present

## 2018-06-01 NOTE — Telephone Encounter (Signed)
Copied from [place] [phone]. Topic: Quick Communication - See Telephone Encounter >> Jun 01, 2018  4:56 PM Luciana Axe wrote: Colletta Maryland from [facility] is calling to request a mobile chest x-ray. And the family is requesting [facility] to be brought in. Colletta Maryland [phone]

## 2018-06-01 NOTE — Telephone Encounter (Signed)
Copied from [place] [id number]. Topic: Quick Communication - Home Health Verbal Orders >> Jun 01, 2018 10:52 AM Nils Flack wrote: Caller/Agency: [facility] Number: [phone] Requesting OT/PT/Skilled Nursing/Social Work: Wound care  Frequency: stephanie wouldl like call back to discuss treatment options about wounda care for pt.  She is seeing pt at 2 pm today.

## 2018-06-02 ENCOUNTER — Telehealth: Payer: Self-pay | Admitting: Family Medicine

## 2018-06-02 DIAGNOSIS — I739 Peripheral vascular disease, unspecified: Secondary | ICD-10-CM | POA: Diagnosis not present

## 2018-06-02 DIAGNOSIS — R5381 Other malaise: Secondary | ICD-10-CM

## 2018-06-02 DIAGNOSIS — I872 Venous insufficiency (chronic) (peripheral): Secondary | ICD-10-CM | POA: Diagnosis not present

## 2018-06-02 DIAGNOSIS — R600 Localized edema: Secondary | ICD-10-CM

## 2018-06-02 DIAGNOSIS — L89153 Pressure ulcer of sacral region, stage 3: Secondary | ICD-10-CM | POA: Diagnosis not present

## 2018-06-02 DIAGNOSIS — I10 Essential (primary) hypertension: Secondary | ICD-10-CM

## 2018-06-02 DIAGNOSIS — L89621 Pressure ulcer of left heel, stage 1: Secondary | ICD-10-CM | POA: Diagnosis not present

## 2018-06-02 DIAGNOSIS — E785 Hyperlipidemia, unspecified: Secondary | ICD-10-CM

## 2018-06-02 DIAGNOSIS — Z7409 Other reduced mobility: Secondary | ICD-10-CM

## 2018-06-02 DIAGNOSIS — T07XXXA Unspecified multiple injuries, initial encounter: Secondary | ICD-10-CM

## 2018-06-02 DIAGNOSIS — L97811 Non-pressure chronic ulcer of other part of right lower leg limited to breakdown of skin: Secondary | ICD-10-CM | POA: Diagnosis not present

## 2018-06-02 DIAGNOSIS — L97821 Non-pressure chronic ulcer of other part of left lower leg limited to breakdown of skin: Secondary | ICD-10-CM | POA: Diagnosis not present

## 2018-06-02 NOTE — Telephone Encounter (Signed)
yes

## 2018-06-02 NOTE — Telephone Encounter (Signed)
Katherine Owen with [facility] calling to check status on getting order for mobile chest xray.

## 2018-06-02 NOTE — Telephone Encounter (Signed)
Are you ok with this? 

## 2018-06-02 NOTE — Telephone Encounter (Signed)
Copied from [place] [id number]. Topic: Referral - Request for Referral >> Jun 02, 2018  9:15 AM Margot Ables wrote: Caller/Agency: Manus Gunning w/[facility] Number: [phone] Fax: [phone] Requesting: Hospice Referral Frequency: family is requesting hospice referral - non healing wounds and bed bound status, difficult on the family

## 2018-06-03 ENCOUNTER — Telehealth: Payer: Self-pay | Admitting: Family Medicine

## 2018-06-03 DIAGNOSIS — L89153 Pressure ulcer of sacral region, stage 3: Secondary | ICD-10-CM | POA: Diagnosis not present

## 2018-06-03 DIAGNOSIS — I739 Peripheral vascular disease, unspecified: Secondary | ICD-10-CM | POA: Diagnosis not present

## 2018-06-03 DIAGNOSIS — L97821 Non-pressure chronic ulcer of other part of left lower leg limited to breakdown of skin: Secondary | ICD-10-CM | POA: Diagnosis not present

## 2018-06-03 DIAGNOSIS — L97811 Non-pressure chronic ulcer of other part of right lower leg limited to breakdown of skin: Secondary | ICD-10-CM | POA: Diagnosis not present

## 2018-06-03 DIAGNOSIS — I872 Venous insufficiency (chronic) (peripheral): Secondary | ICD-10-CM | POA: Diagnosis not present

## 2018-06-03 DIAGNOSIS — L89621 Pressure ulcer of left heel, stage 1: Secondary | ICD-10-CM | POA: Diagnosis not present

## 2018-06-03 NOTE — Telephone Encounter (Signed)
Copied from [place] [id number]. Topic: Quick Communication - See Telephone Encounter >> Jun 03, 2018 12:22 PM Adelene Idler wrote: Patient son  Cloria Spring called in and stated patient is currently bed bound and wants Dr Carollee Herter to reach for a discussion on plan of care  CB# [phone]

## 2018-06-04 ENCOUNTER — Other Ambulatory Visit: Payer: Medicare Other

## 2018-06-04 DIAGNOSIS — R110 Nausea: Secondary | ICD-10-CM

## 2018-06-04 DIAGNOSIS — R140 Abdominal distension (gaseous): Secondary | ICD-10-CM

## 2018-06-04 DIAGNOSIS — R0602 Shortness of breath: Secondary | ICD-10-CM | POA: Diagnosis not present

## 2018-06-04 DIAGNOSIS — L97821 Non-pressure chronic ulcer of other part of left lower leg limited to breakdown of skin: Secondary | ICD-10-CM | POA: Diagnosis not present

## 2018-06-04 DIAGNOSIS — R141 Gas pain: Secondary | ICD-10-CM

## 2018-06-04 DIAGNOSIS — I739 Peripheral vascular disease, unspecified: Secondary | ICD-10-CM | POA: Diagnosis not present

## 2018-06-04 DIAGNOSIS — L89153 Pressure ulcer of sacral region, stage 3: Secondary | ICD-10-CM | POA: Diagnosis not present

## 2018-06-04 DIAGNOSIS — R0989 Other specified symptoms and signs involving the circulatory and respiratory systems: Secondary | ICD-10-CM | POA: Diagnosis not present

## 2018-06-04 DIAGNOSIS — L89621 Pressure ulcer of left heel, stage 1: Secondary | ICD-10-CM | POA: Diagnosis not present

## 2018-06-04 DIAGNOSIS — L97811 Non-pressure chronic ulcer of other part of right lower leg limited to breakdown of skin: Secondary | ICD-10-CM | POA: Diagnosis not present

## 2018-06-04 DIAGNOSIS — I872 Venous insufficiency (chronic) (peripheral): Secondary | ICD-10-CM | POA: Diagnosis not present

## 2018-06-04 NOTE — Telephone Encounter (Signed)
Orders given for packing.  She stated that family is not moving patient as they should and now patient has developed a bad wound that has tunneled.

## 2018-06-04 NOTE — Telephone Encounter (Signed)
Order given to do chest xray.  Stepahanie stated that patient has some decreased breath sounds.

## 2018-06-04 NOTE — Telephone Encounter (Signed)
Referral placed.

## 2018-06-05 ENCOUNTER — Inpatient Hospital Stay: Payer: Medicare Other | Admitting: Family Medicine

## 2018-06-05 DIAGNOSIS — L89621 Pressure ulcer of left heel, stage 1: Secondary | ICD-10-CM | POA: Diagnosis not present

## 2018-06-05 DIAGNOSIS — L97821 Non-pressure chronic ulcer of other part of left lower leg limited to breakdown of skin: Secondary | ICD-10-CM | POA: Diagnosis not present

## 2018-06-05 DIAGNOSIS — I739 Peripheral vascular disease, unspecified: Secondary | ICD-10-CM | POA: Diagnosis not present

## 2018-06-05 DIAGNOSIS — L89153 Pressure ulcer of sacral region, stage 3: Secondary | ICD-10-CM | POA: Diagnosis not present

## 2018-06-05 DIAGNOSIS — I872 Venous insufficiency (chronic) (peripheral): Secondary | ICD-10-CM | POA: Diagnosis not present

## 2018-06-05 DIAGNOSIS — L97811 Non-pressure chronic ulcer of other part of right lower leg limited to breakdown of skin: Secondary | ICD-10-CM | POA: Diagnosis not present

## 2018-06-06 LAB — HELICOBACTER PYLORI  SPECIAL ANTIGEN
MICRO NUMBER:: 91492675
SPECIMEN QUALITY: ADEQUATE

## 2018-06-08 ENCOUNTER — Inpatient Hospital Stay: Payer: Medicare Other | Admitting: Family Medicine

## 2018-06-08 DIAGNOSIS — L97811 Non-pressure chronic ulcer of other part of right lower leg limited to breakdown of skin: Secondary | ICD-10-CM | POA: Diagnosis not present

## 2018-06-08 DIAGNOSIS — I872 Venous insufficiency (chronic) (peripheral): Secondary | ICD-10-CM | POA: Diagnosis not present

## 2018-06-08 DIAGNOSIS — L97821 Non-pressure chronic ulcer of other part of left lower leg limited to breakdown of skin: Secondary | ICD-10-CM | POA: Diagnosis not present

## 2018-06-08 DIAGNOSIS — L89621 Pressure ulcer of left heel, stage 1: Secondary | ICD-10-CM | POA: Diagnosis not present

## 2018-06-08 DIAGNOSIS — L89153 Pressure ulcer of sacral region, stage 3: Secondary | ICD-10-CM | POA: Diagnosis not present

## 2018-06-08 DIAGNOSIS — I739 Peripheral vascular disease, unspecified: Secondary | ICD-10-CM | POA: Diagnosis not present

## 2018-06-09 DIAGNOSIS — L89153 Pressure ulcer of sacral region, stage 3: Secondary | ICD-10-CM | POA: Diagnosis not present

## 2018-06-09 DIAGNOSIS — I872 Venous insufficiency (chronic) (peripheral): Secondary | ICD-10-CM | POA: Diagnosis not present

## 2018-06-09 DIAGNOSIS — R600 Localized edema: Secondary | ICD-10-CM | POA: Diagnosis not present

## 2018-06-09 DIAGNOSIS — R5381 Other malaise: Secondary | ICD-10-CM | POA: Diagnosis not present

## 2018-06-09 DIAGNOSIS — I70299 Other atherosclerosis of native arteries of extremities, unspecified extremity: Secondary | ICD-10-CM | POA: Diagnosis not present

## 2018-06-09 DIAGNOSIS — R634 Abnormal weight loss: Secondary | ICD-10-CM | POA: Diagnosis not present

## 2018-06-09 DIAGNOSIS — I890 Lymphedema, not elsewhere classified: Secondary | ICD-10-CM | POA: Diagnosis not present

## 2018-06-09 DIAGNOSIS — I10 Essential (primary) hypertension: Secondary | ICD-10-CM | POA: Diagnosis not present

## 2018-06-09 DIAGNOSIS — L97819 Non-pressure chronic ulcer of other part of right lower leg with unspecified severity: Secondary | ICD-10-CM | POA: Diagnosis not present

## 2018-06-09 NOTE — Telephone Encounter (Signed)
Spoke with son Cheral Bay and he stated that hospice has contacted them and they were meeting with them at 10am this morning.

## 2018-06-10 ENCOUNTER — Ambulatory Visit (INDEPENDENT_AMBULATORY_CARE_PROVIDER_SITE_OTHER): Payer: Medicare Other | Admitting: Nurse Practitioner

## 2018-06-10 ENCOUNTER — Telehealth: Payer: Self-pay | Admitting: *Deleted

## 2018-06-10 ENCOUNTER — Encounter (INDEPENDENT_AMBULATORY_CARE_PROVIDER_SITE_OTHER): Payer: Medicare Other

## 2018-06-10 DIAGNOSIS — L97819 Non-pressure chronic ulcer of other part of right lower leg with unspecified severity: Secondary | ICD-10-CM | POA: Diagnosis not present

## 2018-06-10 DIAGNOSIS — L89153 Pressure ulcer of sacral region, stage 3: Secondary | ICD-10-CM | POA: Diagnosis not present

## 2018-06-10 DIAGNOSIS — I70299 Other atherosclerosis of native arteries of extremities, unspecified extremity: Secondary | ICD-10-CM | POA: Diagnosis not present

## 2018-06-10 DIAGNOSIS — I890 Lymphedema, not elsewhere classified: Secondary | ICD-10-CM | POA: Diagnosis not present

## 2018-06-10 DIAGNOSIS — I872 Venous insufficiency (chronic) (peripheral): Secondary | ICD-10-CM | POA: Diagnosis not present

## 2018-06-10 DIAGNOSIS — R634 Abnormal weight loss: Secondary | ICD-10-CM | POA: Diagnosis not present

## 2018-06-10 NOTE — Telephone Encounter (Signed)
Received Physician Orders from [facility]; forwarded to provider/SLS 12/18

## 2018-06-10 NOTE — Telephone Encounter (Signed)
Received Imaging Results from [facility]; forwarded to provider/SLS 12/18

## 2018-06-12 DIAGNOSIS — L97819 Non-pressure chronic ulcer of other part of right lower leg with unspecified severity: Secondary | ICD-10-CM | POA: Diagnosis not present

## 2018-06-12 DIAGNOSIS — R634 Abnormal weight loss: Secondary | ICD-10-CM | POA: Diagnosis not present

## 2018-06-12 DIAGNOSIS — I872 Venous insufficiency (chronic) (peripheral): Secondary | ICD-10-CM | POA: Diagnosis not present

## 2018-06-12 DIAGNOSIS — I890 Lymphedema, not elsewhere classified: Secondary | ICD-10-CM | POA: Diagnosis not present

## 2018-06-12 DIAGNOSIS — L89153 Pressure ulcer of sacral region, stage 3: Secondary | ICD-10-CM | POA: Diagnosis not present

## 2018-06-12 DIAGNOSIS — I70299 Other atherosclerosis of native arteries of extremities, unspecified extremity: Secondary | ICD-10-CM | POA: Diagnosis not present

## 2018-06-13 DIAGNOSIS — I890 Lymphedema, not elsewhere classified: Secondary | ICD-10-CM | POA: Diagnosis not present

## 2018-06-13 DIAGNOSIS — I872 Venous insufficiency (chronic) (peripheral): Secondary | ICD-10-CM | POA: Diagnosis not present

## 2018-06-13 DIAGNOSIS — L97819 Non-pressure chronic ulcer of other part of right lower leg with unspecified severity: Secondary | ICD-10-CM | POA: Diagnosis not present

## 2018-06-13 DIAGNOSIS — I70299 Other atherosclerosis of native arteries of extremities, unspecified extremity: Secondary | ICD-10-CM | POA: Diagnosis not present

## 2018-06-13 DIAGNOSIS — L89153 Pressure ulcer of sacral region, stage 3: Secondary | ICD-10-CM | POA: Diagnosis not present

## 2018-06-13 DIAGNOSIS — R634 Abnormal weight loss: Secondary | ICD-10-CM | POA: Diagnosis not present

## 2018-06-14 DIAGNOSIS — R634 Abnormal weight loss: Secondary | ICD-10-CM | POA: Diagnosis not present

## 2018-06-14 DIAGNOSIS — I70299 Other atherosclerosis of native arteries of extremities, unspecified extremity: Secondary | ICD-10-CM | POA: Diagnosis not present

## 2018-06-14 DIAGNOSIS — I872 Venous insufficiency (chronic) (peripheral): Secondary | ICD-10-CM | POA: Diagnosis not present

## 2018-06-14 DIAGNOSIS — I890 Lymphedema, not elsewhere classified: Secondary | ICD-10-CM | POA: Diagnosis not present

## 2018-06-14 DIAGNOSIS — L97819 Non-pressure chronic ulcer of other part of right lower leg with unspecified severity: Secondary | ICD-10-CM | POA: Diagnosis not present

## 2018-06-14 DIAGNOSIS — L89153 Pressure ulcer of sacral region, stage 3: Secondary | ICD-10-CM | POA: Diagnosis not present

## 2018-06-15 DIAGNOSIS — L89153 Pressure ulcer of sacral region, stage 3: Secondary | ICD-10-CM | POA: Diagnosis not present

## 2018-06-15 DIAGNOSIS — I70299 Other atherosclerosis of native arteries of extremities, unspecified extremity: Secondary | ICD-10-CM | POA: Diagnosis not present

## 2018-06-15 DIAGNOSIS — L97819 Non-pressure chronic ulcer of other part of right lower leg with unspecified severity: Secondary | ICD-10-CM | POA: Diagnosis not present

## 2018-06-15 DIAGNOSIS — I890 Lymphedema, not elsewhere classified: Secondary | ICD-10-CM | POA: Diagnosis not present

## 2018-06-15 DIAGNOSIS — I872 Venous insufficiency (chronic) (peripheral): Secondary | ICD-10-CM | POA: Diagnosis not present

## 2018-06-15 DIAGNOSIS — R634 Abnormal weight loss: Secondary | ICD-10-CM | POA: Diagnosis not present

## 2018-06-16 DIAGNOSIS — I70299 Other atherosclerosis of native arteries of extremities, unspecified extremity: Secondary | ICD-10-CM | POA: Diagnosis not present

## 2018-06-16 DIAGNOSIS — I890 Lymphedema, not elsewhere classified: Secondary | ICD-10-CM | POA: Diagnosis not present

## 2018-06-16 DIAGNOSIS — I872 Venous insufficiency (chronic) (peripheral): Secondary | ICD-10-CM | POA: Diagnosis not present

## 2018-06-16 DIAGNOSIS — L97819 Non-pressure chronic ulcer of other part of right lower leg with unspecified severity: Secondary | ICD-10-CM | POA: Diagnosis not present

## 2018-06-16 DIAGNOSIS — R634 Abnormal weight loss: Secondary | ICD-10-CM | POA: Diagnosis not present

## 2018-06-16 DIAGNOSIS — L89153 Pressure ulcer of sacral region, stage 3: Secondary | ICD-10-CM | POA: Diagnosis not present

## 2018-06-17 DIAGNOSIS — L97819 Non-pressure chronic ulcer of other part of right lower leg with unspecified severity: Secondary | ICD-10-CM | POA: Diagnosis not present

## 2018-06-17 DIAGNOSIS — L89153 Pressure ulcer of sacral region, stage 3: Secondary | ICD-10-CM | POA: Diagnosis not present

## 2018-06-17 DIAGNOSIS — I70299 Other atherosclerosis of native arteries of extremities, unspecified extremity: Secondary | ICD-10-CM | POA: Diagnosis not present

## 2018-06-17 DIAGNOSIS — I872 Venous insufficiency (chronic) (peripheral): Secondary | ICD-10-CM | POA: Diagnosis not present

## 2018-06-17 DIAGNOSIS — R634 Abnormal weight loss: Secondary | ICD-10-CM | POA: Diagnosis not present

## 2018-06-17 DIAGNOSIS — I890 Lymphedema, not elsewhere classified: Secondary | ICD-10-CM | POA: Diagnosis not present

## 2018-06-18 DIAGNOSIS — R634 Abnormal weight loss: Secondary | ICD-10-CM | POA: Diagnosis not present

## 2018-06-18 DIAGNOSIS — I872 Venous insufficiency (chronic) (peripheral): Secondary | ICD-10-CM | POA: Diagnosis not present

## 2018-06-18 DIAGNOSIS — L97819 Non-pressure chronic ulcer of other part of right lower leg with unspecified severity: Secondary | ICD-10-CM | POA: Diagnosis not present

## 2018-06-18 DIAGNOSIS — I890 Lymphedema, not elsewhere classified: Secondary | ICD-10-CM | POA: Diagnosis not present

## 2018-06-18 DIAGNOSIS — I70299 Other atherosclerosis of native arteries of extremities, unspecified extremity: Secondary | ICD-10-CM | POA: Diagnosis not present

## 2018-06-18 DIAGNOSIS — L89153 Pressure ulcer of sacral region, stage 3: Secondary | ICD-10-CM | POA: Diagnosis not present

## 2018-06-19 DIAGNOSIS — I872 Venous insufficiency (chronic) (peripheral): Secondary | ICD-10-CM | POA: Diagnosis not present

## 2018-06-19 DIAGNOSIS — L97819 Non-pressure chronic ulcer of other part of right lower leg with unspecified severity: Secondary | ICD-10-CM | POA: Diagnosis not present

## 2018-06-19 DIAGNOSIS — I890 Lymphedema, not elsewhere classified: Secondary | ICD-10-CM | POA: Diagnosis not present

## 2018-06-19 DIAGNOSIS — I70299 Other atherosclerosis of native arteries of extremities, unspecified extremity: Secondary | ICD-10-CM | POA: Diagnosis not present

## 2018-06-19 DIAGNOSIS — L89153 Pressure ulcer of sacral region, stage 3: Secondary | ICD-10-CM | POA: Diagnosis not present

## 2018-06-19 DIAGNOSIS — R634 Abnormal weight loss: Secondary | ICD-10-CM | POA: Diagnosis not present

## 2018-06-20 DIAGNOSIS — I890 Lymphedema, not elsewhere classified: Secondary | ICD-10-CM | POA: Diagnosis not present

## 2018-06-20 DIAGNOSIS — L97819 Non-pressure chronic ulcer of other part of right lower leg with unspecified severity: Secondary | ICD-10-CM | POA: Diagnosis not present

## 2018-06-20 DIAGNOSIS — I70299 Other atherosclerosis of native arteries of extremities, unspecified extremity: Secondary | ICD-10-CM | POA: Diagnosis not present

## 2018-06-20 DIAGNOSIS — I872 Venous insufficiency (chronic) (peripheral): Secondary | ICD-10-CM | POA: Diagnosis not present

## 2018-06-20 DIAGNOSIS — R634 Abnormal weight loss: Secondary | ICD-10-CM | POA: Diagnosis not present

## 2018-06-20 DIAGNOSIS — L89153 Pressure ulcer of sacral region, stage 3: Secondary | ICD-10-CM | POA: Diagnosis not present

## 2018-06-21 DIAGNOSIS — I890 Lymphedema, not elsewhere classified: Secondary | ICD-10-CM | POA: Diagnosis not present

## 2018-06-21 DIAGNOSIS — I872 Venous insufficiency (chronic) (peripheral): Secondary | ICD-10-CM | POA: Diagnosis not present

## 2018-06-21 DIAGNOSIS — I70299 Other atherosclerosis of native arteries of extremities, unspecified extremity: Secondary | ICD-10-CM | POA: Diagnosis not present

## 2018-06-21 DIAGNOSIS — R634 Abnormal weight loss: Secondary | ICD-10-CM | POA: Diagnosis not present

## 2018-06-21 DIAGNOSIS — L89153 Pressure ulcer of sacral region, stage 3: Secondary | ICD-10-CM | POA: Diagnosis not present

## 2018-06-21 DIAGNOSIS — L97819 Non-pressure chronic ulcer of other part of right lower leg with unspecified severity: Secondary | ICD-10-CM | POA: Diagnosis not present

## 2018-06-22 DIAGNOSIS — I70299 Other atherosclerosis of native arteries of extremities, unspecified extremity: Secondary | ICD-10-CM | POA: Diagnosis not present

## 2018-06-22 DIAGNOSIS — L97819 Non-pressure chronic ulcer of other part of right lower leg with unspecified severity: Secondary | ICD-10-CM | POA: Diagnosis not present

## 2018-06-22 DIAGNOSIS — I872 Venous insufficiency (chronic) (peripheral): Secondary | ICD-10-CM | POA: Diagnosis not present

## 2018-06-22 DIAGNOSIS — L89153 Pressure ulcer of sacral region, stage 3: Secondary | ICD-10-CM | POA: Diagnosis not present

## 2018-06-22 DIAGNOSIS — R634 Abnormal weight loss: Secondary | ICD-10-CM | POA: Diagnosis not present

## 2018-06-22 DIAGNOSIS — I890 Lymphedema, not elsewhere classified: Secondary | ICD-10-CM | POA: Diagnosis not present

## 2018-06-24 DIAGNOSIS — I70299 Other atherosclerosis of native arteries of extremities, unspecified extremity: Secondary | ICD-10-CM | POA: Diagnosis not present

## 2018-06-24 DIAGNOSIS — I872 Venous insufficiency (chronic) (peripheral): Secondary | ICD-10-CM | POA: Diagnosis not present

## 2018-06-24 DIAGNOSIS — R634 Abnormal weight loss: Secondary | ICD-10-CM | POA: Diagnosis not present

## 2018-06-24 DIAGNOSIS — R600 Localized edema: Secondary | ICD-10-CM | POA: Diagnosis not present

## 2018-06-24 DIAGNOSIS — I10 Essential (primary) hypertension: Secondary | ICD-10-CM | POA: Diagnosis not present

## 2018-06-24 DIAGNOSIS — L97819 Non-pressure chronic ulcer of other part of right lower leg with unspecified severity: Secondary | ICD-10-CM | POA: Diagnosis not present

## 2018-06-24 DIAGNOSIS — I890 Lymphedema, not elsewhere classified: Secondary | ICD-10-CM | POA: Diagnosis not present

## 2018-06-24 DIAGNOSIS — R5381 Other malaise: Secondary | ICD-10-CM | POA: Diagnosis not present

## 2018-06-24 DIAGNOSIS — L89153 Pressure ulcer of sacral region, stage 3: Secondary | ICD-10-CM | POA: Diagnosis not present

## 2018-06-25 DIAGNOSIS — L89153 Pressure ulcer of sacral region, stage 3: Secondary | ICD-10-CM | POA: Diagnosis not present

## 2018-06-25 DIAGNOSIS — I70299 Other atherosclerosis of native arteries of extremities, unspecified extremity: Secondary | ICD-10-CM | POA: Diagnosis not present

## 2018-06-25 DIAGNOSIS — L97819 Non-pressure chronic ulcer of other part of right lower leg with unspecified severity: Secondary | ICD-10-CM | POA: Diagnosis not present

## 2018-06-25 DIAGNOSIS — R634 Abnormal weight loss: Secondary | ICD-10-CM | POA: Diagnosis not present

## 2018-06-25 DIAGNOSIS — I890 Lymphedema, not elsewhere classified: Secondary | ICD-10-CM | POA: Diagnosis not present

## 2018-06-25 DIAGNOSIS — I872 Venous insufficiency (chronic) (peripheral): Secondary | ICD-10-CM | POA: Diagnosis not present

## 2018-06-26 DIAGNOSIS — L97819 Non-pressure chronic ulcer of other part of right lower leg with unspecified severity: Secondary | ICD-10-CM | POA: Diagnosis not present

## 2018-06-26 DIAGNOSIS — L89153 Pressure ulcer of sacral region, stage 3: Secondary | ICD-10-CM | POA: Diagnosis not present

## 2018-06-26 DIAGNOSIS — I872 Venous insufficiency (chronic) (peripheral): Secondary | ICD-10-CM | POA: Diagnosis not present

## 2018-06-26 DIAGNOSIS — I890 Lymphedema, not elsewhere classified: Secondary | ICD-10-CM | POA: Diagnosis not present

## 2018-06-26 DIAGNOSIS — I70299 Other atherosclerosis of native arteries of extremities, unspecified extremity: Secondary | ICD-10-CM | POA: Diagnosis not present

## 2018-06-26 DIAGNOSIS — R634 Abnormal weight loss: Secondary | ICD-10-CM | POA: Diagnosis not present

## 2018-06-27 DIAGNOSIS — I70299 Other atherosclerosis of native arteries of extremities, unspecified extremity: Secondary | ICD-10-CM | POA: Diagnosis not present

## 2018-06-27 DIAGNOSIS — I872 Venous insufficiency (chronic) (peripheral): Secondary | ICD-10-CM | POA: Diagnosis not present

## 2018-06-27 DIAGNOSIS — L89153 Pressure ulcer of sacral region, stage 3: Secondary | ICD-10-CM | POA: Diagnosis not present

## 2018-06-27 DIAGNOSIS — L97819 Non-pressure chronic ulcer of other part of right lower leg with unspecified severity: Secondary | ICD-10-CM | POA: Diagnosis not present

## 2018-06-27 DIAGNOSIS — I890 Lymphedema, not elsewhere classified: Secondary | ICD-10-CM | POA: Diagnosis not present

## 2018-06-27 DIAGNOSIS — R634 Abnormal weight loss: Secondary | ICD-10-CM | POA: Diagnosis not present

## 2018-06-28 DIAGNOSIS — I890 Lymphedema, not elsewhere classified: Secondary | ICD-10-CM | POA: Diagnosis not present

## 2018-06-28 DIAGNOSIS — I70299 Other atherosclerosis of native arteries of extremities, unspecified extremity: Secondary | ICD-10-CM | POA: Diagnosis not present

## 2018-06-28 DIAGNOSIS — R634 Abnormal weight loss: Secondary | ICD-10-CM | POA: Diagnosis not present

## 2018-06-28 DIAGNOSIS — L89153 Pressure ulcer of sacral region, stage 3: Secondary | ICD-10-CM | POA: Diagnosis not present

## 2018-06-28 DIAGNOSIS — I872 Venous insufficiency (chronic) (peripheral): Secondary | ICD-10-CM | POA: Diagnosis not present

## 2018-06-28 DIAGNOSIS — L97819 Non-pressure chronic ulcer of other part of right lower leg with unspecified severity: Secondary | ICD-10-CM | POA: Diagnosis not present

## 2018-06-29 DIAGNOSIS — L97819 Non-pressure chronic ulcer of other part of right lower leg with unspecified severity: Secondary | ICD-10-CM | POA: Diagnosis not present

## 2018-06-29 DIAGNOSIS — I890 Lymphedema, not elsewhere classified: Secondary | ICD-10-CM | POA: Diagnosis not present

## 2018-06-29 DIAGNOSIS — L89153 Pressure ulcer of sacral region, stage 3: Secondary | ICD-10-CM | POA: Diagnosis not present

## 2018-06-29 DIAGNOSIS — R634 Abnormal weight loss: Secondary | ICD-10-CM | POA: Diagnosis not present

## 2018-06-29 DIAGNOSIS — I872 Venous insufficiency (chronic) (peripheral): Secondary | ICD-10-CM | POA: Diagnosis not present

## 2018-06-29 DIAGNOSIS — I70299 Other atherosclerosis of native arteries of extremities, unspecified extremity: Secondary | ICD-10-CM | POA: Diagnosis not present

## 2018-07-01 DIAGNOSIS — R634 Abnormal weight loss: Secondary | ICD-10-CM | POA: Diagnosis not present

## 2018-07-01 DIAGNOSIS — L89153 Pressure ulcer of sacral region, stage 3: Secondary | ICD-10-CM | POA: Diagnosis not present

## 2018-07-01 DIAGNOSIS — I890 Lymphedema, not elsewhere classified: Secondary | ICD-10-CM | POA: Diagnosis not present

## 2018-07-01 DIAGNOSIS — I872 Venous insufficiency (chronic) (peripheral): Secondary | ICD-10-CM | POA: Diagnosis not present

## 2018-07-01 DIAGNOSIS — I70299 Other atherosclerosis of native arteries of extremities, unspecified extremity: Secondary | ICD-10-CM | POA: Diagnosis not present

## 2018-07-01 DIAGNOSIS — L97819 Non-pressure chronic ulcer of other part of right lower leg with unspecified severity: Secondary | ICD-10-CM | POA: Diagnosis not present

## 2018-07-02 DIAGNOSIS — L97819 Non-pressure chronic ulcer of other part of right lower leg with unspecified severity: Secondary | ICD-10-CM | POA: Diagnosis not present

## 2018-07-02 DIAGNOSIS — L89153 Pressure ulcer of sacral region, stage 3: Secondary | ICD-10-CM | POA: Diagnosis not present

## 2018-07-02 DIAGNOSIS — I890 Lymphedema, not elsewhere classified: Secondary | ICD-10-CM | POA: Diagnosis not present

## 2018-07-02 DIAGNOSIS — I70299 Other atherosclerosis of native arteries of extremities, unspecified extremity: Secondary | ICD-10-CM | POA: Diagnosis not present

## 2018-07-02 DIAGNOSIS — I872 Venous insufficiency (chronic) (peripheral): Secondary | ICD-10-CM | POA: Diagnosis not present

## 2018-07-02 DIAGNOSIS — R634 Abnormal weight loss: Secondary | ICD-10-CM | POA: Diagnosis not present

## 2018-07-03 ENCOUNTER — Ambulatory Visit: Payer: Medicare Other | Admitting: Podiatry

## 2018-07-03 DIAGNOSIS — R634 Abnormal weight loss: Secondary | ICD-10-CM | POA: Diagnosis not present

## 2018-07-03 DIAGNOSIS — I890 Lymphedema, not elsewhere classified: Secondary | ICD-10-CM | POA: Diagnosis not present

## 2018-07-03 DIAGNOSIS — I70299 Other atherosclerosis of native arteries of extremities, unspecified extremity: Secondary | ICD-10-CM | POA: Diagnosis not present

## 2018-07-03 DIAGNOSIS — I872 Venous insufficiency (chronic) (peripheral): Secondary | ICD-10-CM | POA: Diagnosis not present

## 2018-07-03 DIAGNOSIS — L97819 Non-pressure chronic ulcer of other part of right lower leg with unspecified severity: Secondary | ICD-10-CM | POA: Diagnosis not present

## 2018-07-03 DIAGNOSIS — L89153 Pressure ulcer of sacral region, stage 3: Secondary | ICD-10-CM | POA: Diagnosis not present

## 2018-07-04 DIAGNOSIS — L97819 Non-pressure chronic ulcer of other part of right lower leg with unspecified severity: Secondary | ICD-10-CM | POA: Diagnosis not present

## 2018-07-04 DIAGNOSIS — R634 Abnormal weight loss: Secondary | ICD-10-CM | POA: Diagnosis not present

## 2018-07-04 DIAGNOSIS — L89153 Pressure ulcer of sacral region, stage 3: Secondary | ICD-10-CM | POA: Diagnosis not present

## 2018-07-04 DIAGNOSIS — I890 Lymphedema, not elsewhere classified: Secondary | ICD-10-CM | POA: Diagnosis not present

## 2018-07-04 DIAGNOSIS — I70299 Other atherosclerosis of native arteries of extremities, unspecified extremity: Secondary | ICD-10-CM | POA: Diagnosis not present

## 2018-07-04 DIAGNOSIS — I872 Venous insufficiency (chronic) (peripheral): Secondary | ICD-10-CM | POA: Diagnosis not present

## 2018-07-05 DIAGNOSIS — I890 Lymphedema, not elsewhere classified: Secondary | ICD-10-CM | POA: Diagnosis not present

## 2018-07-05 DIAGNOSIS — R634 Abnormal weight loss: Secondary | ICD-10-CM | POA: Diagnosis not present

## 2018-07-05 DIAGNOSIS — L97819 Non-pressure chronic ulcer of other part of right lower leg with unspecified severity: Secondary | ICD-10-CM | POA: Diagnosis not present

## 2018-07-05 DIAGNOSIS — I70299 Other atherosclerosis of native arteries of extremities, unspecified extremity: Secondary | ICD-10-CM | POA: Diagnosis not present

## 2018-07-05 DIAGNOSIS — I872 Venous insufficiency (chronic) (peripheral): Secondary | ICD-10-CM | POA: Diagnosis not present

## 2018-07-05 DIAGNOSIS — L89153 Pressure ulcer of sacral region, stage 3: Secondary | ICD-10-CM | POA: Diagnosis not present

## 2018-07-06 DIAGNOSIS — L89153 Pressure ulcer of sacral region, stage 3: Secondary | ICD-10-CM | POA: Diagnosis not present

## 2018-07-06 DIAGNOSIS — I70299 Other atherosclerosis of native arteries of extremities, unspecified extremity: Secondary | ICD-10-CM | POA: Diagnosis not present

## 2018-07-06 DIAGNOSIS — R634 Abnormal weight loss: Secondary | ICD-10-CM | POA: Diagnosis not present

## 2018-07-06 DIAGNOSIS — L97819 Non-pressure chronic ulcer of other part of right lower leg with unspecified severity: Secondary | ICD-10-CM | POA: Diagnosis not present

## 2018-07-06 DIAGNOSIS — I872 Venous insufficiency (chronic) (peripheral): Secondary | ICD-10-CM | POA: Diagnosis not present

## 2018-07-06 DIAGNOSIS — I890 Lymphedema, not elsewhere classified: Secondary | ICD-10-CM | POA: Diagnosis not present

## 2018-07-08 DIAGNOSIS — I890 Lymphedema, not elsewhere classified: Secondary | ICD-10-CM | POA: Diagnosis not present

## 2018-07-08 DIAGNOSIS — L89153 Pressure ulcer of sacral region, stage 3: Secondary | ICD-10-CM | POA: Diagnosis not present

## 2018-07-08 DIAGNOSIS — I70299 Other atherosclerosis of native arteries of extremities, unspecified extremity: Secondary | ICD-10-CM | POA: Diagnosis not present

## 2018-07-08 DIAGNOSIS — L97819 Non-pressure chronic ulcer of other part of right lower leg with unspecified severity: Secondary | ICD-10-CM | POA: Diagnosis not present

## 2018-07-08 DIAGNOSIS — I872 Venous insufficiency (chronic) (peripheral): Secondary | ICD-10-CM | POA: Diagnosis not present

## 2018-07-08 DIAGNOSIS — R634 Abnormal weight loss: Secondary | ICD-10-CM | POA: Diagnosis not present

## 2018-07-09 DIAGNOSIS — L97819 Non-pressure chronic ulcer of other part of right lower leg with unspecified severity: Secondary | ICD-10-CM | POA: Diagnosis not present

## 2018-07-09 DIAGNOSIS — I872 Venous insufficiency (chronic) (peripheral): Secondary | ICD-10-CM | POA: Diagnosis not present

## 2018-07-09 DIAGNOSIS — I70299 Other atherosclerosis of native arteries of extremities, unspecified extremity: Secondary | ICD-10-CM | POA: Diagnosis not present

## 2018-07-09 DIAGNOSIS — I890 Lymphedema, not elsewhere classified: Secondary | ICD-10-CM | POA: Diagnosis not present

## 2018-07-09 DIAGNOSIS — R634 Abnormal weight loss: Secondary | ICD-10-CM | POA: Diagnosis not present

## 2018-07-09 DIAGNOSIS — L89153 Pressure ulcer of sacral region, stage 3: Secondary | ICD-10-CM | POA: Diagnosis not present

## 2018-07-10 DIAGNOSIS — I872 Venous insufficiency (chronic) (peripheral): Secondary | ICD-10-CM | POA: Diagnosis not present

## 2018-07-10 DIAGNOSIS — L89153 Pressure ulcer of sacral region, stage 3: Secondary | ICD-10-CM | POA: Diagnosis not present

## 2018-07-10 DIAGNOSIS — R634 Abnormal weight loss: Secondary | ICD-10-CM | POA: Diagnosis not present

## 2018-07-10 DIAGNOSIS — I890 Lymphedema, not elsewhere classified: Secondary | ICD-10-CM | POA: Diagnosis not present

## 2018-07-10 DIAGNOSIS — L97819 Non-pressure chronic ulcer of other part of right lower leg with unspecified severity: Secondary | ICD-10-CM | POA: Diagnosis not present

## 2018-07-10 DIAGNOSIS — I70299 Other atherosclerosis of native arteries of extremities, unspecified extremity: Secondary | ICD-10-CM | POA: Diagnosis not present

## 2018-07-11 DIAGNOSIS — L89153 Pressure ulcer of sacral region, stage 3: Secondary | ICD-10-CM | POA: Diagnosis not present

## 2018-07-11 DIAGNOSIS — I872 Venous insufficiency (chronic) (peripheral): Secondary | ICD-10-CM | POA: Diagnosis not present

## 2018-07-11 DIAGNOSIS — I70299 Other atherosclerosis of native arteries of extremities, unspecified extremity: Secondary | ICD-10-CM | POA: Diagnosis not present

## 2018-07-11 DIAGNOSIS — L97819 Non-pressure chronic ulcer of other part of right lower leg with unspecified severity: Secondary | ICD-10-CM | POA: Diagnosis not present

## 2018-07-11 DIAGNOSIS — I890 Lymphedema, not elsewhere classified: Secondary | ICD-10-CM | POA: Diagnosis not present

## 2018-07-11 DIAGNOSIS — R634 Abnormal weight loss: Secondary | ICD-10-CM | POA: Diagnosis not present

## 2018-07-12 DIAGNOSIS — I890 Lymphedema, not elsewhere classified: Secondary | ICD-10-CM | POA: Diagnosis not present

## 2018-07-12 DIAGNOSIS — L89153 Pressure ulcer of sacral region, stage 3: Secondary | ICD-10-CM | POA: Diagnosis not present

## 2018-07-12 DIAGNOSIS — I70299 Other atherosclerosis of native arteries of extremities, unspecified extremity: Secondary | ICD-10-CM | POA: Diagnosis not present

## 2018-07-12 DIAGNOSIS — R634 Abnormal weight loss: Secondary | ICD-10-CM | POA: Diagnosis not present

## 2018-07-12 DIAGNOSIS — L97819 Non-pressure chronic ulcer of other part of right lower leg with unspecified severity: Secondary | ICD-10-CM | POA: Diagnosis not present

## 2018-07-12 DIAGNOSIS — I872 Venous insufficiency (chronic) (peripheral): Secondary | ICD-10-CM | POA: Diagnosis not present

## 2018-07-13 DIAGNOSIS — I70299 Other atherosclerosis of native arteries of extremities, unspecified extremity: Secondary | ICD-10-CM | POA: Diagnosis not present

## 2018-07-13 DIAGNOSIS — R634 Abnormal weight loss: Secondary | ICD-10-CM | POA: Diagnosis not present

## 2018-07-13 DIAGNOSIS — L89153 Pressure ulcer of sacral region, stage 3: Secondary | ICD-10-CM | POA: Diagnosis not present

## 2018-07-13 DIAGNOSIS — L97819 Non-pressure chronic ulcer of other part of right lower leg with unspecified severity: Secondary | ICD-10-CM | POA: Diagnosis not present

## 2018-07-13 DIAGNOSIS — I890 Lymphedema, not elsewhere classified: Secondary | ICD-10-CM | POA: Diagnosis not present

## 2018-07-13 DIAGNOSIS — I872 Venous insufficiency (chronic) (peripheral): Secondary | ICD-10-CM | POA: Diagnosis not present

## 2018-07-15 DIAGNOSIS — I872 Venous insufficiency (chronic) (peripheral): Secondary | ICD-10-CM | POA: Diagnosis not present

## 2018-07-15 DIAGNOSIS — I890 Lymphedema, not elsewhere classified: Secondary | ICD-10-CM | POA: Diagnosis not present

## 2018-07-15 DIAGNOSIS — I70299 Other atherosclerosis of native arteries of extremities, unspecified extremity: Secondary | ICD-10-CM | POA: Diagnosis not present

## 2018-07-15 DIAGNOSIS — R634 Abnormal weight loss: Secondary | ICD-10-CM | POA: Diagnosis not present

## 2018-07-15 DIAGNOSIS — L97819 Non-pressure chronic ulcer of other part of right lower leg with unspecified severity: Secondary | ICD-10-CM | POA: Diagnosis not present

## 2018-07-15 DIAGNOSIS — L89153 Pressure ulcer of sacral region, stage 3: Secondary | ICD-10-CM | POA: Diagnosis not present

## 2018-07-16 DIAGNOSIS — L89153 Pressure ulcer of sacral region, stage 3: Secondary | ICD-10-CM | POA: Diagnosis not present

## 2018-07-16 DIAGNOSIS — I70299 Other atherosclerosis of native arteries of extremities, unspecified extremity: Secondary | ICD-10-CM | POA: Diagnosis not present

## 2018-07-16 DIAGNOSIS — I872 Venous insufficiency (chronic) (peripheral): Secondary | ICD-10-CM | POA: Diagnosis not present

## 2018-07-16 DIAGNOSIS — L97819 Non-pressure chronic ulcer of other part of right lower leg with unspecified severity: Secondary | ICD-10-CM | POA: Diagnosis not present

## 2018-07-16 DIAGNOSIS — I890 Lymphedema, not elsewhere classified: Secondary | ICD-10-CM | POA: Diagnosis not present

## 2018-07-16 DIAGNOSIS — R634 Abnormal weight loss: Secondary | ICD-10-CM | POA: Diagnosis not present

## 2018-07-17 DIAGNOSIS — I70299 Other atherosclerosis of native arteries of extremities, unspecified extremity: Secondary | ICD-10-CM | POA: Diagnosis not present

## 2018-07-17 DIAGNOSIS — I872 Venous insufficiency (chronic) (peripheral): Secondary | ICD-10-CM | POA: Diagnosis not present

## 2018-07-17 DIAGNOSIS — I890 Lymphedema, not elsewhere classified: Secondary | ICD-10-CM | POA: Diagnosis not present

## 2018-07-17 DIAGNOSIS — R634 Abnormal weight loss: Secondary | ICD-10-CM | POA: Diagnosis not present

## 2018-07-17 DIAGNOSIS — L89153 Pressure ulcer of sacral region, stage 3: Secondary | ICD-10-CM | POA: Diagnosis not present

## 2018-07-17 DIAGNOSIS — L97819 Non-pressure chronic ulcer of other part of right lower leg with unspecified severity: Secondary | ICD-10-CM | POA: Diagnosis not present

## 2018-07-18 DIAGNOSIS — L89153 Pressure ulcer of sacral region, stage 3: Secondary | ICD-10-CM | POA: Diagnosis not present

## 2018-07-18 DIAGNOSIS — I70299 Other atherosclerosis of native arteries of extremities, unspecified extremity: Secondary | ICD-10-CM | POA: Diagnosis not present

## 2018-07-18 DIAGNOSIS — I872 Venous insufficiency (chronic) (peripheral): Secondary | ICD-10-CM | POA: Diagnosis not present

## 2018-07-18 DIAGNOSIS — I890 Lymphedema, not elsewhere classified: Secondary | ICD-10-CM | POA: Diagnosis not present

## 2018-07-18 DIAGNOSIS — R634 Abnormal weight loss: Secondary | ICD-10-CM | POA: Diagnosis not present

## 2018-07-18 DIAGNOSIS — L97819 Non-pressure chronic ulcer of other part of right lower leg with unspecified severity: Secondary | ICD-10-CM | POA: Diagnosis not present

## 2018-07-19 DIAGNOSIS — I890 Lymphedema, not elsewhere classified: Secondary | ICD-10-CM | POA: Diagnosis not present

## 2018-07-19 DIAGNOSIS — L97819 Non-pressure chronic ulcer of other part of right lower leg with unspecified severity: Secondary | ICD-10-CM | POA: Diagnosis not present

## 2018-07-19 DIAGNOSIS — I872 Venous insufficiency (chronic) (peripheral): Secondary | ICD-10-CM | POA: Diagnosis not present

## 2018-07-19 DIAGNOSIS — I70299 Other atherosclerosis of native arteries of extremities, unspecified extremity: Secondary | ICD-10-CM | POA: Diagnosis not present

## 2018-07-19 DIAGNOSIS — L89153 Pressure ulcer of sacral region, stage 3: Secondary | ICD-10-CM | POA: Diagnosis not present

## 2018-07-19 DIAGNOSIS — R634 Abnormal weight loss: Secondary | ICD-10-CM | POA: Diagnosis not present

## 2018-07-20 DIAGNOSIS — L97819 Non-pressure chronic ulcer of other part of right lower leg with unspecified severity: Secondary | ICD-10-CM | POA: Diagnosis not present

## 2018-07-20 DIAGNOSIS — R634 Abnormal weight loss: Secondary | ICD-10-CM | POA: Diagnosis not present

## 2018-07-20 DIAGNOSIS — I890 Lymphedema, not elsewhere classified: Secondary | ICD-10-CM | POA: Diagnosis not present

## 2018-07-20 DIAGNOSIS — L89153 Pressure ulcer of sacral region, stage 3: Secondary | ICD-10-CM | POA: Diagnosis not present

## 2018-07-20 DIAGNOSIS — I872 Venous insufficiency (chronic) (peripheral): Secondary | ICD-10-CM | POA: Diagnosis not present

## 2018-07-20 DIAGNOSIS — I70299 Other atherosclerosis of native arteries of extremities, unspecified extremity: Secondary | ICD-10-CM | POA: Diagnosis not present

## 2018-07-22 DIAGNOSIS — I872 Venous insufficiency (chronic) (peripheral): Secondary | ICD-10-CM | POA: Diagnosis not present

## 2018-07-22 DIAGNOSIS — I890 Lymphedema, not elsewhere classified: Secondary | ICD-10-CM | POA: Diagnosis not present

## 2018-07-22 DIAGNOSIS — R634 Abnormal weight loss: Secondary | ICD-10-CM | POA: Diagnosis not present

## 2018-07-22 DIAGNOSIS — I70299 Other atherosclerosis of native arteries of extremities, unspecified extremity: Secondary | ICD-10-CM | POA: Diagnosis not present

## 2018-07-22 DIAGNOSIS — L89153 Pressure ulcer of sacral region, stage 3: Secondary | ICD-10-CM | POA: Diagnosis not present

## 2018-07-22 DIAGNOSIS — L97819 Non-pressure chronic ulcer of other part of right lower leg with unspecified severity: Secondary | ICD-10-CM | POA: Diagnosis not present

## 2018-07-23 DIAGNOSIS — I70299 Other atherosclerosis of native arteries of extremities, unspecified extremity: Secondary | ICD-10-CM | POA: Diagnosis not present

## 2018-07-23 DIAGNOSIS — I890 Lymphedema, not elsewhere classified: Secondary | ICD-10-CM | POA: Diagnosis not present

## 2018-07-23 DIAGNOSIS — I872 Venous insufficiency (chronic) (peripheral): Secondary | ICD-10-CM | POA: Diagnosis not present

## 2018-07-23 DIAGNOSIS — R634 Abnormal weight loss: Secondary | ICD-10-CM | POA: Diagnosis not present

## 2018-07-23 DIAGNOSIS — L97819 Non-pressure chronic ulcer of other part of right lower leg with unspecified severity: Secondary | ICD-10-CM | POA: Diagnosis not present

## 2018-07-23 DIAGNOSIS — L89153 Pressure ulcer of sacral region, stage 3: Secondary | ICD-10-CM | POA: Diagnosis not present

## 2018-07-24 DIAGNOSIS — L97819 Non-pressure chronic ulcer of other part of right lower leg with unspecified severity: Secondary | ICD-10-CM | POA: Diagnosis not present

## 2018-07-24 DIAGNOSIS — R634 Abnormal weight loss: Secondary | ICD-10-CM | POA: Diagnosis not present

## 2018-07-24 DIAGNOSIS — I872 Venous insufficiency (chronic) (peripheral): Secondary | ICD-10-CM | POA: Diagnosis not present

## 2018-07-24 DIAGNOSIS — L89153 Pressure ulcer of sacral region, stage 3: Secondary | ICD-10-CM | POA: Diagnosis not present

## 2018-07-24 DIAGNOSIS — I70299 Other atherosclerosis of native arteries of extremities, unspecified extremity: Secondary | ICD-10-CM | POA: Diagnosis not present

## 2018-07-24 DIAGNOSIS — I890 Lymphedema, not elsewhere classified: Secondary | ICD-10-CM | POA: Diagnosis not present

## 2018-07-25 DIAGNOSIS — L89153 Pressure ulcer of sacral region, stage 3: Secondary | ICD-10-CM | POA: Diagnosis not present

## 2018-07-25 DIAGNOSIS — I890 Lymphedema, not elsewhere classified: Secondary | ICD-10-CM | POA: Diagnosis not present

## 2018-07-25 DIAGNOSIS — I70299 Other atherosclerosis of native arteries of extremities, unspecified extremity: Secondary | ICD-10-CM | POA: Diagnosis not present

## 2018-07-25 DIAGNOSIS — R5381 Other malaise: Secondary | ICD-10-CM | POA: Diagnosis not present

## 2018-07-25 DIAGNOSIS — I872 Venous insufficiency (chronic) (peripheral): Secondary | ICD-10-CM | POA: Diagnosis not present

## 2018-07-25 DIAGNOSIS — R600 Localized edema: Secondary | ICD-10-CM | POA: Diagnosis not present

## 2018-07-25 DIAGNOSIS — R634 Abnormal weight loss: Secondary | ICD-10-CM | POA: Diagnosis not present

## 2018-07-25 DIAGNOSIS — I10 Essential (primary) hypertension: Secondary | ICD-10-CM | POA: Diagnosis not present

## 2018-07-25 DIAGNOSIS — L97819 Non-pressure chronic ulcer of other part of right lower leg with unspecified severity: Secondary | ICD-10-CM | POA: Diagnosis not present

## 2018-07-26 DIAGNOSIS — L97819 Non-pressure chronic ulcer of other part of right lower leg with unspecified severity: Secondary | ICD-10-CM | POA: Diagnosis not present

## 2018-07-26 DIAGNOSIS — I890 Lymphedema, not elsewhere classified: Secondary | ICD-10-CM | POA: Diagnosis not present

## 2018-07-26 DIAGNOSIS — L89153 Pressure ulcer of sacral region, stage 3: Secondary | ICD-10-CM | POA: Diagnosis not present

## 2018-07-26 DIAGNOSIS — I70299 Other atherosclerosis of native arteries of extremities, unspecified extremity: Secondary | ICD-10-CM | POA: Diagnosis not present

## 2018-07-26 DIAGNOSIS — I872 Venous insufficiency (chronic) (peripheral): Secondary | ICD-10-CM | POA: Diagnosis not present

## 2018-07-26 DIAGNOSIS — R634 Abnormal weight loss: Secondary | ICD-10-CM | POA: Diagnosis not present

## 2018-07-27 DIAGNOSIS — I872 Venous insufficiency (chronic) (peripheral): Secondary | ICD-10-CM | POA: Diagnosis not present

## 2018-07-27 DIAGNOSIS — L97819 Non-pressure chronic ulcer of other part of right lower leg with unspecified severity: Secondary | ICD-10-CM | POA: Diagnosis not present

## 2018-07-27 DIAGNOSIS — L89153 Pressure ulcer of sacral region, stage 3: Secondary | ICD-10-CM | POA: Diagnosis not present

## 2018-07-27 DIAGNOSIS — I70299 Other atherosclerosis of native arteries of extremities, unspecified extremity: Secondary | ICD-10-CM | POA: Diagnosis not present

## 2018-07-27 DIAGNOSIS — R634 Abnormal weight loss: Secondary | ICD-10-CM | POA: Diagnosis not present

## 2018-07-27 DIAGNOSIS — I890 Lymphedema, not elsewhere classified: Secondary | ICD-10-CM | POA: Diagnosis not present

## 2018-07-29 ENCOUNTER — Telehealth: Payer: Self-pay | Admitting: *Deleted

## 2018-07-29 DIAGNOSIS — I872 Venous insufficiency (chronic) (peripheral): Secondary | ICD-10-CM | POA: Diagnosis not present

## 2018-07-29 DIAGNOSIS — L97819 Non-pressure chronic ulcer of other part of right lower leg with unspecified severity: Secondary | ICD-10-CM | POA: Diagnosis not present

## 2018-07-29 DIAGNOSIS — L89153 Pressure ulcer of sacral region, stage 3: Secondary | ICD-10-CM | POA: Diagnosis not present

## 2018-07-29 DIAGNOSIS — I70299 Other atherosclerosis of native arteries of extremities, unspecified extremity: Secondary | ICD-10-CM | POA: Diagnosis not present

## 2018-07-29 DIAGNOSIS — R634 Abnormal weight loss: Secondary | ICD-10-CM | POA: Diagnosis not present

## 2018-07-29 DIAGNOSIS — I890 Lymphedema, not elsewhere classified: Secondary | ICD-10-CM | POA: Diagnosis not present

## 2018-07-29 NOTE — Telephone Encounter (Signed)
Received Home Health Discharge-Transfer Summary for review from [facility]; forwarded to provider/SLS 02/05

## 2018-07-30 DIAGNOSIS — L89153 Pressure ulcer of sacral region, stage 3: Secondary | ICD-10-CM | POA: Diagnosis not present

## 2018-07-30 DIAGNOSIS — I70299 Other atherosclerosis of native arteries of extremities, unspecified extremity: Secondary | ICD-10-CM | POA: Diagnosis not present

## 2018-07-30 DIAGNOSIS — I890 Lymphedema, not elsewhere classified: Secondary | ICD-10-CM | POA: Diagnosis not present

## 2018-07-30 DIAGNOSIS — R634 Abnormal weight loss: Secondary | ICD-10-CM | POA: Diagnosis not present

## 2018-07-30 DIAGNOSIS — L97819 Non-pressure chronic ulcer of other part of right lower leg with unspecified severity: Secondary | ICD-10-CM | POA: Diagnosis not present

## 2018-07-30 DIAGNOSIS — I872 Venous insufficiency (chronic) (peripheral): Secondary | ICD-10-CM | POA: Diagnosis not present

## 2018-07-31 DIAGNOSIS — I70299 Other atherosclerosis of native arteries of extremities, unspecified extremity: Secondary | ICD-10-CM | POA: Diagnosis not present

## 2018-07-31 DIAGNOSIS — L97819 Non-pressure chronic ulcer of other part of right lower leg with unspecified severity: Secondary | ICD-10-CM | POA: Diagnosis not present

## 2018-07-31 DIAGNOSIS — I872 Venous insufficiency (chronic) (peripheral): Secondary | ICD-10-CM | POA: Diagnosis not present

## 2018-07-31 DIAGNOSIS — I890 Lymphedema, not elsewhere classified: Secondary | ICD-10-CM | POA: Diagnosis not present

## 2018-07-31 DIAGNOSIS — R634 Abnormal weight loss: Secondary | ICD-10-CM | POA: Diagnosis not present

## 2018-07-31 DIAGNOSIS — L89153 Pressure ulcer of sacral region, stage 3: Secondary | ICD-10-CM | POA: Diagnosis not present

## 2018-08-01 DIAGNOSIS — R634 Abnormal weight loss: Secondary | ICD-10-CM | POA: Diagnosis not present

## 2018-08-01 DIAGNOSIS — L97819 Non-pressure chronic ulcer of other part of right lower leg with unspecified severity: Secondary | ICD-10-CM | POA: Diagnosis not present

## 2018-08-01 DIAGNOSIS — I872 Venous insufficiency (chronic) (peripheral): Secondary | ICD-10-CM | POA: Diagnosis not present

## 2018-08-01 DIAGNOSIS — I890 Lymphedema, not elsewhere classified: Secondary | ICD-10-CM | POA: Diagnosis not present

## 2018-08-01 DIAGNOSIS — L89153 Pressure ulcer of sacral region, stage 3: Secondary | ICD-10-CM | POA: Diagnosis not present

## 2018-08-01 DIAGNOSIS — I70299 Other atherosclerosis of native arteries of extremities, unspecified extremity: Secondary | ICD-10-CM | POA: Diagnosis not present

## 2018-08-02 DIAGNOSIS — I70299 Other atherosclerosis of native arteries of extremities, unspecified extremity: Secondary | ICD-10-CM | POA: Diagnosis not present

## 2018-08-02 DIAGNOSIS — R634 Abnormal weight loss: Secondary | ICD-10-CM | POA: Diagnosis not present

## 2018-08-02 DIAGNOSIS — L89153 Pressure ulcer of sacral region, stage 3: Secondary | ICD-10-CM | POA: Diagnosis not present

## 2018-08-02 DIAGNOSIS — L97819 Non-pressure chronic ulcer of other part of right lower leg with unspecified severity: Secondary | ICD-10-CM | POA: Diagnosis not present

## 2018-08-02 DIAGNOSIS — I872 Venous insufficiency (chronic) (peripheral): Secondary | ICD-10-CM | POA: Diagnosis not present

## 2018-08-02 DIAGNOSIS — I890 Lymphedema, not elsewhere classified: Secondary | ICD-10-CM | POA: Diagnosis not present

## 2018-08-03 DIAGNOSIS — I70299 Other atherosclerosis of native arteries of extremities, unspecified extremity: Secondary | ICD-10-CM | POA: Diagnosis not present

## 2018-08-03 DIAGNOSIS — R634 Abnormal weight loss: Secondary | ICD-10-CM | POA: Diagnosis not present

## 2018-08-03 DIAGNOSIS — I890 Lymphedema, not elsewhere classified: Secondary | ICD-10-CM | POA: Diagnosis not present

## 2018-08-03 DIAGNOSIS — L97819 Non-pressure chronic ulcer of other part of right lower leg with unspecified severity: Secondary | ICD-10-CM | POA: Diagnosis not present

## 2018-08-03 DIAGNOSIS — L89153 Pressure ulcer of sacral region, stage 3: Secondary | ICD-10-CM | POA: Diagnosis not present

## 2018-08-03 DIAGNOSIS — I872 Venous insufficiency (chronic) (peripheral): Secondary | ICD-10-CM | POA: Diagnosis not present

## 2018-08-05 DIAGNOSIS — I890 Lymphedema, not elsewhere classified: Secondary | ICD-10-CM | POA: Diagnosis not present

## 2018-08-05 DIAGNOSIS — L97819 Non-pressure chronic ulcer of other part of right lower leg with unspecified severity: Secondary | ICD-10-CM | POA: Diagnosis not present

## 2018-08-05 DIAGNOSIS — I872 Venous insufficiency (chronic) (peripheral): Secondary | ICD-10-CM | POA: Diagnosis not present

## 2018-08-05 DIAGNOSIS — I70299 Other atherosclerosis of native arteries of extremities, unspecified extremity: Secondary | ICD-10-CM | POA: Diagnosis not present

## 2018-08-05 DIAGNOSIS — R634 Abnormal weight loss: Secondary | ICD-10-CM | POA: Diagnosis not present

## 2018-08-05 DIAGNOSIS — L89153 Pressure ulcer of sacral region, stage 3: Secondary | ICD-10-CM | POA: Diagnosis not present

## 2018-08-06 DIAGNOSIS — I872 Venous insufficiency (chronic) (peripheral): Secondary | ICD-10-CM | POA: Diagnosis not present

## 2018-08-06 DIAGNOSIS — L97819 Non-pressure chronic ulcer of other part of right lower leg with unspecified severity: Secondary | ICD-10-CM | POA: Diagnosis not present

## 2018-08-06 DIAGNOSIS — I890 Lymphedema, not elsewhere classified: Secondary | ICD-10-CM | POA: Diagnosis not present

## 2018-08-06 DIAGNOSIS — R634 Abnormal weight loss: Secondary | ICD-10-CM | POA: Diagnosis not present

## 2018-08-06 DIAGNOSIS — L89153 Pressure ulcer of sacral region, stage 3: Secondary | ICD-10-CM | POA: Diagnosis not present

## 2018-08-06 DIAGNOSIS — I70299 Other atherosclerosis of native arteries of extremities, unspecified extremity: Secondary | ICD-10-CM | POA: Diagnosis not present

## 2018-08-07 DIAGNOSIS — L89153 Pressure ulcer of sacral region, stage 3: Secondary | ICD-10-CM | POA: Diagnosis not present

## 2018-08-07 DIAGNOSIS — I890 Lymphedema, not elsewhere classified: Secondary | ICD-10-CM | POA: Diagnosis not present

## 2018-08-07 DIAGNOSIS — I70299 Other atherosclerosis of native arteries of extremities, unspecified extremity: Secondary | ICD-10-CM | POA: Diagnosis not present

## 2018-08-07 DIAGNOSIS — R634 Abnormal weight loss: Secondary | ICD-10-CM | POA: Diagnosis not present

## 2018-08-07 DIAGNOSIS — L97819 Non-pressure chronic ulcer of other part of right lower leg with unspecified severity: Secondary | ICD-10-CM | POA: Diagnosis not present

## 2018-08-07 DIAGNOSIS — I872 Venous insufficiency (chronic) (peripheral): Secondary | ICD-10-CM | POA: Diagnosis not present

## 2018-08-08 DIAGNOSIS — R634 Abnormal weight loss: Secondary | ICD-10-CM | POA: Diagnosis not present

## 2018-08-08 DIAGNOSIS — I70299 Other atherosclerosis of native arteries of extremities, unspecified extremity: Secondary | ICD-10-CM | POA: Diagnosis not present

## 2018-08-08 DIAGNOSIS — I872 Venous insufficiency (chronic) (peripheral): Secondary | ICD-10-CM | POA: Diagnosis not present

## 2018-08-08 DIAGNOSIS — L89153 Pressure ulcer of sacral region, stage 3: Secondary | ICD-10-CM | POA: Diagnosis not present

## 2018-08-08 DIAGNOSIS — L97819 Non-pressure chronic ulcer of other part of right lower leg with unspecified severity: Secondary | ICD-10-CM | POA: Diagnosis not present

## 2018-08-08 DIAGNOSIS — I890 Lymphedema, not elsewhere classified: Secondary | ICD-10-CM | POA: Diagnosis not present

## 2018-08-09 DIAGNOSIS — I890 Lymphedema, not elsewhere classified: Secondary | ICD-10-CM | POA: Diagnosis not present

## 2018-08-09 DIAGNOSIS — I872 Venous insufficiency (chronic) (peripheral): Secondary | ICD-10-CM | POA: Diagnosis not present

## 2018-08-09 DIAGNOSIS — R634 Abnormal weight loss: Secondary | ICD-10-CM | POA: Diagnosis not present

## 2018-08-09 DIAGNOSIS — I70299 Other atherosclerosis of native arteries of extremities, unspecified extremity: Secondary | ICD-10-CM | POA: Diagnosis not present

## 2018-08-09 DIAGNOSIS — L89153 Pressure ulcer of sacral region, stage 3: Secondary | ICD-10-CM | POA: Diagnosis not present

## 2018-08-09 DIAGNOSIS — L97819 Non-pressure chronic ulcer of other part of right lower leg with unspecified severity: Secondary | ICD-10-CM | POA: Diagnosis not present

## 2018-08-10 DIAGNOSIS — R634 Abnormal weight loss: Secondary | ICD-10-CM | POA: Diagnosis not present

## 2018-08-10 DIAGNOSIS — L97819 Non-pressure chronic ulcer of other part of right lower leg with unspecified severity: Secondary | ICD-10-CM | POA: Diagnosis not present

## 2018-08-10 DIAGNOSIS — I70299 Other atherosclerosis of native arteries of extremities, unspecified extremity: Secondary | ICD-10-CM | POA: Diagnosis not present

## 2018-08-10 DIAGNOSIS — L89153 Pressure ulcer of sacral region, stage 3: Secondary | ICD-10-CM | POA: Diagnosis not present

## 2018-08-10 DIAGNOSIS — I872 Venous insufficiency (chronic) (peripheral): Secondary | ICD-10-CM | POA: Diagnosis not present

## 2018-08-10 DIAGNOSIS — I890 Lymphedema, not elsewhere classified: Secondary | ICD-10-CM | POA: Diagnosis not present

## 2018-08-12 DIAGNOSIS — I70299 Other atherosclerosis of native arteries of extremities, unspecified extremity: Secondary | ICD-10-CM | POA: Diagnosis not present

## 2018-08-12 DIAGNOSIS — L89153 Pressure ulcer of sacral region, stage 3: Secondary | ICD-10-CM | POA: Diagnosis not present

## 2018-08-12 DIAGNOSIS — L97819 Non-pressure chronic ulcer of other part of right lower leg with unspecified severity: Secondary | ICD-10-CM | POA: Diagnosis not present

## 2018-08-12 DIAGNOSIS — I872 Venous insufficiency (chronic) (peripheral): Secondary | ICD-10-CM | POA: Diagnosis not present

## 2018-08-12 DIAGNOSIS — I890 Lymphedema, not elsewhere classified: Secondary | ICD-10-CM | POA: Diagnosis not present

## 2018-08-12 DIAGNOSIS — R634 Abnormal weight loss: Secondary | ICD-10-CM | POA: Diagnosis not present

## 2018-08-13 DIAGNOSIS — I70299 Other atherosclerosis of native arteries of extremities, unspecified extremity: Secondary | ICD-10-CM | POA: Diagnosis not present

## 2018-08-13 DIAGNOSIS — L89153 Pressure ulcer of sacral region, stage 3: Secondary | ICD-10-CM | POA: Diagnosis not present

## 2018-08-13 DIAGNOSIS — L97819 Non-pressure chronic ulcer of other part of right lower leg with unspecified severity: Secondary | ICD-10-CM | POA: Diagnosis not present

## 2018-08-13 DIAGNOSIS — I872 Venous insufficiency (chronic) (peripheral): Secondary | ICD-10-CM | POA: Diagnosis not present

## 2018-08-13 DIAGNOSIS — R634 Abnormal weight loss: Secondary | ICD-10-CM | POA: Diagnosis not present

## 2018-08-13 DIAGNOSIS — I890 Lymphedema, not elsewhere classified: Secondary | ICD-10-CM | POA: Diagnosis not present

## 2018-08-14 DIAGNOSIS — L89153 Pressure ulcer of sacral region, stage 3: Secondary | ICD-10-CM | POA: Diagnosis not present

## 2018-08-14 DIAGNOSIS — R634 Abnormal weight loss: Secondary | ICD-10-CM | POA: Diagnosis not present

## 2018-08-14 DIAGNOSIS — I70299 Other atherosclerosis of native arteries of extremities, unspecified extremity: Secondary | ICD-10-CM | POA: Diagnosis not present

## 2018-08-14 DIAGNOSIS — I872 Venous insufficiency (chronic) (peripheral): Secondary | ICD-10-CM | POA: Diagnosis not present

## 2018-08-14 DIAGNOSIS — I890 Lymphedema, not elsewhere classified: Secondary | ICD-10-CM | POA: Diagnosis not present

## 2018-08-14 DIAGNOSIS — L97819 Non-pressure chronic ulcer of other part of right lower leg with unspecified severity: Secondary | ICD-10-CM | POA: Diagnosis not present

## 2018-08-15 DIAGNOSIS — L97819 Non-pressure chronic ulcer of other part of right lower leg with unspecified severity: Secondary | ICD-10-CM | POA: Diagnosis not present

## 2018-08-15 DIAGNOSIS — I890 Lymphedema, not elsewhere classified: Secondary | ICD-10-CM | POA: Diagnosis not present

## 2018-08-15 DIAGNOSIS — L89153 Pressure ulcer of sacral region, stage 3: Secondary | ICD-10-CM | POA: Diagnosis not present

## 2018-08-15 DIAGNOSIS — I872 Venous insufficiency (chronic) (peripheral): Secondary | ICD-10-CM | POA: Diagnosis not present

## 2018-08-15 DIAGNOSIS — I70299 Other atherosclerosis of native arteries of extremities, unspecified extremity: Secondary | ICD-10-CM | POA: Diagnosis not present

## 2018-08-15 DIAGNOSIS — R634 Abnormal weight loss: Secondary | ICD-10-CM | POA: Diagnosis not present

## 2018-08-16 DIAGNOSIS — I70299 Other atherosclerosis of native arteries of extremities, unspecified extremity: Secondary | ICD-10-CM | POA: Diagnosis not present

## 2018-08-16 DIAGNOSIS — L89153 Pressure ulcer of sacral region, stage 3: Secondary | ICD-10-CM | POA: Diagnosis not present

## 2018-08-16 DIAGNOSIS — L97819 Non-pressure chronic ulcer of other part of right lower leg with unspecified severity: Secondary | ICD-10-CM | POA: Diagnosis not present

## 2018-08-16 DIAGNOSIS — I890 Lymphedema, not elsewhere classified: Secondary | ICD-10-CM | POA: Diagnosis not present

## 2018-08-16 DIAGNOSIS — R634 Abnormal weight loss: Secondary | ICD-10-CM | POA: Diagnosis not present

## 2018-08-16 DIAGNOSIS — I872 Venous insufficiency (chronic) (peripheral): Secondary | ICD-10-CM | POA: Diagnosis not present

## 2018-08-17 DIAGNOSIS — L89153 Pressure ulcer of sacral region, stage 3: Secondary | ICD-10-CM | POA: Diagnosis not present

## 2018-08-17 DIAGNOSIS — R634 Abnormal weight loss: Secondary | ICD-10-CM | POA: Diagnosis not present

## 2018-08-17 DIAGNOSIS — I890 Lymphedema, not elsewhere classified: Secondary | ICD-10-CM | POA: Diagnosis not present

## 2018-08-17 DIAGNOSIS — L97819 Non-pressure chronic ulcer of other part of right lower leg with unspecified severity: Secondary | ICD-10-CM | POA: Diagnosis not present

## 2018-08-17 DIAGNOSIS — I872 Venous insufficiency (chronic) (peripheral): Secondary | ICD-10-CM | POA: Diagnosis not present

## 2018-08-17 DIAGNOSIS — I70299 Other atherosclerosis of native arteries of extremities, unspecified extremity: Secondary | ICD-10-CM | POA: Diagnosis not present

## 2018-08-19 DIAGNOSIS — L97819 Non-pressure chronic ulcer of other part of right lower leg with unspecified severity: Secondary | ICD-10-CM | POA: Diagnosis not present

## 2018-08-19 DIAGNOSIS — L89153 Pressure ulcer of sacral region, stage 3: Secondary | ICD-10-CM | POA: Diagnosis not present

## 2018-08-19 DIAGNOSIS — I70299 Other atherosclerosis of native arteries of extremities, unspecified extremity: Secondary | ICD-10-CM | POA: Diagnosis not present

## 2018-08-19 DIAGNOSIS — I872 Venous insufficiency (chronic) (peripheral): Secondary | ICD-10-CM | POA: Diagnosis not present

## 2018-08-19 DIAGNOSIS — I890 Lymphedema, not elsewhere classified: Secondary | ICD-10-CM | POA: Diagnosis not present

## 2018-08-19 DIAGNOSIS — R634 Abnormal weight loss: Secondary | ICD-10-CM | POA: Diagnosis not present

## 2018-08-20 DIAGNOSIS — L89153 Pressure ulcer of sacral region, stage 3: Secondary | ICD-10-CM | POA: Diagnosis not present

## 2018-08-20 DIAGNOSIS — L97819 Non-pressure chronic ulcer of other part of right lower leg with unspecified severity: Secondary | ICD-10-CM | POA: Diagnosis not present

## 2018-08-20 DIAGNOSIS — I872 Venous insufficiency (chronic) (peripheral): Secondary | ICD-10-CM | POA: Diagnosis not present

## 2018-08-20 DIAGNOSIS — I70299 Other atherosclerosis of native arteries of extremities, unspecified extremity: Secondary | ICD-10-CM | POA: Diagnosis not present

## 2018-08-20 DIAGNOSIS — I890 Lymphedema, not elsewhere classified: Secondary | ICD-10-CM | POA: Diagnosis not present

## 2018-08-20 DIAGNOSIS — R634 Abnormal weight loss: Secondary | ICD-10-CM | POA: Diagnosis not present

## 2018-08-21 DIAGNOSIS — R634 Abnormal weight loss: Secondary | ICD-10-CM | POA: Diagnosis not present

## 2018-08-21 DIAGNOSIS — I70299 Other atherosclerosis of native arteries of extremities, unspecified extremity: Secondary | ICD-10-CM | POA: Diagnosis not present

## 2018-08-21 DIAGNOSIS — I890 Lymphedema, not elsewhere classified: Secondary | ICD-10-CM | POA: Diagnosis not present

## 2018-08-21 DIAGNOSIS — L97819 Non-pressure chronic ulcer of other part of right lower leg with unspecified severity: Secondary | ICD-10-CM | POA: Diagnosis not present

## 2018-08-21 DIAGNOSIS — I872 Venous insufficiency (chronic) (peripheral): Secondary | ICD-10-CM | POA: Diagnosis not present

## 2018-08-21 DIAGNOSIS — L89153 Pressure ulcer of sacral region, stage 3: Secondary | ICD-10-CM | POA: Diagnosis not present

## 2018-08-22 DIAGNOSIS — L97819 Non-pressure chronic ulcer of other part of right lower leg with unspecified severity: Secondary | ICD-10-CM | POA: Diagnosis not present

## 2018-08-22 DIAGNOSIS — L89153 Pressure ulcer of sacral region, stage 3: Secondary | ICD-10-CM | POA: Diagnosis not present

## 2018-08-22 DIAGNOSIS — I70299 Other atherosclerosis of native arteries of extremities, unspecified extremity: Secondary | ICD-10-CM | POA: Diagnosis not present

## 2018-08-22 DIAGNOSIS — I872 Venous insufficiency (chronic) (peripheral): Secondary | ICD-10-CM | POA: Diagnosis not present

## 2018-08-22 DIAGNOSIS — I890 Lymphedema, not elsewhere classified: Secondary | ICD-10-CM | POA: Diagnosis not present

## 2018-08-22 DIAGNOSIS — R634 Abnormal weight loss: Secondary | ICD-10-CM | POA: Diagnosis not present

## 2018-08-23 DIAGNOSIS — R634 Abnormal weight loss: Secondary | ICD-10-CM | POA: Diagnosis not present

## 2018-08-23 DIAGNOSIS — R5381 Other malaise: Secondary | ICD-10-CM | POA: Diagnosis not present

## 2018-08-23 DIAGNOSIS — R600 Localized edema: Secondary | ICD-10-CM | POA: Diagnosis not present

## 2018-08-23 DIAGNOSIS — I10 Essential (primary) hypertension: Secondary | ICD-10-CM | POA: Diagnosis not present

## 2018-08-23 DIAGNOSIS — I872 Venous insufficiency (chronic) (peripheral): Secondary | ICD-10-CM | POA: Diagnosis not present

## 2018-08-23 DIAGNOSIS — I890 Lymphedema, not elsewhere classified: Secondary | ICD-10-CM | POA: Diagnosis not present

## 2018-08-23 DIAGNOSIS — L97819 Non-pressure chronic ulcer of other part of right lower leg with unspecified severity: Secondary | ICD-10-CM | POA: Diagnosis not present

## 2018-08-23 DIAGNOSIS — L89153 Pressure ulcer of sacral region, stage 3: Secondary | ICD-10-CM | POA: Diagnosis not present

## 2018-08-23 DIAGNOSIS — I70299 Other atherosclerosis of native arteries of extremities, unspecified extremity: Secondary | ICD-10-CM | POA: Diagnosis not present

## 2018-08-24 DIAGNOSIS — L89153 Pressure ulcer of sacral region, stage 3: Secondary | ICD-10-CM | POA: Diagnosis not present

## 2018-08-24 DIAGNOSIS — I890 Lymphedema, not elsewhere classified: Secondary | ICD-10-CM | POA: Diagnosis not present

## 2018-08-24 DIAGNOSIS — I872 Venous insufficiency (chronic) (peripheral): Secondary | ICD-10-CM | POA: Diagnosis not present

## 2018-08-24 DIAGNOSIS — I70299 Other atherosclerosis of native arteries of extremities, unspecified extremity: Secondary | ICD-10-CM | POA: Diagnosis not present

## 2018-08-24 DIAGNOSIS — R634 Abnormal weight loss: Secondary | ICD-10-CM | POA: Diagnosis not present

## 2018-08-24 DIAGNOSIS — L97819 Non-pressure chronic ulcer of other part of right lower leg with unspecified severity: Secondary | ICD-10-CM | POA: Diagnosis not present

## 2018-08-26 DIAGNOSIS — R634 Abnormal weight loss: Secondary | ICD-10-CM | POA: Diagnosis not present

## 2018-08-26 DIAGNOSIS — L97819 Non-pressure chronic ulcer of other part of right lower leg with unspecified severity: Secondary | ICD-10-CM | POA: Diagnosis not present

## 2018-08-26 DIAGNOSIS — L89153 Pressure ulcer of sacral region, stage 3: Secondary | ICD-10-CM | POA: Diagnosis not present

## 2018-08-26 DIAGNOSIS — I890 Lymphedema, not elsewhere classified: Secondary | ICD-10-CM | POA: Diagnosis not present

## 2018-08-26 DIAGNOSIS — I70299 Other atherosclerosis of native arteries of extremities, unspecified extremity: Secondary | ICD-10-CM | POA: Diagnosis not present

## 2018-08-26 DIAGNOSIS — I872 Venous insufficiency (chronic) (peripheral): Secondary | ICD-10-CM | POA: Diagnosis not present

## 2018-08-27 DIAGNOSIS — I890 Lymphedema, not elsewhere classified: Secondary | ICD-10-CM | POA: Diagnosis not present

## 2018-08-27 DIAGNOSIS — L89153 Pressure ulcer of sacral region, stage 3: Secondary | ICD-10-CM | POA: Diagnosis not present

## 2018-08-27 DIAGNOSIS — L97819 Non-pressure chronic ulcer of other part of right lower leg with unspecified severity: Secondary | ICD-10-CM | POA: Diagnosis not present

## 2018-08-27 DIAGNOSIS — R634 Abnormal weight loss: Secondary | ICD-10-CM | POA: Diagnosis not present

## 2018-08-27 DIAGNOSIS — I872 Venous insufficiency (chronic) (peripheral): Secondary | ICD-10-CM | POA: Diagnosis not present

## 2018-08-27 DIAGNOSIS — I70299 Other atherosclerosis of native arteries of extremities, unspecified extremity: Secondary | ICD-10-CM | POA: Diagnosis not present

## 2018-08-28 DIAGNOSIS — I890 Lymphedema, not elsewhere classified: Secondary | ICD-10-CM | POA: Diagnosis not present

## 2018-08-28 DIAGNOSIS — I872 Venous insufficiency (chronic) (peripheral): Secondary | ICD-10-CM | POA: Diagnosis not present

## 2018-08-28 DIAGNOSIS — L97819 Non-pressure chronic ulcer of other part of right lower leg with unspecified severity: Secondary | ICD-10-CM | POA: Diagnosis not present

## 2018-08-28 DIAGNOSIS — I70299 Other atherosclerosis of native arteries of extremities, unspecified extremity: Secondary | ICD-10-CM | POA: Diagnosis not present

## 2018-08-28 DIAGNOSIS — L89153 Pressure ulcer of sacral region, stage 3: Secondary | ICD-10-CM | POA: Diagnosis not present

## 2018-08-28 DIAGNOSIS — R634 Abnormal weight loss: Secondary | ICD-10-CM | POA: Diagnosis not present

## 2018-08-29 DIAGNOSIS — I890 Lymphedema, not elsewhere classified: Secondary | ICD-10-CM | POA: Diagnosis not present

## 2018-08-29 DIAGNOSIS — I70299 Other atherosclerosis of native arteries of extremities, unspecified extremity: Secondary | ICD-10-CM | POA: Diagnosis not present

## 2018-08-29 DIAGNOSIS — I872 Venous insufficiency (chronic) (peripheral): Secondary | ICD-10-CM | POA: Diagnosis not present

## 2018-08-29 DIAGNOSIS — L97819 Non-pressure chronic ulcer of other part of right lower leg with unspecified severity: Secondary | ICD-10-CM | POA: Diagnosis not present

## 2018-08-29 DIAGNOSIS — R634 Abnormal weight loss: Secondary | ICD-10-CM | POA: Diagnosis not present

## 2018-08-29 DIAGNOSIS — L89153 Pressure ulcer of sacral region, stage 3: Secondary | ICD-10-CM | POA: Diagnosis not present

## 2018-08-30 DIAGNOSIS — I70299 Other atherosclerosis of native arteries of extremities, unspecified extremity: Secondary | ICD-10-CM | POA: Diagnosis not present

## 2018-08-30 DIAGNOSIS — R634 Abnormal weight loss: Secondary | ICD-10-CM | POA: Diagnosis not present

## 2018-08-30 DIAGNOSIS — L89153 Pressure ulcer of sacral region, stage 3: Secondary | ICD-10-CM | POA: Diagnosis not present

## 2018-08-30 DIAGNOSIS — I890 Lymphedema, not elsewhere classified: Secondary | ICD-10-CM | POA: Diagnosis not present

## 2018-08-30 DIAGNOSIS — I872 Venous insufficiency (chronic) (peripheral): Secondary | ICD-10-CM | POA: Diagnosis not present

## 2018-08-30 DIAGNOSIS — L97819 Non-pressure chronic ulcer of other part of right lower leg with unspecified severity: Secondary | ICD-10-CM | POA: Diagnosis not present

## 2018-08-31 DIAGNOSIS — L89153 Pressure ulcer of sacral region, stage 3: Secondary | ICD-10-CM | POA: Diagnosis not present

## 2018-08-31 DIAGNOSIS — L97819 Non-pressure chronic ulcer of other part of right lower leg with unspecified severity: Secondary | ICD-10-CM | POA: Diagnosis not present

## 2018-08-31 DIAGNOSIS — R634 Abnormal weight loss: Secondary | ICD-10-CM | POA: Diagnosis not present

## 2018-08-31 DIAGNOSIS — I872 Venous insufficiency (chronic) (peripheral): Secondary | ICD-10-CM | POA: Diagnosis not present

## 2018-08-31 DIAGNOSIS — I70299 Other atherosclerosis of native arteries of extremities, unspecified extremity: Secondary | ICD-10-CM | POA: Diagnosis not present

## 2018-08-31 DIAGNOSIS — I890 Lymphedema, not elsewhere classified: Secondary | ICD-10-CM | POA: Diagnosis not present

## 2018-09-02 DIAGNOSIS — I890 Lymphedema, not elsewhere classified: Secondary | ICD-10-CM | POA: Diagnosis not present

## 2018-09-02 DIAGNOSIS — L89153 Pressure ulcer of sacral region, stage 3: Secondary | ICD-10-CM | POA: Diagnosis not present

## 2018-09-02 DIAGNOSIS — I872 Venous insufficiency (chronic) (peripheral): Secondary | ICD-10-CM | POA: Diagnosis not present

## 2018-09-02 DIAGNOSIS — I70299 Other atherosclerosis of native arteries of extremities, unspecified extremity: Secondary | ICD-10-CM | POA: Diagnosis not present

## 2018-09-02 DIAGNOSIS — R634 Abnormal weight loss: Secondary | ICD-10-CM | POA: Diagnosis not present

## 2018-09-02 DIAGNOSIS — L97819 Non-pressure chronic ulcer of other part of right lower leg with unspecified severity: Secondary | ICD-10-CM | POA: Diagnosis not present

## 2018-09-04 DIAGNOSIS — R634 Abnormal weight loss: Secondary | ICD-10-CM | POA: Diagnosis not present

## 2018-09-04 DIAGNOSIS — I890 Lymphedema, not elsewhere classified: Secondary | ICD-10-CM | POA: Diagnosis not present

## 2018-09-04 DIAGNOSIS — I872 Venous insufficiency (chronic) (peripheral): Secondary | ICD-10-CM | POA: Diagnosis not present

## 2018-09-04 DIAGNOSIS — L89153 Pressure ulcer of sacral region, stage 3: Secondary | ICD-10-CM | POA: Diagnosis not present

## 2018-09-04 DIAGNOSIS — L97819 Non-pressure chronic ulcer of other part of right lower leg with unspecified severity: Secondary | ICD-10-CM | POA: Diagnosis not present

## 2018-09-04 DIAGNOSIS — I70299 Other atherosclerosis of native arteries of extremities, unspecified extremity: Secondary | ICD-10-CM | POA: Diagnosis not present

## 2018-09-05 DIAGNOSIS — I872 Venous insufficiency (chronic) (peripheral): Secondary | ICD-10-CM | POA: Diagnosis not present

## 2018-09-05 DIAGNOSIS — I70299 Other atherosclerosis of native arteries of extremities, unspecified extremity: Secondary | ICD-10-CM | POA: Diagnosis not present

## 2018-09-05 DIAGNOSIS — L89153 Pressure ulcer of sacral region, stage 3: Secondary | ICD-10-CM | POA: Diagnosis not present

## 2018-09-05 DIAGNOSIS — L97819 Non-pressure chronic ulcer of other part of right lower leg with unspecified severity: Secondary | ICD-10-CM | POA: Diagnosis not present

## 2018-09-05 DIAGNOSIS — R634 Abnormal weight loss: Secondary | ICD-10-CM | POA: Diagnosis not present

## 2018-09-05 DIAGNOSIS — I890 Lymphedema, not elsewhere classified: Secondary | ICD-10-CM | POA: Diagnosis not present

## 2018-09-06 DIAGNOSIS — R634 Abnormal weight loss: Secondary | ICD-10-CM | POA: Diagnosis not present

## 2018-09-06 DIAGNOSIS — L89153 Pressure ulcer of sacral region, stage 3: Secondary | ICD-10-CM | POA: Diagnosis not present

## 2018-09-06 DIAGNOSIS — L97819 Non-pressure chronic ulcer of other part of right lower leg with unspecified severity: Secondary | ICD-10-CM | POA: Diagnosis not present

## 2018-09-06 DIAGNOSIS — I890 Lymphedema, not elsewhere classified: Secondary | ICD-10-CM | POA: Diagnosis not present

## 2018-09-06 DIAGNOSIS — I872 Venous insufficiency (chronic) (peripheral): Secondary | ICD-10-CM | POA: Diagnosis not present

## 2018-09-06 DIAGNOSIS — I70299 Other atherosclerosis of native arteries of extremities, unspecified extremity: Secondary | ICD-10-CM | POA: Diagnosis not present

## 2018-09-07 DIAGNOSIS — I890 Lymphedema, not elsewhere classified: Secondary | ICD-10-CM | POA: Diagnosis not present

## 2018-09-07 DIAGNOSIS — L97819 Non-pressure chronic ulcer of other part of right lower leg with unspecified severity: Secondary | ICD-10-CM | POA: Diagnosis not present

## 2018-09-07 DIAGNOSIS — I70299 Other atherosclerosis of native arteries of extremities, unspecified extremity: Secondary | ICD-10-CM | POA: Diagnosis not present

## 2018-09-07 DIAGNOSIS — R634 Abnormal weight loss: Secondary | ICD-10-CM | POA: Diagnosis not present

## 2018-09-07 DIAGNOSIS — I872 Venous insufficiency (chronic) (peripheral): Secondary | ICD-10-CM | POA: Diagnosis not present

## 2018-09-07 DIAGNOSIS — L89153 Pressure ulcer of sacral region, stage 3: Secondary | ICD-10-CM | POA: Diagnosis not present

## 2018-09-09 DIAGNOSIS — I70299 Other atherosclerosis of native arteries of extremities, unspecified extremity: Secondary | ICD-10-CM | POA: Diagnosis not present

## 2018-09-09 DIAGNOSIS — I872 Venous insufficiency (chronic) (peripheral): Secondary | ICD-10-CM | POA: Diagnosis not present

## 2018-09-09 DIAGNOSIS — I890 Lymphedema, not elsewhere classified: Secondary | ICD-10-CM | POA: Diagnosis not present

## 2018-09-09 DIAGNOSIS — R634 Abnormal weight loss: Secondary | ICD-10-CM | POA: Diagnosis not present

## 2018-09-09 DIAGNOSIS — L97819 Non-pressure chronic ulcer of other part of right lower leg with unspecified severity: Secondary | ICD-10-CM | POA: Diagnosis not present

## 2018-09-09 DIAGNOSIS — L89153 Pressure ulcer of sacral region, stage 3: Secondary | ICD-10-CM | POA: Diagnosis not present

## 2018-09-10 DIAGNOSIS — L89153 Pressure ulcer of sacral region, stage 3: Secondary | ICD-10-CM | POA: Diagnosis not present

## 2018-09-10 DIAGNOSIS — L97819 Non-pressure chronic ulcer of other part of right lower leg with unspecified severity: Secondary | ICD-10-CM | POA: Diagnosis not present

## 2018-09-10 DIAGNOSIS — I890 Lymphedema, not elsewhere classified: Secondary | ICD-10-CM | POA: Diagnosis not present

## 2018-09-10 DIAGNOSIS — R634 Abnormal weight loss: Secondary | ICD-10-CM | POA: Diagnosis not present

## 2018-09-10 DIAGNOSIS — I70299 Other atherosclerosis of native arteries of extremities, unspecified extremity: Secondary | ICD-10-CM | POA: Diagnosis not present

## 2018-09-10 DIAGNOSIS — I872 Venous insufficiency (chronic) (peripheral): Secondary | ICD-10-CM | POA: Diagnosis not present

## 2018-09-11 DIAGNOSIS — I890 Lymphedema, not elsewhere classified: Secondary | ICD-10-CM | POA: Diagnosis not present

## 2018-09-11 DIAGNOSIS — R634 Abnormal weight loss: Secondary | ICD-10-CM | POA: Diagnosis not present

## 2018-09-11 DIAGNOSIS — L97819 Non-pressure chronic ulcer of other part of right lower leg with unspecified severity: Secondary | ICD-10-CM | POA: Diagnosis not present

## 2018-09-11 DIAGNOSIS — L89153 Pressure ulcer of sacral region, stage 3: Secondary | ICD-10-CM | POA: Diagnosis not present

## 2018-09-11 DIAGNOSIS — I872 Venous insufficiency (chronic) (peripheral): Secondary | ICD-10-CM | POA: Diagnosis not present

## 2018-09-11 DIAGNOSIS — I70299 Other atherosclerosis of native arteries of extremities, unspecified extremity: Secondary | ICD-10-CM | POA: Diagnosis not present

## 2018-09-12 DIAGNOSIS — L97819 Non-pressure chronic ulcer of other part of right lower leg with unspecified severity: Secondary | ICD-10-CM | POA: Diagnosis not present

## 2018-09-12 DIAGNOSIS — I872 Venous insufficiency (chronic) (peripheral): Secondary | ICD-10-CM | POA: Diagnosis not present

## 2018-09-12 DIAGNOSIS — I890 Lymphedema, not elsewhere classified: Secondary | ICD-10-CM | POA: Diagnosis not present

## 2018-09-12 DIAGNOSIS — R634 Abnormal weight loss: Secondary | ICD-10-CM | POA: Diagnosis not present

## 2018-09-12 DIAGNOSIS — L89153 Pressure ulcer of sacral region, stage 3: Secondary | ICD-10-CM | POA: Diagnosis not present

## 2018-09-12 DIAGNOSIS — I70299 Other atherosclerosis of native arteries of extremities, unspecified extremity: Secondary | ICD-10-CM | POA: Diagnosis not present

## 2018-09-13 DIAGNOSIS — I890 Lymphedema, not elsewhere classified: Secondary | ICD-10-CM | POA: Diagnosis not present

## 2018-09-13 DIAGNOSIS — R634 Abnormal weight loss: Secondary | ICD-10-CM | POA: Diagnosis not present

## 2018-09-13 DIAGNOSIS — L89153 Pressure ulcer of sacral region, stage 3: Secondary | ICD-10-CM | POA: Diagnosis not present

## 2018-09-13 DIAGNOSIS — I70299 Other atherosclerosis of native arteries of extremities, unspecified extremity: Secondary | ICD-10-CM | POA: Diagnosis not present

## 2018-09-13 DIAGNOSIS — L97819 Non-pressure chronic ulcer of other part of right lower leg with unspecified severity: Secondary | ICD-10-CM | POA: Diagnosis not present

## 2018-09-13 DIAGNOSIS — I872 Venous insufficiency (chronic) (peripheral): Secondary | ICD-10-CM | POA: Diagnosis not present

## 2018-09-14 DIAGNOSIS — L97819 Non-pressure chronic ulcer of other part of right lower leg with unspecified severity: Secondary | ICD-10-CM | POA: Diagnosis not present

## 2018-09-14 DIAGNOSIS — I70299 Other atherosclerosis of native arteries of extremities, unspecified extremity: Secondary | ICD-10-CM | POA: Diagnosis not present

## 2018-09-14 DIAGNOSIS — L89153 Pressure ulcer of sacral region, stage 3: Secondary | ICD-10-CM | POA: Diagnosis not present

## 2018-09-14 DIAGNOSIS — I872 Venous insufficiency (chronic) (peripheral): Secondary | ICD-10-CM | POA: Diagnosis not present

## 2018-09-14 DIAGNOSIS — R634 Abnormal weight loss: Secondary | ICD-10-CM | POA: Diagnosis not present

## 2018-09-14 DIAGNOSIS — I890 Lymphedema, not elsewhere classified: Secondary | ICD-10-CM | POA: Diagnosis not present

## 2018-09-16 DIAGNOSIS — I70299 Other atherosclerosis of native arteries of extremities, unspecified extremity: Secondary | ICD-10-CM | POA: Diagnosis not present

## 2018-09-16 DIAGNOSIS — L89153 Pressure ulcer of sacral region, stage 3: Secondary | ICD-10-CM | POA: Diagnosis not present

## 2018-09-16 DIAGNOSIS — I890 Lymphedema, not elsewhere classified: Secondary | ICD-10-CM | POA: Diagnosis not present

## 2018-09-16 DIAGNOSIS — R634 Abnormal weight loss: Secondary | ICD-10-CM | POA: Diagnosis not present

## 2018-09-16 DIAGNOSIS — I872 Venous insufficiency (chronic) (peripheral): Secondary | ICD-10-CM | POA: Diagnosis not present

## 2018-09-16 DIAGNOSIS — L97819 Non-pressure chronic ulcer of other part of right lower leg with unspecified severity: Secondary | ICD-10-CM | POA: Diagnosis not present

## 2018-09-17 DIAGNOSIS — L89153 Pressure ulcer of sacral region, stage 3: Secondary | ICD-10-CM | POA: Diagnosis not present

## 2018-09-17 DIAGNOSIS — I70299 Other atherosclerosis of native arteries of extremities, unspecified extremity: Secondary | ICD-10-CM | POA: Diagnosis not present

## 2018-09-17 DIAGNOSIS — I872 Venous insufficiency (chronic) (peripheral): Secondary | ICD-10-CM | POA: Diagnosis not present

## 2018-09-17 DIAGNOSIS — L97819 Non-pressure chronic ulcer of other part of right lower leg with unspecified severity: Secondary | ICD-10-CM | POA: Diagnosis not present

## 2018-09-17 DIAGNOSIS — R634 Abnormal weight loss: Secondary | ICD-10-CM | POA: Diagnosis not present

## 2018-09-17 DIAGNOSIS — I890 Lymphedema, not elsewhere classified: Secondary | ICD-10-CM | POA: Diagnosis not present

## 2018-09-23 DIAGNOSIS — L89153 Pressure ulcer of sacral region, stage 3: Secondary | ICD-10-CM | POA: Diagnosis not present

## 2018-09-23 DIAGNOSIS — L97819 Non-pressure chronic ulcer of other part of right lower leg with unspecified severity: Secondary | ICD-10-CM | POA: Diagnosis not present

## 2018-09-23 DIAGNOSIS — I872 Venous insufficiency (chronic) (peripheral): Secondary | ICD-10-CM | POA: Diagnosis not present

## 2018-09-23 DIAGNOSIS — R5381 Other malaise: Secondary | ICD-10-CM | POA: Diagnosis not present

## 2018-09-23 DIAGNOSIS — I70299 Other atherosclerosis of native arteries of extremities, unspecified extremity: Secondary | ICD-10-CM | POA: Diagnosis not present

## 2018-09-23 DIAGNOSIS — R600 Localized edema: Secondary | ICD-10-CM | POA: Diagnosis not present

## 2018-09-23 DIAGNOSIS — R634 Abnormal weight loss: Secondary | ICD-10-CM | POA: Diagnosis not present

## 2018-09-23 DIAGNOSIS — I10 Essential (primary) hypertension: Secondary | ICD-10-CM | POA: Diagnosis not present

## 2018-09-23 DIAGNOSIS — I890 Lymphedema, not elsewhere classified: Secondary | ICD-10-CM | POA: Diagnosis not present

## 2018-09-24 DIAGNOSIS — I890 Lymphedema, not elsewhere classified: Secondary | ICD-10-CM | POA: Diagnosis not present

## 2018-09-24 DIAGNOSIS — L97819 Non-pressure chronic ulcer of other part of right lower leg with unspecified severity: Secondary | ICD-10-CM | POA: Diagnosis not present

## 2018-09-24 DIAGNOSIS — L89153 Pressure ulcer of sacral region, stage 3: Secondary | ICD-10-CM | POA: Diagnosis not present

## 2018-09-24 DIAGNOSIS — I872 Venous insufficiency (chronic) (peripheral): Secondary | ICD-10-CM | POA: Diagnosis not present

## 2018-09-24 DIAGNOSIS — R634 Abnormal weight loss: Secondary | ICD-10-CM | POA: Diagnosis not present

## 2018-09-24 DIAGNOSIS — I70299 Other atherosclerosis of native arteries of extremities, unspecified extremity: Secondary | ICD-10-CM | POA: Diagnosis not present

## 2018-09-26 DIAGNOSIS — L89153 Pressure ulcer of sacral region, stage 3: Secondary | ICD-10-CM | POA: Diagnosis not present

## 2018-09-26 DIAGNOSIS — I890 Lymphedema, not elsewhere classified: Secondary | ICD-10-CM | POA: Diagnosis not present

## 2018-09-26 DIAGNOSIS — R634 Abnormal weight loss: Secondary | ICD-10-CM | POA: Diagnosis not present

## 2018-09-26 DIAGNOSIS — I872 Venous insufficiency (chronic) (peripheral): Secondary | ICD-10-CM | POA: Diagnosis not present

## 2018-09-26 DIAGNOSIS — L97819 Non-pressure chronic ulcer of other part of right lower leg with unspecified severity: Secondary | ICD-10-CM | POA: Diagnosis not present

## 2018-09-26 DIAGNOSIS — I70299 Other atherosclerosis of native arteries of extremities, unspecified extremity: Secondary | ICD-10-CM | POA: Diagnosis not present

## 2018-09-27 DIAGNOSIS — I872 Venous insufficiency (chronic) (peripheral): Secondary | ICD-10-CM | POA: Diagnosis not present

## 2018-09-27 DIAGNOSIS — I70299 Other atherosclerosis of native arteries of extremities, unspecified extremity: Secondary | ICD-10-CM | POA: Diagnosis not present

## 2018-09-27 DIAGNOSIS — I890 Lymphedema, not elsewhere classified: Secondary | ICD-10-CM | POA: Diagnosis not present

## 2018-09-27 DIAGNOSIS — R634 Abnormal weight loss: Secondary | ICD-10-CM | POA: Diagnosis not present

## 2018-09-27 DIAGNOSIS — L89153 Pressure ulcer of sacral region, stage 3: Secondary | ICD-10-CM | POA: Diagnosis not present

## 2018-09-27 DIAGNOSIS — L97819 Non-pressure chronic ulcer of other part of right lower leg with unspecified severity: Secondary | ICD-10-CM | POA: Diagnosis not present

## 2018-09-30 DIAGNOSIS — R634 Abnormal weight loss: Secondary | ICD-10-CM | POA: Diagnosis not present

## 2018-09-30 DIAGNOSIS — I890 Lymphedema, not elsewhere classified: Secondary | ICD-10-CM | POA: Diagnosis not present

## 2018-09-30 DIAGNOSIS — L89153 Pressure ulcer of sacral region, stage 3: Secondary | ICD-10-CM | POA: Diagnosis not present

## 2018-09-30 DIAGNOSIS — I70299 Other atherosclerosis of native arteries of extremities, unspecified extremity: Secondary | ICD-10-CM | POA: Diagnosis not present

## 2018-09-30 DIAGNOSIS — I872 Venous insufficiency (chronic) (peripheral): Secondary | ICD-10-CM | POA: Diagnosis not present

## 2018-09-30 DIAGNOSIS — L97819 Non-pressure chronic ulcer of other part of right lower leg with unspecified severity: Secondary | ICD-10-CM | POA: Diagnosis not present

## 2018-10-01 DIAGNOSIS — L97819 Non-pressure chronic ulcer of other part of right lower leg with unspecified severity: Secondary | ICD-10-CM | POA: Diagnosis not present

## 2018-10-01 DIAGNOSIS — L89153 Pressure ulcer of sacral region, stage 3: Secondary | ICD-10-CM | POA: Diagnosis not present

## 2018-10-01 DIAGNOSIS — R634 Abnormal weight loss: Secondary | ICD-10-CM | POA: Diagnosis not present

## 2018-10-01 DIAGNOSIS — I872 Venous insufficiency (chronic) (peripheral): Secondary | ICD-10-CM | POA: Diagnosis not present

## 2018-10-01 DIAGNOSIS — I890 Lymphedema, not elsewhere classified: Secondary | ICD-10-CM | POA: Diagnosis not present

## 2018-10-01 DIAGNOSIS — I70299 Other atherosclerosis of native arteries of extremities, unspecified extremity: Secondary | ICD-10-CM | POA: Diagnosis not present

## 2018-10-02 DIAGNOSIS — I70299 Other atherosclerosis of native arteries of extremities, unspecified extremity: Secondary | ICD-10-CM | POA: Diagnosis not present

## 2018-10-02 DIAGNOSIS — L89153 Pressure ulcer of sacral region, stage 3: Secondary | ICD-10-CM | POA: Diagnosis not present

## 2018-10-02 DIAGNOSIS — R634 Abnormal weight loss: Secondary | ICD-10-CM | POA: Diagnosis not present

## 2018-10-02 DIAGNOSIS — L97819 Non-pressure chronic ulcer of other part of right lower leg with unspecified severity: Secondary | ICD-10-CM | POA: Diagnosis not present

## 2018-10-02 DIAGNOSIS — I872 Venous insufficiency (chronic) (peripheral): Secondary | ICD-10-CM | POA: Diagnosis not present

## 2018-10-02 DIAGNOSIS — I890 Lymphedema, not elsewhere classified: Secondary | ICD-10-CM | POA: Diagnosis not present

## 2018-10-03 DIAGNOSIS — R634 Abnormal weight loss: Secondary | ICD-10-CM | POA: Diagnosis not present

## 2018-10-03 DIAGNOSIS — I70299 Other atherosclerosis of native arteries of extremities, unspecified extremity: Secondary | ICD-10-CM | POA: Diagnosis not present

## 2018-10-03 DIAGNOSIS — L97819 Non-pressure chronic ulcer of other part of right lower leg with unspecified severity: Secondary | ICD-10-CM | POA: Diagnosis not present

## 2018-10-03 DIAGNOSIS — I872 Venous insufficiency (chronic) (peripheral): Secondary | ICD-10-CM | POA: Diagnosis not present

## 2018-10-03 DIAGNOSIS — L89153 Pressure ulcer of sacral region, stage 3: Secondary | ICD-10-CM | POA: Diagnosis not present

## 2018-10-03 DIAGNOSIS — I890 Lymphedema, not elsewhere classified: Secondary | ICD-10-CM | POA: Diagnosis not present

## 2018-10-04 DIAGNOSIS — L97819 Non-pressure chronic ulcer of other part of right lower leg with unspecified severity: Secondary | ICD-10-CM | POA: Diagnosis not present

## 2018-10-04 DIAGNOSIS — I70299 Other atherosclerosis of native arteries of extremities, unspecified extremity: Secondary | ICD-10-CM | POA: Diagnosis not present

## 2018-10-04 DIAGNOSIS — I872 Venous insufficiency (chronic) (peripheral): Secondary | ICD-10-CM | POA: Diagnosis not present

## 2018-10-04 DIAGNOSIS — L89153 Pressure ulcer of sacral region, stage 3: Secondary | ICD-10-CM | POA: Diagnosis not present

## 2018-10-04 DIAGNOSIS — R634 Abnormal weight loss: Secondary | ICD-10-CM | POA: Diagnosis not present

## 2018-10-04 DIAGNOSIS — I890 Lymphedema, not elsewhere classified: Secondary | ICD-10-CM | POA: Diagnosis not present

## 2018-10-08 DIAGNOSIS — I70299 Other atherosclerosis of native arteries of extremities, unspecified extremity: Secondary | ICD-10-CM | POA: Diagnosis not present

## 2018-10-08 DIAGNOSIS — L89153 Pressure ulcer of sacral region, stage 3: Secondary | ICD-10-CM | POA: Diagnosis not present

## 2018-10-08 DIAGNOSIS — L97819 Non-pressure chronic ulcer of other part of right lower leg with unspecified severity: Secondary | ICD-10-CM | POA: Diagnosis not present

## 2018-10-08 DIAGNOSIS — R634 Abnormal weight loss: Secondary | ICD-10-CM | POA: Diagnosis not present

## 2018-10-08 DIAGNOSIS — I890 Lymphedema, not elsewhere classified: Secondary | ICD-10-CM | POA: Diagnosis not present

## 2018-10-08 DIAGNOSIS — I872 Venous insufficiency (chronic) (peripheral): Secondary | ICD-10-CM | POA: Diagnosis not present

## 2018-10-10 DIAGNOSIS — I890 Lymphedema, not elsewhere classified: Secondary | ICD-10-CM | POA: Diagnosis not present

## 2018-10-10 DIAGNOSIS — I70299 Other atherosclerosis of native arteries of extremities, unspecified extremity: Secondary | ICD-10-CM | POA: Diagnosis not present

## 2018-10-10 DIAGNOSIS — L97819 Non-pressure chronic ulcer of other part of right lower leg with unspecified severity: Secondary | ICD-10-CM | POA: Diagnosis not present

## 2018-10-10 DIAGNOSIS — L89153 Pressure ulcer of sacral region, stage 3: Secondary | ICD-10-CM | POA: Diagnosis not present

## 2018-10-10 DIAGNOSIS — I872 Venous insufficiency (chronic) (peripheral): Secondary | ICD-10-CM | POA: Diagnosis not present

## 2018-10-10 DIAGNOSIS — R634 Abnormal weight loss: Secondary | ICD-10-CM | POA: Diagnosis not present

## 2018-10-11 DIAGNOSIS — I872 Venous insufficiency (chronic) (peripheral): Secondary | ICD-10-CM | POA: Diagnosis not present

## 2018-10-11 DIAGNOSIS — I70299 Other atherosclerosis of native arteries of extremities, unspecified extremity: Secondary | ICD-10-CM | POA: Diagnosis not present

## 2018-10-11 DIAGNOSIS — L97819 Non-pressure chronic ulcer of other part of right lower leg with unspecified severity: Secondary | ICD-10-CM | POA: Diagnosis not present

## 2018-10-11 DIAGNOSIS — L89153 Pressure ulcer of sacral region, stage 3: Secondary | ICD-10-CM | POA: Diagnosis not present

## 2018-10-11 DIAGNOSIS — R634 Abnormal weight loss: Secondary | ICD-10-CM | POA: Diagnosis not present

## 2018-10-11 DIAGNOSIS — I890 Lymphedema, not elsewhere classified: Secondary | ICD-10-CM | POA: Diagnosis not present

## 2018-10-15 DIAGNOSIS — L97819 Non-pressure chronic ulcer of other part of right lower leg with unspecified severity: Secondary | ICD-10-CM | POA: Diagnosis not present

## 2018-10-15 DIAGNOSIS — L89153 Pressure ulcer of sacral region, stage 3: Secondary | ICD-10-CM | POA: Diagnosis not present

## 2018-10-15 DIAGNOSIS — R634 Abnormal weight loss: Secondary | ICD-10-CM | POA: Diagnosis not present

## 2018-10-15 DIAGNOSIS — I70299 Other atherosclerosis of native arteries of extremities, unspecified extremity: Secondary | ICD-10-CM | POA: Diagnosis not present

## 2018-10-15 DIAGNOSIS — I872 Venous insufficiency (chronic) (peripheral): Secondary | ICD-10-CM | POA: Diagnosis not present

## 2018-10-15 DIAGNOSIS — I890 Lymphedema, not elsewhere classified: Secondary | ICD-10-CM | POA: Diagnosis not present

## 2018-10-17 DIAGNOSIS — I872 Venous insufficiency (chronic) (peripheral): Secondary | ICD-10-CM | POA: Diagnosis not present

## 2018-10-17 DIAGNOSIS — L89153 Pressure ulcer of sacral region, stage 3: Secondary | ICD-10-CM | POA: Diagnosis not present

## 2018-10-17 DIAGNOSIS — I890 Lymphedema, not elsewhere classified: Secondary | ICD-10-CM | POA: Diagnosis not present

## 2018-10-17 DIAGNOSIS — I70299 Other atherosclerosis of native arteries of extremities, unspecified extremity: Secondary | ICD-10-CM | POA: Diagnosis not present

## 2018-10-17 DIAGNOSIS — R634 Abnormal weight loss: Secondary | ICD-10-CM | POA: Diagnosis not present

## 2018-10-17 DIAGNOSIS — L97819 Non-pressure chronic ulcer of other part of right lower leg with unspecified severity: Secondary | ICD-10-CM | POA: Diagnosis not present

## 2018-10-18 DIAGNOSIS — I70299 Other atherosclerosis of native arteries of extremities, unspecified extremity: Secondary | ICD-10-CM | POA: Diagnosis not present

## 2018-10-18 DIAGNOSIS — R634 Abnormal weight loss: Secondary | ICD-10-CM | POA: Diagnosis not present

## 2018-10-18 DIAGNOSIS — L89153 Pressure ulcer of sacral region, stage 3: Secondary | ICD-10-CM | POA: Diagnosis not present

## 2018-10-18 DIAGNOSIS — L97819 Non-pressure chronic ulcer of other part of right lower leg with unspecified severity: Secondary | ICD-10-CM | POA: Diagnosis not present

## 2018-10-18 DIAGNOSIS — I890 Lymphedema, not elsewhere classified: Secondary | ICD-10-CM | POA: Diagnosis not present

## 2018-10-18 DIAGNOSIS — I872 Venous insufficiency (chronic) (peripheral): Secondary | ICD-10-CM | POA: Diagnosis not present

## 2018-10-21 DIAGNOSIS — I70299 Other atherosclerosis of native arteries of extremities, unspecified extremity: Secondary | ICD-10-CM | POA: Diagnosis not present

## 2018-10-21 DIAGNOSIS — L89153 Pressure ulcer of sacral region, stage 3: Secondary | ICD-10-CM | POA: Diagnosis not present

## 2018-10-21 DIAGNOSIS — I872 Venous insufficiency (chronic) (peripheral): Secondary | ICD-10-CM | POA: Diagnosis not present

## 2018-10-21 DIAGNOSIS — R634 Abnormal weight loss: Secondary | ICD-10-CM | POA: Diagnosis not present

## 2018-10-21 DIAGNOSIS — L97819 Non-pressure chronic ulcer of other part of right lower leg with unspecified severity: Secondary | ICD-10-CM | POA: Diagnosis not present

## 2018-10-21 DIAGNOSIS — I890 Lymphedema, not elsewhere classified: Secondary | ICD-10-CM | POA: Diagnosis not present

## 2018-10-22 DIAGNOSIS — L97819 Non-pressure chronic ulcer of other part of right lower leg with unspecified severity: Secondary | ICD-10-CM | POA: Diagnosis not present

## 2018-10-22 DIAGNOSIS — R634 Abnormal weight loss: Secondary | ICD-10-CM | POA: Diagnosis not present

## 2018-10-22 DIAGNOSIS — L89153 Pressure ulcer of sacral region, stage 3: Secondary | ICD-10-CM | POA: Diagnosis not present

## 2018-10-22 DIAGNOSIS — I70299 Other atherosclerosis of native arteries of extremities, unspecified extremity: Secondary | ICD-10-CM | POA: Diagnosis not present

## 2018-10-22 DIAGNOSIS — I890 Lymphedema, not elsewhere classified: Secondary | ICD-10-CM | POA: Diagnosis not present

## 2018-10-22 DIAGNOSIS — I872 Venous insufficiency (chronic) (peripheral): Secondary | ICD-10-CM | POA: Diagnosis not present

## 2018-10-23 DIAGNOSIS — L97819 Non-pressure chronic ulcer of other part of right lower leg with unspecified severity: Secondary | ICD-10-CM | POA: Diagnosis not present

## 2018-10-23 DIAGNOSIS — R5381 Other malaise: Secondary | ICD-10-CM | POA: Diagnosis not present

## 2018-10-23 DIAGNOSIS — I872 Venous insufficiency (chronic) (peripheral): Secondary | ICD-10-CM | POA: Diagnosis not present

## 2018-10-23 DIAGNOSIS — R600 Localized edema: Secondary | ICD-10-CM | POA: Diagnosis not present

## 2018-10-23 DIAGNOSIS — I70299 Other atherosclerosis of native arteries of extremities, unspecified extremity: Secondary | ICD-10-CM | POA: Diagnosis not present

## 2018-10-23 DIAGNOSIS — I10 Essential (primary) hypertension: Secondary | ICD-10-CM | POA: Diagnosis not present

## 2018-10-23 DIAGNOSIS — R634 Abnormal weight loss: Secondary | ICD-10-CM | POA: Diagnosis not present

## 2018-10-23 DIAGNOSIS — L89153 Pressure ulcer of sacral region, stage 3: Secondary | ICD-10-CM | POA: Diagnosis not present

## 2018-10-23 DIAGNOSIS — I890 Lymphedema, not elsewhere classified: Secondary | ICD-10-CM | POA: Diagnosis not present

## 2018-10-24 DIAGNOSIS — L89153 Pressure ulcer of sacral region, stage 3: Secondary | ICD-10-CM | POA: Diagnosis not present

## 2018-10-24 DIAGNOSIS — L97819 Non-pressure chronic ulcer of other part of right lower leg with unspecified severity: Secondary | ICD-10-CM | POA: Diagnosis not present

## 2018-10-24 DIAGNOSIS — I70299 Other atherosclerosis of native arteries of extremities, unspecified extremity: Secondary | ICD-10-CM | POA: Diagnosis not present

## 2018-10-24 DIAGNOSIS — R634 Abnormal weight loss: Secondary | ICD-10-CM | POA: Diagnosis not present

## 2018-10-24 DIAGNOSIS — I872 Venous insufficiency (chronic) (peripheral): Secondary | ICD-10-CM | POA: Diagnosis not present

## 2018-10-24 DIAGNOSIS — I890 Lymphedema, not elsewhere classified: Secondary | ICD-10-CM | POA: Diagnosis not present

## 2018-10-25 DIAGNOSIS — L97819 Non-pressure chronic ulcer of other part of right lower leg with unspecified severity: Secondary | ICD-10-CM | POA: Diagnosis not present

## 2018-10-25 DIAGNOSIS — R634 Abnormal weight loss: Secondary | ICD-10-CM | POA: Diagnosis not present

## 2018-10-25 DIAGNOSIS — I872 Venous insufficiency (chronic) (peripheral): Secondary | ICD-10-CM | POA: Diagnosis not present

## 2018-10-25 DIAGNOSIS — I70299 Other atherosclerosis of native arteries of extremities, unspecified extremity: Secondary | ICD-10-CM | POA: Diagnosis not present

## 2018-10-25 DIAGNOSIS — L89153 Pressure ulcer of sacral region, stage 3: Secondary | ICD-10-CM | POA: Diagnosis not present

## 2018-10-25 DIAGNOSIS — I890 Lymphedema, not elsewhere classified: Secondary | ICD-10-CM | POA: Diagnosis not present

## 2018-10-29 DIAGNOSIS — L97819 Non-pressure chronic ulcer of other part of right lower leg with unspecified severity: Secondary | ICD-10-CM | POA: Diagnosis not present

## 2018-10-29 DIAGNOSIS — R634 Abnormal weight loss: Secondary | ICD-10-CM | POA: Diagnosis not present

## 2018-10-29 DIAGNOSIS — I890 Lymphedema, not elsewhere classified: Secondary | ICD-10-CM | POA: Diagnosis not present

## 2018-10-29 DIAGNOSIS — L89153 Pressure ulcer of sacral region, stage 3: Secondary | ICD-10-CM | POA: Diagnosis not present

## 2018-10-29 DIAGNOSIS — I872 Venous insufficiency (chronic) (peripheral): Secondary | ICD-10-CM | POA: Diagnosis not present

## 2018-10-29 DIAGNOSIS — I70299 Other atherosclerosis of native arteries of extremities, unspecified extremity: Secondary | ICD-10-CM | POA: Diagnosis not present

## 2018-10-31 DIAGNOSIS — I890 Lymphedema, not elsewhere classified: Secondary | ICD-10-CM | POA: Diagnosis not present

## 2018-10-31 DIAGNOSIS — R634 Abnormal weight loss: Secondary | ICD-10-CM | POA: Diagnosis not present

## 2018-10-31 DIAGNOSIS — L89153 Pressure ulcer of sacral region, stage 3: Secondary | ICD-10-CM | POA: Diagnosis not present

## 2018-10-31 DIAGNOSIS — I872 Venous insufficiency (chronic) (peripheral): Secondary | ICD-10-CM | POA: Diagnosis not present

## 2018-10-31 DIAGNOSIS — I70299 Other atherosclerosis of native arteries of extremities, unspecified extremity: Secondary | ICD-10-CM | POA: Diagnosis not present

## 2018-10-31 DIAGNOSIS — L97819 Non-pressure chronic ulcer of other part of right lower leg with unspecified severity: Secondary | ICD-10-CM | POA: Diagnosis not present

## 2018-11-01 DIAGNOSIS — L89153 Pressure ulcer of sacral region, stage 3: Secondary | ICD-10-CM | POA: Diagnosis not present

## 2018-11-01 DIAGNOSIS — R634 Abnormal weight loss: Secondary | ICD-10-CM | POA: Diagnosis not present

## 2018-11-01 DIAGNOSIS — L97819 Non-pressure chronic ulcer of other part of right lower leg with unspecified severity: Secondary | ICD-10-CM | POA: Diagnosis not present

## 2018-11-01 DIAGNOSIS — I70299 Other atherosclerosis of native arteries of extremities, unspecified extremity: Secondary | ICD-10-CM | POA: Diagnosis not present

## 2018-11-01 DIAGNOSIS — I872 Venous insufficiency (chronic) (peripheral): Secondary | ICD-10-CM | POA: Diagnosis not present

## 2018-11-01 DIAGNOSIS — I890 Lymphedema, not elsewhere classified: Secondary | ICD-10-CM | POA: Diagnosis not present

## 2018-11-05 DIAGNOSIS — L89153 Pressure ulcer of sacral region, stage 3: Secondary | ICD-10-CM | POA: Diagnosis not present

## 2018-11-05 DIAGNOSIS — R634 Abnormal weight loss: Secondary | ICD-10-CM | POA: Diagnosis not present

## 2018-11-05 DIAGNOSIS — I890 Lymphedema, not elsewhere classified: Secondary | ICD-10-CM | POA: Diagnosis not present

## 2018-11-05 DIAGNOSIS — L97819 Non-pressure chronic ulcer of other part of right lower leg with unspecified severity: Secondary | ICD-10-CM | POA: Diagnosis not present

## 2018-11-05 DIAGNOSIS — I872 Venous insufficiency (chronic) (peripheral): Secondary | ICD-10-CM | POA: Diagnosis not present

## 2018-11-05 DIAGNOSIS — I70299 Other atherosclerosis of native arteries of extremities, unspecified extremity: Secondary | ICD-10-CM | POA: Diagnosis not present

## 2018-11-07 DIAGNOSIS — I70299 Other atherosclerosis of native arteries of extremities, unspecified extremity: Secondary | ICD-10-CM | POA: Diagnosis not present

## 2018-11-07 DIAGNOSIS — L89153 Pressure ulcer of sacral region, stage 3: Secondary | ICD-10-CM | POA: Diagnosis not present

## 2018-11-07 DIAGNOSIS — I872 Venous insufficiency (chronic) (peripheral): Secondary | ICD-10-CM | POA: Diagnosis not present

## 2018-11-07 DIAGNOSIS — L97819 Non-pressure chronic ulcer of other part of right lower leg with unspecified severity: Secondary | ICD-10-CM | POA: Diagnosis not present

## 2018-11-07 DIAGNOSIS — I890 Lymphedema, not elsewhere classified: Secondary | ICD-10-CM | POA: Diagnosis not present

## 2018-11-07 DIAGNOSIS — R634 Abnormal weight loss: Secondary | ICD-10-CM | POA: Diagnosis not present

## 2018-11-08 DIAGNOSIS — R634 Abnormal weight loss: Secondary | ICD-10-CM | POA: Diagnosis not present

## 2018-11-08 DIAGNOSIS — L89153 Pressure ulcer of sacral region, stage 3: Secondary | ICD-10-CM | POA: Diagnosis not present

## 2018-11-08 DIAGNOSIS — L97819 Non-pressure chronic ulcer of other part of right lower leg with unspecified severity: Secondary | ICD-10-CM | POA: Diagnosis not present

## 2018-11-08 DIAGNOSIS — I872 Venous insufficiency (chronic) (peripheral): Secondary | ICD-10-CM | POA: Diagnosis not present

## 2018-11-08 DIAGNOSIS — I890 Lymphedema, not elsewhere classified: Secondary | ICD-10-CM | POA: Diagnosis not present

## 2018-11-08 DIAGNOSIS — I70299 Other atherosclerosis of native arteries of extremities, unspecified extremity: Secondary | ICD-10-CM | POA: Diagnosis not present

## 2018-11-11 DIAGNOSIS — L97819 Non-pressure chronic ulcer of other part of right lower leg with unspecified severity: Secondary | ICD-10-CM | POA: Diagnosis not present

## 2018-11-11 DIAGNOSIS — I890 Lymphedema, not elsewhere classified: Secondary | ICD-10-CM | POA: Diagnosis not present

## 2018-11-11 DIAGNOSIS — I872 Venous insufficiency (chronic) (peripheral): Secondary | ICD-10-CM | POA: Diagnosis not present

## 2018-11-11 DIAGNOSIS — R634 Abnormal weight loss: Secondary | ICD-10-CM | POA: Diagnosis not present

## 2018-11-11 DIAGNOSIS — L89153 Pressure ulcer of sacral region, stage 3: Secondary | ICD-10-CM | POA: Diagnosis not present

## 2018-11-11 DIAGNOSIS — I70299 Other atherosclerosis of native arteries of extremities, unspecified extremity: Secondary | ICD-10-CM | POA: Diagnosis not present

## 2018-11-12 DIAGNOSIS — L89153 Pressure ulcer of sacral region, stage 3: Secondary | ICD-10-CM | POA: Diagnosis not present

## 2018-11-12 DIAGNOSIS — R634 Abnormal weight loss: Secondary | ICD-10-CM | POA: Diagnosis not present

## 2018-11-12 DIAGNOSIS — L97819 Non-pressure chronic ulcer of other part of right lower leg with unspecified severity: Secondary | ICD-10-CM | POA: Diagnosis not present

## 2018-11-12 DIAGNOSIS — I872 Venous insufficiency (chronic) (peripheral): Secondary | ICD-10-CM | POA: Diagnosis not present

## 2018-11-12 DIAGNOSIS — I890 Lymphedema, not elsewhere classified: Secondary | ICD-10-CM | POA: Diagnosis not present

## 2018-11-12 DIAGNOSIS — I70299 Other atherosclerosis of native arteries of extremities, unspecified extremity: Secondary | ICD-10-CM | POA: Diagnosis not present

## 2018-11-14 DIAGNOSIS — I70299 Other atherosclerosis of native arteries of extremities, unspecified extremity: Secondary | ICD-10-CM | POA: Diagnosis not present

## 2018-11-14 DIAGNOSIS — R634 Abnormal weight loss: Secondary | ICD-10-CM | POA: Diagnosis not present

## 2018-11-14 DIAGNOSIS — I872 Venous insufficiency (chronic) (peripheral): Secondary | ICD-10-CM | POA: Diagnosis not present

## 2018-11-14 DIAGNOSIS — I890 Lymphedema, not elsewhere classified: Secondary | ICD-10-CM | POA: Diagnosis not present

## 2018-11-14 DIAGNOSIS — L97819 Non-pressure chronic ulcer of other part of right lower leg with unspecified severity: Secondary | ICD-10-CM | POA: Diagnosis not present

## 2018-11-14 DIAGNOSIS — L89153 Pressure ulcer of sacral region, stage 3: Secondary | ICD-10-CM | POA: Diagnosis not present

## 2018-11-15 DIAGNOSIS — I70299 Other atherosclerosis of native arteries of extremities, unspecified extremity: Secondary | ICD-10-CM | POA: Diagnosis not present

## 2018-11-15 DIAGNOSIS — L97819 Non-pressure chronic ulcer of other part of right lower leg with unspecified severity: Secondary | ICD-10-CM | POA: Diagnosis not present

## 2018-11-15 DIAGNOSIS — L89153 Pressure ulcer of sacral region, stage 3: Secondary | ICD-10-CM | POA: Diagnosis not present

## 2018-11-15 DIAGNOSIS — I890 Lymphedema, not elsewhere classified: Secondary | ICD-10-CM | POA: Diagnosis not present

## 2018-11-15 DIAGNOSIS — I872 Venous insufficiency (chronic) (peripheral): Secondary | ICD-10-CM | POA: Diagnosis not present

## 2018-11-15 DIAGNOSIS — R634 Abnormal weight loss: Secondary | ICD-10-CM | POA: Diagnosis not present

## 2018-11-19 DIAGNOSIS — L89153 Pressure ulcer of sacral region, stage 3: Secondary | ICD-10-CM | POA: Diagnosis not present

## 2018-11-19 DIAGNOSIS — I70299 Other atherosclerosis of native arteries of extremities, unspecified extremity: Secondary | ICD-10-CM | POA: Diagnosis not present

## 2018-11-19 DIAGNOSIS — I872 Venous insufficiency (chronic) (peripheral): Secondary | ICD-10-CM | POA: Diagnosis not present

## 2018-11-19 DIAGNOSIS — I890 Lymphedema, not elsewhere classified: Secondary | ICD-10-CM | POA: Diagnosis not present

## 2018-11-19 DIAGNOSIS — L97819 Non-pressure chronic ulcer of other part of right lower leg with unspecified severity: Secondary | ICD-10-CM | POA: Diagnosis not present

## 2018-11-19 DIAGNOSIS — R634 Abnormal weight loss: Secondary | ICD-10-CM | POA: Diagnosis not present

## 2018-11-20 DIAGNOSIS — M2021 Hallux rigidus, right foot: Secondary | ICD-10-CM | POA: Diagnosis not present

## 2018-11-20 DIAGNOSIS — L600 Ingrowing nail: Secondary | ICD-10-CM | POA: Diagnosis not present

## 2018-11-20 DIAGNOSIS — M79674 Pain in right toe(s): Secondary | ICD-10-CM | POA: Diagnosis not present

## 2018-11-20 DIAGNOSIS — I739 Peripheral vascular disease, unspecified: Secondary | ICD-10-CM | POA: Diagnosis not present

## 2018-11-20 DIAGNOSIS — B351 Tinea unguium: Secondary | ICD-10-CM | POA: Diagnosis not present

## 2018-11-21 DIAGNOSIS — R634 Abnormal weight loss: Secondary | ICD-10-CM | POA: Diagnosis not present

## 2018-11-21 DIAGNOSIS — I890 Lymphedema, not elsewhere classified: Secondary | ICD-10-CM | POA: Diagnosis not present

## 2018-11-21 DIAGNOSIS — I70299 Other atherosclerosis of native arteries of extremities, unspecified extremity: Secondary | ICD-10-CM | POA: Diagnosis not present

## 2018-11-21 DIAGNOSIS — L89153 Pressure ulcer of sacral region, stage 3: Secondary | ICD-10-CM | POA: Diagnosis not present

## 2018-11-21 DIAGNOSIS — I872 Venous insufficiency (chronic) (peripheral): Secondary | ICD-10-CM | POA: Diagnosis not present

## 2018-11-21 DIAGNOSIS — L97819 Non-pressure chronic ulcer of other part of right lower leg with unspecified severity: Secondary | ICD-10-CM | POA: Diagnosis not present

## 2018-11-22 DIAGNOSIS — I70299 Other atherosclerosis of native arteries of extremities, unspecified extremity: Secondary | ICD-10-CM | POA: Diagnosis not present

## 2018-11-22 DIAGNOSIS — L89153 Pressure ulcer of sacral region, stage 3: Secondary | ICD-10-CM | POA: Diagnosis not present

## 2018-11-22 DIAGNOSIS — I872 Venous insufficiency (chronic) (peripheral): Secondary | ICD-10-CM | POA: Diagnosis not present

## 2018-11-22 DIAGNOSIS — R634 Abnormal weight loss: Secondary | ICD-10-CM | POA: Diagnosis not present

## 2018-11-22 DIAGNOSIS — I890 Lymphedema, not elsewhere classified: Secondary | ICD-10-CM | POA: Diagnosis not present

## 2018-11-22 DIAGNOSIS — L97819 Non-pressure chronic ulcer of other part of right lower leg with unspecified severity: Secondary | ICD-10-CM | POA: Diagnosis not present

## 2018-11-23 DIAGNOSIS — I872 Venous insufficiency (chronic) (peripheral): Secondary | ICD-10-CM | POA: Diagnosis not present

## 2018-11-23 DIAGNOSIS — R634 Abnormal weight loss: Secondary | ICD-10-CM | POA: Diagnosis not present

## 2018-11-23 DIAGNOSIS — R600 Localized edema: Secondary | ICD-10-CM | POA: Diagnosis not present

## 2018-11-23 DIAGNOSIS — L97819 Non-pressure chronic ulcer of other part of right lower leg with unspecified severity: Secondary | ICD-10-CM | POA: Diagnosis not present

## 2018-11-23 DIAGNOSIS — L89153 Pressure ulcer of sacral region, stage 3: Secondary | ICD-10-CM | POA: Diagnosis not present

## 2018-11-23 DIAGNOSIS — I10 Essential (primary) hypertension: Secondary | ICD-10-CM | POA: Diagnosis not present

## 2018-11-23 DIAGNOSIS — R5381 Other malaise: Secondary | ICD-10-CM | POA: Diagnosis not present

## 2018-11-23 DIAGNOSIS — I890 Lymphedema, not elsewhere classified: Secondary | ICD-10-CM | POA: Diagnosis not present

## 2018-11-23 DIAGNOSIS — I70299 Other atherosclerosis of native arteries of extremities, unspecified extremity: Secondary | ICD-10-CM | POA: Diagnosis not present

## 2018-11-25 DIAGNOSIS — L89153 Pressure ulcer of sacral region, stage 3: Secondary | ICD-10-CM | POA: Diagnosis not present

## 2018-11-25 DIAGNOSIS — I890 Lymphedema, not elsewhere classified: Secondary | ICD-10-CM | POA: Diagnosis not present

## 2018-11-25 DIAGNOSIS — R634 Abnormal weight loss: Secondary | ICD-10-CM | POA: Diagnosis not present

## 2018-11-25 DIAGNOSIS — L97819 Non-pressure chronic ulcer of other part of right lower leg with unspecified severity: Secondary | ICD-10-CM | POA: Diagnosis not present

## 2018-11-25 DIAGNOSIS — I70299 Other atherosclerosis of native arteries of extremities, unspecified extremity: Secondary | ICD-10-CM | POA: Diagnosis not present

## 2018-11-25 DIAGNOSIS — I872 Venous insufficiency (chronic) (peripheral): Secondary | ICD-10-CM | POA: Diagnosis not present

## 2018-11-26 DIAGNOSIS — I872 Venous insufficiency (chronic) (peripheral): Secondary | ICD-10-CM | POA: Diagnosis not present

## 2018-11-26 DIAGNOSIS — I890 Lymphedema, not elsewhere classified: Secondary | ICD-10-CM | POA: Diagnosis not present

## 2018-11-26 DIAGNOSIS — L97819 Non-pressure chronic ulcer of other part of right lower leg with unspecified severity: Secondary | ICD-10-CM | POA: Diagnosis not present

## 2018-11-26 DIAGNOSIS — I70299 Other atherosclerosis of native arteries of extremities, unspecified extremity: Secondary | ICD-10-CM | POA: Diagnosis not present

## 2018-11-26 DIAGNOSIS — R634 Abnormal weight loss: Secondary | ICD-10-CM | POA: Diagnosis not present

## 2018-11-26 DIAGNOSIS — L89153 Pressure ulcer of sacral region, stage 3: Secondary | ICD-10-CM | POA: Diagnosis not present

## 2018-11-28 DIAGNOSIS — I70299 Other atherosclerosis of native arteries of extremities, unspecified extremity: Secondary | ICD-10-CM | POA: Diagnosis not present

## 2018-11-28 DIAGNOSIS — R634 Abnormal weight loss: Secondary | ICD-10-CM | POA: Diagnosis not present

## 2018-11-28 DIAGNOSIS — L97819 Non-pressure chronic ulcer of other part of right lower leg with unspecified severity: Secondary | ICD-10-CM | POA: Diagnosis not present

## 2018-11-28 DIAGNOSIS — I872 Venous insufficiency (chronic) (peripheral): Secondary | ICD-10-CM | POA: Diagnosis not present

## 2018-11-28 DIAGNOSIS — L89153 Pressure ulcer of sacral region, stage 3: Secondary | ICD-10-CM | POA: Diagnosis not present

## 2018-11-28 DIAGNOSIS — I890 Lymphedema, not elsewhere classified: Secondary | ICD-10-CM | POA: Diagnosis not present

## 2018-11-29 DIAGNOSIS — L89153 Pressure ulcer of sacral region, stage 3: Secondary | ICD-10-CM | POA: Diagnosis not present

## 2018-11-29 DIAGNOSIS — L97819 Non-pressure chronic ulcer of other part of right lower leg with unspecified severity: Secondary | ICD-10-CM | POA: Diagnosis not present

## 2018-11-29 DIAGNOSIS — R634 Abnormal weight loss: Secondary | ICD-10-CM | POA: Diagnosis not present

## 2018-11-29 DIAGNOSIS — I890 Lymphedema, not elsewhere classified: Secondary | ICD-10-CM | POA: Diagnosis not present

## 2018-11-29 DIAGNOSIS — I872 Venous insufficiency (chronic) (peripheral): Secondary | ICD-10-CM | POA: Diagnosis not present

## 2018-11-29 DIAGNOSIS — I70299 Other atherosclerosis of native arteries of extremities, unspecified extremity: Secondary | ICD-10-CM | POA: Diagnosis not present

## 2018-11-30 DIAGNOSIS — L97819 Non-pressure chronic ulcer of other part of right lower leg with unspecified severity: Secondary | ICD-10-CM | POA: Diagnosis not present

## 2018-11-30 DIAGNOSIS — R634 Abnormal weight loss: Secondary | ICD-10-CM | POA: Diagnosis not present

## 2018-11-30 DIAGNOSIS — I872 Venous insufficiency (chronic) (peripheral): Secondary | ICD-10-CM | POA: Diagnosis not present

## 2018-11-30 DIAGNOSIS — I890 Lymphedema, not elsewhere classified: Secondary | ICD-10-CM | POA: Diagnosis not present

## 2018-11-30 DIAGNOSIS — I70299 Other atherosclerosis of native arteries of extremities, unspecified extremity: Secondary | ICD-10-CM | POA: Diagnosis not present

## 2018-11-30 DIAGNOSIS — L89153 Pressure ulcer of sacral region, stage 3: Secondary | ICD-10-CM | POA: Diagnosis not present

## 2018-12-03 DIAGNOSIS — I70299 Other atherosclerosis of native arteries of extremities, unspecified extremity: Secondary | ICD-10-CM | POA: Diagnosis not present

## 2018-12-03 DIAGNOSIS — R634 Abnormal weight loss: Secondary | ICD-10-CM | POA: Diagnosis not present

## 2018-12-03 DIAGNOSIS — L89153 Pressure ulcer of sacral region, stage 3: Secondary | ICD-10-CM | POA: Diagnosis not present

## 2018-12-03 DIAGNOSIS — L97819 Non-pressure chronic ulcer of other part of right lower leg with unspecified severity: Secondary | ICD-10-CM | POA: Diagnosis not present

## 2018-12-03 DIAGNOSIS — I872 Venous insufficiency (chronic) (peripheral): Secondary | ICD-10-CM | POA: Diagnosis not present

## 2018-12-03 DIAGNOSIS — I890 Lymphedema, not elsewhere classified: Secondary | ICD-10-CM | POA: Diagnosis not present

## 2018-12-04 DIAGNOSIS — L97819 Non-pressure chronic ulcer of other part of right lower leg with unspecified severity: Secondary | ICD-10-CM | POA: Diagnosis not present

## 2018-12-04 DIAGNOSIS — R634 Abnormal weight loss: Secondary | ICD-10-CM | POA: Diagnosis not present

## 2018-12-04 DIAGNOSIS — I872 Venous insufficiency (chronic) (peripheral): Secondary | ICD-10-CM | POA: Diagnosis not present

## 2018-12-04 DIAGNOSIS — I70299 Other atherosclerosis of native arteries of extremities, unspecified extremity: Secondary | ICD-10-CM | POA: Diagnosis not present

## 2018-12-04 DIAGNOSIS — L89153 Pressure ulcer of sacral region, stage 3: Secondary | ICD-10-CM | POA: Diagnosis not present

## 2018-12-04 DIAGNOSIS — I890 Lymphedema, not elsewhere classified: Secondary | ICD-10-CM | POA: Diagnosis not present

## 2018-12-05 DIAGNOSIS — I890 Lymphedema, not elsewhere classified: Secondary | ICD-10-CM | POA: Diagnosis not present

## 2018-12-05 DIAGNOSIS — R634 Abnormal weight loss: Secondary | ICD-10-CM | POA: Diagnosis not present

## 2018-12-05 DIAGNOSIS — I872 Venous insufficiency (chronic) (peripheral): Secondary | ICD-10-CM | POA: Diagnosis not present

## 2018-12-05 DIAGNOSIS — I70299 Other atherosclerosis of native arteries of extremities, unspecified extremity: Secondary | ICD-10-CM | POA: Diagnosis not present

## 2018-12-05 DIAGNOSIS — L97819 Non-pressure chronic ulcer of other part of right lower leg with unspecified severity: Secondary | ICD-10-CM | POA: Diagnosis not present

## 2018-12-05 DIAGNOSIS — L89153 Pressure ulcer of sacral region, stage 3: Secondary | ICD-10-CM | POA: Diagnosis not present

## 2018-12-06 DIAGNOSIS — I890 Lymphedema, not elsewhere classified: Secondary | ICD-10-CM | POA: Diagnosis not present

## 2018-12-06 DIAGNOSIS — L97819 Non-pressure chronic ulcer of other part of right lower leg with unspecified severity: Secondary | ICD-10-CM | POA: Diagnosis not present

## 2018-12-06 DIAGNOSIS — I872 Venous insufficiency (chronic) (peripheral): Secondary | ICD-10-CM | POA: Diagnosis not present

## 2018-12-06 DIAGNOSIS — I70299 Other atherosclerosis of native arteries of extremities, unspecified extremity: Secondary | ICD-10-CM | POA: Diagnosis not present

## 2018-12-06 DIAGNOSIS — R634 Abnormal weight loss: Secondary | ICD-10-CM | POA: Diagnosis not present

## 2018-12-06 DIAGNOSIS — L89153 Pressure ulcer of sacral region, stage 3: Secondary | ICD-10-CM | POA: Diagnosis not present

## 2018-12-10 DIAGNOSIS — L89153 Pressure ulcer of sacral region, stage 3: Secondary | ICD-10-CM | POA: Diagnosis not present

## 2018-12-10 DIAGNOSIS — I70299 Other atherosclerosis of native arteries of extremities, unspecified extremity: Secondary | ICD-10-CM | POA: Diagnosis not present

## 2018-12-10 DIAGNOSIS — I872 Venous insufficiency (chronic) (peripheral): Secondary | ICD-10-CM | POA: Diagnosis not present

## 2018-12-10 DIAGNOSIS — L97819 Non-pressure chronic ulcer of other part of right lower leg with unspecified severity: Secondary | ICD-10-CM | POA: Diagnosis not present

## 2018-12-10 DIAGNOSIS — R634 Abnormal weight loss: Secondary | ICD-10-CM | POA: Diagnosis not present

## 2018-12-10 DIAGNOSIS — I890 Lymphedema, not elsewhere classified: Secondary | ICD-10-CM | POA: Diagnosis not present

## 2018-12-12 DIAGNOSIS — L97819 Non-pressure chronic ulcer of other part of right lower leg with unspecified severity: Secondary | ICD-10-CM | POA: Diagnosis not present

## 2018-12-12 DIAGNOSIS — I872 Venous insufficiency (chronic) (peripheral): Secondary | ICD-10-CM | POA: Diagnosis not present

## 2018-12-12 DIAGNOSIS — L89153 Pressure ulcer of sacral region, stage 3: Secondary | ICD-10-CM | POA: Diagnosis not present

## 2018-12-12 DIAGNOSIS — I890 Lymphedema, not elsewhere classified: Secondary | ICD-10-CM | POA: Diagnosis not present

## 2018-12-12 DIAGNOSIS — I70299 Other atherosclerosis of native arteries of extremities, unspecified extremity: Secondary | ICD-10-CM | POA: Diagnosis not present

## 2018-12-12 DIAGNOSIS — R634 Abnormal weight loss: Secondary | ICD-10-CM | POA: Diagnosis not present

## 2018-12-13 DIAGNOSIS — I872 Venous insufficiency (chronic) (peripheral): Secondary | ICD-10-CM | POA: Diagnosis not present

## 2018-12-13 DIAGNOSIS — I890 Lymphedema, not elsewhere classified: Secondary | ICD-10-CM | POA: Diagnosis not present

## 2018-12-13 DIAGNOSIS — I70299 Other atherosclerosis of native arteries of extremities, unspecified extremity: Secondary | ICD-10-CM | POA: Diagnosis not present

## 2018-12-13 DIAGNOSIS — L97819 Non-pressure chronic ulcer of other part of right lower leg with unspecified severity: Secondary | ICD-10-CM | POA: Diagnosis not present

## 2018-12-13 DIAGNOSIS — R634 Abnormal weight loss: Secondary | ICD-10-CM | POA: Diagnosis not present

## 2018-12-13 DIAGNOSIS — L89153 Pressure ulcer of sacral region, stage 3: Secondary | ICD-10-CM | POA: Diagnosis not present

## 2018-12-16 DIAGNOSIS — I890 Lymphedema, not elsewhere classified: Secondary | ICD-10-CM | POA: Diagnosis not present

## 2018-12-16 DIAGNOSIS — L97819 Non-pressure chronic ulcer of other part of right lower leg with unspecified severity: Secondary | ICD-10-CM | POA: Diagnosis not present

## 2018-12-16 DIAGNOSIS — I872 Venous insufficiency (chronic) (peripheral): Secondary | ICD-10-CM | POA: Diagnosis not present

## 2018-12-16 DIAGNOSIS — L89153 Pressure ulcer of sacral region, stage 3: Secondary | ICD-10-CM | POA: Diagnosis not present

## 2018-12-16 DIAGNOSIS — I70299 Other atherosclerosis of native arteries of extremities, unspecified extremity: Secondary | ICD-10-CM | POA: Diagnosis not present

## 2018-12-16 DIAGNOSIS — R634 Abnormal weight loss: Secondary | ICD-10-CM | POA: Diagnosis not present

## 2018-12-17 DIAGNOSIS — I872 Venous insufficiency (chronic) (peripheral): Secondary | ICD-10-CM | POA: Diagnosis not present

## 2018-12-17 DIAGNOSIS — L97819 Non-pressure chronic ulcer of other part of right lower leg with unspecified severity: Secondary | ICD-10-CM | POA: Diagnosis not present

## 2018-12-17 DIAGNOSIS — L89153 Pressure ulcer of sacral region, stage 3: Secondary | ICD-10-CM | POA: Diagnosis not present

## 2018-12-17 DIAGNOSIS — I70299 Other atherosclerosis of native arteries of extremities, unspecified extremity: Secondary | ICD-10-CM | POA: Diagnosis not present

## 2018-12-17 DIAGNOSIS — I890 Lymphedema, not elsewhere classified: Secondary | ICD-10-CM | POA: Diagnosis not present

## 2018-12-17 DIAGNOSIS — R634 Abnormal weight loss: Secondary | ICD-10-CM | POA: Diagnosis not present

## 2018-12-19 DIAGNOSIS — R634 Abnormal weight loss: Secondary | ICD-10-CM | POA: Diagnosis not present

## 2018-12-19 DIAGNOSIS — L97819 Non-pressure chronic ulcer of other part of right lower leg with unspecified severity: Secondary | ICD-10-CM | POA: Diagnosis not present

## 2018-12-19 DIAGNOSIS — I890 Lymphedema, not elsewhere classified: Secondary | ICD-10-CM | POA: Diagnosis not present

## 2018-12-19 DIAGNOSIS — I70299 Other atherosclerosis of native arteries of extremities, unspecified extremity: Secondary | ICD-10-CM | POA: Diagnosis not present

## 2018-12-19 DIAGNOSIS — I872 Venous insufficiency (chronic) (peripheral): Secondary | ICD-10-CM | POA: Diagnosis not present

## 2018-12-19 DIAGNOSIS — L89153 Pressure ulcer of sacral region, stage 3: Secondary | ICD-10-CM | POA: Diagnosis not present

## 2018-12-20 DIAGNOSIS — I872 Venous insufficiency (chronic) (peripheral): Secondary | ICD-10-CM | POA: Diagnosis not present

## 2018-12-20 DIAGNOSIS — I70299 Other atherosclerosis of native arteries of extremities, unspecified extremity: Secondary | ICD-10-CM | POA: Diagnosis not present

## 2018-12-20 DIAGNOSIS — I890 Lymphedema, not elsewhere classified: Secondary | ICD-10-CM | POA: Diagnosis not present

## 2018-12-20 DIAGNOSIS — L89153 Pressure ulcer of sacral region, stage 3: Secondary | ICD-10-CM | POA: Diagnosis not present

## 2018-12-20 DIAGNOSIS — R634 Abnormal weight loss: Secondary | ICD-10-CM | POA: Diagnosis not present

## 2018-12-20 DIAGNOSIS — L97819 Non-pressure chronic ulcer of other part of right lower leg with unspecified severity: Secondary | ICD-10-CM | POA: Diagnosis not present

## 2018-12-23 DIAGNOSIS — R634 Abnormal weight loss: Secondary | ICD-10-CM | POA: Diagnosis not present

## 2018-12-23 DIAGNOSIS — I70299 Other atherosclerosis of native arteries of extremities, unspecified extremity: Secondary | ICD-10-CM | POA: Diagnosis not present

## 2018-12-23 DIAGNOSIS — I10 Essential (primary) hypertension: Secondary | ICD-10-CM | POA: Diagnosis not present

## 2018-12-23 DIAGNOSIS — L97819 Non-pressure chronic ulcer of other part of right lower leg with unspecified severity: Secondary | ICD-10-CM | POA: Diagnosis not present

## 2018-12-23 DIAGNOSIS — R5381 Other malaise: Secondary | ICD-10-CM | POA: Diagnosis not present

## 2018-12-23 DIAGNOSIS — L89153 Pressure ulcer of sacral region, stage 3: Secondary | ICD-10-CM | POA: Diagnosis not present

## 2018-12-23 DIAGNOSIS — I890 Lymphedema, not elsewhere classified: Secondary | ICD-10-CM | POA: Diagnosis not present

## 2018-12-23 DIAGNOSIS — I872 Venous insufficiency (chronic) (peripheral): Secondary | ICD-10-CM | POA: Diagnosis not present

## 2018-12-23 DIAGNOSIS — R600 Localized edema: Secondary | ICD-10-CM | POA: Diagnosis not present

## 2018-12-24 DIAGNOSIS — I70299 Other atherosclerosis of native arteries of extremities, unspecified extremity: Secondary | ICD-10-CM | POA: Diagnosis not present

## 2018-12-24 DIAGNOSIS — I890 Lymphedema, not elsewhere classified: Secondary | ICD-10-CM | POA: Diagnosis not present

## 2018-12-24 DIAGNOSIS — I872 Venous insufficiency (chronic) (peripheral): Secondary | ICD-10-CM | POA: Diagnosis not present

## 2018-12-24 DIAGNOSIS — R634 Abnormal weight loss: Secondary | ICD-10-CM | POA: Diagnosis not present

## 2018-12-24 DIAGNOSIS — L89153 Pressure ulcer of sacral region, stage 3: Secondary | ICD-10-CM | POA: Diagnosis not present

## 2018-12-24 DIAGNOSIS — L97819 Non-pressure chronic ulcer of other part of right lower leg with unspecified severity: Secondary | ICD-10-CM | POA: Diagnosis not present

## 2018-12-26 DIAGNOSIS — I70299 Other atherosclerosis of native arteries of extremities, unspecified extremity: Secondary | ICD-10-CM | POA: Diagnosis not present

## 2018-12-26 DIAGNOSIS — R634 Abnormal weight loss: Secondary | ICD-10-CM | POA: Diagnosis not present

## 2018-12-26 DIAGNOSIS — I890 Lymphedema, not elsewhere classified: Secondary | ICD-10-CM | POA: Diagnosis not present

## 2018-12-26 DIAGNOSIS — L89153 Pressure ulcer of sacral region, stage 3: Secondary | ICD-10-CM | POA: Diagnosis not present

## 2018-12-26 DIAGNOSIS — I872 Venous insufficiency (chronic) (peripheral): Secondary | ICD-10-CM | POA: Diagnosis not present

## 2018-12-26 DIAGNOSIS — L97819 Non-pressure chronic ulcer of other part of right lower leg with unspecified severity: Secondary | ICD-10-CM | POA: Diagnosis not present

## 2018-12-27 DIAGNOSIS — I890 Lymphedema, not elsewhere classified: Secondary | ICD-10-CM | POA: Diagnosis not present

## 2018-12-27 DIAGNOSIS — R634 Abnormal weight loss: Secondary | ICD-10-CM | POA: Diagnosis not present

## 2018-12-27 DIAGNOSIS — I70299 Other atherosclerosis of native arteries of extremities, unspecified extremity: Secondary | ICD-10-CM | POA: Diagnosis not present

## 2018-12-27 DIAGNOSIS — L97819 Non-pressure chronic ulcer of other part of right lower leg with unspecified severity: Secondary | ICD-10-CM | POA: Diagnosis not present

## 2018-12-27 DIAGNOSIS — I872 Venous insufficiency (chronic) (peripheral): Secondary | ICD-10-CM | POA: Diagnosis not present

## 2018-12-27 DIAGNOSIS — L89153 Pressure ulcer of sacral region, stage 3: Secondary | ICD-10-CM | POA: Diagnosis not present

## 2018-12-30 DIAGNOSIS — I872 Venous insufficiency (chronic) (peripheral): Secondary | ICD-10-CM | POA: Diagnosis not present

## 2018-12-30 DIAGNOSIS — L97819 Non-pressure chronic ulcer of other part of right lower leg with unspecified severity: Secondary | ICD-10-CM | POA: Diagnosis not present

## 2018-12-30 DIAGNOSIS — R634 Abnormal weight loss: Secondary | ICD-10-CM | POA: Diagnosis not present

## 2018-12-30 DIAGNOSIS — I890 Lymphedema, not elsewhere classified: Secondary | ICD-10-CM | POA: Diagnosis not present

## 2018-12-30 DIAGNOSIS — I70299 Other atherosclerosis of native arteries of extremities, unspecified extremity: Secondary | ICD-10-CM | POA: Diagnosis not present

## 2018-12-30 DIAGNOSIS — L89153 Pressure ulcer of sacral region, stage 3: Secondary | ICD-10-CM | POA: Diagnosis not present

## 2018-12-31 DIAGNOSIS — I70299 Other atherosclerosis of native arteries of extremities, unspecified extremity: Secondary | ICD-10-CM | POA: Diagnosis not present

## 2018-12-31 DIAGNOSIS — L89153 Pressure ulcer of sacral region, stage 3: Secondary | ICD-10-CM | POA: Diagnosis not present

## 2018-12-31 DIAGNOSIS — R634 Abnormal weight loss: Secondary | ICD-10-CM | POA: Diagnosis not present

## 2018-12-31 DIAGNOSIS — I872 Venous insufficiency (chronic) (peripheral): Secondary | ICD-10-CM | POA: Diagnosis not present

## 2018-12-31 DIAGNOSIS — I890 Lymphedema, not elsewhere classified: Secondary | ICD-10-CM | POA: Diagnosis not present

## 2018-12-31 DIAGNOSIS — L97819 Non-pressure chronic ulcer of other part of right lower leg with unspecified severity: Secondary | ICD-10-CM | POA: Diagnosis not present

## 2019-01-02 DIAGNOSIS — I70299 Other atherosclerosis of native arteries of extremities, unspecified extremity: Secondary | ICD-10-CM | POA: Diagnosis not present

## 2019-01-02 DIAGNOSIS — I890 Lymphedema, not elsewhere classified: Secondary | ICD-10-CM | POA: Diagnosis not present

## 2019-01-02 DIAGNOSIS — R634 Abnormal weight loss: Secondary | ICD-10-CM | POA: Diagnosis not present

## 2019-01-02 DIAGNOSIS — I872 Venous insufficiency (chronic) (peripheral): Secondary | ICD-10-CM | POA: Diagnosis not present

## 2019-01-02 DIAGNOSIS — L97819 Non-pressure chronic ulcer of other part of right lower leg with unspecified severity: Secondary | ICD-10-CM | POA: Diagnosis not present

## 2019-01-02 DIAGNOSIS — L89153 Pressure ulcer of sacral region, stage 3: Secondary | ICD-10-CM | POA: Diagnosis not present

## 2019-01-03 DIAGNOSIS — L97819 Non-pressure chronic ulcer of other part of right lower leg with unspecified severity: Secondary | ICD-10-CM | POA: Diagnosis not present

## 2019-01-03 DIAGNOSIS — I890 Lymphedema, not elsewhere classified: Secondary | ICD-10-CM | POA: Diagnosis not present

## 2019-01-03 DIAGNOSIS — L89153 Pressure ulcer of sacral region, stage 3: Secondary | ICD-10-CM | POA: Diagnosis not present

## 2019-01-03 DIAGNOSIS — R634 Abnormal weight loss: Secondary | ICD-10-CM | POA: Diagnosis not present

## 2019-01-03 DIAGNOSIS — I872 Venous insufficiency (chronic) (peripheral): Secondary | ICD-10-CM | POA: Diagnosis not present

## 2019-01-03 DIAGNOSIS — I70299 Other atherosclerosis of native arteries of extremities, unspecified extremity: Secondary | ICD-10-CM | POA: Diagnosis not present

## 2019-01-06 DIAGNOSIS — L97819 Non-pressure chronic ulcer of other part of right lower leg with unspecified severity: Secondary | ICD-10-CM | POA: Diagnosis not present

## 2019-01-06 DIAGNOSIS — I70299 Other atherosclerosis of native arteries of extremities, unspecified extremity: Secondary | ICD-10-CM | POA: Diagnosis not present

## 2019-01-06 DIAGNOSIS — L89153 Pressure ulcer of sacral region, stage 3: Secondary | ICD-10-CM | POA: Diagnosis not present

## 2019-01-06 DIAGNOSIS — I872 Venous insufficiency (chronic) (peripheral): Secondary | ICD-10-CM | POA: Diagnosis not present

## 2019-01-06 DIAGNOSIS — I890 Lymphedema, not elsewhere classified: Secondary | ICD-10-CM | POA: Diagnosis not present

## 2019-01-06 DIAGNOSIS — R634 Abnormal weight loss: Secondary | ICD-10-CM | POA: Diagnosis not present

## 2019-01-07 DIAGNOSIS — I890 Lymphedema, not elsewhere classified: Secondary | ICD-10-CM | POA: Diagnosis not present

## 2019-01-07 DIAGNOSIS — R634 Abnormal weight loss: Secondary | ICD-10-CM | POA: Diagnosis not present

## 2019-01-07 DIAGNOSIS — L97819 Non-pressure chronic ulcer of other part of right lower leg with unspecified severity: Secondary | ICD-10-CM | POA: Diagnosis not present

## 2019-01-07 DIAGNOSIS — L89153 Pressure ulcer of sacral region, stage 3: Secondary | ICD-10-CM | POA: Diagnosis not present

## 2019-01-07 DIAGNOSIS — I872 Venous insufficiency (chronic) (peripheral): Secondary | ICD-10-CM | POA: Diagnosis not present

## 2019-01-07 DIAGNOSIS — I70299 Other atherosclerosis of native arteries of extremities, unspecified extremity: Secondary | ICD-10-CM | POA: Diagnosis not present

## 2019-01-09 DIAGNOSIS — I872 Venous insufficiency (chronic) (peripheral): Secondary | ICD-10-CM | POA: Diagnosis not present

## 2019-01-09 DIAGNOSIS — L89153 Pressure ulcer of sacral region, stage 3: Secondary | ICD-10-CM | POA: Diagnosis not present

## 2019-01-09 DIAGNOSIS — L97819 Non-pressure chronic ulcer of other part of right lower leg with unspecified severity: Secondary | ICD-10-CM | POA: Diagnosis not present

## 2019-01-09 DIAGNOSIS — I890 Lymphedema, not elsewhere classified: Secondary | ICD-10-CM | POA: Diagnosis not present

## 2019-01-09 DIAGNOSIS — R634 Abnormal weight loss: Secondary | ICD-10-CM | POA: Diagnosis not present

## 2019-01-09 DIAGNOSIS — I70299 Other atherosclerosis of native arteries of extremities, unspecified extremity: Secondary | ICD-10-CM | POA: Diagnosis not present

## 2019-01-10 DIAGNOSIS — I890 Lymphedema, not elsewhere classified: Secondary | ICD-10-CM | POA: Diagnosis not present

## 2019-01-10 DIAGNOSIS — R634 Abnormal weight loss: Secondary | ICD-10-CM | POA: Diagnosis not present

## 2019-01-10 DIAGNOSIS — I872 Venous insufficiency (chronic) (peripheral): Secondary | ICD-10-CM | POA: Diagnosis not present

## 2019-01-10 DIAGNOSIS — L97819 Non-pressure chronic ulcer of other part of right lower leg with unspecified severity: Secondary | ICD-10-CM | POA: Diagnosis not present

## 2019-01-10 DIAGNOSIS — I70299 Other atherosclerosis of native arteries of extremities, unspecified extremity: Secondary | ICD-10-CM | POA: Diagnosis not present

## 2019-01-10 DIAGNOSIS — L89153 Pressure ulcer of sacral region, stage 3: Secondary | ICD-10-CM | POA: Diagnosis not present

## 2019-01-11 DIAGNOSIS — L89153 Pressure ulcer of sacral region, stage 3: Secondary | ICD-10-CM | POA: Diagnosis not present

## 2019-01-11 DIAGNOSIS — R634 Abnormal weight loss: Secondary | ICD-10-CM | POA: Diagnosis not present

## 2019-01-11 DIAGNOSIS — I70299 Other atherosclerosis of native arteries of extremities, unspecified extremity: Secondary | ICD-10-CM | POA: Diagnosis not present

## 2019-01-11 DIAGNOSIS — I872 Venous insufficiency (chronic) (peripheral): Secondary | ICD-10-CM | POA: Diagnosis not present

## 2019-01-11 DIAGNOSIS — L97819 Non-pressure chronic ulcer of other part of right lower leg with unspecified severity: Secondary | ICD-10-CM | POA: Diagnosis not present

## 2019-01-11 DIAGNOSIS — I890 Lymphedema, not elsewhere classified: Secondary | ICD-10-CM | POA: Diagnosis not present

## 2019-01-12 DIAGNOSIS — L97819 Non-pressure chronic ulcer of other part of right lower leg with unspecified severity: Secondary | ICD-10-CM | POA: Diagnosis not present

## 2019-01-12 DIAGNOSIS — I70299 Other atherosclerosis of native arteries of extremities, unspecified extremity: Secondary | ICD-10-CM | POA: Diagnosis not present

## 2019-01-12 DIAGNOSIS — I890 Lymphedema, not elsewhere classified: Secondary | ICD-10-CM | POA: Diagnosis not present

## 2019-01-12 DIAGNOSIS — L89153 Pressure ulcer of sacral region, stage 3: Secondary | ICD-10-CM | POA: Diagnosis not present

## 2019-01-12 DIAGNOSIS — R634 Abnormal weight loss: Secondary | ICD-10-CM | POA: Diagnosis not present

## 2019-01-12 DIAGNOSIS — I872 Venous insufficiency (chronic) (peripheral): Secondary | ICD-10-CM | POA: Diagnosis not present

## 2019-01-13 DIAGNOSIS — I890 Lymphedema, not elsewhere classified: Secondary | ICD-10-CM | POA: Diagnosis not present

## 2019-01-13 DIAGNOSIS — I872 Venous insufficiency (chronic) (peripheral): Secondary | ICD-10-CM | POA: Diagnosis not present

## 2019-01-13 DIAGNOSIS — I70299 Other atherosclerosis of native arteries of extremities, unspecified extremity: Secondary | ICD-10-CM | POA: Diagnosis not present

## 2019-01-13 DIAGNOSIS — L97819 Non-pressure chronic ulcer of other part of right lower leg with unspecified severity: Secondary | ICD-10-CM | POA: Diagnosis not present

## 2019-01-13 DIAGNOSIS — R634 Abnormal weight loss: Secondary | ICD-10-CM | POA: Diagnosis not present

## 2019-01-13 DIAGNOSIS — L89153 Pressure ulcer of sacral region, stage 3: Secondary | ICD-10-CM | POA: Diagnosis not present

## 2019-01-14 DIAGNOSIS — I890 Lymphedema, not elsewhere classified: Secondary | ICD-10-CM | POA: Diagnosis not present

## 2019-01-14 DIAGNOSIS — L89153 Pressure ulcer of sacral region, stage 3: Secondary | ICD-10-CM | POA: Diagnosis not present

## 2019-01-14 DIAGNOSIS — I70299 Other atherosclerosis of native arteries of extremities, unspecified extremity: Secondary | ICD-10-CM | POA: Diagnosis not present

## 2019-01-14 DIAGNOSIS — R634 Abnormal weight loss: Secondary | ICD-10-CM | POA: Diagnosis not present

## 2019-01-14 DIAGNOSIS — I872 Venous insufficiency (chronic) (peripheral): Secondary | ICD-10-CM | POA: Diagnosis not present

## 2019-01-14 DIAGNOSIS — L97819 Non-pressure chronic ulcer of other part of right lower leg with unspecified severity: Secondary | ICD-10-CM | POA: Diagnosis not present

## 2019-01-15 DIAGNOSIS — L97819 Non-pressure chronic ulcer of other part of right lower leg with unspecified severity: Secondary | ICD-10-CM | POA: Diagnosis not present

## 2019-01-15 DIAGNOSIS — I872 Venous insufficiency (chronic) (peripheral): Secondary | ICD-10-CM | POA: Diagnosis not present

## 2019-01-15 DIAGNOSIS — I890 Lymphedema, not elsewhere classified: Secondary | ICD-10-CM | POA: Diagnosis not present

## 2019-01-15 DIAGNOSIS — L89153 Pressure ulcer of sacral region, stage 3: Secondary | ICD-10-CM | POA: Diagnosis not present

## 2019-01-15 DIAGNOSIS — R634 Abnormal weight loss: Secondary | ICD-10-CM | POA: Diagnosis not present

## 2019-01-15 DIAGNOSIS — I70299 Other atherosclerosis of native arteries of extremities, unspecified extremity: Secondary | ICD-10-CM | POA: Diagnosis not present

## 2019-01-16 DIAGNOSIS — L89153 Pressure ulcer of sacral region, stage 3: Secondary | ICD-10-CM | POA: Diagnosis not present

## 2019-01-16 DIAGNOSIS — R634 Abnormal weight loss: Secondary | ICD-10-CM | POA: Diagnosis not present

## 2019-01-16 DIAGNOSIS — L97819 Non-pressure chronic ulcer of other part of right lower leg with unspecified severity: Secondary | ICD-10-CM | POA: Diagnosis not present

## 2019-01-16 DIAGNOSIS — I70299 Other atherosclerosis of native arteries of extremities, unspecified extremity: Secondary | ICD-10-CM | POA: Diagnosis not present

## 2019-01-16 DIAGNOSIS — I890 Lymphedema, not elsewhere classified: Secondary | ICD-10-CM | POA: Diagnosis not present

## 2019-01-16 DIAGNOSIS — I872 Venous insufficiency (chronic) (peripheral): Secondary | ICD-10-CM | POA: Diagnosis not present

## 2019-01-17 DIAGNOSIS — R634 Abnormal weight loss: Secondary | ICD-10-CM | POA: Diagnosis not present

## 2019-01-17 DIAGNOSIS — I70299 Other atherosclerosis of native arteries of extremities, unspecified extremity: Secondary | ICD-10-CM | POA: Diagnosis not present

## 2019-01-17 DIAGNOSIS — L89153 Pressure ulcer of sacral region, stage 3: Secondary | ICD-10-CM | POA: Diagnosis not present

## 2019-01-17 DIAGNOSIS — L97819 Non-pressure chronic ulcer of other part of right lower leg with unspecified severity: Secondary | ICD-10-CM | POA: Diagnosis not present

## 2019-01-17 DIAGNOSIS — I872 Venous insufficiency (chronic) (peripheral): Secondary | ICD-10-CM | POA: Diagnosis not present

## 2019-01-17 DIAGNOSIS — I890 Lymphedema, not elsewhere classified: Secondary | ICD-10-CM | POA: Diagnosis not present

## 2019-01-20 DIAGNOSIS — L97819 Non-pressure chronic ulcer of other part of right lower leg with unspecified severity: Secondary | ICD-10-CM | POA: Diagnosis not present

## 2019-01-20 DIAGNOSIS — I70299 Other atherosclerosis of native arteries of extremities, unspecified extremity: Secondary | ICD-10-CM | POA: Diagnosis not present

## 2019-01-20 DIAGNOSIS — L89153 Pressure ulcer of sacral region, stage 3: Secondary | ICD-10-CM | POA: Diagnosis not present

## 2019-01-20 DIAGNOSIS — R634 Abnormal weight loss: Secondary | ICD-10-CM | POA: Diagnosis not present

## 2019-01-20 DIAGNOSIS — I872 Venous insufficiency (chronic) (peripheral): Secondary | ICD-10-CM | POA: Diagnosis not present

## 2019-01-20 DIAGNOSIS — I890 Lymphedema, not elsewhere classified: Secondary | ICD-10-CM | POA: Diagnosis not present

## 2019-01-21 DIAGNOSIS — L89153 Pressure ulcer of sacral region, stage 3: Secondary | ICD-10-CM | POA: Diagnosis not present

## 2019-01-21 DIAGNOSIS — I890 Lymphedema, not elsewhere classified: Secondary | ICD-10-CM | POA: Diagnosis not present

## 2019-01-21 DIAGNOSIS — L97819 Non-pressure chronic ulcer of other part of right lower leg with unspecified severity: Secondary | ICD-10-CM | POA: Diagnosis not present

## 2019-01-21 DIAGNOSIS — R634 Abnormal weight loss: Secondary | ICD-10-CM | POA: Diagnosis not present

## 2019-01-21 DIAGNOSIS — I70299 Other atherosclerosis of native arteries of extremities, unspecified extremity: Secondary | ICD-10-CM | POA: Diagnosis not present

## 2019-01-21 DIAGNOSIS — I872 Venous insufficiency (chronic) (peripheral): Secondary | ICD-10-CM | POA: Diagnosis not present

## 2019-01-23 DIAGNOSIS — I872 Venous insufficiency (chronic) (peripheral): Secondary | ICD-10-CM | POA: Diagnosis not present

## 2019-01-23 DIAGNOSIS — I70299 Other atherosclerosis of native arteries of extremities, unspecified extremity: Secondary | ICD-10-CM | POA: Diagnosis not present

## 2019-01-23 DIAGNOSIS — I890 Lymphedema, not elsewhere classified: Secondary | ICD-10-CM | POA: Diagnosis not present

## 2019-01-23 DIAGNOSIS — R600 Localized edema: Secondary | ICD-10-CM | POA: Diagnosis not present

## 2019-01-23 DIAGNOSIS — R634 Abnormal weight loss: Secondary | ICD-10-CM | POA: Diagnosis not present

## 2019-01-23 DIAGNOSIS — I10 Essential (primary) hypertension: Secondary | ICD-10-CM | POA: Diagnosis not present

## 2019-01-23 DIAGNOSIS — R5381 Other malaise: Secondary | ICD-10-CM | POA: Diagnosis not present

## 2019-01-23 DIAGNOSIS — L97819 Non-pressure chronic ulcer of other part of right lower leg with unspecified severity: Secondary | ICD-10-CM | POA: Diagnosis not present

## 2019-01-23 DIAGNOSIS — L89153 Pressure ulcer of sacral region, stage 3: Secondary | ICD-10-CM | POA: Diagnosis not present

## 2019-01-24 DIAGNOSIS — I70299 Other atherosclerosis of native arteries of extremities, unspecified extremity: Secondary | ICD-10-CM | POA: Diagnosis not present

## 2019-01-24 DIAGNOSIS — L89153 Pressure ulcer of sacral region, stage 3: Secondary | ICD-10-CM | POA: Diagnosis not present

## 2019-01-24 DIAGNOSIS — L97819 Non-pressure chronic ulcer of other part of right lower leg with unspecified severity: Secondary | ICD-10-CM | POA: Diagnosis not present

## 2019-01-24 DIAGNOSIS — I872 Venous insufficiency (chronic) (peripheral): Secondary | ICD-10-CM | POA: Diagnosis not present

## 2019-01-24 DIAGNOSIS — R634 Abnormal weight loss: Secondary | ICD-10-CM | POA: Diagnosis not present

## 2019-01-24 DIAGNOSIS — I890 Lymphedema, not elsewhere classified: Secondary | ICD-10-CM | POA: Diagnosis not present

## 2019-01-26 DIAGNOSIS — I890 Lymphedema, not elsewhere classified: Secondary | ICD-10-CM | POA: Diagnosis not present

## 2019-01-26 DIAGNOSIS — I872 Venous insufficiency (chronic) (peripheral): Secondary | ICD-10-CM | POA: Diagnosis not present

## 2019-01-26 DIAGNOSIS — I70299 Other atherosclerosis of native arteries of extremities, unspecified extremity: Secondary | ICD-10-CM | POA: Diagnosis not present

## 2019-01-26 DIAGNOSIS — L97819 Non-pressure chronic ulcer of other part of right lower leg with unspecified severity: Secondary | ICD-10-CM | POA: Diagnosis not present

## 2019-01-26 DIAGNOSIS — R634 Abnormal weight loss: Secondary | ICD-10-CM | POA: Diagnosis not present

## 2019-01-26 DIAGNOSIS — L89153 Pressure ulcer of sacral region, stage 3: Secondary | ICD-10-CM | POA: Diagnosis not present

## 2019-01-27 DIAGNOSIS — I739 Peripheral vascular disease, unspecified: Secondary | ICD-10-CM | POA: Diagnosis not present

## 2019-01-27 DIAGNOSIS — B351 Tinea unguium: Secondary | ICD-10-CM | POA: Diagnosis not present

## 2019-01-27 DIAGNOSIS — B353 Tinea pedis: Secondary | ICD-10-CM | POA: Diagnosis not present

## 2019-01-27 DIAGNOSIS — M79674 Pain in right toe(s): Secondary | ICD-10-CM | POA: Diagnosis not present

## 2019-01-27 DIAGNOSIS — L600 Ingrowing nail: Secondary | ICD-10-CM | POA: Diagnosis not present

## 2019-01-27 DIAGNOSIS — M2042 Other hammer toe(s) (acquired), left foot: Secondary | ICD-10-CM | POA: Diagnosis not present

## 2019-01-28 DIAGNOSIS — R634 Abnormal weight loss: Secondary | ICD-10-CM | POA: Diagnosis not present

## 2019-01-28 DIAGNOSIS — L97819 Non-pressure chronic ulcer of other part of right lower leg with unspecified severity: Secondary | ICD-10-CM | POA: Diagnosis not present

## 2019-01-28 DIAGNOSIS — I872 Venous insufficiency (chronic) (peripheral): Secondary | ICD-10-CM | POA: Diagnosis not present

## 2019-01-28 DIAGNOSIS — L89153 Pressure ulcer of sacral region, stage 3: Secondary | ICD-10-CM | POA: Diagnosis not present

## 2019-01-28 DIAGNOSIS — I890 Lymphedema, not elsewhere classified: Secondary | ICD-10-CM | POA: Diagnosis not present

## 2019-01-28 DIAGNOSIS — I70299 Other atherosclerosis of native arteries of extremities, unspecified extremity: Secondary | ICD-10-CM | POA: Diagnosis not present

## 2019-01-29 DIAGNOSIS — L97819 Non-pressure chronic ulcer of other part of right lower leg with unspecified severity: Secondary | ICD-10-CM | POA: Diagnosis not present

## 2019-01-29 DIAGNOSIS — I70299 Other atherosclerosis of native arteries of extremities, unspecified extremity: Secondary | ICD-10-CM | POA: Diagnosis not present

## 2019-01-29 DIAGNOSIS — I872 Venous insufficiency (chronic) (peripheral): Secondary | ICD-10-CM | POA: Diagnosis not present

## 2019-01-29 DIAGNOSIS — R634 Abnormal weight loss: Secondary | ICD-10-CM | POA: Diagnosis not present

## 2019-01-29 DIAGNOSIS — L89153 Pressure ulcer of sacral region, stage 3: Secondary | ICD-10-CM | POA: Diagnosis not present

## 2019-01-29 DIAGNOSIS — I890 Lymphedema, not elsewhere classified: Secondary | ICD-10-CM | POA: Diagnosis not present

## 2019-01-30 DIAGNOSIS — R634 Abnormal weight loss: Secondary | ICD-10-CM | POA: Diagnosis not present

## 2019-01-30 DIAGNOSIS — I872 Venous insufficiency (chronic) (peripheral): Secondary | ICD-10-CM | POA: Diagnosis not present

## 2019-01-30 DIAGNOSIS — I890 Lymphedema, not elsewhere classified: Secondary | ICD-10-CM | POA: Diagnosis not present

## 2019-01-30 DIAGNOSIS — I70299 Other atherosclerosis of native arteries of extremities, unspecified extremity: Secondary | ICD-10-CM | POA: Diagnosis not present

## 2019-01-30 DIAGNOSIS — L89153 Pressure ulcer of sacral region, stage 3: Secondary | ICD-10-CM | POA: Diagnosis not present

## 2019-01-30 DIAGNOSIS — L97819 Non-pressure chronic ulcer of other part of right lower leg with unspecified severity: Secondary | ICD-10-CM | POA: Diagnosis not present

## 2019-01-31 DIAGNOSIS — I890 Lymphedema, not elsewhere classified: Secondary | ICD-10-CM | POA: Diagnosis not present

## 2019-01-31 DIAGNOSIS — R634 Abnormal weight loss: Secondary | ICD-10-CM | POA: Diagnosis not present

## 2019-01-31 DIAGNOSIS — L97819 Non-pressure chronic ulcer of other part of right lower leg with unspecified severity: Secondary | ICD-10-CM | POA: Diagnosis not present

## 2019-01-31 DIAGNOSIS — I872 Venous insufficiency (chronic) (peripheral): Secondary | ICD-10-CM | POA: Diagnosis not present

## 2019-01-31 DIAGNOSIS — L89153 Pressure ulcer of sacral region, stage 3: Secondary | ICD-10-CM | POA: Diagnosis not present

## 2019-01-31 DIAGNOSIS — I70299 Other atherosclerosis of native arteries of extremities, unspecified extremity: Secondary | ICD-10-CM | POA: Diagnosis not present

## 2019-02-01 DIAGNOSIS — I872 Venous insufficiency (chronic) (peripheral): Secondary | ICD-10-CM | POA: Diagnosis not present

## 2019-02-01 DIAGNOSIS — L97819 Non-pressure chronic ulcer of other part of right lower leg with unspecified severity: Secondary | ICD-10-CM | POA: Diagnosis not present

## 2019-02-01 DIAGNOSIS — I890 Lymphedema, not elsewhere classified: Secondary | ICD-10-CM | POA: Diagnosis not present

## 2019-02-01 DIAGNOSIS — L89153 Pressure ulcer of sacral region, stage 3: Secondary | ICD-10-CM | POA: Diagnosis not present

## 2019-02-01 DIAGNOSIS — I70299 Other atherosclerosis of native arteries of extremities, unspecified extremity: Secondary | ICD-10-CM | POA: Diagnosis not present

## 2019-02-01 DIAGNOSIS — R634 Abnormal weight loss: Secondary | ICD-10-CM | POA: Diagnosis not present

## 2019-02-02 DIAGNOSIS — R634 Abnormal weight loss: Secondary | ICD-10-CM | POA: Diagnosis not present

## 2019-02-02 DIAGNOSIS — I70299 Other atherosclerosis of native arteries of extremities, unspecified extremity: Secondary | ICD-10-CM | POA: Diagnosis not present

## 2019-02-02 DIAGNOSIS — I872 Venous insufficiency (chronic) (peripheral): Secondary | ICD-10-CM | POA: Diagnosis not present

## 2019-02-02 DIAGNOSIS — L97819 Non-pressure chronic ulcer of other part of right lower leg with unspecified severity: Secondary | ICD-10-CM | POA: Diagnosis not present

## 2019-02-02 DIAGNOSIS — I890 Lymphedema, not elsewhere classified: Secondary | ICD-10-CM | POA: Diagnosis not present

## 2019-02-02 DIAGNOSIS — L89153 Pressure ulcer of sacral region, stage 3: Secondary | ICD-10-CM | POA: Diagnosis not present

## 2019-02-04 DIAGNOSIS — I872 Venous insufficiency (chronic) (peripheral): Secondary | ICD-10-CM | POA: Diagnosis not present

## 2019-02-04 DIAGNOSIS — R634 Abnormal weight loss: Secondary | ICD-10-CM | POA: Diagnosis not present

## 2019-02-04 DIAGNOSIS — L97819 Non-pressure chronic ulcer of other part of right lower leg with unspecified severity: Secondary | ICD-10-CM | POA: Diagnosis not present

## 2019-02-04 DIAGNOSIS — L89153 Pressure ulcer of sacral region, stage 3: Secondary | ICD-10-CM | POA: Diagnosis not present

## 2019-02-04 DIAGNOSIS — I70299 Other atherosclerosis of native arteries of extremities, unspecified extremity: Secondary | ICD-10-CM | POA: Diagnosis not present

## 2019-02-04 DIAGNOSIS — I890 Lymphedema, not elsewhere classified: Secondary | ICD-10-CM | POA: Diagnosis not present

## 2019-02-06 DIAGNOSIS — L97819 Non-pressure chronic ulcer of other part of right lower leg with unspecified severity: Secondary | ICD-10-CM | POA: Diagnosis not present

## 2019-02-06 DIAGNOSIS — R634 Abnormal weight loss: Secondary | ICD-10-CM | POA: Diagnosis not present

## 2019-02-06 DIAGNOSIS — I70299 Other atherosclerosis of native arteries of extremities, unspecified extremity: Secondary | ICD-10-CM | POA: Diagnosis not present

## 2019-02-06 DIAGNOSIS — L89153 Pressure ulcer of sacral region, stage 3: Secondary | ICD-10-CM | POA: Diagnosis not present

## 2019-02-06 DIAGNOSIS — I890 Lymphedema, not elsewhere classified: Secondary | ICD-10-CM | POA: Diagnosis not present

## 2019-02-06 DIAGNOSIS — I872 Venous insufficiency (chronic) (peripheral): Secondary | ICD-10-CM | POA: Diagnosis not present

## 2019-02-07 DIAGNOSIS — I70299 Other atherosclerosis of native arteries of extremities, unspecified extremity: Secondary | ICD-10-CM | POA: Diagnosis not present

## 2019-02-07 DIAGNOSIS — I872 Venous insufficiency (chronic) (peripheral): Secondary | ICD-10-CM | POA: Diagnosis not present

## 2019-02-07 DIAGNOSIS — L89153 Pressure ulcer of sacral region, stage 3: Secondary | ICD-10-CM | POA: Diagnosis not present

## 2019-02-07 DIAGNOSIS — I890 Lymphedema, not elsewhere classified: Secondary | ICD-10-CM | POA: Diagnosis not present

## 2019-02-07 DIAGNOSIS — R634 Abnormal weight loss: Secondary | ICD-10-CM | POA: Diagnosis not present

## 2019-02-07 DIAGNOSIS — L97819 Non-pressure chronic ulcer of other part of right lower leg with unspecified severity: Secondary | ICD-10-CM | POA: Diagnosis not present

## 2019-02-10 DIAGNOSIS — L97819 Non-pressure chronic ulcer of other part of right lower leg with unspecified severity: Secondary | ICD-10-CM | POA: Diagnosis not present

## 2019-02-10 DIAGNOSIS — I872 Venous insufficiency (chronic) (peripheral): Secondary | ICD-10-CM | POA: Diagnosis not present

## 2019-02-10 DIAGNOSIS — I70299 Other atherosclerosis of native arteries of extremities, unspecified extremity: Secondary | ICD-10-CM | POA: Diagnosis not present

## 2019-02-10 DIAGNOSIS — I890 Lymphedema, not elsewhere classified: Secondary | ICD-10-CM | POA: Diagnosis not present

## 2019-02-10 DIAGNOSIS — R634 Abnormal weight loss: Secondary | ICD-10-CM | POA: Diagnosis not present

## 2019-02-10 DIAGNOSIS — L89153 Pressure ulcer of sacral region, stage 3: Secondary | ICD-10-CM | POA: Diagnosis not present

## 2019-02-11 DIAGNOSIS — I872 Venous insufficiency (chronic) (peripheral): Secondary | ICD-10-CM | POA: Diagnosis not present

## 2019-02-11 DIAGNOSIS — L89153 Pressure ulcer of sacral region, stage 3: Secondary | ICD-10-CM | POA: Diagnosis not present

## 2019-02-11 DIAGNOSIS — R634 Abnormal weight loss: Secondary | ICD-10-CM | POA: Diagnosis not present

## 2019-02-11 DIAGNOSIS — I70299 Other atherosclerosis of native arteries of extremities, unspecified extremity: Secondary | ICD-10-CM | POA: Diagnosis not present

## 2019-02-11 DIAGNOSIS — L97819 Non-pressure chronic ulcer of other part of right lower leg with unspecified severity: Secondary | ICD-10-CM | POA: Diagnosis not present

## 2019-02-11 DIAGNOSIS — I890 Lymphedema, not elsewhere classified: Secondary | ICD-10-CM | POA: Diagnosis not present

## 2019-02-13 DIAGNOSIS — I890 Lymphedema, not elsewhere classified: Secondary | ICD-10-CM | POA: Diagnosis not present

## 2019-02-13 DIAGNOSIS — I872 Venous insufficiency (chronic) (peripheral): Secondary | ICD-10-CM | POA: Diagnosis not present

## 2019-02-13 DIAGNOSIS — I70299 Other atherosclerosis of native arteries of extremities, unspecified extremity: Secondary | ICD-10-CM | POA: Diagnosis not present

## 2019-02-13 DIAGNOSIS — R634 Abnormal weight loss: Secondary | ICD-10-CM | POA: Diagnosis not present

## 2019-02-13 DIAGNOSIS — L97819 Non-pressure chronic ulcer of other part of right lower leg with unspecified severity: Secondary | ICD-10-CM | POA: Diagnosis not present

## 2019-02-13 DIAGNOSIS — L89153 Pressure ulcer of sacral region, stage 3: Secondary | ICD-10-CM | POA: Diagnosis not present

## 2019-02-14 DIAGNOSIS — L89153 Pressure ulcer of sacral region, stage 3: Secondary | ICD-10-CM | POA: Diagnosis not present

## 2019-02-14 DIAGNOSIS — R634 Abnormal weight loss: Secondary | ICD-10-CM | POA: Diagnosis not present

## 2019-02-14 DIAGNOSIS — I890 Lymphedema, not elsewhere classified: Secondary | ICD-10-CM | POA: Diagnosis not present

## 2019-02-14 DIAGNOSIS — I70299 Other atherosclerosis of native arteries of extremities, unspecified extremity: Secondary | ICD-10-CM | POA: Diagnosis not present

## 2019-02-14 DIAGNOSIS — L97819 Non-pressure chronic ulcer of other part of right lower leg with unspecified severity: Secondary | ICD-10-CM | POA: Diagnosis not present

## 2019-02-14 DIAGNOSIS — I872 Venous insufficiency (chronic) (peripheral): Secondary | ICD-10-CM | POA: Diagnosis not present

## 2019-02-17 DIAGNOSIS — I70299 Other atherosclerosis of native arteries of extremities, unspecified extremity: Secondary | ICD-10-CM | POA: Diagnosis not present

## 2019-02-17 DIAGNOSIS — I872 Venous insufficiency (chronic) (peripheral): Secondary | ICD-10-CM | POA: Diagnosis not present

## 2019-02-17 DIAGNOSIS — L97819 Non-pressure chronic ulcer of other part of right lower leg with unspecified severity: Secondary | ICD-10-CM | POA: Diagnosis not present

## 2019-02-17 DIAGNOSIS — L89153 Pressure ulcer of sacral region, stage 3: Secondary | ICD-10-CM | POA: Diagnosis not present

## 2019-02-17 DIAGNOSIS — R634 Abnormal weight loss: Secondary | ICD-10-CM | POA: Diagnosis not present

## 2019-02-17 DIAGNOSIS — I890 Lymphedema, not elsewhere classified: Secondary | ICD-10-CM | POA: Diagnosis not present

## 2019-02-18 DIAGNOSIS — R634 Abnormal weight loss: Secondary | ICD-10-CM | POA: Diagnosis not present

## 2019-02-18 DIAGNOSIS — L97819 Non-pressure chronic ulcer of other part of right lower leg with unspecified severity: Secondary | ICD-10-CM | POA: Diagnosis not present

## 2019-02-18 DIAGNOSIS — I890 Lymphedema, not elsewhere classified: Secondary | ICD-10-CM | POA: Diagnosis not present

## 2019-02-18 DIAGNOSIS — L89153 Pressure ulcer of sacral region, stage 3: Secondary | ICD-10-CM | POA: Diagnosis not present

## 2019-02-18 DIAGNOSIS — I872 Venous insufficiency (chronic) (peripheral): Secondary | ICD-10-CM | POA: Diagnosis not present

## 2019-02-18 DIAGNOSIS — I70299 Other atherosclerosis of native arteries of extremities, unspecified extremity: Secondary | ICD-10-CM | POA: Diagnosis not present

## 2019-02-19 DIAGNOSIS — I890 Lymphedema, not elsewhere classified: Secondary | ICD-10-CM | POA: Diagnosis not present

## 2019-02-19 DIAGNOSIS — L97819 Non-pressure chronic ulcer of other part of right lower leg with unspecified severity: Secondary | ICD-10-CM | POA: Diagnosis not present

## 2019-02-19 DIAGNOSIS — L89153 Pressure ulcer of sacral region, stage 3: Secondary | ICD-10-CM | POA: Diagnosis not present

## 2019-02-19 DIAGNOSIS — I872 Venous insufficiency (chronic) (peripheral): Secondary | ICD-10-CM | POA: Diagnosis not present

## 2019-02-19 DIAGNOSIS — R634 Abnormal weight loss: Secondary | ICD-10-CM | POA: Diagnosis not present

## 2019-02-19 DIAGNOSIS — I70299 Other atherosclerosis of native arteries of extremities, unspecified extremity: Secondary | ICD-10-CM | POA: Diagnosis not present

## 2019-02-20 DIAGNOSIS — L89153 Pressure ulcer of sacral region, stage 3: Secondary | ICD-10-CM | POA: Diagnosis not present

## 2019-02-20 DIAGNOSIS — I890 Lymphedema, not elsewhere classified: Secondary | ICD-10-CM | POA: Diagnosis not present

## 2019-02-20 DIAGNOSIS — R634 Abnormal weight loss: Secondary | ICD-10-CM | POA: Diagnosis not present

## 2019-02-20 DIAGNOSIS — I872 Venous insufficiency (chronic) (peripheral): Secondary | ICD-10-CM | POA: Diagnosis not present

## 2019-02-20 DIAGNOSIS — L97819 Non-pressure chronic ulcer of other part of right lower leg with unspecified severity: Secondary | ICD-10-CM | POA: Diagnosis not present

## 2019-02-20 DIAGNOSIS — I70299 Other atherosclerosis of native arteries of extremities, unspecified extremity: Secondary | ICD-10-CM | POA: Diagnosis not present

## 2019-02-21 DIAGNOSIS — L89153 Pressure ulcer of sacral region, stage 3: Secondary | ICD-10-CM | POA: Diagnosis not present

## 2019-02-21 DIAGNOSIS — I890 Lymphedema, not elsewhere classified: Secondary | ICD-10-CM | POA: Diagnosis not present

## 2019-02-21 DIAGNOSIS — I70299 Other atherosclerosis of native arteries of extremities, unspecified extremity: Secondary | ICD-10-CM | POA: Diagnosis not present

## 2019-02-21 DIAGNOSIS — L97819 Non-pressure chronic ulcer of other part of right lower leg with unspecified severity: Secondary | ICD-10-CM | POA: Diagnosis not present

## 2019-02-21 DIAGNOSIS — I872 Venous insufficiency (chronic) (peripheral): Secondary | ICD-10-CM | POA: Diagnosis not present

## 2019-02-21 DIAGNOSIS — R634 Abnormal weight loss: Secondary | ICD-10-CM | POA: Diagnosis not present

## 2019-02-22 DIAGNOSIS — I70299 Other atherosclerosis of native arteries of extremities, unspecified extremity: Secondary | ICD-10-CM | POA: Diagnosis not present

## 2019-02-22 DIAGNOSIS — R634 Abnormal weight loss: Secondary | ICD-10-CM | POA: Diagnosis not present

## 2019-02-22 DIAGNOSIS — L89153 Pressure ulcer of sacral region, stage 3: Secondary | ICD-10-CM | POA: Diagnosis not present

## 2019-02-22 DIAGNOSIS — L97819 Non-pressure chronic ulcer of other part of right lower leg with unspecified severity: Secondary | ICD-10-CM | POA: Diagnosis not present

## 2019-02-22 DIAGNOSIS — I890 Lymphedema, not elsewhere classified: Secondary | ICD-10-CM | POA: Diagnosis not present

## 2019-02-22 DIAGNOSIS — I872 Venous insufficiency (chronic) (peripheral): Secondary | ICD-10-CM | POA: Diagnosis not present

## 2019-02-23 DIAGNOSIS — R634 Abnormal weight loss: Secondary | ICD-10-CM | POA: Diagnosis not present

## 2019-02-23 DIAGNOSIS — I70299 Other atherosclerosis of native arteries of extremities, unspecified extremity: Secondary | ICD-10-CM | POA: Diagnosis not present

## 2019-02-23 DIAGNOSIS — L97819 Non-pressure chronic ulcer of other part of right lower leg with unspecified severity: Secondary | ICD-10-CM | POA: Diagnosis not present

## 2019-02-23 DIAGNOSIS — R600 Localized edema: Secondary | ICD-10-CM | POA: Diagnosis not present

## 2019-02-23 DIAGNOSIS — R5381 Other malaise: Secondary | ICD-10-CM | POA: Diagnosis not present

## 2019-02-23 DIAGNOSIS — I890 Lymphedema, not elsewhere classified: Secondary | ICD-10-CM | POA: Diagnosis not present

## 2019-02-23 DIAGNOSIS — I10 Essential (primary) hypertension: Secondary | ICD-10-CM | POA: Diagnosis not present

## 2019-02-23 DIAGNOSIS — I872 Venous insufficiency (chronic) (peripheral): Secondary | ICD-10-CM | POA: Diagnosis not present

## 2019-02-25 DIAGNOSIS — L97819 Non-pressure chronic ulcer of other part of right lower leg with unspecified severity: Secondary | ICD-10-CM | POA: Diagnosis not present

## 2019-02-25 DIAGNOSIS — I872 Venous insufficiency (chronic) (peripheral): Secondary | ICD-10-CM | POA: Diagnosis not present

## 2019-02-25 DIAGNOSIS — R600 Localized edema: Secondary | ICD-10-CM | POA: Diagnosis not present

## 2019-02-25 DIAGNOSIS — I70299 Other atherosclerosis of native arteries of extremities, unspecified extremity: Secondary | ICD-10-CM | POA: Diagnosis not present

## 2019-02-25 DIAGNOSIS — I890 Lymphedema, not elsewhere classified: Secondary | ICD-10-CM | POA: Diagnosis not present

## 2019-02-25 DIAGNOSIS — R634 Abnormal weight loss: Secondary | ICD-10-CM | POA: Diagnosis not present

## 2019-02-27 DIAGNOSIS — R600 Localized edema: Secondary | ICD-10-CM | POA: Diagnosis not present

## 2019-02-27 DIAGNOSIS — I70299 Other atherosclerosis of native arteries of extremities, unspecified extremity: Secondary | ICD-10-CM | POA: Diagnosis not present

## 2019-02-27 DIAGNOSIS — L97819 Non-pressure chronic ulcer of other part of right lower leg with unspecified severity: Secondary | ICD-10-CM | POA: Diagnosis not present

## 2019-02-27 DIAGNOSIS — I872 Venous insufficiency (chronic) (peripheral): Secondary | ICD-10-CM | POA: Diagnosis not present

## 2019-02-27 DIAGNOSIS — R634 Abnormal weight loss: Secondary | ICD-10-CM | POA: Diagnosis not present

## 2019-02-27 DIAGNOSIS — I890 Lymphedema, not elsewhere classified: Secondary | ICD-10-CM | POA: Diagnosis not present

## 2019-02-28 DIAGNOSIS — I872 Venous insufficiency (chronic) (peripheral): Secondary | ICD-10-CM | POA: Diagnosis not present

## 2019-02-28 DIAGNOSIS — I70299 Other atherosclerosis of native arteries of extremities, unspecified extremity: Secondary | ICD-10-CM | POA: Diagnosis not present

## 2019-02-28 DIAGNOSIS — I890 Lymphedema, not elsewhere classified: Secondary | ICD-10-CM | POA: Diagnosis not present

## 2019-02-28 DIAGNOSIS — L97819 Non-pressure chronic ulcer of other part of right lower leg with unspecified severity: Secondary | ICD-10-CM | POA: Diagnosis not present

## 2019-02-28 DIAGNOSIS — R600 Localized edema: Secondary | ICD-10-CM | POA: Diagnosis not present

## 2019-02-28 DIAGNOSIS — R634 Abnormal weight loss: Secondary | ICD-10-CM | POA: Diagnosis not present

## 2019-03-02 DIAGNOSIS — I890 Lymphedema, not elsewhere classified: Secondary | ICD-10-CM | POA: Diagnosis not present

## 2019-03-02 DIAGNOSIS — L97819 Non-pressure chronic ulcer of other part of right lower leg with unspecified severity: Secondary | ICD-10-CM | POA: Diagnosis not present

## 2019-03-02 DIAGNOSIS — R634 Abnormal weight loss: Secondary | ICD-10-CM | POA: Diagnosis not present

## 2019-03-02 DIAGNOSIS — I872 Venous insufficiency (chronic) (peripheral): Secondary | ICD-10-CM | POA: Diagnosis not present

## 2019-03-02 DIAGNOSIS — R600 Localized edema: Secondary | ICD-10-CM | POA: Diagnosis not present

## 2019-03-02 DIAGNOSIS — I70299 Other atherosclerosis of native arteries of extremities, unspecified extremity: Secondary | ICD-10-CM | POA: Diagnosis not present

## 2019-03-04 DIAGNOSIS — L97819 Non-pressure chronic ulcer of other part of right lower leg with unspecified severity: Secondary | ICD-10-CM | POA: Diagnosis not present

## 2019-03-04 DIAGNOSIS — I890 Lymphedema, not elsewhere classified: Secondary | ICD-10-CM | POA: Diagnosis not present

## 2019-03-04 DIAGNOSIS — R600 Localized edema: Secondary | ICD-10-CM | POA: Diagnosis not present

## 2019-03-04 DIAGNOSIS — R634 Abnormal weight loss: Secondary | ICD-10-CM | POA: Diagnosis not present

## 2019-03-04 DIAGNOSIS — I872 Venous insufficiency (chronic) (peripheral): Secondary | ICD-10-CM | POA: Diagnosis not present

## 2019-03-04 DIAGNOSIS — I70299 Other atherosclerosis of native arteries of extremities, unspecified extremity: Secondary | ICD-10-CM | POA: Diagnosis not present

## 2019-03-06 DIAGNOSIS — L97819 Non-pressure chronic ulcer of other part of right lower leg with unspecified severity: Secondary | ICD-10-CM | POA: Diagnosis not present

## 2019-03-06 DIAGNOSIS — I890 Lymphedema, not elsewhere classified: Secondary | ICD-10-CM | POA: Diagnosis not present

## 2019-03-06 DIAGNOSIS — I70299 Other atherosclerosis of native arteries of extremities, unspecified extremity: Secondary | ICD-10-CM | POA: Diagnosis not present

## 2019-03-06 DIAGNOSIS — I872 Venous insufficiency (chronic) (peripheral): Secondary | ICD-10-CM | POA: Diagnosis not present

## 2019-03-06 DIAGNOSIS — R600 Localized edema: Secondary | ICD-10-CM | POA: Diagnosis not present

## 2019-03-06 DIAGNOSIS — R634 Abnormal weight loss: Secondary | ICD-10-CM | POA: Diagnosis not present

## 2019-03-07 DIAGNOSIS — I70299 Other atherosclerosis of native arteries of extremities, unspecified extremity: Secondary | ICD-10-CM | POA: Diagnosis not present

## 2019-03-07 DIAGNOSIS — I872 Venous insufficiency (chronic) (peripheral): Secondary | ICD-10-CM | POA: Diagnosis not present

## 2019-03-07 DIAGNOSIS — R600 Localized edema: Secondary | ICD-10-CM | POA: Diagnosis not present

## 2019-03-07 DIAGNOSIS — I890 Lymphedema, not elsewhere classified: Secondary | ICD-10-CM | POA: Diagnosis not present

## 2019-03-07 DIAGNOSIS — R634 Abnormal weight loss: Secondary | ICD-10-CM | POA: Diagnosis not present

## 2019-03-07 DIAGNOSIS — L97819 Non-pressure chronic ulcer of other part of right lower leg with unspecified severity: Secondary | ICD-10-CM | POA: Diagnosis not present

## 2019-03-09 DIAGNOSIS — I890 Lymphedema, not elsewhere classified: Secondary | ICD-10-CM | POA: Diagnosis not present

## 2019-03-09 DIAGNOSIS — I872 Venous insufficiency (chronic) (peripheral): Secondary | ICD-10-CM | POA: Diagnosis not present

## 2019-03-09 DIAGNOSIS — I70299 Other atherosclerosis of native arteries of extremities, unspecified extremity: Secondary | ICD-10-CM | POA: Diagnosis not present

## 2019-03-09 DIAGNOSIS — R600 Localized edema: Secondary | ICD-10-CM | POA: Diagnosis not present

## 2019-03-09 DIAGNOSIS — L97819 Non-pressure chronic ulcer of other part of right lower leg with unspecified severity: Secondary | ICD-10-CM | POA: Diagnosis not present

## 2019-03-09 DIAGNOSIS — R634 Abnormal weight loss: Secondary | ICD-10-CM | POA: Diagnosis not present

## 2019-03-10 DIAGNOSIS — I70299 Other atherosclerosis of native arteries of extremities, unspecified extremity: Secondary | ICD-10-CM | POA: Diagnosis not present

## 2019-03-10 DIAGNOSIS — R600 Localized edema: Secondary | ICD-10-CM | POA: Diagnosis not present

## 2019-03-10 DIAGNOSIS — I872 Venous insufficiency (chronic) (peripheral): Secondary | ICD-10-CM | POA: Diagnosis not present

## 2019-03-10 DIAGNOSIS — I890 Lymphedema, not elsewhere classified: Secondary | ICD-10-CM | POA: Diagnosis not present

## 2019-03-10 DIAGNOSIS — L97819 Non-pressure chronic ulcer of other part of right lower leg with unspecified severity: Secondary | ICD-10-CM | POA: Diagnosis not present

## 2019-03-10 DIAGNOSIS — R634 Abnormal weight loss: Secondary | ICD-10-CM | POA: Diagnosis not present

## 2019-03-11 DIAGNOSIS — I70299 Other atherosclerosis of native arteries of extremities, unspecified extremity: Secondary | ICD-10-CM | POA: Diagnosis not present

## 2019-03-11 DIAGNOSIS — I890 Lymphedema, not elsewhere classified: Secondary | ICD-10-CM | POA: Diagnosis not present

## 2019-03-11 DIAGNOSIS — L97819 Non-pressure chronic ulcer of other part of right lower leg with unspecified severity: Secondary | ICD-10-CM | POA: Diagnosis not present

## 2019-03-11 DIAGNOSIS — I872 Venous insufficiency (chronic) (peripheral): Secondary | ICD-10-CM | POA: Diagnosis not present

## 2019-03-11 DIAGNOSIS — R600 Localized edema: Secondary | ICD-10-CM | POA: Diagnosis not present

## 2019-03-11 DIAGNOSIS — R634 Abnormal weight loss: Secondary | ICD-10-CM | POA: Diagnosis not present

## 2019-03-12 DIAGNOSIS — I872 Venous insufficiency (chronic) (peripheral): Secondary | ICD-10-CM | POA: Diagnosis not present

## 2019-03-12 DIAGNOSIS — R600 Localized edema: Secondary | ICD-10-CM | POA: Diagnosis not present

## 2019-03-12 DIAGNOSIS — I70299 Other atherosclerosis of native arteries of extremities, unspecified extremity: Secondary | ICD-10-CM | POA: Diagnosis not present

## 2019-03-12 DIAGNOSIS — I890 Lymphedema, not elsewhere classified: Secondary | ICD-10-CM | POA: Diagnosis not present

## 2019-03-12 DIAGNOSIS — R634 Abnormal weight loss: Secondary | ICD-10-CM | POA: Diagnosis not present

## 2019-03-12 DIAGNOSIS — L97819 Non-pressure chronic ulcer of other part of right lower leg with unspecified severity: Secondary | ICD-10-CM | POA: Diagnosis not present

## 2019-03-14 DIAGNOSIS — I890 Lymphedema, not elsewhere classified: Secondary | ICD-10-CM | POA: Diagnosis not present

## 2019-03-14 DIAGNOSIS — L97819 Non-pressure chronic ulcer of other part of right lower leg with unspecified severity: Secondary | ICD-10-CM | POA: Diagnosis not present

## 2019-03-14 DIAGNOSIS — I872 Venous insufficiency (chronic) (peripheral): Secondary | ICD-10-CM | POA: Diagnosis not present

## 2019-03-14 DIAGNOSIS — R634 Abnormal weight loss: Secondary | ICD-10-CM | POA: Diagnosis not present

## 2019-03-14 DIAGNOSIS — I70299 Other atherosclerosis of native arteries of extremities, unspecified extremity: Secondary | ICD-10-CM | POA: Diagnosis not present

## 2019-03-14 DIAGNOSIS — R600 Localized edema: Secondary | ICD-10-CM | POA: Diagnosis not present

## 2019-03-17 DIAGNOSIS — I70299 Other atherosclerosis of native arteries of extremities, unspecified extremity: Secondary | ICD-10-CM | POA: Diagnosis not present

## 2019-03-17 DIAGNOSIS — I872 Venous insufficiency (chronic) (peripheral): Secondary | ICD-10-CM | POA: Diagnosis not present

## 2019-03-17 DIAGNOSIS — L97819 Non-pressure chronic ulcer of other part of right lower leg with unspecified severity: Secondary | ICD-10-CM | POA: Diagnosis not present

## 2019-03-17 DIAGNOSIS — R600 Localized edema: Secondary | ICD-10-CM | POA: Diagnosis not present

## 2019-03-17 DIAGNOSIS — R634 Abnormal weight loss: Secondary | ICD-10-CM | POA: Diagnosis not present

## 2019-03-17 DIAGNOSIS — I890 Lymphedema, not elsewhere classified: Secondary | ICD-10-CM | POA: Diagnosis not present

## 2019-03-18 DIAGNOSIS — I872 Venous insufficiency (chronic) (peripheral): Secondary | ICD-10-CM | POA: Diagnosis not present

## 2019-03-18 DIAGNOSIS — L97819 Non-pressure chronic ulcer of other part of right lower leg with unspecified severity: Secondary | ICD-10-CM | POA: Diagnosis not present

## 2019-03-18 DIAGNOSIS — I70299 Other atherosclerosis of native arteries of extremities, unspecified extremity: Secondary | ICD-10-CM | POA: Diagnosis not present

## 2019-03-18 DIAGNOSIS — R600 Localized edema: Secondary | ICD-10-CM | POA: Diagnosis not present

## 2019-03-18 DIAGNOSIS — R634 Abnormal weight loss: Secondary | ICD-10-CM | POA: Diagnosis not present

## 2019-03-18 DIAGNOSIS — I890 Lymphedema, not elsewhere classified: Secondary | ICD-10-CM | POA: Diagnosis not present

## 2019-03-20 DIAGNOSIS — R600 Localized edema: Secondary | ICD-10-CM | POA: Diagnosis not present

## 2019-03-20 DIAGNOSIS — R634 Abnormal weight loss: Secondary | ICD-10-CM | POA: Diagnosis not present

## 2019-03-20 DIAGNOSIS — I70299 Other atherosclerosis of native arteries of extremities, unspecified extremity: Secondary | ICD-10-CM | POA: Diagnosis not present

## 2019-03-20 DIAGNOSIS — L97819 Non-pressure chronic ulcer of other part of right lower leg with unspecified severity: Secondary | ICD-10-CM | POA: Diagnosis not present

## 2019-03-20 DIAGNOSIS — I872 Venous insufficiency (chronic) (peripheral): Secondary | ICD-10-CM | POA: Diagnosis not present

## 2019-03-20 DIAGNOSIS — I890 Lymphedema, not elsewhere classified: Secondary | ICD-10-CM | POA: Diagnosis not present

## 2019-03-21 DIAGNOSIS — R600 Localized edema: Secondary | ICD-10-CM | POA: Diagnosis not present

## 2019-03-21 DIAGNOSIS — R634 Abnormal weight loss: Secondary | ICD-10-CM | POA: Diagnosis not present

## 2019-03-21 DIAGNOSIS — L97819 Non-pressure chronic ulcer of other part of right lower leg with unspecified severity: Secondary | ICD-10-CM | POA: Diagnosis not present

## 2019-03-21 DIAGNOSIS — I890 Lymphedema, not elsewhere classified: Secondary | ICD-10-CM | POA: Diagnosis not present

## 2019-03-21 DIAGNOSIS — I872 Venous insufficiency (chronic) (peripheral): Secondary | ICD-10-CM | POA: Diagnosis not present

## 2019-03-21 DIAGNOSIS — I70299 Other atherosclerosis of native arteries of extremities, unspecified extremity: Secondary | ICD-10-CM | POA: Diagnosis not present

## 2019-03-22 DIAGNOSIS — L97819 Non-pressure chronic ulcer of other part of right lower leg with unspecified severity: Secondary | ICD-10-CM | POA: Diagnosis not present

## 2019-03-22 DIAGNOSIS — R600 Localized edema: Secondary | ICD-10-CM | POA: Diagnosis not present

## 2019-03-22 DIAGNOSIS — I890 Lymphedema, not elsewhere classified: Secondary | ICD-10-CM | POA: Diagnosis not present

## 2019-03-22 DIAGNOSIS — I70299 Other atherosclerosis of native arteries of extremities, unspecified extremity: Secondary | ICD-10-CM | POA: Diagnosis not present

## 2019-03-22 DIAGNOSIS — R634 Abnormal weight loss: Secondary | ICD-10-CM | POA: Diagnosis not present

## 2019-03-22 DIAGNOSIS — I872 Venous insufficiency (chronic) (peripheral): Secondary | ICD-10-CM | POA: Diagnosis not present

## 2019-03-23 DIAGNOSIS — R600 Localized edema: Secondary | ICD-10-CM | POA: Diagnosis not present

## 2019-03-23 DIAGNOSIS — I872 Venous insufficiency (chronic) (peripheral): Secondary | ICD-10-CM | POA: Diagnosis not present

## 2019-03-23 DIAGNOSIS — I70299 Other atherosclerosis of native arteries of extremities, unspecified extremity: Secondary | ICD-10-CM | POA: Diagnosis not present

## 2019-03-23 DIAGNOSIS — L97819 Non-pressure chronic ulcer of other part of right lower leg with unspecified severity: Secondary | ICD-10-CM | POA: Diagnosis not present

## 2019-03-23 DIAGNOSIS — R634 Abnormal weight loss: Secondary | ICD-10-CM | POA: Diagnosis not present

## 2019-03-23 DIAGNOSIS — I890 Lymphedema, not elsewhere classified: Secondary | ICD-10-CM | POA: Diagnosis not present

## 2019-03-24 DIAGNOSIS — R600 Localized edema: Secondary | ICD-10-CM | POA: Diagnosis not present

## 2019-03-24 DIAGNOSIS — L97819 Non-pressure chronic ulcer of other part of right lower leg with unspecified severity: Secondary | ICD-10-CM | POA: Diagnosis not present

## 2019-03-24 DIAGNOSIS — I70299 Other atherosclerosis of native arteries of extremities, unspecified extremity: Secondary | ICD-10-CM | POA: Diagnosis not present

## 2019-03-24 DIAGNOSIS — I890 Lymphedema, not elsewhere classified: Secondary | ICD-10-CM | POA: Diagnosis not present

## 2019-03-24 DIAGNOSIS — I872 Venous insufficiency (chronic) (peripheral): Secondary | ICD-10-CM | POA: Diagnosis not present

## 2019-03-24 DIAGNOSIS — R634 Abnormal weight loss: Secondary | ICD-10-CM | POA: Diagnosis not present

## 2019-03-25 DIAGNOSIS — R634 Abnormal weight loss: Secondary | ICD-10-CM | POA: Diagnosis not present

## 2019-03-25 DIAGNOSIS — I10 Essential (primary) hypertension: Secondary | ICD-10-CM | POA: Diagnosis not present

## 2019-03-25 DIAGNOSIS — I890 Lymphedema, not elsewhere classified: Secondary | ICD-10-CM | POA: Diagnosis not present

## 2019-03-25 DIAGNOSIS — L97819 Non-pressure chronic ulcer of other part of right lower leg with unspecified severity: Secondary | ICD-10-CM | POA: Diagnosis not present

## 2019-03-25 DIAGNOSIS — R5381 Other malaise: Secondary | ICD-10-CM | POA: Diagnosis not present

## 2019-03-25 DIAGNOSIS — I872 Venous insufficiency (chronic) (peripheral): Secondary | ICD-10-CM | POA: Diagnosis not present

## 2019-03-25 DIAGNOSIS — R600 Localized edema: Secondary | ICD-10-CM | POA: Diagnosis not present

## 2019-03-25 DIAGNOSIS — I70299 Other atherosclerosis of native arteries of extremities, unspecified extremity: Secondary | ICD-10-CM | POA: Diagnosis not present

## 2019-03-26 DIAGNOSIS — I872 Venous insufficiency (chronic) (peripheral): Secondary | ICD-10-CM | POA: Diagnosis not present

## 2019-03-26 DIAGNOSIS — I70299 Other atherosclerosis of native arteries of extremities, unspecified extremity: Secondary | ICD-10-CM | POA: Diagnosis not present

## 2019-03-26 DIAGNOSIS — I890 Lymphedema, not elsewhere classified: Secondary | ICD-10-CM | POA: Diagnosis not present

## 2019-03-26 DIAGNOSIS — R634 Abnormal weight loss: Secondary | ICD-10-CM | POA: Diagnosis not present

## 2019-03-26 DIAGNOSIS — R600 Localized edema: Secondary | ICD-10-CM | POA: Diagnosis not present

## 2019-03-26 DIAGNOSIS — L97819 Non-pressure chronic ulcer of other part of right lower leg with unspecified severity: Secondary | ICD-10-CM | POA: Diagnosis not present

## 2019-03-27 DIAGNOSIS — R600 Localized edema: Secondary | ICD-10-CM | POA: Diagnosis not present

## 2019-03-27 DIAGNOSIS — R634 Abnormal weight loss: Secondary | ICD-10-CM | POA: Diagnosis not present

## 2019-03-27 DIAGNOSIS — I872 Venous insufficiency (chronic) (peripheral): Secondary | ICD-10-CM | POA: Diagnosis not present

## 2019-03-27 DIAGNOSIS — L97819 Non-pressure chronic ulcer of other part of right lower leg with unspecified severity: Secondary | ICD-10-CM | POA: Diagnosis not present

## 2019-03-27 DIAGNOSIS — I70299 Other atherosclerosis of native arteries of extremities, unspecified extremity: Secondary | ICD-10-CM | POA: Diagnosis not present

## 2019-03-27 DIAGNOSIS — I890 Lymphedema, not elsewhere classified: Secondary | ICD-10-CM | POA: Diagnosis not present

## 2019-03-28 DIAGNOSIS — I890 Lymphedema, not elsewhere classified: Secondary | ICD-10-CM | POA: Diagnosis not present

## 2019-03-28 DIAGNOSIS — L97819 Non-pressure chronic ulcer of other part of right lower leg with unspecified severity: Secondary | ICD-10-CM | POA: Diagnosis not present

## 2019-03-28 DIAGNOSIS — I70299 Other atherosclerosis of native arteries of extremities, unspecified extremity: Secondary | ICD-10-CM | POA: Diagnosis not present

## 2019-03-28 DIAGNOSIS — I872 Venous insufficiency (chronic) (peripheral): Secondary | ICD-10-CM | POA: Diagnosis not present

## 2019-03-28 DIAGNOSIS — R600 Localized edema: Secondary | ICD-10-CM | POA: Diagnosis not present

## 2019-03-28 DIAGNOSIS — R634 Abnormal weight loss: Secondary | ICD-10-CM | POA: Diagnosis not present

## 2019-03-30 DIAGNOSIS — L97819 Non-pressure chronic ulcer of other part of right lower leg with unspecified severity: Secondary | ICD-10-CM | POA: Diagnosis not present

## 2019-03-30 DIAGNOSIS — I70299 Other atherosclerosis of native arteries of extremities, unspecified extremity: Secondary | ICD-10-CM | POA: Diagnosis not present

## 2019-03-30 DIAGNOSIS — I872 Venous insufficiency (chronic) (peripheral): Secondary | ICD-10-CM | POA: Diagnosis not present

## 2019-03-30 DIAGNOSIS — R634 Abnormal weight loss: Secondary | ICD-10-CM | POA: Diagnosis not present

## 2019-03-30 DIAGNOSIS — R600 Localized edema: Secondary | ICD-10-CM | POA: Diagnosis not present

## 2019-03-30 DIAGNOSIS — I890 Lymphedema, not elsewhere classified: Secondary | ICD-10-CM | POA: Diagnosis not present

## 2019-03-31 DIAGNOSIS — I872 Venous insufficiency (chronic) (peripheral): Secondary | ICD-10-CM | POA: Diagnosis not present

## 2019-03-31 DIAGNOSIS — R600 Localized edema: Secondary | ICD-10-CM | POA: Diagnosis not present

## 2019-03-31 DIAGNOSIS — L97819 Non-pressure chronic ulcer of other part of right lower leg with unspecified severity: Secondary | ICD-10-CM | POA: Diagnosis not present

## 2019-03-31 DIAGNOSIS — I890 Lymphedema, not elsewhere classified: Secondary | ICD-10-CM | POA: Diagnosis not present

## 2019-03-31 DIAGNOSIS — I70299 Other atherosclerosis of native arteries of extremities, unspecified extremity: Secondary | ICD-10-CM | POA: Diagnosis not present

## 2019-03-31 DIAGNOSIS — R634 Abnormal weight loss: Secondary | ICD-10-CM | POA: Diagnosis not present

## 2019-04-01 DIAGNOSIS — R634 Abnormal weight loss: Secondary | ICD-10-CM | POA: Diagnosis not present

## 2019-04-01 DIAGNOSIS — I70299 Other atherosclerosis of native arteries of extremities, unspecified extremity: Secondary | ICD-10-CM | POA: Diagnosis not present

## 2019-04-01 DIAGNOSIS — R600 Localized edema: Secondary | ICD-10-CM | POA: Diagnosis not present

## 2019-04-01 DIAGNOSIS — I890 Lymphedema, not elsewhere classified: Secondary | ICD-10-CM | POA: Diagnosis not present

## 2019-04-01 DIAGNOSIS — I872 Venous insufficiency (chronic) (peripheral): Secondary | ICD-10-CM | POA: Diagnosis not present

## 2019-04-01 DIAGNOSIS — L97819 Non-pressure chronic ulcer of other part of right lower leg with unspecified severity: Secondary | ICD-10-CM | POA: Diagnosis not present

## 2019-04-02 DIAGNOSIS — L97819 Non-pressure chronic ulcer of other part of right lower leg with unspecified severity: Secondary | ICD-10-CM | POA: Diagnosis not present

## 2019-04-02 DIAGNOSIS — R634 Abnormal weight loss: Secondary | ICD-10-CM | POA: Diagnosis not present

## 2019-04-02 DIAGNOSIS — I890 Lymphedema, not elsewhere classified: Secondary | ICD-10-CM | POA: Diagnosis not present

## 2019-04-02 DIAGNOSIS — I70299 Other atherosclerosis of native arteries of extremities, unspecified extremity: Secondary | ICD-10-CM | POA: Diagnosis not present

## 2019-04-02 DIAGNOSIS — I872 Venous insufficiency (chronic) (peripheral): Secondary | ICD-10-CM | POA: Diagnosis not present

## 2019-04-02 DIAGNOSIS — R600 Localized edema: Secondary | ICD-10-CM | POA: Diagnosis not present

## 2019-04-03 DIAGNOSIS — R634 Abnormal weight loss: Secondary | ICD-10-CM | POA: Diagnosis not present

## 2019-04-03 DIAGNOSIS — I70299 Other atherosclerosis of native arteries of extremities, unspecified extremity: Secondary | ICD-10-CM | POA: Diagnosis not present

## 2019-04-03 DIAGNOSIS — R600 Localized edema: Secondary | ICD-10-CM | POA: Diagnosis not present

## 2019-04-03 DIAGNOSIS — I890 Lymphedema, not elsewhere classified: Secondary | ICD-10-CM | POA: Diagnosis not present

## 2019-04-03 DIAGNOSIS — I872 Venous insufficiency (chronic) (peripheral): Secondary | ICD-10-CM | POA: Diagnosis not present

## 2019-04-03 DIAGNOSIS — L97819 Non-pressure chronic ulcer of other part of right lower leg with unspecified severity: Secondary | ICD-10-CM | POA: Diagnosis not present

## 2019-04-04 DIAGNOSIS — I872 Venous insufficiency (chronic) (peripheral): Secondary | ICD-10-CM | POA: Diagnosis not present

## 2019-04-04 DIAGNOSIS — I890 Lymphedema, not elsewhere classified: Secondary | ICD-10-CM | POA: Diagnosis not present

## 2019-04-04 DIAGNOSIS — R600 Localized edema: Secondary | ICD-10-CM | POA: Diagnosis not present

## 2019-04-04 DIAGNOSIS — I70299 Other atherosclerosis of native arteries of extremities, unspecified extremity: Secondary | ICD-10-CM | POA: Diagnosis not present

## 2019-04-04 DIAGNOSIS — L97819 Non-pressure chronic ulcer of other part of right lower leg with unspecified severity: Secondary | ICD-10-CM | POA: Diagnosis not present

## 2019-04-04 DIAGNOSIS — R634 Abnormal weight loss: Secondary | ICD-10-CM | POA: Diagnosis not present

## 2019-04-06 DIAGNOSIS — I872 Venous insufficiency (chronic) (peripheral): Secondary | ICD-10-CM | POA: Diagnosis not present

## 2019-04-06 DIAGNOSIS — L97819 Non-pressure chronic ulcer of other part of right lower leg with unspecified severity: Secondary | ICD-10-CM | POA: Diagnosis not present

## 2019-04-06 DIAGNOSIS — R600 Localized edema: Secondary | ICD-10-CM | POA: Diagnosis not present

## 2019-04-06 DIAGNOSIS — R634 Abnormal weight loss: Secondary | ICD-10-CM | POA: Diagnosis not present

## 2019-04-06 DIAGNOSIS — I70299 Other atherosclerosis of native arteries of extremities, unspecified extremity: Secondary | ICD-10-CM | POA: Diagnosis not present

## 2019-04-06 DIAGNOSIS — I890 Lymphedema, not elsewhere classified: Secondary | ICD-10-CM | POA: Diagnosis not present

## 2019-04-08 DIAGNOSIS — R600 Localized edema: Secondary | ICD-10-CM | POA: Diagnosis not present

## 2019-04-08 DIAGNOSIS — I872 Venous insufficiency (chronic) (peripheral): Secondary | ICD-10-CM | POA: Diagnosis not present

## 2019-04-08 DIAGNOSIS — I890 Lymphedema, not elsewhere classified: Secondary | ICD-10-CM | POA: Diagnosis not present

## 2019-04-08 DIAGNOSIS — L97819 Non-pressure chronic ulcer of other part of right lower leg with unspecified severity: Secondary | ICD-10-CM | POA: Diagnosis not present

## 2019-04-08 DIAGNOSIS — R634 Abnormal weight loss: Secondary | ICD-10-CM | POA: Diagnosis not present

## 2019-04-08 DIAGNOSIS — I70299 Other atherosclerosis of native arteries of extremities, unspecified extremity: Secondary | ICD-10-CM | POA: Diagnosis not present

## 2019-04-10 DIAGNOSIS — I890 Lymphedema, not elsewhere classified: Secondary | ICD-10-CM | POA: Diagnosis not present

## 2019-04-10 DIAGNOSIS — I70299 Other atherosclerosis of native arteries of extremities, unspecified extremity: Secondary | ICD-10-CM | POA: Diagnosis not present

## 2019-04-10 DIAGNOSIS — L97819 Non-pressure chronic ulcer of other part of right lower leg with unspecified severity: Secondary | ICD-10-CM | POA: Diagnosis not present

## 2019-04-10 DIAGNOSIS — I872 Venous insufficiency (chronic) (peripheral): Secondary | ICD-10-CM | POA: Diagnosis not present

## 2019-04-10 DIAGNOSIS — R600 Localized edema: Secondary | ICD-10-CM | POA: Diagnosis not present

## 2019-04-10 DIAGNOSIS — R634 Abnormal weight loss: Secondary | ICD-10-CM | POA: Diagnosis not present

## 2019-04-11 DIAGNOSIS — R600 Localized edema: Secondary | ICD-10-CM | POA: Diagnosis not present

## 2019-04-11 DIAGNOSIS — I70299 Other atherosclerosis of native arteries of extremities, unspecified extremity: Secondary | ICD-10-CM | POA: Diagnosis not present

## 2019-04-11 DIAGNOSIS — I872 Venous insufficiency (chronic) (peripheral): Secondary | ICD-10-CM | POA: Diagnosis not present

## 2019-04-11 DIAGNOSIS — L97819 Non-pressure chronic ulcer of other part of right lower leg with unspecified severity: Secondary | ICD-10-CM | POA: Diagnosis not present

## 2019-04-11 DIAGNOSIS — R634 Abnormal weight loss: Secondary | ICD-10-CM | POA: Diagnosis not present

## 2019-04-11 DIAGNOSIS — I890 Lymphedema, not elsewhere classified: Secondary | ICD-10-CM | POA: Diagnosis not present

## 2019-04-12 DIAGNOSIS — I872 Venous insufficiency (chronic) (peripheral): Secondary | ICD-10-CM | POA: Diagnosis not present

## 2019-04-12 DIAGNOSIS — L97819 Non-pressure chronic ulcer of other part of right lower leg with unspecified severity: Secondary | ICD-10-CM | POA: Diagnosis not present

## 2019-04-12 DIAGNOSIS — I70299 Other atherosclerosis of native arteries of extremities, unspecified extremity: Secondary | ICD-10-CM | POA: Diagnosis not present

## 2019-04-12 DIAGNOSIS — R600 Localized edema: Secondary | ICD-10-CM | POA: Diagnosis not present

## 2019-04-12 DIAGNOSIS — R634 Abnormal weight loss: Secondary | ICD-10-CM | POA: Diagnosis not present

## 2019-04-12 DIAGNOSIS — I890 Lymphedema, not elsewhere classified: Secondary | ICD-10-CM | POA: Diagnosis not present

## 2019-04-14 DIAGNOSIS — L97819 Non-pressure chronic ulcer of other part of right lower leg with unspecified severity: Secondary | ICD-10-CM | POA: Diagnosis not present

## 2019-04-14 DIAGNOSIS — R634 Abnormal weight loss: Secondary | ICD-10-CM | POA: Diagnosis not present

## 2019-04-14 DIAGNOSIS — I70299 Other atherosclerosis of native arteries of extremities, unspecified extremity: Secondary | ICD-10-CM | POA: Diagnosis not present

## 2019-04-14 DIAGNOSIS — R600 Localized edema: Secondary | ICD-10-CM | POA: Diagnosis not present

## 2019-04-14 DIAGNOSIS — I890 Lymphedema, not elsewhere classified: Secondary | ICD-10-CM | POA: Diagnosis not present

## 2019-04-14 DIAGNOSIS — I872 Venous insufficiency (chronic) (peripheral): Secondary | ICD-10-CM | POA: Diagnosis not present

## 2019-04-15 DIAGNOSIS — L97819 Non-pressure chronic ulcer of other part of right lower leg with unspecified severity: Secondary | ICD-10-CM | POA: Diagnosis not present

## 2019-04-15 DIAGNOSIS — I890 Lymphedema, not elsewhere classified: Secondary | ICD-10-CM | POA: Diagnosis not present

## 2019-04-15 DIAGNOSIS — R600 Localized edema: Secondary | ICD-10-CM | POA: Diagnosis not present

## 2019-04-15 DIAGNOSIS — I70299 Other atherosclerosis of native arteries of extremities, unspecified extremity: Secondary | ICD-10-CM | POA: Diagnosis not present

## 2019-04-15 DIAGNOSIS — R634 Abnormal weight loss: Secondary | ICD-10-CM | POA: Diagnosis not present

## 2019-04-15 DIAGNOSIS — I872 Venous insufficiency (chronic) (peripheral): Secondary | ICD-10-CM | POA: Diagnosis not present

## 2019-04-16 DIAGNOSIS — I890 Lymphedema, not elsewhere classified: Secondary | ICD-10-CM | POA: Diagnosis not present

## 2019-04-16 DIAGNOSIS — I70299 Other atherosclerosis of native arteries of extremities, unspecified extremity: Secondary | ICD-10-CM | POA: Diagnosis not present

## 2019-04-16 DIAGNOSIS — R634 Abnormal weight loss: Secondary | ICD-10-CM | POA: Diagnosis not present

## 2019-04-16 DIAGNOSIS — R600 Localized edema: Secondary | ICD-10-CM | POA: Diagnosis not present

## 2019-04-16 DIAGNOSIS — I872 Venous insufficiency (chronic) (peripheral): Secondary | ICD-10-CM | POA: Diagnosis not present

## 2019-04-16 DIAGNOSIS — L97819 Non-pressure chronic ulcer of other part of right lower leg with unspecified severity: Secondary | ICD-10-CM | POA: Diagnosis not present

## 2019-04-17 DIAGNOSIS — I70299 Other atherosclerosis of native arteries of extremities, unspecified extremity: Secondary | ICD-10-CM | POA: Diagnosis not present

## 2019-04-17 DIAGNOSIS — R600 Localized edema: Secondary | ICD-10-CM | POA: Diagnosis not present

## 2019-04-17 DIAGNOSIS — R634 Abnormal weight loss: Secondary | ICD-10-CM | POA: Diagnosis not present

## 2019-04-17 DIAGNOSIS — I890 Lymphedema, not elsewhere classified: Secondary | ICD-10-CM | POA: Diagnosis not present

## 2019-04-17 DIAGNOSIS — L97819 Non-pressure chronic ulcer of other part of right lower leg with unspecified severity: Secondary | ICD-10-CM | POA: Diagnosis not present

## 2019-04-17 DIAGNOSIS — I872 Venous insufficiency (chronic) (peripheral): Secondary | ICD-10-CM | POA: Diagnosis not present

## 2019-04-18 DIAGNOSIS — L97819 Non-pressure chronic ulcer of other part of right lower leg with unspecified severity: Secondary | ICD-10-CM | POA: Diagnosis not present

## 2019-04-18 DIAGNOSIS — R634 Abnormal weight loss: Secondary | ICD-10-CM | POA: Diagnosis not present

## 2019-04-18 DIAGNOSIS — I70299 Other atherosclerosis of native arteries of extremities, unspecified extremity: Secondary | ICD-10-CM | POA: Diagnosis not present

## 2019-04-18 DIAGNOSIS — R600 Localized edema: Secondary | ICD-10-CM | POA: Diagnosis not present

## 2019-04-18 DIAGNOSIS — I872 Venous insufficiency (chronic) (peripheral): Secondary | ICD-10-CM | POA: Diagnosis not present

## 2019-04-18 DIAGNOSIS — I890 Lymphedema, not elsewhere classified: Secondary | ICD-10-CM | POA: Diagnosis not present

## 2019-04-19 DIAGNOSIS — L97819 Non-pressure chronic ulcer of other part of right lower leg with unspecified severity: Secondary | ICD-10-CM | POA: Diagnosis not present

## 2019-04-19 DIAGNOSIS — I890 Lymphedema, not elsewhere classified: Secondary | ICD-10-CM | POA: Diagnosis not present

## 2019-04-19 DIAGNOSIS — R600 Localized edema: Secondary | ICD-10-CM | POA: Diagnosis not present

## 2019-04-19 DIAGNOSIS — I872 Venous insufficiency (chronic) (peripheral): Secondary | ICD-10-CM | POA: Diagnosis not present

## 2019-04-19 DIAGNOSIS — R634 Abnormal weight loss: Secondary | ICD-10-CM | POA: Diagnosis not present

## 2019-04-19 DIAGNOSIS — I70299 Other atherosclerosis of native arteries of extremities, unspecified extremity: Secondary | ICD-10-CM | POA: Diagnosis not present

## 2019-04-21 DIAGNOSIS — I70299 Other atherosclerosis of native arteries of extremities, unspecified extremity: Secondary | ICD-10-CM | POA: Diagnosis not present

## 2019-04-21 DIAGNOSIS — L97819 Non-pressure chronic ulcer of other part of right lower leg with unspecified severity: Secondary | ICD-10-CM | POA: Diagnosis not present

## 2019-04-21 DIAGNOSIS — R600 Localized edema: Secondary | ICD-10-CM | POA: Diagnosis not present

## 2019-04-21 DIAGNOSIS — R634 Abnormal weight loss: Secondary | ICD-10-CM | POA: Diagnosis not present

## 2019-04-21 DIAGNOSIS — I872 Venous insufficiency (chronic) (peripheral): Secondary | ICD-10-CM | POA: Diagnosis not present

## 2019-04-21 DIAGNOSIS — I890 Lymphedema, not elsewhere classified: Secondary | ICD-10-CM | POA: Diagnosis not present

## 2019-04-22 DIAGNOSIS — L97819 Non-pressure chronic ulcer of other part of right lower leg with unspecified severity: Secondary | ICD-10-CM | POA: Diagnosis not present

## 2019-04-22 DIAGNOSIS — R634 Abnormal weight loss: Secondary | ICD-10-CM | POA: Diagnosis not present

## 2019-04-22 DIAGNOSIS — I872 Venous insufficiency (chronic) (peripheral): Secondary | ICD-10-CM | POA: Diagnosis not present

## 2019-04-22 DIAGNOSIS — I70299 Other atherosclerosis of native arteries of extremities, unspecified extremity: Secondary | ICD-10-CM | POA: Diagnosis not present

## 2019-04-22 DIAGNOSIS — R600 Localized edema: Secondary | ICD-10-CM | POA: Diagnosis not present

## 2019-04-22 DIAGNOSIS — I890 Lymphedema, not elsewhere classified: Secondary | ICD-10-CM | POA: Diagnosis not present

## 2019-04-24 DIAGNOSIS — I890 Lymphedema, not elsewhere classified: Secondary | ICD-10-CM | POA: Diagnosis not present

## 2019-04-24 DIAGNOSIS — R600 Localized edema: Secondary | ICD-10-CM | POA: Diagnosis not present

## 2019-04-24 DIAGNOSIS — R634 Abnormal weight loss: Secondary | ICD-10-CM | POA: Diagnosis not present

## 2019-04-24 DIAGNOSIS — I70299 Other atherosclerosis of native arteries of extremities, unspecified extremity: Secondary | ICD-10-CM | POA: Diagnosis not present

## 2019-04-24 DIAGNOSIS — I872 Venous insufficiency (chronic) (peripheral): Secondary | ICD-10-CM | POA: Diagnosis not present

## 2019-04-24 DIAGNOSIS — L97819 Non-pressure chronic ulcer of other part of right lower leg with unspecified severity: Secondary | ICD-10-CM | POA: Diagnosis not present

## 2019-04-25 DIAGNOSIS — I872 Venous insufficiency (chronic) (peripheral): Secondary | ICD-10-CM | POA: Diagnosis not present

## 2019-04-25 DIAGNOSIS — R600 Localized edema: Secondary | ICD-10-CM | POA: Diagnosis not present

## 2019-04-25 DIAGNOSIS — R634 Abnormal weight loss: Secondary | ICD-10-CM | POA: Diagnosis not present

## 2019-04-25 DIAGNOSIS — I10 Essential (primary) hypertension: Secondary | ICD-10-CM | POA: Diagnosis not present

## 2019-04-25 DIAGNOSIS — R5381 Other malaise: Secondary | ICD-10-CM | POA: Diagnosis not present

## 2019-04-25 DIAGNOSIS — I70299 Other atherosclerosis of native arteries of extremities, unspecified extremity: Secondary | ICD-10-CM | POA: Diagnosis not present

## 2019-04-25 DIAGNOSIS — L97819 Non-pressure chronic ulcer of other part of right lower leg with unspecified severity: Secondary | ICD-10-CM | POA: Diagnosis not present

## 2019-04-25 DIAGNOSIS — I890 Lymphedema, not elsewhere classified: Secondary | ICD-10-CM | POA: Diagnosis not present

## 2019-04-27 DIAGNOSIS — R600 Localized edema: Secondary | ICD-10-CM | POA: Diagnosis not present

## 2019-04-27 DIAGNOSIS — R634 Abnormal weight loss: Secondary | ICD-10-CM | POA: Diagnosis not present

## 2019-04-27 DIAGNOSIS — I872 Venous insufficiency (chronic) (peripheral): Secondary | ICD-10-CM | POA: Diagnosis not present

## 2019-04-27 DIAGNOSIS — I890 Lymphedema, not elsewhere classified: Secondary | ICD-10-CM | POA: Diagnosis not present

## 2019-04-27 DIAGNOSIS — I70299 Other atherosclerosis of native arteries of extremities, unspecified extremity: Secondary | ICD-10-CM | POA: Diagnosis not present

## 2019-04-27 DIAGNOSIS — L97819 Non-pressure chronic ulcer of other part of right lower leg with unspecified severity: Secondary | ICD-10-CM | POA: Diagnosis not present

## 2019-04-28 DIAGNOSIS — R634 Abnormal weight loss: Secondary | ICD-10-CM | POA: Diagnosis not present

## 2019-04-28 DIAGNOSIS — R600 Localized edema: Secondary | ICD-10-CM | POA: Diagnosis not present

## 2019-04-28 DIAGNOSIS — I872 Venous insufficiency (chronic) (peripheral): Secondary | ICD-10-CM | POA: Diagnosis not present

## 2019-04-28 DIAGNOSIS — L97819 Non-pressure chronic ulcer of other part of right lower leg with unspecified severity: Secondary | ICD-10-CM | POA: Diagnosis not present

## 2019-04-28 DIAGNOSIS — I890 Lymphedema, not elsewhere classified: Secondary | ICD-10-CM | POA: Diagnosis not present

## 2019-04-28 DIAGNOSIS — I70299 Other atherosclerosis of native arteries of extremities, unspecified extremity: Secondary | ICD-10-CM | POA: Diagnosis not present

## 2019-04-29 DIAGNOSIS — I872 Venous insufficiency (chronic) (peripheral): Secondary | ICD-10-CM | POA: Diagnosis not present

## 2019-04-29 DIAGNOSIS — I70299 Other atherosclerosis of native arteries of extremities, unspecified extremity: Secondary | ICD-10-CM | POA: Diagnosis not present

## 2019-04-29 DIAGNOSIS — L97819 Non-pressure chronic ulcer of other part of right lower leg with unspecified severity: Secondary | ICD-10-CM | POA: Diagnosis not present

## 2019-04-29 DIAGNOSIS — R600 Localized edema: Secondary | ICD-10-CM | POA: Diagnosis not present

## 2019-04-29 DIAGNOSIS — R634 Abnormal weight loss: Secondary | ICD-10-CM | POA: Diagnosis not present

## 2019-04-29 DIAGNOSIS — I890 Lymphedema, not elsewhere classified: Secondary | ICD-10-CM | POA: Diagnosis not present

## 2019-04-30 DIAGNOSIS — I890 Lymphedema, not elsewhere classified: Secondary | ICD-10-CM | POA: Diagnosis not present

## 2019-04-30 DIAGNOSIS — M2042 Other hammer toe(s) (acquired), left foot: Secondary | ICD-10-CM | POA: Diagnosis not present

## 2019-04-30 DIAGNOSIS — L97819 Non-pressure chronic ulcer of other part of right lower leg with unspecified severity: Secondary | ICD-10-CM | POA: Diagnosis not present

## 2019-04-30 DIAGNOSIS — I872 Venous insufficiency (chronic) (peripheral): Secondary | ICD-10-CM | POA: Diagnosis not present

## 2019-04-30 DIAGNOSIS — B353 Tinea pedis: Secondary | ICD-10-CM | POA: Diagnosis not present

## 2019-04-30 DIAGNOSIS — R600 Localized edema: Secondary | ICD-10-CM | POA: Diagnosis not present

## 2019-04-30 DIAGNOSIS — B351 Tinea unguium: Secondary | ICD-10-CM | POA: Diagnosis not present

## 2019-04-30 DIAGNOSIS — L600 Ingrowing nail: Secondary | ICD-10-CM | POA: Diagnosis not present

## 2019-04-30 DIAGNOSIS — R634 Abnormal weight loss: Secondary | ICD-10-CM | POA: Diagnosis not present

## 2019-04-30 DIAGNOSIS — M79674 Pain in right toe(s): Secondary | ICD-10-CM | POA: Diagnosis not present

## 2019-04-30 DIAGNOSIS — I739 Peripheral vascular disease, unspecified: Secondary | ICD-10-CM | POA: Diagnosis not present

## 2019-04-30 DIAGNOSIS — I70299 Other atherosclerosis of native arteries of extremities, unspecified extremity: Secondary | ICD-10-CM | POA: Diagnosis not present

## 2019-05-01 DIAGNOSIS — I890 Lymphedema, not elsewhere classified: Secondary | ICD-10-CM | POA: Diagnosis not present

## 2019-05-01 DIAGNOSIS — I70299 Other atherosclerosis of native arteries of extremities, unspecified extremity: Secondary | ICD-10-CM | POA: Diagnosis not present

## 2019-05-01 DIAGNOSIS — L97819 Non-pressure chronic ulcer of other part of right lower leg with unspecified severity: Secondary | ICD-10-CM | POA: Diagnosis not present

## 2019-05-01 DIAGNOSIS — R600 Localized edema: Secondary | ICD-10-CM | POA: Diagnosis not present

## 2019-05-01 DIAGNOSIS — R634 Abnormal weight loss: Secondary | ICD-10-CM | POA: Diagnosis not present

## 2019-05-01 DIAGNOSIS — I872 Venous insufficiency (chronic) (peripheral): Secondary | ICD-10-CM | POA: Diagnosis not present

## 2019-05-02 DIAGNOSIS — L97819 Non-pressure chronic ulcer of other part of right lower leg with unspecified severity: Secondary | ICD-10-CM | POA: Diagnosis not present

## 2019-05-02 DIAGNOSIS — R634 Abnormal weight loss: Secondary | ICD-10-CM | POA: Diagnosis not present

## 2019-05-02 DIAGNOSIS — R600 Localized edema: Secondary | ICD-10-CM | POA: Diagnosis not present

## 2019-05-02 DIAGNOSIS — I70299 Other atherosclerosis of native arteries of extremities, unspecified extremity: Secondary | ICD-10-CM | POA: Diagnosis not present

## 2019-05-02 DIAGNOSIS — I872 Venous insufficiency (chronic) (peripheral): Secondary | ICD-10-CM | POA: Diagnosis not present

## 2019-05-02 DIAGNOSIS — I890 Lymphedema, not elsewhere classified: Secondary | ICD-10-CM | POA: Diagnosis not present

## 2019-05-04 DIAGNOSIS — I70299 Other atherosclerosis of native arteries of extremities, unspecified extremity: Secondary | ICD-10-CM | POA: Diagnosis not present

## 2019-05-04 DIAGNOSIS — R600 Localized edema: Secondary | ICD-10-CM | POA: Diagnosis not present

## 2019-05-04 DIAGNOSIS — I890 Lymphedema, not elsewhere classified: Secondary | ICD-10-CM | POA: Diagnosis not present

## 2019-05-04 DIAGNOSIS — R634 Abnormal weight loss: Secondary | ICD-10-CM | POA: Diagnosis not present

## 2019-05-04 DIAGNOSIS — L97819 Non-pressure chronic ulcer of other part of right lower leg with unspecified severity: Secondary | ICD-10-CM | POA: Diagnosis not present

## 2019-05-04 DIAGNOSIS — I872 Venous insufficiency (chronic) (peripheral): Secondary | ICD-10-CM | POA: Diagnosis not present

## 2019-05-06 DIAGNOSIS — R634 Abnormal weight loss: Secondary | ICD-10-CM | POA: Diagnosis not present

## 2019-05-06 DIAGNOSIS — I890 Lymphedema, not elsewhere classified: Secondary | ICD-10-CM | POA: Diagnosis not present

## 2019-05-06 DIAGNOSIS — I872 Venous insufficiency (chronic) (peripheral): Secondary | ICD-10-CM | POA: Diagnosis not present

## 2019-05-06 DIAGNOSIS — L97819 Non-pressure chronic ulcer of other part of right lower leg with unspecified severity: Secondary | ICD-10-CM | POA: Diagnosis not present

## 2019-05-06 DIAGNOSIS — I70299 Other atherosclerosis of native arteries of extremities, unspecified extremity: Secondary | ICD-10-CM | POA: Diagnosis not present

## 2019-05-06 DIAGNOSIS — R600 Localized edema: Secondary | ICD-10-CM | POA: Diagnosis not present

## 2019-05-08 DIAGNOSIS — R634 Abnormal weight loss: Secondary | ICD-10-CM | POA: Diagnosis not present

## 2019-05-08 DIAGNOSIS — I890 Lymphedema, not elsewhere classified: Secondary | ICD-10-CM | POA: Diagnosis not present

## 2019-05-08 DIAGNOSIS — I70299 Other atherosclerosis of native arteries of extremities, unspecified extremity: Secondary | ICD-10-CM | POA: Diagnosis not present

## 2019-05-08 DIAGNOSIS — I872 Venous insufficiency (chronic) (peripheral): Secondary | ICD-10-CM | POA: Diagnosis not present

## 2019-05-08 DIAGNOSIS — L97819 Non-pressure chronic ulcer of other part of right lower leg with unspecified severity: Secondary | ICD-10-CM | POA: Diagnosis not present

## 2019-05-08 DIAGNOSIS — R600 Localized edema: Secondary | ICD-10-CM | POA: Diagnosis not present

## 2019-05-09 DIAGNOSIS — I872 Venous insufficiency (chronic) (peripheral): Secondary | ICD-10-CM | POA: Diagnosis not present

## 2019-05-09 DIAGNOSIS — R634 Abnormal weight loss: Secondary | ICD-10-CM | POA: Diagnosis not present

## 2019-05-09 DIAGNOSIS — R600 Localized edema: Secondary | ICD-10-CM | POA: Diagnosis not present

## 2019-05-09 DIAGNOSIS — I70299 Other atherosclerosis of native arteries of extremities, unspecified extremity: Secondary | ICD-10-CM | POA: Diagnosis not present

## 2019-05-09 DIAGNOSIS — I890 Lymphedema, not elsewhere classified: Secondary | ICD-10-CM | POA: Diagnosis not present

## 2019-05-09 DIAGNOSIS — L97819 Non-pressure chronic ulcer of other part of right lower leg with unspecified severity: Secondary | ICD-10-CM | POA: Diagnosis not present

## 2019-05-12 DIAGNOSIS — I890 Lymphedema, not elsewhere classified: Secondary | ICD-10-CM | POA: Diagnosis not present

## 2019-05-12 DIAGNOSIS — L97819 Non-pressure chronic ulcer of other part of right lower leg with unspecified severity: Secondary | ICD-10-CM | POA: Diagnosis not present

## 2019-05-12 DIAGNOSIS — I872 Venous insufficiency (chronic) (peripheral): Secondary | ICD-10-CM | POA: Diagnosis not present

## 2019-05-12 DIAGNOSIS — R600 Localized edema: Secondary | ICD-10-CM | POA: Diagnosis not present

## 2019-05-12 DIAGNOSIS — R634 Abnormal weight loss: Secondary | ICD-10-CM | POA: Diagnosis not present

## 2019-05-12 DIAGNOSIS — I70299 Other atherosclerosis of native arteries of extremities, unspecified extremity: Secondary | ICD-10-CM | POA: Diagnosis not present

## 2019-05-13 DIAGNOSIS — L97819 Non-pressure chronic ulcer of other part of right lower leg with unspecified severity: Secondary | ICD-10-CM | POA: Diagnosis not present

## 2019-05-13 DIAGNOSIS — I872 Venous insufficiency (chronic) (peripheral): Secondary | ICD-10-CM | POA: Diagnosis not present

## 2019-05-13 DIAGNOSIS — R634 Abnormal weight loss: Secondary | ICD-10-CM | POA: Diagnosis not present

## 2019-05-13 DIAGNOSIS — R600 Localized edema: Secondary | ICD-10-CM | POA: Diagnosis not present

## 2019-05-13 DIAGNOSIS — I70299 Other atherosclerosis of native arteries of extremities, unspecified extremity: Secondary | ICD-10-CM | POA: Diagnosis not present

## 2019-05-13 DIAGNOSIS — I890 Lymphedema, not elsewhere classified: Secondary | ICD-10-CM | POA: Diagnosis not present

## 2019-05-15 DIAGNOSIS — I70299 Other atherosclerosis of native arteries of extremities, unspecified extremity: Secondary | ICD-10-CM | POA: Diagnosis not present

## 2019-05-15 DIAGNOSIS — R600 Localized edema: Secondary | ICD-10-CM | POA: Diagnosis not present

## 2019-05-15 DIAGNOSIS — L97819 Non-pressure chronic ulcer of other part of right lower leg with unspecified severity: Secondary | ICD-10-CM | POA: Diagnosis not present

## 2019-05-15 DIAGNOSIS — R634 Abnormal weight loss: Secondary | ICD-10-CM | POA: Diagnosis not present

## 2019-05-15 DIAGNOSIS — I890 Lymphedema, not elsewhere classified: Secondary | ICD-10-CM | POA: Diagnosis not present

## 2019-05-15 DIAGNOSIS — I872 Venous insufficiency (chronic) (peripheral): Secondary | ICD-10-CM | POA: Diagnosis not present

## 2019-05-16 DIAGNOSIS — I872 Venous insufficiency (chronic) (peripheral): Secondary | ICD-10-CM | POA: Diagnosis not present

## 2019-05-16 DIAGNOSIS — I70299 Other atherosclerosis of native arteries of extremities, unspecified extremity: Secondary | ICD-10-CM | POA: Diagnosis not present

## 2019-05-16 DIAGNOSIS — R600 Localized edema: Secondary | ICD-10-CM | POA: Diagnosis not present

## 2019-05-16 DIAGNOSIS — L97819 Non-pressure chronic ulcer of other part of right lower leg with unspecified severity: Secondary | ICD-10-CM | POA: Diagnosis not present

## 2019-05-16 DIAGNOSIS — R634 Abnormal weight loss: Secondary | ICD-10-CM | POA: Diagnosis not present

## 2019-05-16 DIAGNOSIS — I890 Lymphedema, not elsewhere classified: Secondary | ICD-10-CM | POA: Diagnosis not present

## 2019-05-17 DIAGNOSIS — I890 Lymphedema, not elsewhere classified: Secondary | ICD-10-CM | POA: Diagnosis not present

## 2019-05-17 DIAGNOSIS — L97819 Non-pressure chronic ulcer of other part of right lower leg with unspecified severity: Secondary | ICD-10-CM | POA: Diagnosis not present

## 2019-05-17 DIAGNOSIS — I70299 Other atherosclerosis of native arteries of extremities, unspecified extremity: Secondary | ICD-10-CM | POA: Diagnosis not present

## 2019-05-17 DIAGNOSIS — I872 Venous insufficiency (chronic) (peripheral): Secondary | ICD-10-CM | POA: Diagnosis not present

## 2019-05-17 DIAGNOSIS — R600 Localized edema: Secondary | ICD-10-CM | POA: Diagnosis not present

## 2019-05-17 DIAGNOSIS — R634 Abnormal weight loss: Secondary | ICD-10-CM | POA: Diagnosis not present

## 2019-05-19 DIAGNOSIS — R634 Abnormal weight loss: Secondary | ICD-10-CM | POA: Diagnosis not present

## 2019-05-19 DIAGNOSIS — I890 Lymphedema, not elsewhere classified: Secondary | ICD-10-CM | POA: Diagnosis not present

## 2019-05-19 DIAGNOSIS — L97819 Non-pressure chronic ulcer of other part of right lower leg with unspecified severity: Secondary | ICD-10-CM | POA: Diagnosis not present

## 2019-05-19 DIAGNOSIS — R600 Localized edema: Secondary | ICD-10-CM | POA: Diagnosis not present

## 2019-05-19 DIAGNOSIS — I70299 Other atherosclerosis of native arteries of extremities, unspecified extremity: Secondary | ICD-10-CM | POA: Diagnosis not present

## 2019-05-19 DIAGNOSIS — I872 Venous insufficiency (chronic) (peripheral): Secondary | ICD-10-CM | POA: Diagnosis not present

## 2019-05-22 DIAGNOSIS — I872 Venous insufficiency (chronic) (peripheral): Secondary | ICD-10-CM | POA: Diagnosis not present

## 2019-05-22 DIAGNOSIS — L97819 Non-pressure chronic ulcer of other part of right lower leg with unspecified severity: Secondary | ICD-10-CM | POA: Diagnosis not present

## 2019-05-22 DIAGNOSIS — R634 Abnormal weight loss: Secondary | ICD-10-CM | POA: Diagnosis not present

## 2019-05-22 DIAGNOSIS — I890 Lymphedema, not elsewhere classified: Secondary | ICD-10-CM | POA: Diagnosis not present

## 2019-05-22 DIAGNOSIS — R600 Localized edema: Secondary | ICD-10-CM | POA: Diagnosis not present

## 2019-05-22 DIAGNOSIS — I70299 Other atherosclerosis of native arteries of extremities, unspecified extremity: Secondary | ICD-10-CM | POA: Diagnosis not present

## 2019-05-23 DIAGNOSIS — I872 Venous insufficiency (chronic) (peripheral): Secondary | ICD-10-CM | POA: Diagnosis not present

## 2019-05-23 DIAGNOSIS — I70299 Other atherosclerosis of native arteries of extremities, unspecified extremity: Secondary | ICD-10-CM | POA: Diagnosis not present

## 2019-05-23 DIAGNOSIS — R600 Localized edema: Secondary | ICD-10-CM | POA: Diagnosis not present

## 2019-05-23 DIAGNOSIS — I890 Lymphedema, not elsewhere classified: Secondary | ICD-10-CM | POA: Diagnosis not present

## 2019-05-23 DIAGNOSIS — L97819 Non-pressure chronic ulcer of other part of right lower leg with unspecified severity: Secondary | ICD-10-CM | POA: Diagnosis not present

## 2019-05-23 DIAGNOSIS — R634 Abnormal weight loss: Secondary | ICD-10-CM | POA: Diagnosis not present

## 2019-05-24 DIAGNOSIS — R600 Localized edema: Secondary | ICD-10-CM | POA: Diagnosis not present

## 2019-05-24 DIAGNOSIS — I70299 Other atherosclerosis of native arteries of extremities, unspecified extremity: Secondary | ICD-10-CM | POA: Diagnosis not present

## 2019-05-24 DIAGNOSIS — I872 Venous insufficiency (chronic) (peripheral): Secondary | ICD-10-CM | POA: Diagnosis not present

## 2019-05-24 DIAGNOSIS — L97819 Non-pressure chronic ulcer of other part of right lower leg with unspecified severity: Secondary | ICD-10-CM | POA: Diagnosis not present

## 2019-05-24 DIAGNOSIS — R634 Abnormal weight loss: Secondary | ICD-10-CM | POA: Diagnosis not present

## 2019-05-24 DIAGNOSIS — I890 Lymphedema, not elsewhere classified: Secondary | ICD-10-CM | POA: Diagnosis not present

## 2019-06-25 DIAGNOSIS — R5381 Other malaise: Secondary | ICD-10-CM | POA: Diagnosis not present

## 2019-06-25 DIAGNOSIS — I890 Lymphedema, not elsewhere classified: Secondary | ICD-10-CM | POA: Diagnosis not present

## 2019-06-25 DIAGNOSIS — R634 Abnormal weight loss: Secondary | ICD-10-CM | POA: Diagnosis not present

## 2019-06-25 DIAGNOSIS — I872 Venous insufficiency (chronic) (peripheral): Secondary | ICD-10-CM | POA: Diagnosis not present

## 2019-06-25 DIAGNOSIS — R600 Localized edema: Secondary | ICD-10-CM | POA: Diagnosis not present

## 2019-06-25 DIAGNOSIS — I70299 Other atherosclerosis of native arteries of extremities, unspecified extremity: Secondary | ICD-10-CM | POA: Diagnosis not present

## 2019-06-25 DIAGNOSIS — L97819 Non-pressure chronic ulcer of other part of right lower leg with unspecified severity: Secondary | ICD-10-CM | POA: Diagnosis not present

## 2019-06-25 DIAGNOSIS — I10 Essential (primary) hypertension: Secondary | ICD-10-CM | POA: Diagnosis not present

## 2019-06-26 DIAGNOSIS — R634 Abnormal weight loss: Secondary | ICD-10-CM | POA: Diagnosis not present

## 2019-06-26 DIAGNOSIS — I890 Lymphedema, not elsewhere classified: Secondary | ICD-10-CM | POA: Diagnosis not present

## 2019-06-26 DIAGNOSIS — R600 Localized edema: Secondary | ICD-10-CM | POA: Diagnosis not present

## 2019-06-26 DIAGNOSIS — I872 Venous insufficiency (chronic) (peripheral): Secondary | ICD-10-CM | POA: Diagnosis not present

## 2019-06-26 DIAGNOSIS — L97819 Non-pressure chronic ulcer of other part of right lower leg with unspecified severity: Secondary | ICD-10-CM | POA: Diagnosis not present

## 2019-06-26 DIAGNOSIS — I70299 Other atherosclerosis of native arteries of extremities, unspecified extremity: Secondary | ICD-10-CM | POA: Diagnosis not present

## 2019-06-27 DIAGNOSIS — R634 Abnormal weight loss: Secondary | ICD-10-CM | POA: Diagnosis not present

## 2019-06-27 DIAGNOSIS — R600 Localized edema: Secondary | ICD-10-CM | POA: Diagnosis not present

## 2019-06-27 DIAGNOSIS — I872 Venous insufficiency (chronic) (peripheral): Secondary | ICD-10-CM | POA: Diagnosis not present

## 2019-06-27 DIAGNOSIS — I70299 Other atherosclerosis of native arteries of extremities, unspecified extremity: Secondary | ICD-10-CM | POA: Diagnosis not present

## 2019-06-27 DIAGNOSIS — L97819 Non-pressure chronic ulcer of other part of right lower leg with unspecified severity: Secondary | ICD-10-CM | POA: Diagnosis not present

## 2019-06-27 DIAGNOSIS — I890 Lymphedema, not elsewhere classified: Secondary | ICD-10-CM | POA: Diagnosis not present

## 2019-06-28 DIAGNOSIS — R634 Abnormal weight loss: Secondary | ICD-10-CM | POA: Diagnosis not present

## 2019-06-28 DIAGNOSIS — R600 Localized edema: Secondary | ICD-10-CM | POA: Diagnosis not present

## 2019-06-28 DIAGNOSIS — I70299 Other atherosclerosis of native arteries of extremities, unspecified extremity: Secondary | ICD-10-CM | POA: Diagnosis not present

## 2019-06-28 DIAGNOSIS — I872 Venous insufficiency (chronic) (peripheral): Secondary | ICD-10-CM | POA: Diagnosis not present

## 2019-06-28 DIAGNOSIS — L97819 Non-pressure chronic ulcer of other part of right lower leg with unspecified severity: Secondary | ICD-10-CM | POA: Diagnosis not present

## 2019-06-28 DIAGNOSIS — I890 Lymphedema, not elsewhere classified: Secondary | ICD-10-CM | POA: Diagnosis not present

## 2019-06-29 DIAGNOSIS — I890 Lymphedema, not elsewhere classified: Secondary | ICD-10-CM | POA: Diagnosis not present

## 2019-06-29 DIAGNOSIS — I872 Venous insufficiency (chronic) (peripheral): Secondary | ICD-10-CM | POA: Diagnosis not present

## 2019-06-29 DIAGNOSIS — R634 Abnormal weight loss: Secondary | ICD-10-CM | POA: Diagnosis not present

## 2019-06-29 DIAGNOSIS — I70299 Other atherosclerosis of native arteries of extremities, unspecified extremity: Secondary | ICD-10-CM | POA: Diagnosis not present

## 2019-06-29 DIAGNOSIS — L97819 Non-pressure chronic ulcer of other part of right lower leg with unspecified severity: Secondary | ICD-10-CM | POA: Diagnosis not present

## 2019-06-29 DIAGNOSIS — R600 Localized edema: Secondary | ICD-10-CM | POA: Diagnosis not present

## 2019-06-30 DIAGNOSIS — R600 Localized edema: Secondary | ICD-10-CM | POA: Diagnosis not present

## 2019-06-30 DIAGNOSIS — I890 Lymphedema, not elsewhere classified: Secondary | ICD-10-CM | POA: Diagnosis not present

## 2019-06-30 DIAGNOSIS — L97819 Non-pressure chronic ulcer of other part of right lower leg with unspecified severity: Secondary | ICD-10-CM | POA: Diagnosis not present

## 2019-06-30 DIAGNOSIS — I872 Venous insufficiency (chronic) (peripheral): Secondary | ICD-10-CM | POA: Diagnosis not present

## 2019-06-30 DIAGNOSIS — I70299 Other atherosclerosis of native arteries of extremities, unspecified extremity: Secondary | ICD-10-CM | POA: Diagnosis not present

## 2019-06-30 DIAGNOSIS — R634 Abnormal weight loss: Secondary | ICD-10-CM | POA: Diagnosis not present

## 2019-07-01 DIAGNOSIS — L97819 Non-pressure chronic ulcer of other part of right lower leg with unspecified severity: Secondary | ICD-10-CM | POA: Diagnosis not present

## 2019-07-01 DIAGNOSIS — I872 Venous insufficiency (chronic) (peripheral): Secondary | ICD-10-CM | POA: Diagnosis not present

## 2019-07-01 DIAGNOSIS — I890 Lymphedema, not elsewhere classified: Secondary | ICD-10-CM | POA: Diagnosis not present

## 2019-07-01 DIAGNOSIS — R600 Localized edema: Secondary | ICD-10-CM | POA: Diagnosis not present

## 2019-07-01 DIAGNOSIS — I70299 Other atherosclerosis of native arteries of extremities, unspecified extremity: Secondary | ICD-10-CM | POA: Diagnosis not present

## 2019-07-01 DIAGNOSIS — R634 Abnormal weight loss: Secondary | ICD-10-CM | POA: Diagnosis not present

## 2019-07-03 DIAGNOSIS — I70299 Other atherosclerosis of native arteries of extremities, unspecified extremity: Secondary | ICD-10-CM | POA: Diagnosis not present

## 2019-07-03 DIAGNOSIS — I890 Lymphedema, not elsewhere classified: Secondary | ICD-10-CM | POA: Diagnosis not present

## 2019-07-03 DIAGNOSIS — L97819 Non-pressure chronic ulcer of other part of right lower leg with unspecified severity: Secondary | ICD-10-CM | POA: Diagnosis not present

## 2019-07-03 DIAGNOSIS — R600 Localized edema: Secondary | ICD-10-CM | POA: Diagnosis not present

## 2019-07-03 DIAGNOSIS — I872 Venous insufficiency (chronic) (peripheral): Secondary | ICD-10-CM | POA: Diagnosis not present

## 2019-07-03 DIAGNOSIS — R634 Abnormal weight loss: Secondary | ICD-10-CM | POA: Diagnosis not present

## 2019-07-04 DIAGNOSIS — I872 Venous insufficiency (chronic) (peripheral): Secondary | ICD-10-CM | POA: Diagnosis not present

## 2019-07-04 DIAGNOSIS — I70299 Other atherosclerosis of native arteries of extremities, unspecified extremity: Secondary | ICD-10-CM | POA: Diagnosis not present

## 2019-07-04 DIAGNOSIS — L97819 Non-pressure chronic ulcer of other part of right lower leg with unspecified severity: Secondary | ICD-10-CM | POA: Diagnosis not present

## 2019-07-04 DIAGNOSIS — I890 Lymphedema, not elsewhere classified: Secondary | ICD-10-CM | POA: Diagnosis not present

## 2019-07-04 DIAGNOSIS — R600 Localized edema: Secondary | ICD-10-CM | POA: Diagnosis not present

## 2019-07-04 DIAGNOSIS — R634 Abnormal weight loss: Secondary | ICD-10-CM | POA: Diagnosis not present

## 2019-07-08 DIAGNOSIS — I70299 Other atherosclerosis of native arteries of extremities, unspecified extremity: Secondary | ICD-10-CM | POA: Diagnosis not present

## 2019-07-08 DIAGNOSIS — R600 Localized edema: Secondary | ICD-10-CM | POA: Diagnosis not present

## 2019-07-08 DIAGNOSIS — L97819 Non-pressure chronic ulcer of other part of right lower leg with unspecified severity: Secondary | ICD-10-CM | POA: Diagnosis not present

## 2019-07-08 DIAGNOSIS — I890 Lymphedema, not elsewhere classified: Secondary | ICD-10-CM | POA: Diagnosis not present

## 2019-07-08 DIAGNOSIS — R634 Abnormal weight loss: Secondary | ICD-10-CM | POA: Diagnosis not present

## 2019-07-08 DIAGNOSIS — I872 Venous insufficiency (chronic) (peripheral): Secondary | ICD-10-CM | POA: Diagnosis not present

## 2019-07-10 DIAGNOSIS — I890 Lymphedema, not elsewhere classified: Secondary | ICD-10-CM | POA: Diagnosis not present

## 2019-07-10 DIAGNOSIS — R634 Abnormal weight loss: Secondary | ICD-10-CM | POA: Diagnosis not present

## 2019-07-10 DIAGNOSIS — R600 Localized edema: Secondary | ICD-10-CM | POA: Diagnosis not present

## 2019-07-10 DIAGNOSIS — I70299 Other atherosclerosis of native arteries of extremities, unspecified extremity: Secondary | ICD-10-CM | POA: Diagnosis not present

## 2019-07-10 DIAGNOSIS — I872 Venous insufficiency (chronic) (peripheral): Secondary | ICD-10-CM | POA: Diagnosis not present

## 2019-07-10 DIAGNOSIS — L97819 Non-pressure chronic ulcer of other part of right lower leg with unspecified severity: Secondary | ICD-10-CM | POA: Diagnosis not present

## 2019-07-11 DIAGNOSIS — I890 Lymphedema, not elsewhere classified: Secondary | ICD-10-CM | POA: Diagnosis not present

## 2019-07-11 DIAGNOSIS — R600 Localized edema: Secondary | ICD-10-CM | POA: Diagnosis not present

## 2019-07-11 DIAGNOSIS — I872 Venous insufficiency (chronic) (peripheral): Secondary | ICD-10-CM | POA: Diagnosis not present

## 2019-07-11 DIAGNOSIS — I70299 Other atherosclerosis of native arteries of extremities, unspecified extremity: Secondary | ICD-10-CM | POA: Diagnosis not present

## 2019-07-11 DIAGNOSIS — R634 Abnormal weight loss: Secondary | ICD-10-CM | POA: Diagnosis not present

## 2019-07-11 DIAGNOSIS — L97819 Non-pressure chronic ulcer of other part of right lower leg with unspecified severity: Secondary | ICD-10-CM | POA: Diagnosis not present

## 2019-07-13 DIAGNOSIS — I70299 Other atherosclerosis of native arteries of extremities, unspecified extremity: Secondary | ICD-10-CM | POA: Diagnosis not present

## 2019-07-13 DIAGNOSIS — I872 Venous insufficiency (chronic) (peripheral): Secondary | ICD-10-CM | POA: Diagnosis not present

## 2019-07-13 DIAGNOSIS — L97819 Non-pressure chronic ulcer of other part of right lower leg with unspecified severity: Secondary | ICD-10-CM | POA: Diagnosis not present

## 2019-07-13 DIAGNOSIS — I890 Lymphedema, not elsewhere classified: Secondary | ICD-10-CM | POA: Diagnosis not present

## 2019-07-13 DIAGNOSIS — R600 Localized edema: Secondary | ICD-10-CM | POA: Diagnosis not present

## 2019-07-13 DIAGNOSIS — R634 Abnormal weight loss: Secondary | ICD-10-CM | POA: Diagnosis not present

## 2019-07-14 DIAGNOSIS — I872 Venous insufficiency (chronic) (peripheral): Secondary | ICD-10-CM | POA: Diagnosis not present

## 2019-07-14 DIAGNOSIS — R600 Localized edema: Secondary | ICD-10-CM | POA: Diagnosis not present

## 2019-07-14 DIAGNOSIS — L97819 Non-pressure chronic ulcer of other part of right lower leg with unspecified severity: Secondary | ICD-10-CM | POA: Diagnosis not present

## 2019-07-14 DIAGNOSIS — R634 Abnormal weight loss: Secondary | ICD-10-CM | POA: Diagnosis not present

## 2019-07-14 DIAGNOSIS — I70299 Other atherosclerosis of native arteries of extremities, unspecified extremity: Secondary | ICD-10-CM | POA: Diagnosis not present

## 2019-07-14 DIAGNOSIS — I890 Lymphedema, not elsewhere classified: Secondary | ICD-10-CM | POA: Diagnosis not present

## 2019-07-15 DIAGNOSIS — R600 Localized edema: Secondary | ICD-10-CM | POA: Diagnosis not present

## 2019-07-15 DIAGNOSIS — L97819 Non-pressure chronic ulcer of other part of right lower leg with unspecified severity: Secondary | ICD-10-CM | POA: Diagnosis not present

## 2019-07-15 DIAGNOSIS — I890 Lymphedema, not elsewhere classified: Secondary | ICD-10-CM | POA: Diagnosis not present

## 2019-07-15 DIAGNOSIS — I70299 Other atherosclerosis of native arteries of extremities, unspecified extremity: Secondary | ICD-10-CM | POA: Diagnosis not present

## 2019-07-15 DIAGNOSIS — I872 Venous insufficiency (chronic) (peripheral): Secondary | ICD-10-CM | POA: Diagnosis not present

## 2019-07-15 DIAGNOSIS — R634 Abnormal weight loss: Secondary | ICD-10-CM | POA: Diagnosis not present

## 2019-07-16 DIAGNOSIS — L600 Ingrowing nail: Secondary | ICD-10-CM | POA: Diagnosis not present

## 2019-07-16 DIAGNOSIS — B353 Tinea pedis: Secondary | ICD-10-CM | POA: Diagnosis not present

## 2019-07-16 DIAGNOSIS — M2042 Other hammer toe(s) (acquired), left foot: Secondary | ICD-10-CM | POA: Diagnosis not present

## 2019-07-16 DIAGNOSIS — I739 Peripheral vascular disease, unspecified: Secondary | ICD-10-CM | POA: Diagnosis not present

## 2019-07-16 DIAGNOSIS — M79674 Pain in right toe(s): Secondary | ICD-10-CM | POA: Diagnosis not present

## 2019-07-16 DIAGNOSIS — B351 Tinea unguium: Secondary | ICD-10-CM | POA: Diagnosis not present

## 2019-07-17 DIAGNOSIS — R600 Localized edema: Secondary | ICD-10-CM | POA: Diagnosis not present

## 2019-07-17 DIAGNOSIS — I890 Lymphedema, not elsewhere classified: Secondary | ICD-10-CM | POA: Diagnosis not present

## 2019-07-17 DIAGNOSIS — I872 Venous insufficiency (chronic) (peripheral): Secondary | ICD-10-CM | POA: Diagnosis not present

## 2019-07-17 DIAGNOSIS — R634 Abnormal weight loss: Secondary | ICD-10-CM | POA: Diagnosis not present

## 2019-07-17 DIAGNOSIS — I70299 Other atherosclerosis of native arteries of extremities, unspecified extremity: Secondary | ICD-10-CM | POA: Diagnosis not present

## 2019-07-17 DIAGNOSIS — L97819 Non-pressure chronic ulcer of other part of right lower leg with unspecified severity: Secondary | ICD-10-CM | POA: Diagnosis not present

## 2019-07-18 DIAGNOSIS — R634 Abnormal weight loss: Secondary | ICD-10-CM | POA: Diagnosis not present

## 2019-07-18 DIAGNOSIS — L97819 Non-pressure chronic ulcer of other part of right lower leg with unspecified severity: Secondary | ICD-10-CM | POA: Diagnosis not present

## 2019-07-18 DIAGNOSIS — I890 Lymphedema, not elsewhere classified: Secondary | ICD-10-CM | POA: Diagnosis not present

## 2019-07-18 DIAGNOSIS — R600 Localized edema: Secondary | ICD-10-CM | POA: Diagnosis not present

## 2019-07-18 DIAGNOSIS — I70299 Other atherosclerosis of native arteries of extremities, unspecified extremity: Secondary | ICD-10-CM | POA: Diagnosis not present

## 2019-07-18 DIAGNOSIS — I872 Venous insufficiency (chronic) (peripheral): Secondary | ICD-10-CM | POA: Diagnosis not present

## 2019-07-19 ENCOUNTER — Telehealth: Payer: Self-pay | Admitting: Family Medicine

## 2019-07-19 NOTE — Telephone Encounter (Signed)
[facility] RN Case Mgt called states that she is sending over a fax for Dr Etter Sjogren -Cheri Rous to issue a letter stating why the patient will need hospital equipment such as hospital bed.

## 2019-07-20 DIAGNOSIS — I70299 Other atherosclerosis of native arteries of extremities, unspecified extremity: Secondary | ICD-10-CM | POA: Diagnosis not present

## 2019-07-20 DIAGNOSIS — L97819 Non-pressure chronic ulcer of other part of right lower leg with unspecified severity: Secondary | ICD-10-CM | POA: Diagnosis not present

## 2019-07-20 DIAGNOSIS — I890 Lymphedema, not elsewhere classified: Secondary | ICD-10-CM | POA: Diagnosis not present

## 2019-07-20 DIAGNOSIS — I872 Venous insufficiency (chronic) (peripheral): Secondary | ICD-10-CM | POA: Diagnosis not present

## 2019-07-20 DIAGNOSIS — R634 Abnormal weight loss: Secondary | ICD-10-CM | POA: Diagnosis not present

## 2019-07-20 DIAGNOSIS — R600 Localized edema: Secondary | ICD-10-CM | POA: Diagnosis not present

## 2019-07-20 NOTE — Telephone Encounter (Signed)
Caller Name: [name], son Phone: [phone]  Pt son is requesting a call back from Dr. Etter Sjogren regarding the needs pt will have in the home including a hospital bed with special mattress. He said the one she has moves/vibrates and has been the most comfortable. She is bedridden and needs this mattress to prevent bedsore and skin breakdown.   Hospice is supposed to be d/c tomorrow 1/27. Pt has been with [facility].

## 2019-07-21 DIAGNOSIS — I70299 Other atherosclerosis of native arteries of extremities, unspecified extremity: Secondary | ICD-10-CM | POA: Diagnosis not present

## 2019-07-21 DIAGNOSIS — R600 Localized edema: Secondary | ICD-10-CM | POA: Diagnosis not present

## 2019-07-21 DIAGNOSIS — I890 Lymphedema, not elsewhere classified: Secondary | ICD-10-CM | POA: Diagnosis not present

## 2019-07-21 DIAGNOSIS — I872 Venous insufficiency (chronic) (peripheral): Secondary | ICD-10-CM | POA: Diagnosis not present

## 2019-07-21 DIAGNOSIS — R634 Abnormal weight loss: Secondary | ICD-10-CM | POA: Diagnosis not present

## 2019-07-21 DIAGNOSIS — L97819 Non-pressure chronic ulcer of other part of right lower leg with unspecified severity: Secondary | ICD-10-CM | POA: Diagnosis not present

## 2019-07-21 NOTE — Telephone Encounter (Signed)
Please see messages below. I received the order request.

## 2019-07-21 NOTE — Telephone Encounter (Signed)
Son called again today stating that he is still waiting on a call back but he wanted to add that the name of the mattress is....   [employer] 3000 is the type of mattress that her son is requesting.  Pt is also requesting a call back from Dr. Etter Sjogren and not CMA..// [place]

## 2019-07-21 NOTE — Telephone Encounter (Signed)
Called hospice and left VM with Charlynne Cousins to resend fax.

## 2019-07-21 NOTE — Telephone Encounter (Signed)
Ok to send them rx for hospital bed mentioned but I have not seen a fax from them  Please call son

## 2019-07-21 NOTE — Telephone Encounter (Signed)
Left VM. Advised pt we have not received fax regarding hospital bed yet. Advised to call back.

## 2019-07-22 ENCOUNTER — Other Ambulatory Visit: Payer: Self-pay | Admitting: Family Medicine

## 2019-07-22 DIAGNOSIS — M159 Polyosteoarthritis, unspecified: Secondary | ICD-10-CM

## 2019-07-23 NOTE — Telephone Encounter (Signed)
Waiting for call back form [facility]

## 2019-07-26 ENCOUNTER — Telehealth: Payer: Self-pay | Admitting: *Deleted

## 2019-07-26 NOTE — Telephone Encounter (Signed)
Katherine Owen, [name], DO  Trenda Moots; Sanda Linger, CMA; Doylene Canning, [place] told us [facility] would help with this--- she is a hospice pt and it is very difficult to come to office--- -heather and [name] know about this       Previous Messages   ----- Message -----  From: Trenda Moots  Sent: 07/23/2019 12:27 PM EST  To: Ann Held, DO, *   Please advise  ----- Message -----  From: Reche Dixon  Sent: 07/23/2019 12:17 PM EST  To: Reche Dixon, Trenda Moots   Happy Friday! As much as I want to help you it is very hard to see if she would qualify for home health. I cannot find a recent office visit note to determine if meets medical criteria for home health.  With medicare she have to have a face to face visit either in person or virtually (video enabled)  The order for home health nursing is for foley cath care. Under insurance guidelines we are not able to see someone for just cath care.  So without the office visit we are not able to determine if we can see.  If I can answer any questions, please call me at [phone] or message here.   ----- Message -----  From: Trenda Moots  Sent: 07/23/2019  8:18 AM EST  To: Windle Guard can you assist with this pt. DOB 03/31/1928 Thanks

## 2019-07-26 NOTE — Telephone Encounter (Signed)
Left message on machine for son to call back to set up virtual appointment.

## 2019-07-27 ENCOUNTER — Telehealth: Payer: Self-pay

## 2019-07-27 ENCOUNTER — Ambulatory Visit (INDEPENDENT_AMBULATORY_CARE_PROVIDER_SITE_OTHER): Payer: Medicare Other | Admitting: Family Medicine

## 2019-07-27 ENCOUNTER — Other Ambulatory Visit: Payer: Self-pay

## 2019-07-27 ENCOUNTER — Encounter: Payer: Self-pay | Admitting: Family Medicine

## 2019-07-27 DIAGNOSIS — M48061 Spinal stenosis, lumbar region without neurogenic claudication: Secondary | ICD-10-CM

## 2019-07-27 DIAGNOSIS — M792 Neuralgia and neuritis, unspecified: Secondary | ICD-10-CM | POA: Diagnosis not present

## 2019-07-27 DIAGNOSIS — E785 Hyperlipidemia, unspecified: Secondary | ICD-10-CM | POA: Diagnosis not present

## 2019-07-27 DIAGNOSIS — K5901 Slow transit constipation: Secondary | ICD-10-CM | POA: Diagnosis not present

## 2019-07-27 DIAGNOSIS — R5381 Other malaise: Secondary | ICD-10-CM | POA: Diagnosis not present

## 2019-07-27 DIAGNOSIS — I10 Essential (primary) hypertension: Secondary | ICD-10-CM

## 2019-07-27 DIAGNOSIS — G894 Chronic pain syndrome: Secondary | ICD-10-CM

## 2019-07-27 MED ORDER — POLYETHYLENE GLYCOL 3350 17 GM/SCOOP PO POWD: 17.0000 g | Freq: Every day | ORAL | 0 refills | Status: AC | PRN

## 2019-07-27 MED ORDER — FENTANYL 25 MCG/HR TD PT72: 1.0000 | MEDICATED_PATCH | TRANSDERMAL | 0 refills | Status: DC

## 2019-07-27 NOTE — Assessment & Plan Note (Signed)
Severe Will try to wean fentanyl

## 2019-07-27 NOTE — Assessment & Plan Note (Signed)
Pain controlled Stable no  No new symptoms

## 2019-07-27 NOTE — Telephone Encounter (Signed)
PA initiated via Covermymeds; KEY: [id number]. Awaiting determination.

## 2019-07-27 NOTE — Assessment & Plan Note (Signed)
Encouraged heart healthy diet, increase exercise, avoid trans fats, consider a krill oil cap daily 

## 2019-07-27 NOTE — Assessment & Plan Note (Signed)
Unable to check bp today Home health ordered

## 2019-07-27 NOTE — Progress Notes (Signed)
Virtual Visit via Video Note  I connected with Katherine Owen on 07/27/19 at  1:00 PM EST by a video enabled telemedicine application and verified that I am speaking with the correct person using two identifiers.  Location: Patient: home with son  Provider: office   I discussed the limitations of evaluation and management by telemedicine and the availability of in person appointments. The patient expressed understanding and agreed to proceed.  History of Present Illness: Pt was d/c from hospice but is completely bed ridden and unable to get out.  Home health needed virtual visit before they could come out to see her --- she needs to keep the hosp bed she has and needs foley cath care.    They would also like to try to wean her off the fentanyl patch   Past Medical History:  Diagnosis Date  . Allergy   . Arthritis   . Cataract    Bilateral  . Essential hypertension 05/02/2014  . GERD (gastroesophageal reflux disease)   . Headache   . History of shingles   . HLD (hyperlipidemia) 05/02/2014  . Hyperlipidemia   . Hypertension   . Neuropathic pain 05/02/2014  . Osteoarthritis of both shoulders due to rotator cuff injury 04/23/2016  . Poor mobility 08/28/2015  . Pressure ulcer   . 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 Spinal stenosis of lumbar region 05/02/2014   S/p decompression at [facility] November 2015    Current Outpatient Medications on File Prior to Visit  Medication Sig Dispense Refill  . acetaminophen (TYLENOL) 325 MG tablet Take 650 mg by mouth every 4 (four) hours as needed.    . celecoxib (CELEBREX) 200 MG capsule Take 1 capsule (200 mg total) by mouth daily. 100 capsule 0  . Cholecalciferol (VITAMIN D3) 5000 units CAPS Take 5,000 Units by mouth at bedtime.     . furosemide (LASIX) 20 MG tablet Take 20 mg by mouth.    . gabapentin (NEURONTIN) 100 MG capsule Take 1 capsule (100 mg total) by mouth 3 (three) times daily. 270 capsule 1  . guaifenesin (ROBITUSSIN) 100  MG/5ML syrup Take 200 mg by mouth every 4 (four) hours as needed for cough.    . losartan (COZAAR) 100 MG tablet Take 1 tablet (100 mg total) by mouth daily. 90 tablet 1  . Misc. Devices [facility]) MISC Use as directed 1 each 0  . montelukast (SINGULAIR) 10 MG tablet Take 1 tablet (10 mg total) by mouth at bedtime. 90 tablet 1  . Multiple Vitamins-Minerals (CERTAVITE/ANTIOXIDANTS PO) Take 1 tablet by mouth daily.    . NONFORMULARY OR COMPOUNDED ITEM Lift recliner #1  As directed  Dx spinal stenosis, osteoarthritis both shoulder ,  Low ext weakness 1 each 0  . pantoprazole (PROTONIX) 40 MG tablet Take 1 tablet (40 mg total) by mouth 2 (two) times daily. 60 tablet 5  . SANTYL ointment Apply 1 application topically daily.  3  . senna-docusate (SENOKOT-S) 8.6-50 MG per tablet Take 1 tablet by mouth at bedtime.     . simethicone (MYLICON) 0000000 MG chewable tablet Chew 125 mg by mouth 3 (three) times daily after meals.    . traMADol (ULTRAM) 50 MG tablet Take 50 mg by mouth every 6 (six) hours as needed.      No current facility-administered medications on file prior to visit.   Social History   Socioeconomic History  . Marital status: Widowed    Spouse name: Not on file  . Number of  children: 3  . Years of education: Not on file  . Highest education level: Not on file  Occupational History  . 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 Smoking status: Former Smoker    Packs/day: 1.00    Years: 20.00    Pack years: 20.00    Types: Cigarettes    Quit date: 04/16/1967    Years since quitting: 52.3  . Smokeless tobacco: Never Used  . Tobacco comment: quit somking in early  1970's  Substance and Sexual Activity  . Alcohol use: No    Alcohol/week: 0.0 standard drinks  . Drug use: No  . Sexual activity: Not Currently  Other Topics Concern  . Not on file  Social History Narrative  . Not on file   Social Determinants of Health   Financial Resource Strain:   .  Difficulty of Paying Living Expenses: Not on file  Food Insecurity:   . Worried About Charity fundraiser in the Last Year: Not on file  . Ran Out of Food in the Last Year: Not on file  Transportation Needs:   . Lack of Transportation (Medical): Not on file  . Lack of Transportation (Non-Medical): Not on file  Physical Activity:   . Days of Exercise per Week: Not on file  . Minutes of Exercise per Session: Not on file  Stress:   . Feeling of Stress : Not on file  Social Connections:   . Frequency of Communication with Friends and Family: Not on file  . Frequency of Social Gatherings with Friends and Family: Not on file  . Attends Religious Services: Not on file  . Active Member of Clubs or Organizations: Not on file  . Attends Archivist Meetings: Not on file  . Marital Status: Not on file  Intimate Partner Violence:   . Fear of Current or Ex-Partner: Not on file  . Emotionally Abused: Not on file  . Physically Abused: Not on file  . Sexually Abused: Not on file   No Known Allergies  Observations/Objective: Vitals:  no vitals available Pt is in nad She is able to answer all questions    Assessment and Plan: 1. Slow transit constipation Refill med - polyethylene glycol powder (MIRALAX) 17 GM/SCOOP powder; Take 17 g by mouth daily as needed.  Dispense: 255 g; Refill: 0  2. Chronic pain syndrome Wean fentanyl  - fentaNYL (DURAGESIC) 25 MCG/HR; Place 1 patch onto the skin every 3 (three) days.  Dispense: 10 patch; Refill: 0  3. Hyperlipidemia, unspecified hyperlipidemia type Encouraged heart healthy diet, increase exercise, avoid trans fats, consider a krill oil cap daily  4. Spinal stenosis of lumbar region, unspecified whether neurogenic claudication present No new symptoms , pain is well controlled   5. Neuropathic pain severe  6. Essential hypertension Well controlled, no changes to meds. Encouraged heart healthy diet such as the DASH diet and exercise  as tolerated.  -- per pt and son Unable to get vitals today HH to come to house  7. Declining functional status Pt is bed ridden HH to come to h0use    Follow Up Instructions:    I discussed the assessment and treatment plan with the patient. The patient was provided an opportunity to ask questions and all were answered. The patient agreed with the plan and demonstrated an understanding of the instructions.   The patient was advised to call back or seek an in-person evaluation if the symptoms worsen or if the condition  fails to improve as anticipated.  I provided 25 minutes of non-face-to-face time during this encounter.   Ann Held, DO

## 2019-07-27 NOTE — Assessment & Plan Note (Signed)
Pt is bed ridden

## 2019-07-28 NOTE — Telephone Encounter (Signed)
PA approved.   Approved. This drug has been approved under the Member's Medicare Part D benefit. Approved quantity: 10 Patches per 30 day(s). You may fill up to a 90 day supply except for those on Specialty Tier 5, which can be filled up to a 30 day supply. Please call the pharmacy to process the prescription claim.

## 2019-07-30 ENCOUNTER — Other Ambulatory Visit: Payer: Self-pay | Admitting: Family Medicine

## 2019-07-30 ENCOUNTER — Telehealth: Payer: Self-pay | Admitting: Family Medicine

## 2019-07-30 DIAGNOSIS — G894 Chronic pain syndrome: Secondary | ICD-10-CM

## 2019-07-30 MED ORDER — FENTANYL 12 MCG/HR TD PT72: MEDICATED_PATCH | TRANSDERMAL | 0 refills | Status: AC

## 2019-07-30 MED ORDER — FENTANYL 25 MCG/HR TD PT72: 1.0000 | MEDICATED_PATCH | TRANSDERMAL | 0 refills | Status: AC

## 2019-07-30 NOTE — Telephone Encounter (Signed)
PT's son Katherine Owen calling stating his mother is in a lot of Pain and it they may need to put her back on 12mg  of Fentayl. Please Contact patient

## 2019-07-30 NOTE — Telephone Encounter (Signed)
Message sent to Katherine Owen regarding concerns

## 2019-07-30 NOTE — Telephone Encounter (Signed)
I sent in the patch to pharmacy I received a message from home health that they can not do the cath changes--- for some reason they thought that was the only reason they were needed.-------- pt is bed ridden --- she needs help  Please find out from them what needs to change.

## 2019-07-30 NOTE — Telephone Encounter (Signed)
Please advise 

## 2019-08-06 NOTE — Telephone Encounter (Signed)
Another message sent yesterday to [facility]. No return messages yet

## 2019-08-10 ENCOUNTER — Telehealth: Payer: Self-pay | Admitting: Family Medicine

## 2019-08-10 ENCOUNTER — Other Ambulatory Visit: Payer: Self-pay | Admitting: Family Medicine

## 2019-08-10 DIAGNOSIS — I10 Essential (primary) hypertension: Secondary | ICD-10-CM

## 2019-08-10 DIAGNOSIS — R5381 Other malaise: Secondary | ICD-10-CM

## 2019-08-10 DIAGNOSIS — E785 Hyperlipidemia, unspecified: Secondary | ICD-10-CM

## 2019-08-10 DIAGNOSIS — M159 Polyosteoarthritis, unspecified: Secondary | ICD-10-CM

## 2019-08-10 MED ORDER — LOSARTAN POTASSIUM 100 MG PO TABS: 100.0000 mg | ORAL_TABLET | Freq: Every day | ORAL | 1 refills | Status: DC

## 2019-08-10 NOTE — Telephone Encounter (Signed)
Losartan hasn't been refilled since 2019. Okay to refill? Last visit was a virtual on 07/27/19. Please advise

## 2019-08-10 NOTE — Telephone Encounter (Signed)
Im pretty sure hospice was filling it --- ok to refill for 6 months

## 2019-08-10 NOTE — Telephone Encounter (Signed)
Refill sent.

## 2019-08-10 NOTE — Telephone Encounter (Signed)
Medication: losartan (COZAAR) 100 MG tablet   Has the patient contacted their pharmacy? No. (If no, request that the patient contact the pharmacy for the refill.) (If yes, when and what did the pharmacy advise?)  Preferred Pharmacy (with phone number or street name):   [facility] [place]  [address] [place], [place] [address]  Phone:  [phone] Fax:  [phone]   Agent: Please be advised that RX refills may take up to 3 business days. We ask that you follow-up with your pharmacy.

## 2019-08-10 NOTE — Telephone Encounter (Signed)
Holly from Hospice called and states that [facility] needs faxed over orders for Katherine Owen to have current equipment and mattress sent over.  Patient was discharged from hospice on 07/21/2019 so order should be effective as on 07/21/2019.  Patient's medicare policy number is : mcr # [id number] Denny Peon is rep from : [facility]  Fax [phone]

## 2019-08-11 NOTE — Telephone Encounter (Signed)
Please advise 

## 2019-08-12 NOTE — Telephone Encounter (Signed)
[name] and sheketia were working on this  I'm not sure what happened Ive put referrals in for Home health and heather said she faxed orders to [facility]  But this poor lady and her family have been waiting for this for awhile I cant fax from home  ---  if it has to be faxed again I need to know exactly what equipment needs to be on the order --- pretty sure I can not just write con't current equipment

## 2019-08-17 ENCOUNTER — Other Ambulatory Visit: Payer: Self-pay

## 2019-08-17 DIAGNOSIS — M150 Primary generalized (osteo)arthritis: Secondary | ICD-10-CM

## 2019-08-17 DIAGNOSIS — R5381 Other malaise: Secondary | ICD-10-CM

## 2019-08-17 DIAGNOSIS — E785 Hyperlipidemia, unspecified: Secondary | ICD-10-CM

## 2019-08-17 DIAGNOSIS — M159 Polyosteoarthritis, unspecified: Secondary | ICD-10-CM

## 2019-08-17 DIAGNOSIS — I10 Essential (primary) hypertension: Secondary | ICD-10-CM

## 2019-08-17 NOTE — Telephone Encounter (Signed)
Caller/Agency: Cecille Rubin w/ [facility] Callback Number: [phone]  Cecille Rubin states that from today's referral she has spoken with her supervisor and they are going to accept the patient for a Home Health SN eval.   1/28 pt was denied for [facility] referral as she had not had a face 2 face in person or video appointment and did not meet the qualifications to be setup for [facility]. She noted that [facility] had been notified. She stated that 3 other instances the pt was denied because the care being requested was not a covered service for [facility] based on Medicare guidelines. They are not able to go into the home long term for foley cath changes. They are not able to go into the home long term for wound care/dressing changes.   She noted they will go for an eval and determine what the pts needs are. From there they will likely provide a certain number of visits to teach the family on changing the foley cath and wound dressings.

## 2019-08-18 DIAGNOSIS — M48061 Spinal stenosis, lumbar region without neurogenic claudication: Secondary | ICD-10-CM | POA: Diagnosis not present

## 2019-08-19 ENCOUNTER — Telehealth: Payer: Self-pay

## 2019-08-19 NOTE — Telephone Encounter (Signed)
Verbal orders given  

## 2019-08-19 NOTE — Telephone Encounter (Signed)
Patients RN Producer, television/film/video from [place] called in to get Verbal orders for nursing once a week for 9 weeks. Patients nurse would like to request a OT test as well.   Please give Amber a call at [phone]

## 2019-08-19 NOTE — Telephone Encounter (Signed)
Left VM with Pt's son to see if he has heard anything from home health. Asked to call back.

## 2019-08-20 ENCOUNTER — Telehealth: Payer: Self-pay | Admitting: Family Medicine

## 2019-08-20 NOTE — Telephone Encounter (Signed)
Verbal confirmed 

## 2019-08-20 NOTE — Telephone Encounter (Signed)
Per Collier Salina with physical therapy is requesting Verbal for Physical Therapy   Caller/Agency: Earlie Lou Number: [phone]  Requesting OT (Eval for Hand Demformity /PT Work/Speech Therapy:  Frequency: Physical Therapy :   2 times a week for 4weeks

## 2019-08-27 DIAGNOSIS — G894 Chronic pain syndrome: Secondary | ICD-10-CM | POA: Diagnosis not present

## 2019-08-27 DIAGNOSIS — I82409 Acute embolism and thrombosis of unspecified deep veins of unspecified lower extremity: Secondary | ICD-10-CM | POA: Diagnosis not present

## 2019-08-27 DIAGNOSIS — Z7189 Other specified counseling: Secondary | ICD-10-CM | POA: Diagnosis not present

## 2019-08-27 DIAGNOSIS — Z7401 Bed confinement status: Secondary | ICD-10-CM | POA: Diagnosis not present

## 2019-08-27 DIAGNOSIS — Z0000 Encounter for general adult medical examination without abnormal findings: Secondary | ICD-10-CM | POA: Diagnosis not present

## 2019-08-27 DIAGNOSIS — H6123 Impacted cerumen, bilateral: Secondary | ICD-10-CM | POA: Diagnosis not present

## 2019-08-29 DIAGNOSIS — I82409 Acute embolism and thrombosis of unspecified deep veins of unspecified lower extremity: Secondary | ICD-10-CM | POA: Diagnosis not present

## 2019-09-06 DIAGNOSIS — Z7189 Other specified counseling: Secondary | ICD-10-CM | POA: Diagnosis not present

## 2019-09-17 DIAGNOSIS — G894 Chronic pain syndrome: Secondary | ICD-10-CM | POA: Diagnosis not present

## 2019-09-17 DIAGNOSIS — I10 Essential (primary) hypertension: Secondary | ICD-10-CM | POA: Diagnosis not present

## 2019-09-17 DIAGNOSIS — H269 Unspecified cataract: Secondary | ICD-10-CM | POA: Diagnosis not present

## 2019-09-17 DIAGNOSIS — Z9981 Dependence on supplemental oxygen: Secondary | ICD-10-CM | POA: Diagnosis not present

## 2019-09-17 DIAGNOSIS — M8949 Other hypertrophic osteoarthropathy, multiple sites: Secondary | ICD-10-CM | POA: Diagnosis not present

## 2019-09-17 DIAGNOSIS — K219 Gastro-esophageal reflux disease without esophagitis: Secondary | ICD-10-CM | POA: Diagnosis not present

## 2019-09-17 DIAGNOSIS — M792 Neuralgia and neuritis, unspecified: Secondary | ICD-10-CM | POA: Diagnosis not present

## 2019-09-17 DIAGNOSIS — Z466 Encounter for fitting and adjustment of urinary device: Secondary | ICD-10-CM | POA: Diagnosis not present

## 2019-09-17 DIAGNOSIS — M48061 Spinal stenosis, lumbar region without neurogenic claudication: Secondary | ICD-10-CM | POA: Diagnosis not present

## 2019-09-17 DIAGNOSIS — Z79891 Long term (current) use of opiate analgesic: Secondary | ICD-10-CM | POA: Diagnosis not present

## 2019-09-17 DIAGNOSIS — K5901 Slow transit constipation: Secondary | ICD-10-CM | POA: Diagnosis not present

## 2019-09-17 DIAGNOSIS — E785 Hyperlipidemia, unspecified: Secondary | ICD-10-CM | POA: Diagnosis not present

## 2019-09-17 DIAGNOSIS — Z87891 Personal history of nicotine dependence: Secondary | ICD-10-CM | POA: Diagnosis not present

## 2019-09-18 DIAGNOSIS — K5909 Other constipation: Secondary | ICD-10-CM | POA: Diagnosis not present

## 2019-09-18 DIAGNOSIS — M4800 Spinal stenosis, site unspecified: Secondary | ICD-10-CM | POA: Diagnosis not present

## 2019-09-18 DIAGNOSIS — Z7401 Bed confinement status: Secondary | ICD-10-CM | POA: Diagnosis not present

## 2019-09-18 DIAGNOSIS — G894 Chronic pain syndrome: Secondary | ICD-10-CM | POA: Diagnosis not present

## 2019-09-30 DIAGNOSIS — M21969 Unspecified acquired deformity of unspecified lower leg: Secondary | ICD-10-CM | POA: Diagnosis not present

## 2019-09-30 DIAGNOSIS — M25511 Pain in right shoulder: Secondary | ICD-10-CM | POA: Diagnosis not present

## 2019-09-30 DIAGNOSIS — M25512 Pain in left shoulder: Secondary | ICD-10-CM | POA: Diagnosis not present

## 2019-09-30 DIAGNOSIS — M5412 Radiculopathy, cervical region: Secondary | ICD-10-CM | POA: Diagnosis not present

## 2019-09-30 DIAGNOSIS — M069 Rheumatoid arthritis, unspecified: Secondary | ICD-10-CM | POA: Diagnosis not present

## 2019-09-30 DIAGNOSIS — G8929 Other chronic pain: Secondary | ICD-10-CM | POA: Diagnosis not present

## 2019-09-30 DIAGNOSIS — M961 Postlaminectomy syndrome, not elsewhere classified: Secondary | ICD-10-CM | POA: Diagnosis not present

## 2019-10-01 DIAGNOSIS — B353 Tinea pedis: Secondary | ICD-10-CM | POA: Diagnosis not present

## 2019-10-01 DIAGNOSIS — M2042 Other hammer toe(s) (acquired), left foot: Secondary | ICD-10-CM | POA: Diagnosis not present

## 2019-10-01 DIAGNOSIS — M79674 Pain in right toe(s): Secondary | ICD-10-CM | POA: Diagnosis not present

## 2019-10-01 DIAGNOSIS — B351 Tinea unguium: Secondary | ICD-10-CM | POA: Diagnosis not present

## 2019-10-16 ENCOUNTER — Emergency Department (HOSPITAL_COMMUNITY)
Admission: EM | Admit: 2019-10-16 | Discharge: 2019-10-17 | Disposition: A | Discharge status: Home / Self Care | Payer: Medicare Other | Attending: Emergency Medicine | Admitting: Emergency Medicine

## 2019-10-16 ENCOUNTER — Emergency Department (HOSPITAL_COMMUNITY): Payer: Medicare Other

## 2019-10-16 DIAGNOSIS — K5903 Drug induced constipation: Secondary | ICD-10-CM | POA: Diagnosis not present

## 2019-10-16 DIAGNOSIS — K5900 Constipation, unspecified: Secondary | ICD-10-CM | POA: Diagnosis not present

## 2019-10-16 DIAGNOSIS — R1084 Generalized abdominal pain: Secondary | ICD-10-CM | POA: Diagnosis not present

## 2019-10-16 DIAGNOSIS — Z96643 Presence of artificial hip joint, bilateral: Secondary | ICD-10-CM | POA: Insufficient documentation

## 2019-10-16 DIAGNOSIS — R52 Pain, unspecified: Secondary | ICD-10-CM | POA: Diagnosis not present

## 2019-10-16 DIAGNOSIS — D259 Leiomyoma of uterus, unspecified: Secondary | ICD-10-CM | POA: Diagnosis not present

## 2019-10-16 DIAGNOSIS — R0902 Hypoxemia: Secondary | ICD-10-CM | POA: Diagnosis not present

## 2019-10-16 DIAGNOSIS — I10 Essential (primary) hypertension: Secondary | ICD-10-CM | POA: Diagnosis not present

## 2019-10-16 DIAGNOSIS — Z87891 Personal history of nicotine dependence: Secondary | ICD-10-CM | POA: Diagnosis not present

## 2019-10-16 DIAGNOSIS — T402X5A Adverse effect of other opioids, initial encounter: Secondary | ICD-10-CM

## 2019-10-16 DIAGNOSIS — Z79899 Other long term (current) drug therapy: Secondary | ICD-10-CM | POA: Insufficient documentation

## 2019-10-16 NOTE — ED Triage Notes (Addendum)
Pt from home by [facility] for constipation. Hx of the same. Pt is bed bound. Family tried OTC medications and prescription meds without relief.

## 2019-10-17 ENCOUNTER — Encounter (HOSPITAL_COMMUNITY): Payer: Self-pay | Admitting: Emergency Medicine

## 2019-10-17 ENCOUNTER — Emergency Department (HOSPITAL_COMMUNITY): Payer: Medicare Other

## 2019-10-17 DIAGNOSIS — K219 Gastro-esophageal reflux disease without esophagitis: Secondary | ICD-10-CM | POA: Diagnosis not present

## 2019-10-17 DIAGNOSIS — Z7901 Long term (current) use of anticoagulants: Secondary | ICD-10-CM | POA: Diagnosis not present

## 2019-10-17 DIAGNOSIS — Z87891 Personal history of nicotine dependence: Secondary | ICD-10-CM | POA: Diagnosis not present

## 2019-10-17 DIAGNOSIS — K5901 Slow transit constipation: Secondary | ICD-10-CM | POA: Diagnosis not present

## 2019-10-17 DIAGNOSIS — Z993 Dependence on wheelchair: Secondary | ICD-10-CM | POA: Diagnosis not present

## 2019-10-17 DIAGNOSIS — Z9181 History of falling: Secondary | ICD-10-CM | POA: Diagnosis not present

## 2019-10-17 DIAGNOSIS — E785 Hyperlipidemia, unspecified: Secondary | ICD-10-CM | POA: Diagnosis not present

## 2019-10-17 DIAGNOSIS — D259 Leiomyoma of uterus, unspecified: Secondary | ICD-10-CM | POA: Diagnosis not present

## 2019-10-17 DIAGNOSIS — M48061 Spinal stenosis, lumbar region without neurogenic claudication: Secondary | ICD-10-CM | POA: Diagnosis not present

## 2019-10-17 DIAGNOSIS — M8949 Other hypertrophic osteoarthropathy, multiple sites: Secondary | ICD-10-CM | POA: Diagnosis not present

## 2019-10-17 DIAGNOSIS — K5903 Drug induced constipation: Secondary | ICD-10-CM | POA: Diagnosis not present

## 2019-10-17 DIAGNOSIS — M792 Neuralgia and neuritis, unspecified: Secondary | ICD-10-CM | POA: Diagnosis not present

## 2019-10-17 DIAGNOSIS — Z466 Encounter for fitting and adjustment of urinary device: Secondary | ICD-10-CM | POA: Diagnosis not present

## 2019-10-17 DIAGNOSIS — Z7401 Bed confinement status: Secondary | ICD-10-CM | POA: Diagnosis not present

## 2019-10-17 DIAGNOSIS — I10 Essential (primary) hypertension: Secondary | ICD-10-CM | POA: Diagnosis not present

## 2019-10-17 DIAGNOSIS — H269 Unspecified cataract: Secondary | ICD-10-CM | POA: Diagnosis not present

## 2019-10-17 DIAGNOSIS — G894 Chronic pain syndrome: Secondary | ICD-10-CM | POA: Diagnosis not present

## 2019-10-17 DIAGNOSIS — K5900 Constipation, unspecified: Secondary | ICD-10-CM | POA: Diagnosis not present

## 2019-10-17 DIAGNOSIS — M2550 Pain in unspecified joint: Secondary | ICD-10-CM | POA: Diagnosis not present

## 2019-10-17 DIAGNOSIS — Z79891 Long term (current) use of opiate analgesic: Secondary | ICD-10-CM | POA: Diagnosis not present

## 2019-10-17 LAB — URINALYSIS, MICROSCOPIC (REFLEX)

## 2019-10-17 LAB — CBC WITH DIFFERENTIAL/PLATELET
Abs Immature Granulocytes: 0.04 10*3/uL (ref 0.00–0.07)
Basophils Absolute: 0 10*3/uL (ref 0.0–0.1)
Basophils Relative: 0 %
Eosinophils Absolute: 0 10*3/uL (ref 0.0–0.5)
Eosinophils Relative: 0 %
HCT: 42.4 % (ref 36.0–46.0)
Hemoglobin: 12.6 g/dL (ref 12.0–15.0)
Immature Granulocytes: 0 %
Lymphocytes Relative: 28 %
Lymphs Abs: 2.9 10*3/uL (ref 0.7–4.0)
MCH: 26.6 pg (ref 26.0–34.0)
MCHC: 29.7 g/dL — ABNORMAL LOW (ref 30.0–36.0)
MCV: 89.5 fL (ref 80.0–100.0)
Monocytes Absolute: 0.7 10*3/uL (ref 0.1–1.0)
Monocytes Relative: 6 %
Neutro Abs: 6.8 10*3/uL (ref 1.7–7.7)
Neutrophils Relative %: 66 %
Platelets: 270 10*3/uL (ref 150–400)
RBC: 4.74 MIL/uL (ref 3.87–5.11)
RDW: 15.8 % — ABNORMAL HIGH (ref 11.5–15.5)
WBC: 10.5 10*3/uL (ref 4.0–10.5)
nRBC: 0 % (ref 0.0–0.2)

## 2019-10-17 LAB — I-STAT CHEM 8, ED
BUN: 25 mg/dL — ABNORMAL HIGH (ref 8–23)
Calcium, Ion: 1.11 mmol/L — ABNORMAL LOW (ref 1.15–1.40)
Chloride: 96 mmol/L — ABNORMAL LOW (ref 98–111)
Creatinine, Ser: 0.4 mg/dL — ABNORMAL LOW (ref 0.44–1.00)
Glucose, Bld: 99 mg/dL (ref 70–99)
HCT: 40 % (ref 36.0–46.0)
Hemoglobin: 13.6 g/dL (ref 12.0–15.0)
Potassium: 4.8 mmol/L (ref 3.5–5.1)
Sodium: 135 mmol/L (ref 135–145)
TCO2: 36 mmol/L — ABNORMAL HIGH (ref 22–32)

## 2019-10-17 LAB — URINALYSIS, ROUTINE W REFLEX MICROSCOPIC
Bilirubin Urine: NEGATIVE
Glucose, UA: NEGATIVE mg/dL
Ketones, ur: NEGATIVE mg/dL
Nitrite: POSITIVE — AB
Protein, ur: NEGATIVE mg/dL
Specific Gravity, Urine: <1.005 — ABNORMAL LOW (ref 1.005–1.030)
pH: 5.5 (ref 5.0–8.0)

## 2019-10-17 MED ORDER — HYDROCORTISONE ACETATE 25 MG RE SUPP
25.0000 mg | Freq: Once | RECTAL | Status: DC
  Filled 2019-10-17: qty 1

## 2019-10-17 MED ORDER — POLYETHYLENE GLYCOL 3350 17 G PO PACK
17.0000 g | PACK | Freq: Every day | ORAL | Status: DC
  Administered 2019-10-17: 17 g via ORAL
  Filled 2019-10-17 (×2): qty 1

## 2019-10-17 MED ORDER — MILK AND MOLASSES ENEMA
1.0000 | Freq: Once | RECTAL | Status: AC
  Administered 2019-10-17: 240 mL via RECTAL
  Filled 2019-10-17: qty 240

## 2019-10-17 MED ORDER — IOHEXOL 300 MG/ML  SOLN
100.0000 mL | Freq: Once | INTRAMUSCULAR | Status: AC | PRN
  Administered 2019-10-17: 100 mL via INTRAVENOUS

## 2019-10-17 MED ORDER — LACTULOSE 20 G PO PACK: 20.0000 g | PACK | Freq: Two times a day (BID) | ORAL | 0 refills | Status: AC

## 2019-10-17 NOTE — ED Notes (Signed)
Order for retention enema placed.

## 2019-10-17 NOTE — Discharge Instructions (Addendum)
Miralax 3 times a day for 7 days and then twice a day.  Must drink 1.5 liters of WATER a day.

## 2019-10-17 NOTE — ED Provider Notes (Signed)
Pt signed out by Dr. Randal Buba pending enema.  Pt had the enema and had a large bm.  She feels much better.  Pt is stable for d/c.  Return if worse.   Isla Pence, MD 10/17/19 [phone]

## 2019-10-17 NOTE — ED Notes (Signed)
Pt taken to CT via stretcher in stable condition with face mask in place. 

## 2019-10-17 NOTE — ED Provider Notes (Signed)
[place] EMERGENCY DEPARTMENT Provider Note   CSN: [id number] Arrival date & time: 10/16/19  1921     History Chief Complaint  Patient presents with  . Constipation    Katherine Owen is a 86 y.o. female.   Constipation Severity:  Severe Time since last bowel movement:  1 week Timing:  Constant Progression:  Unchanged Chronicity:  Recurrent Context: dehydration and narcotics   Stool description:  None produced Relieved by:  Nothing Worsened by:  Narcotic pain medications Ineffective treatments:  Miralax Associated symptoms: no abdominal pain, no diarrhea, no fever and no flatus   Risk factors: no change in medication and no recent antibiotic use   Patient has chronic opioid induced constipation and presents with episode of same.  No f/c/r.  No vomiting.       Past Medical History:  Diagnosis Date  . Allergy   . Arthritis   . Cataract    Bilateral  . Essential hypertension 05/02/2014  . GERD (gastroesophageal reflux disease)   . Headache   . History of shingles   . HLD (hyperlipidemia) 05/02/2014  . Hyperlipidemia   . Hypertension   . Neuropathic pain 05/02/2014  . Osteoarthritis of both shoulders due to rotator cuff injury 04/23/2016  . Poor mobility 08/28/2015  . Pressure ulcer   . 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 Spinal stenosis of lumbar region 05/02/2014   S/p decompression at [facility] November 2015     Patient Active Problem List   Diagnosis Date Noted  . Nausea without vomiting 05/27/2018  . Bloating 05/27/2018  . Gas pain 05/27/2018  . PAD (peripheral artery disease) ([place]) 02/05/2018  . Skin ulcer of sacrum with necrosis of muscle ([place])   . Atherosclerosis of artery of extremity with ulceration ([place]) 02/03/2018  . Lymphedema 01/20/2018  . Chronic venous insufficiency 01/20/2018  . Declining functional status 12/24/2017  . Open wound of left lower leg 11/24/2017  . Open toe wound, initial encounter 11/24/2017  .  Lower extremity edema 07/18/2017  . Pain of left heel 04/08/2017  . Dyspepsia 03/18/2017  . Left shoulder pain 07/02/2016  . Cervical disc disorder with radiculopathy of cervical region 07/02/2016  . Osteoarthritis of both shoulders due to rotator cuff injury 04/23/2016  . 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 Poor mobility 08/28/2015  . Physical exam 10/27/2014  . Leg wound, left 06/29/2014  . Leg ulcer ([place]) 05/02/2014  . HLD (hyperlipidemia) 05/02/2014  . CN (constipation) 05/02/2014  . Spinal stenosis of lumbar region 05/02/2014  . Rhinitis, allergic 05/02/2014  . Neuropathic pain 05/02/2014  . Essential hypertension 05/02/2014    Past Surgical History:  Procedure Laterality Date  . ANKLE SURGERY Left 2014  . BUNIONECTOMY Right   . CATARACT EXTRACTION, BILATERAL    . CERVICAL DISC SURGERY Left   . COLONOSCOPY    . KNEE ARTHROSCOPY Bilateral   . LOWER EXTREMITY ANGIOGRAPHY Left 02/03/2018   Procedure: LOWER EXTREMITY ANGIOGRAPHY;  Surgeon: Katha Cabal, MD;  Location: [place] [facility];  Service: Cardiovascular;  Laterality: Left;  . LOWER EXTREMITY ANGIOGRAPHY Right 02/10/2018   Procedure: LOWER EXTREMITY ANGIOGRAPHY;  Surgeon: Katha Cabal, MD;  Location: [place] [facility];  Service: Cardiovascular;  Laterality: Right;  . REVERSE SHOULDER ARTHROPLASTY Right 01/11/2016   Procedure: REVERSE SHOULDER ARTHROPLASTY;  Surgeon: Justice Britain, MD;  Location: [facility];  Service: Orthopedics;  Laterality: Right;  . SPINE SURGERY     done at [facility] Apr 26, 2014, lumbar decompression  . TOTAL HIP ARTHROPLASTY Bilateral 2005, 2009   [place] , [place]     OB History   No obstetric history on file.     Family History  Problem Relation Age of Onset  . Arthritis Mother        osteoarthritis  . Colon cancer Mother   . Arthritis Father        osteoarthritis  . Parkinson's disease Father   . Cancer Brother        unknown type  . Anesthesia problems Neg Hx     . Esophageal cancer Neg Hx   . Stomach cancer Neg Hx   . Liver disease Neg Hx     Social History   Tobacco Use  . Smoking status: Former Smoker    Packs/day: 1.00    Years: 20.00    Pack years: 20.00    Types: Cigarettes    Quit date: 04/16/1967    Years since quitting: 52.5  . Smokeless tobacco: Never Used  . Tobacco comment: quit somking in early  1970's  Substance Use Topics  . Alcohol use: No    Alcohol/week: 0.0 standard drinks  . Drug use: No    Home Medications Prior to Admission medications   Medication Sig Start Date End Date Taking? Authorizing Provider  acetaminophen (TYLENOL) 325 MG tablet Take 650 mg by mouth every 4 (four) hours as needed.    [provider]  celecoxib (CELEBREX) 200 MG capsule Take 1 capsule (200 mg total) by mouth daily. 06/03/16   Ann Held, DO  Cholecalciferol (VITAMIN D3) 5000 units CAPS Take 5,000 Units by mouth at bedtime.     [provider]  fentaNYL (DURAGESIC) 12 MCG/HR Apply 1 patch Topical every three days  Along with Fentanyl 25 mcg to equal 37 mcg/ Hour 07/30/19   Lowne Chase, [name], DO  fentaNYL (DURAGESIC) 25 MCG/HR Place 1 patch onto the skin every 3 (three) days. 07/30/19   Ann Held, DO  furosemide (LASIX) 20 MG tablet Take 20 mg by mouth.    [provider]  gabapentin (NEURONTIN) 100 MG capsule Take 1 capsule (100 mg total) by mouth 3 (three) times daily. 11/19/17 07/27/19  Ann Held, DO  guaifenesin (ROBITUSSIN) 100 MG/5ML syrup Take 200 mg by mouth every 4 (four) hours as needed for cough.    [provider]  losartan (COZAAR) 100 MG tablet Take 1 tablet (100 mg total) by mouth daily. 08/10/19   Ann Held, DO  Misc. Devices [facility]) MISC Use as directed 05/02/17   Carollee Herter, [name], DO  montelukast (SINGULAIR) 10 MG tablet Take 1 tablet (10 mg total) by mouth at bedtime. 11/19/17   Ann Held, DO  Multiple Vitamins-Minerals  (CERTAVITE/ANTIOXIDANTS PO) Take 1 tablet by mouth daily.    [provider]  NONFORMULARY OR COMPOUNDED ITEM Lift recliner #1  As directed  Dx spinal stenosis, osteoarthritis both shoulder ,  Low ext weakness 07/18/17   Carollee Herter, Yvonne R, DO  pantoprazole (PROTONIX) 40 MG tablet Take 1 tablet (40 mg total) by mouth 2 (two) times daily. 05/20/18   Zehr, Janett Billow D, PA-C  polyethylene glycol powder (MIRALAX) 17 GM/SCOOP powder Take 17 g by mouth daily as needed. 07/27/19   Ann Held, DO  SANTYL ointment Apply 1 application topically daily. 12/18/17   [provider]  senna-docusate (SENOKOT-S) 8.6-50 MG per tablet Take 1 tablet  by mouth at bedtime.     [provider]  simethicone (MYLICON) 0000000 MG chewable tablet Chew 125 mg by mouth 3 (three) times daily after meals.    [provider]  traMADol (ULTRAM) 50 MG tablet Take 50 mg by mouth every 6 (six) hours as needed.  09/27/16   [provider]    Allergies    Patient has no known allergies.  Review of Systems   Review of Systems  Constitutional: Negative for fever.  HENT: Negative for congestion.   Eyes: Negative for visual disturbance.  Respiratory: Negative for shortness of breath.   Cardiovascular: Negative for chest pain.  Gastrointestinal: Positive for constipation. Negative for abdominal pain, diarrhea and flatus.  Genitourinary: Negative for difficulty urinating.  Musculoskeletal: Negative for arthralgias.  Neurological: Negative for weakness.  Psychiatric/Behavioral: Negative for agitation.  All other systems reviewed and are negative.   Physical Exam Updated Vital Signs BP 117/70   Pulse 90   Temp 97.6 F (36.4 C) (Oral)   Resp 17   SpO2 98%   Physical Exam Vitals and nursing note reviewed.  Constitutional:      General: She is not in acute distress.    Appearance: Normal appearance.  HENT:     Head: Normocephalic and atraumatic.     Nose: Nose normal.  Eyes:      Conjunctiva/sclera: Conjunctivae normal.     Pupils: Pupils are equal, round, and reactive to light.  Cardiovascular:     Rate and Rhythm: Normal rate and regular rhythm.     Pulses: Normal pulses.     Heart sounds: Normal heart sounds.  Pulmonary:     Effort: Pulmonary effort is normal.     Breath sounds: Normal breath sounds.  Abdominal:     General: Abdomen is flat. Bowel sounds are normal.     Tenderness: There is no abdominal tenderness. There is no guarding or rebound.  Musculoskeletal:        General: Normal range of motion.     Cervical back: Normal range of motion and neck supple.  Skin:    General: Skin is warm and dry.     Capillary Refill: Capillary refill takes less than 2 seconds.  Neurological:     General: No focal deficit present.     Mental Status: She is alert.     Deep Tendon Reflexes: Reflexes normal.  Psychiatric:        Mood and Affect: Mood normal.        Behavior: Behavior normal.     ED Results / Procedures / Treatments   Labs (all labs ordered are listed, but only abnormal results are displayed) Results for orders placed or performed during the hospital encounter of 10/16/19  CBC with Differential/Platelet  Result Value Ref Range   WBC 10.5 4.0 - 10.5 K/uL   RBC 4.74 3.87 - 5.11 MIL/uL   Hemoglobin 12.6 12.0 - 15.0 g/dL   HCT 42.4 36.0 - 46.0 %   MCV 89.5 80.0 - 100.0 fL   MCH 26.6 26.0 - 34.0 pg   MCHC 29.7 (L) 30.0 - 36.0 g/dL   RDW 15.8 (H) 11.5 - 15.5 %   Platelets 270 150 - 400 K/uL   nRBC 0.0 0.0 - 0.2 %   Neutrophils Relative % 66 %   Neutro Abs 6.8 1.7 - 7.7 K/uL   Lymphocytes Relative 28 %   Lymphs Abs 2.9 0.7 - 4.0 K/uL   Monocytes Relative 6 %  Monocytes Absolute 0.7 0.1 - 1.0 K/uL   Eosinophils Relative 0 %   Eosinophils Absolute 0.0 0.0 - 0.5 K/uL   Basophils Relative 0 %   Basophils Absolute 0.0 0.0 - 0.1 K/uL   Immature Granulocytes 0 %   Abs Immature Granulocytes 0.04 0.00 - 0.07 K/uL  I-stat chem 8, ED (not at  [facility] or [facility])  Result Value Ref Range   Sodium 135 135 - 145 mmol/L   Potassium 4.8 3.5 - 5.1 mmol/L   Chloride 96 (L) 98 - 111 mmol/L   BUN 25 (H) 8 - 23 mg/dL   Creatinine, Ser 0.40 (L) 0.44 - 1.00 mg/dL   Glucose, Bld 99 70 - 99 mg/dL   Calcium, Ion 1.11 (L) 1.15 - 1.40 mmol/L   TCO2 36 (H) 22 - 32 mmol/L   Hemoglobin 13.6 12.0 - 15.0 g/dL   HCT 40.0 36.0 - 46.0 %   DG Abd 1 View  Result Date: 10/16/2019 CLINICAL DATA:  86 year old female with constipation and nausea. EXAM: ABDOMEN - 1 VIEW COMPARISON:  Abdominal radiograph dated 02/05/2018. FINDINGS: There is moderate amount of stool throughout the colon. There is no bowel dilatation or evidence of obstruction. No free air or radiopaque calculi. Calcified uterine fibroids. There is degenerative changes of the spine and scoliosis. Lower thoracic spinal fusion as well as bilateral hip arthroplasty. Foley catheter. IMPRESSION: Constipation. No bowel obstruction. Electronically Signed   By: Anner Crete M.D.   On: 10/16/2019 20:02   CT ABDOMEN PELVIS W CONTRAST  Result Date: 10/17/2019 CLINICAL DATA:  86 year old female with history of constipation. EXAM: CT ABDOMEN AND PELVIS WITH CONTRAST TECHNIQUE: Multidetector CT imaging of the abdomen and pelvis was performed using the standard protocol following bolus administration of intravenous contrast. CONTRAST:  139mL OMNIPAQUE IOHEXOL 300 MG/ML  SOLN COMPARISON:  CT the abdomen and pelvis 05/12/2017. FINDINGS: Lower chest: Areas of scarring and/or subsegmental atelectasis in the dependent portion of the right lower lobe. Moderate to large hiatal hernia. Atherosclerotic calcifications in the thoracic aorta as well as the left main, left anterior descending, left circumflex and right coronary arteries. Calcifications of the aortic valve and mitral annulus. Hepatobiliary: Subcentimeter low-attenuation lesion in segment 2 of the liver, too small to characterize, but statistically likely to represent a  cyst. No other suspicious hepatic lesions. No intra or extrahepatic biliary ductal dilatation. Gallbladder is normal in appearance. Pancreas: No pancreatic mass. No pancreatic ductal dilatation. No pancreatic or peripancreatic fluid collections or inflammatory changes. Spleen: Unremarkable. Adrenals/Urinary Tract: Low-attenuation lesions in both kidneys, compatible with simple cysts, largest of which measures 4.7 cm in the interpolar region of the right kidney. No hydroureteronephrosis. Urinary bladder is nearly decompressed with an indwelling catheter and small amount of gas (presumably iatrogenic). Bilateral adrenal glands are normal in appearance. Stomach/Bowel: Normal appearance of the intra-abdominal portion of the stomach. No pathologic dilatation of small bowel or colon. Numerous colonic diverticulae are noted, without definite surrounding inflammatory changes to suggest an acute diverticulitis at this time. Large burden of well-formed stool throughout the colon compatible with reported clinical history of constipation. Normal appendix. Vascular/Lymphatic: Aortic atherosclerosis with fusiform ectasia of the infrarenal abdominal aorta which measures up to 2.5 x 2.3 cm. No lymphadenopathy noted in the abdomen or pelvis. Reproductive: Uterus is enlarged and heterogeneous in appearance with multiple densely calcified lesions, compatible with fibroids, largest of which measures 4.6 cm in the fundus. Ovaries are unremarkable in appearance. Other: No significant volume of ascites.  No pneumoperitoneum. Musculoskeletal:  Status post bilateral hip arthroplasty. Posterior rod and screw fixation device in place at T11-T12. There are no aggressive appearing lytic or blastic lesions noted in the visualized portions of the skeleton. IMPRESSION: 1. No acute findings are noted in the abdomen or pelvis. 2. However, there is a large stool burden throughout the colon, compatible with reported clinical history of constipation. 3.  Fibroid uterus. 4. Aortic atherosclerosis, in addition to left main and 3 vessel coronary artery disease. 5. There are calcifications of the aortic valve and mitral annulus. Echocardiographic correlation for evaluation of potential valvular dysfunction may be warranted if clinically indicated. 6. Additional incidental findings, as above. Electronically Signed   By: Vinnie Langton M.D.   On: 10/17/2019 06:33    Radiology DG Abd 1 View  Result Date: 10/16/2019 CLINICAL DATA:  86 year old female with constipation and nausea. EXAM: ABDOMEN - 1 VIEW COMPARISON:  Abdominal radiograph dated 02/05/2018. FINDINGS: There is moderate amount of stool throughout the colon. There is no bowel dilatation or evidence of obstruction. No free air or radiopaque calculi. Calcified uterine fibroids. There is degenerative changes of the spine and scoliosis. Lower thoracic spinal fusion as well as bilateral hip arthroplasty. Foley catheter. IMPRESSION: Constipation. No bowel obstruction. Electronically Signed   By: Anner Crete M.D.   On: 10/16/2019 20:02   CT ABDOMEN PELVIS W CONTRAST  Result Date: 10/17/2019 CLINICAL DATA:  86 year old female with history of constipation. EXAM: CT ABDOMEN AND PELVIS WITH CONTRAST TECHNIQUE: Multidetector CT imaging of the abdomen and pelvis was performed using the standard protocol following bolus administration of intravenous contrast. CONTRAST:  147mL OMNIPAQUE IOHEXOL 300 MG/ML  SOLN COMPARISON:  CT the abdomen and pelvis 05/12/2017. FINDINGS: Lower chest: Areas of scarring and/or subsegmental atelectasis in the dependent portion of the right lower lobe. Moderate to large hiatal hernia. Atherosclerotic calcifications in the thoracic aorta as well as the left main, left anterior descending, left circumflex and right coronary arteries. Calcifications of the aortic valve and mitral annulus. Hepatobiliary: Subcentimeter low-attenuation lesion in segment 2 of the liver, too small to  characterize, but statistically likely to represent a cyst. No other suspicious hepatic lesions. No intra or extrahepatic biliary ductal dilatation. Gallbladder is normal in appearance. Pancreas: No pancreatic mass. No pancreatic ductal dilatation. No pancreatic or peripancreatic fluid collections or inflammatory changes. Spleen: Unremarkable. Adrenals/Urinary Tract: Low-attenuation lesions in both kidneys, compatible with simple cysts, largest of which measures 4.7 cm in the interpolar region of the right kidney. No hydroureteronephrosis. Urinary bladder is nearly decompressed with an indwelling catheter and small amount of gas (presumably iatrogenic). Bilateral adrenal glands are normal in appearance. Stomach/Bowel: Normal appearance of the intra-abdominal portion of the stomach. No pathologic dilatation of small bowel or colon. Numerous colonic diverticulae are noted, without definite surrounding inflammatory changes to suggest an acute diverticulitis at this time. Large burden of well-formed stool throughout the colon compatible with reported clinical history of constipation. Normal appendix. Vascular/Lymphatic: Aortic atherosclerosis with fusiform ectasia of the infrarenal abdominal aorta which measures up to 2.5 x 2.3 cm. No lymphadenopathy noted in the abdomen or pelvis. Reproductive: Uterus is enlarged and heterogeneous in appearance with multiple densely calcified lesions, compatible with fibroids, largest of which measures 4.6 cm in the fundus. Ovaries are unremarkable in appearance. Other: No significant volume of ascites.  No pneumoperitoneum. Musculoskeletal: Status post bilateral hip arthroplasty. Posterior rod and screw fixation device in place at T11-T12. There are no aggressive appearing lytic or blastic lesions noted in the visualized portions of  the skeleton. IMPRESSION: 1. No acute findings are noted in the abdomen or pelvis. 2. However, there is a large stool burden throughout the colon,  compatible with reported clinical history of constipation. 3. Fibroid uterus. 4. Aortic atherosclerosis, in addition to left main and 3 vessel coronary artery disease. 5. There are calcifications of the aortic valve and mitral annulus. Echocardiographic correlation for evaluation of potential valvular dysfunction may be warranted if clinically indicated. 6. Additional incidental findings, as above. Electronically Signed   By: Vinnie Langton M.D.   On: 10/17/2019 06:33    Procedures Procedures (including critical care time)  Medications Ordered in ED Medications  polyethylene glycol (MIRALAX / GLYCOLAX) packet 17 g (has no administration in time range)  milk and molasses enema (has no administration in time range)  iohexol (OMNIPAQUE) 300 MG/ML solution 100 mL (100 mLs Intravenous Contrast Given 10/17/19 0520)    ED Course  I have reviewed the triage vital signs and the nursing notes.  Pertinent labs & imaging results that were available during my care of the patient were reviewed by me and considered in my medical decision making (see chart for details).    Milke and molasses enema ordered.   Final Clinical Impression(s) / ED Diagnoses Final diagnoses:  Constipation  Constipation due to opioid therapy    Signed out to Dr. Gilford Raid pending enema    Randal Buba, Ceniyah Thorp, MD 10/17/19 [phone]

## 2019-10-17 NOTE — ED Notes (Signed)
Patients son was called to let him no she had been moved to a room [address].he is on his way back

## 2019-11-03 DIAGNOSIS — M069 Rheumatoid arthritis, unspecified: Secondary | ICD-10-CM | POA: Diagnosis not present

## 2019-11-03 DIAGNOSIS — M25511 Pain in right shoulder: Secondary | ICD-10-CM | POA: Diagnosis not present

## 2019-11-03 DIAGNOSIS — M5412 Radiculopathy, cervical region: Secondary | ICD-10-CM | POA: Diagnosis not present

## 2019-11-03 DIAGNOSIS — G8929 Other chronic pain: Secondary | ICD-10-CM | POA: Diagnosis not present

## 2019-11-03 DIAGNOSIS — M25512 Pain in left shoulder: Secondary | ICD-10-CM | POA: Diagnosis not present

## 2019-11-03 DIAGNOSIS — M961 Postlaminectomy syndrome, not elsewhere classified: Secondary | ICD-10-CM | POA: Diagnosis not present

## 2019-11-03 DIAGNOSIS — M21969 Unspecified acquired deformity of unspecified lower leg: Secondary | ICD-10-CM | POA: Diagnosis not present

## 2019-11-16 DIAGNOSIS — Z87891 Personal history of nicotine dependence: Secondary | ICD-10-CM | POA: Diagnosis not present

## 2019-11-16 DIAGNOSIS — Z993 Dependence on wheelchair: Secondary | ICD-10-CM | POA: Diagnosis not present

## 2019-11-16 DIAGNOSIS — Z9181 History of falling: Secondary | ICD-10-CM | POA: Diagnosis not present

## 2019-11-16 DIAGNOSIS — H269 Unspecified cataract: Secondary | ICD-10-CM | POA: Diagnosis not present

## 2019-11-16 DIAGNOSIS — K219 Gastro-esophageal reflux disease without esophagitis: Secondary | ICD-10-CM | POA: Diagnosis not present

## 2019-11-16 DIAGNOSIS — I10 Essential (primary) hypertension: Secondary | ICD-10-CM | POA: Diagnosis not present

## 2019-11-16 DIAGNOSIS — K5901 Slow transit constipation: Secondary | ICD-10-CM | POA: Diagnosis not present

## 2019-11-16 DIAGNOSIS — Z79891 Long term (current) use of opiate analgesic: Secondary | ICD-10-CM | POA: Diagnosis not present

## 2019-11-16 DIAGNOSIS — M8949 Other hypertrophic osteoarthropathy, multiple sites: Secondary | ICD-10-CM | POA: Diagnosis not present

## 2019-11-16 DIAGNOSIS — Z7901 Long term (current) use of anticoagulants: Secondary | ICD-10-CM | POA: Diagnosis not present

## 2019-11-16 DIAGNOSIS — M792 Neuralgia and neuritis, unspecified: Secondary | ICD-10-CM | POA: Diagnosis not present

## 2019-11-16 DIAGNOSIS — G894 Chronic pain syndrome: Secondary | ICD-10-CM | POA: Diagnosis not present

## 2019-11-16 DIAGNOSIS — M48061 Spinal stenosis, lumbar region without neurogenic claudication: Secondary | ICD-10-CM | POA: Diagnosis not present

## 2019-11-16 DIAGNOSIS — Z466 Encounter for fitting and adjustment of urinary device: Secondary | ICD-10-CM | POA: Diagnosis not present

## 2019-11-16 DIAGNOSIS — E785 Hyperlipidemia, unspecified: Secondary | ICD-10-CM | POA: Diagnosis not present

## 2019-11-22 DIAGNOSIS — R0902 Hypoxemia: Secondary | ICD-10-CM | POA: Diagnosis not present

## 2019-11-22 DIAGNOSIS — T17920A Food in respiratory tract, part unspecified causing asphyxiation, initial encounter: Secondary | ICD-10-CM | POA: Diagnosis not present

## 2019-11-22 DIAGNOSIS — R918 Other nonspecific abnormal finding of lung field: Secondary | ICD-10-CM | POA: Diagnosis not present

## 2019-11-22 DIAGNOSIS — R05 Cough: Secondary | ICD-10-CM | POA: Diagnosis not present

## 2019-11-22 DIAGNOSIS — R4182 Altered mental status, unspecified: Secondary | ICD-10-CM | POA: Diagnosis not present

## 2019-11-23 DIAGNOSIS — I272 Pulmonary hypertension, unspecified: Secondary | ICD-10-CM | POA: Diagnosis not present

## 2019-11-23 DIAGNOSIS — I517 Cardiomegaly: Secondary | ICD-10-CM | POA: Diagnosis not present

## 2019-11-23 DIAGNOSIS — I361 Nonrheumatic tricuspid (valve) insufficiency: Secondary | ICD-10-CM | POA: Diagnosis not present

## 2019-11-23 DIAGNOSIS — R1310 Dysphagia, unspecified: Secondary | ICD-10-CM | POA: Diagnosis not present

## 2019-11-26 DIAGNOSIS — G894 Chronic pain syndrome: Secondary | ICD-10-CM | POA: Diagnosis not present

## 2019-11-26 DIAGNOSIS — E785 Hyperlipidemia, unspecified: Secondary | ICD-10-CM | POA: Diagnosis not present

## 2019-11-26 DIAGNOSIS — M792 Neuralgia and neuritis, unspecified: Secondary | ICD-10-CM | POA: Diagnosis not present

## 2019-11-26 DIAGNOSIS — M8949 Other hypertrophic osteoarthropathy, multiple sites: Secondary | ICD-10-CM | POA: Diagnosis not present

## 2019-11-26 DIAGNOSIS — I10 Essential (primary) hypertension: Secondary | ICD-10-CM | POA: Diagnosis not present

## 2019-11-26 DIAGNOSIS — M48061 Spinal stenosis, lumbar region without neurogenic claudication: Secondary | ICD-10-CM | POA: Diagnosis not present

## 2019-12-01 DIAGNOSIS — E785 Hyperlipidemia, unspecified: Secondary | ICD-10-CM | POA: Diagnosis not present

## 2019-12-01 DIAGNOSIS — M792 Neuralgia and neuritis, unspecified: Secondary | ICD-10-CM | POA: Diagnosis not present

## 2019-12-01 DIAGNOSIS — G894 Chronic pain syndrome: Secondary | ICD-10-CM | POA: Diagnosis not present

## 2019-12-01 DIAGNOSIS — I10 Essential (primary) hypertension: Secondary | ICD-10-CM | POA: Diagnosis not present

## 2019-12-01 DIAGNOSIS — M8949 Other hypertrophic osteoarthropathy, multiple sites: Secondary | ICD-10-CM | POA: Diagnosis not present

## 2019-12-01 DIAGNOSIS — M48061 Spinal stenosis, lumbar region without neurogenic claudication: Secondary | ICD-10-CM | POA: Diagnosis not present

## 2019-12-03 DIAGNOSIS — M792 Neuralgia and neuritis, unspecified: Secondary | ICD-10-CM | POA: Diagnosis not present

## 2019-12-03 DIAGNOSIS — M48061 Spinal stenosis, lumbar region without neurogenic claudication: Secondary | ICD-10-CM | POA: Diagnosis not present

## 2019-12-03 DIAGNOSIS — G894 Chronic pain syndrome: Secondary | ICD-10-CM | POA: Diagnosis not present

## 2019-12-03 DIAGNOSIS — M8949 Other hypertrophic osteoarthropathy, multiple sites: Secondary | ICD-10-CM | POA: Diagnosis not present

## 2019-12-03 DIAGNOSIS — I10 Essential (primary) hypertension: Secondary | ICD-10-CM | POA: Diagnosis not present

## 2019-12-03 DIAGNOSIS — E785 Hyperlipidemia, unspecified: Secondary | ICD-10-CM | POA: Diagnosis not present

## 2019-12-08 DIAGNOSIS — G894 Chronic pain syndrome: Secondary | ICD-10-CM | POA: Diagnosis not present

## 2019-12-08 DIAGNOSIS — M48061 Spinal stenosis, lumbar region without neurogenic claudication: Secondary | ICD-10-CM | POA: Diagnosis not present

## 2019-12-08 DIAGNOSIS — M792 Neuralgia and neuritis, unspecified: Secondary | ICD-10-CM | POA: Diagnosis not present

## 2019-12-08 DIAGNOSIS — M8949 Other hypertrophic osteoarthropathy, multiple sites: Secondary | ICD-10-CM | POA: Diagnosis not present

## 2019-12-08 DIAGNOSIS — E785 Hyperlipidemia, unspecified: Secondary | ICD-10-CM | POA: Diagnosis not present

## 2019-12-08 DIAGNOSIS — I10 Essential (primary) hypertension: Secondary | ICD-10-CM | POA: Diagnosis not present

## 2019-12-13 DIAGNOSIS — M792 Neuralgia and neuritis, unspecified: Secondary | ICD-10-CM | POA: Diagnosis not present

## 2019-12-13 DIAGNOSIS — G894 Chronic pain syndrome: Secondary | ICD-10-CM | POA: Diagnosis not present

## 2019-12-13 DIAGNOSIS — M8949 Other hypertrophic osteoarthropathy, multiple sites: Secondary | ICD-10-CM | POA: Diagnosis not present

## 2019-12-13 DIAGNOSIS — E785 Hyperlipidemia, unspecified: Secondary | ICD-10-CM | POA: Diagnosis not present

## 2019-12-13 DIAGNOSIS — I10 Essential (primary) hypertension: Secondary | ICD-10-CM | POA: Diagnosis not present

## 2019-12-13 DIAGNOSIS — M48061 Spinal stenosis, lumbar region without neurogenic claudication: Secondary | ICD-10-CM | POA: Diagnosis not present

## 2019-12-16 DIAGNOSIS — R1310 Dysphagia, unspecified: Secondary | ICD-10-CM | POA: Diagnosis not present

## 2019-12-16 DIAGNOSIS — I10 Essential (primary) hypertension: Secondary | ICD-10-CM | POA: Diagnosis not present

## 2019-12-16 DIAGNOSIS — Z791 Long term (current) use of non-steroidal anti-inflammatories (NSAID): Secondary | ICD-10-CM | POA: Diagnosis not present

## 2019-12-16 DIAGNOSIS — Z79891 Long term (current) use of opiate analgesic: Secondary | ICD-10-CM | POA: Diagnosis not present

## 2019-12-16 DIAGNOSIS — H269 Unspecified cataract: Secondary | ICD-10-CM | POA: Diagnosis not present

## 2019-12-16 DIAGNOSIS — G894 Chronic pain syndrome: Secondary | ICD-10-CM | POA: Diagnosis not present

## 2019-12-16 DIAGNOSIS — I739 Peripheral vascular disease, unspecified: Secondary | ICD-10-CM | POA: Diagnosis not present

## 2019-12-16 DIAGNOSIS — K5901 Slow transit constipation: Secondary | ICD-10-CM | POA: Diagnosis not present

## 2019-12-16 DIAGNOSIS — G8220 Paraplegia, unspecified: Secondary | ICD-10-CM | POA: Diagnosis not present

## 2019-12-16 DIAGNOSIS — Z7901 Long term (current) use of anticoagulants: Secondary | ICD-10-CM | POA: Diagnosis not present

## 2019-12-16 DIAGNOSIS — L89892 Pressure ulcer of other site, stage 2: Secondary | ICD-10-CM | POA: Diagnosis not present

## 2019-12-16 DIAGNOSIS — Z466 Encounter for fitting and adjustment of urinary device: Secondary | ICD-10-CM | POA: Diagnosis not present

## 2019-12-16 DIAGNOSIS — Z87891 Personal history of nicotine dependence: Secondary | ICD-10-CM | POA: Diagnosis not present

## 2019-12-16 DIAGNOSIS — Z9181 History of falling: Secondary | ICD-10-CM | POA: Diagnosis not present

## 2019-12-16 DIAGNOSIS — Z9981 Dependence on supplemental oxygen: Secondary | ICD-10-CM | POA: Diagnosis not present

## 2019-12-16 DIAGNOSIS — Z86718 Personal history of other venous thrombosis and embolism: Secondary | ICD-10-CM | POA: Diagnosis not present

## 2019-12-16 DIAGNOSIS — M48061 Spinal stenosis, lumbar region without neurogenic claudication: Secondary | ICD-10-CM | POA: Diagnosis not present

## 2019-12-16 DIAGNOSIS — M792 Neuralgia and neuritis, unspecified: Secondary | ICD-10-CM | POA: Diagnosis not present

## 2019-12-16 DIAGNOSIS — E785 Hyperlipidemia, unspecified: Secondary | ICD-10-CM | POA: Diagnosis not present

## 2019-12-16 DIAGNOSIS — K219 Gastro-esophageal reflux disease without esophagitis: Secondary | ICD-10-CM | POA: Diagnosis not present

## 2019-12-16 DIAGNOSIS — Z993 Dependence on wheelchair: Secondary | ICD-10-CM | POA: Diagnosis not present

## 2019-12-16 DIAGNOSIS — H9190 Unspecified hearing loss, unspecified ear: Secondary | ICD-10-CM | POA: Diagnosis not present

## 2019-12-16 DIAGNOSIS — M8949 Other hypertrophic osteoarthropathy, multiple sites: Secondary | ICD-10-CM | POA: Diagnosis not present

## 2019-12-23 DIAGNOSIS — N390 Urinary tract infection, site not specified: Secondary | ICD-10-CM | POA: Diagnosis not present

## 2019-12-23 DIAGNOSIS — I10 Essential (primary) hypertension: Secondary | ICD-10-CM | POA: Diagnosis not present

## 2019-12-23 DIAGNOSIS — N898 Other specified noninflammatory disorders of vagina: Secondary | ICD-10-CM | POA: Diagnosis not present

## 2019-12-23 DIAGNOSIS — M48061 Spinal stenosis, lumbar region without neurogenic claudication: Secondary | ICD-10-CM | POA: Diagnosis not present

## 2019-12-23 DIAGNOSIS — I739 Peripheral vascular disease, unspecified: Secondary | ICD-10-CM | POA: Diagnosis not present

## 2019-12-23 DIAGNOSIS — L89892 Pressure ulcer of other site, stage 2: Secondary | ICD-10-CM | POA: Diagnosis not present

## 2019-12-23 DIAGNOSIS — G8220 Paraplegia, unspecified: Secondary | ICD-10-CM | POA: Diagnosis not present

## 2019-12-23 DIAGNOSIS — R1310 Dysphagia, unspecified: Secondary | ICD-10-CM | POA: Diagnosis not present

## 2019-12-24 DIAGNOSIS — R5381 Other malaise: Secondary | ICD-10-CM | POA: Diagnosis not present

## 2019-12-24 DIAGNOSIS — Z7401 Bed confinement status: Secondary | ICD-10-CM | POA: Diagnosis not present

## 2019-12-24 DIAGNOSIS — M2550 Pain in unspecified joint: Secondary | ICD-10-CM | POA: Diagnosis not present

## 2019-12-30 DIAGNOSIS — M48061 Spinal stenosis, lumbar region without neurogenic claudication: Secondary | ICD-10-CM | POA: Diagnosis not present

## 2019-12-30 DIAGNOSIS — R1310 Dysphagia, unspecified: Secondary | ICD-10-CM | POA: Diagnosis not present

## 2019-12-30 DIAGNOSIS — I10 Essential (primary) hypertension: Secondary | ICD-10-CM | POA: Diagnosis not present

## 2019-12-30 DIAGNOSIS — G8220 Paraplegia, unspecified: Secondary | ICD-10-CM | POA: Diagnosis not present

## 2019-12-30 DIAGNOSIS — L89892 Pressure ulcer of other site, stage 2: Secondary | ICD-10-CM | POA: Diagnosis not present

## 2019-12-30 DIAGNOSIS — I739 Peripheral vascular disease, unspecified: Secondary | ICD-10-CM | POA: Diagnosis not present

## 2020-01-04 DIAGNOSIS — N390 Urinary tract infection, site not specified: Secondary | ICD-10-CM | POA: Diagnosis not present

## 2020-01-04 DIAGNOSIS — I10 Essential (primary) hypertension: Secondary | ICD-10-CM | POA: Diagnosis not present

## 2020-01-04 DIAGNOSIS — I739 Peripheral vascular disease, unspecified: Secondary | ICD-10-CM | POA: Diagnosis not present

## 2020-01-04 DIAGNOSIS — M48061 Spinal stenosis, lumbar region without neurogenic claudication: Secondary | ICD-10-CM | POA: Diagnosis not present

## 2020-01-04 DIAGNOSIS — G8220 Paraplegia, unspecified: Secondary | ICD-10-CM | POA: Diagnosis not present

## 2020-01-04 DIAGNOSIS — R1310 Dysphagia, unspecified: Secondary | ICD-10-CM | POA: Diagnosis not present

## 2020-01-04 DIAGNOSIS — L89892 Pressure ulcer of other site, stage 2: Secondary | ICD-10-CM | POA: Diagnosis not present

## 2020-01-12 DIAGNOSIS — M48061 Spinal stenosis, lumbar region without neurogenic claudication: Secondary | ICD-10-CM | POA: Diagnosis not present

## 2020-01-12 DIAGNOSIS — G8220 Paraplegia, unspecified: Secondary | ICD-10-CM | POA: Diagnosis not present

## 2020-01-12 DIAGNOSIS — I10 Essential (primary) hypertension: Secondary | ICD-10-CM | POA: Diagnosis not present

## 2020-01-12 DIAGNOSIS — L89892 Pressure ulcer of other site, stage 2: Secondary | ICD-10-CM | POA: Diagnosis not present

## 2020-01-12 DIAGNOSIS — R1310 Dysphagia, unspecified: Secondary | ICD-10-CM | POA: Diagnosis not present

## 2020-01-12 DIAGNOSIS — I739 Peripheral vascular disease, unspecified: Secondary | ICD-10-CM | POA: Diagnosis not present

## 2020-01-14 DIAGNOSIS — D259 Leiomyoma of uterus, unspecified: Secondary | ICD-10-CM | POA: Diagnosis not present

## 2020-01-14 DIAGNOSIS — R5381 Other malaise: Secondary | ICD-10-CM | POA: Diagnosis not present

## 2020-01-14 DIAGNOSIS — Z7401 Bed confinement status: Secondary | ICD-10-CM | POA: Diagnosis not present

## 2020-01-14 DIAGNOSIS — R0902 Hypoxemia: Secondary | ICD-10-CM | POA: Diagnosis not present

## 2020-01-14 DIAGNOSIS — M2550 Pain in unspecified joint: Secondary | ICD-10-CM | POA: Diagnosis not present

## 2020-01-14 DIAGNOSIS — N898 Other specified noninflammatory disorders of vagina: Secondary | ICD-10-CM | POA: Diagnosis not present

## 2020-01-14 DIAGNOSIS — R300 Dysuria: Secondary | ICD-10-CM | POA: Diagnosis not present

## 2020-01-14 DIAGNOSIS — R110 Nausea: Secondary | ICD-10-CM | POA: Diagnosis not present

## 2020-01-15 DIAGNOSIS — Z9181 History of falling: Secondary | ICD-10-CM | POA: Diagnosis not present

## 2020-01-15 DIAGNOSIS — L89892 Pressure ulcer of other site, stage 2: Secondary | ICD-10-CM | POA: Diagnosis not present

## 2020-01-15 DIAGNOSIS — Z86718 Personal history of other venous thrombosis and embolism: Secondary | ICD-10-CM | POA: Diagnosis not present

## 2020-01-15 DIAGNOSIS — I10 Essential (primary) hypertension: Secondary | ICD-10-CM | POA: Diagnosis not present

## 2020-01-15 DIAGNOSIS — K219 Gastro-esophageal reflux disease without esophagitis: Secondary | ICD-10-CM | POA: Diagnosis not present

## 2020-01-15 DIAGNOSIS — E785 Hyperlipidemia, unspecified: Secondary | ICD-10-CM | POA: Diagnosis not present

## 2020-01-15 DIAGNOSIS — R1310 Dysphagia, unspecified: Secondary | ICD-10-CM | POA: Diagnosis not present

## 2020-01-15 DIAGNOSIS — Z9981 Dependence on supplemental oxygen: Secondary | ICD-10-CM | POA: Diagnosis not present

## 2020-01-15 DIAGNOSIS — Z79891 Long term (current) use of opiate analgesic: Secondary | ICD-10-CM | POA: Diagnosis not present

## 2020-01-15 DIAGNOSIS — I739 Peripheral vascular disease, unspecified: Secondary | ICD-10-CM | POA: Diagnosis not present

## 2020-01-15 DIAGNOSIS — G8220 Paraplegia, unspecified: Secondary | ICD-10-CM | POA: Diagnosis not present

## 2020-01-15 DIAGNOSIS — Z87891 Personal history of nicotine dependence: Secondary | ICD-10-CM | POA: Diagnosis not present

## 2020-01-15 DIAGNOSIS — Z791 Long term (current) use of non-steroidal anti-inflammatories (NSAID): Secondary | ICD-10-CM | POA: Diagnosis not present

## 2020-01-15 DIAGNOSIS — M8949 Other hypertrophic osteoarthropathy, multiple sites: Secondary | ICD-10-CM | POA: Diagnosis not present

## 2020-01-15 DIAGNOSIS — M792 Neuralgia and neuritis, unspecified: Secondary | ICD-10-CM | POA: Diagnosis not present

## 2020-01-15 DIAGNOSIS — Z993 Dependence on wheelchair: Secondary | ICD-10-CM | POA: Diagnosis not present

## 2020-01-15 DIAGNOSIS — K5901 Slow transit constipation: Secondary | ICD-10-CM | POA: Diagnosis not present

## 2020-01-15 DIAGNOSIS — M48061 Spinal stenosis, lumbar region without neurogenic claudication: Secondary | ICD-10-CM | POA: Diagnosis not present

## 2020-01-15 DIAGNOSIS — H269 Unspecified cataract: Secondary | ICD-10-CM | POA: Diagnosis not present

## 2020-01-15 DIAGNOSIS — G894 Chronic pain syndrome: Secondary | ICD-10-CM | POA: Diagnosis not present

## 2020-01-15 DIAGNOSIS — H9190 Unspecified hearing loss, unspecified ear: Secondary | ICD-10-CM | POA: Diagnosis not present

## 2020-01-15 DIAGNOSIS — Z7901 Long term (current) use of anticoagulants: Secondary | ICD-10-CM | POA: Diagnosis not present

## 2020-01-15 DIAGNOSIS — Z466 Encounter for fitting and adjustment of urinary device: Secondary | ICD-10-CM | POA: Diagnosis not present

## 2020-01-18 DIAGNOSIS — M21969 Unspecified acquired deformity of unspecified lower leg: Secondary | ICD-10-CM | POA: Diagnosis not present

## 2020-01-18 DIAGNOSIS — M5412 Radiculopathy, cervical region: Secondary | ICD-10-CM | POA: Diagnosis not present

## 2020-01-18 DIAGNOSIS — G8929 Other chronic pain: Secondary | ICD-10-CM | POA: Diagnosis not present

## 2020-01-18 DIAGNOSIS — M961 Postlaminectomy syndrome, not elsewhere classified: Secondary | ICD-10-CM | POA: Diagnosis not present

## 2020-01-18 DIAGNOSIS — M25512 Pain in left shoulder: Secondary | ICD-10-CM | POA: Diagnosis not present

## 2020-01-18 DIAGNOSIS — M25511 Pain in right shoulder: Secondary | ICD-10-CM | POA: Diagnosis not present

## 2020-01-18 DIAGNOSIS — M069 Rheumatoid arthritis, unspecified: Secondary | ICD-10-CM | POA: Diagnosis not present

## 2020-01-19 DIAGNOSIS — G8220 Paraplegia, unspecified: Secondary | ICD-10-CM | POA: Diagnosis not present

## 2020-01-19 DIAGNOSIS — R1310 Dysphagia, unspecified: Secondary | ICD-10-CM | POA: Diagnosis not present

## 2020-01-19 DIAGNOSIS — I10 Essential (primary) hypertension: Secondary | ICD-10-CM | POA: Diagnosis not present

## 2020-01-19 DIAGNOSIS — M48061 Spinal stenosis, lumbar region without neurogenic claudication: Secondary | ICD-10-CM | POA: Diagnosis not present

## 2020-01-19 DIAGNOSIS — I739 Peripheral vascular disease, unspecified: Secondary | ICD-10-CM | POA: Diagnosis not present

## 2020-01-19 DIAGNOSIS — L89892 Pressure ulcer of other site, stage 2: Secondary | ICD-10-CM | POA: Diagnosis not present

## 2020-01-27 DIAGNOSIS — M48061 Spinal stenosis, lumbar region without neurogenic claudication: Secondary | ICD-10-CM | POA: Diagnosis not present

## 2020-01-27 DIAGNOSIS — R1310 Dysphagia, unspecified: Secondary | ICD-10-CM | POA: Diagnosis not present

## 2020-01-27 DIAGNOSIS — I10 Essential (primary) hypertension: Secondary | ICD-10-CM | POA: Diagnosis not present

## 2020-01-27 DIAGNOSIS — I739 Peripheral vascular disease, unspecified: Secondary | ICD-10-CM | POA: Diagnosis not present

## 2020-01-27 DIAGNOSIS — N390 Urinary tract infection, site not specified: Secondary | ICD-10-CM | POA: Diagnosis not present

## 2020-01-27 DIAGNOSIS — L89892 Pressure ulcer of other site, stage 2: Secondary | ICD-10-CM | POA: Diagnosis not present

## 2020-01-27 DIAGNOSIS — G8220 Paraplegia, unspecified: Secondary | ICD-10-CM | POA: Diagnosis not present

## 2020-02-03 DIAGNOSIS — R1310 Dysphagia, unspecified: Secondary | ICD-10-CM | POA: Diagnosis not present

## 2020-02-03 DIAGNOSIS — I10 Essential (primary) hypertension: Secondary | ICD-10-CM | POA: Diagnosis not present

## 2020-02-03 DIAGNOSIS — G8220 Paraplegia, unspecified: Secondary | ICD-10-CM | POA: Diagnosis not present

## 2020-02-03 DIAGNOSIS — M48061 Spinal stenosis, lumbar region without neurogenic claudication: Secondary | ICD-10-CM | POA: Diagnosis not present

## 2020-02-03 DIAGNOSIS — I739 Peripheral vascular disease, unspecified: Secondary | ICD-10-CM | POA: Diagnosis not present

## 2020-02-03 DIAGNOSIS — L89892 Pressure ulcer of other site, stage 2: Secondary | ICD-10-CM | POA: Diagnosis not present

## 2020-02-08 DIAGNOSIS — L89892 Pressure ulcer of other site, stage 2: Secondary | ICD-10-CM | POA: Diagnosis not present

## 2020-02-08 DIAGNOSIS — M48061 Spinal stenosis, lumbar region without neurogenic claudication: Secondary | ICD-10-CM | POA: Diagnosis not present

## 2020-02-08 DIAGNOSIS — I739 Peripheral vascular disease, unspecified: Secondary | ICD-10-CM | POA: Diagnosis not present

## 2020-02-08 DIAGNOSIS — I10 Essential (primary) hypertension: Secondary | ICD-10-CM | POA: Diagnosis not present

## 2020-02-08 DIAGNOSIS — G8220 Paraplegia, unspecified: Secondary | ICD-10-CM | POA: Diagnosis not present

## 2020-02-08 DIAGNOSIS — R1310 Dysphagia, unspecified: Secondary | ICD-10-CM | POA: Diagnosis not present

## 2020-02-09 ENCOUNTER — Telehealth: Payer: Self-pay

## 2020-02-09 NOTE — Telephone Encounter (Signed)
Patient did not admit to [facility] today. Received referral for Palliative care. Verbal consent obtained from son, Cloria Spring. Visit scheduled with Enid Derry NP for 02/14/20 @ 2:30pm. Requesting social worker to reach out to son regarding resources.

## 2020-02-10 DIAGNOSIS — R1310 Dysphagia, unspecified: Secondary | ICD-10-CM | POA: Diagnosis not present

## 2020-02-10 DIAGNOSIS — M48061 Spinal stenosis, lumbar region without neurogenic claudication: Secondary | ICD-10-CM | POA: Diagnosis not present

## 2020-02-10 DIAGNOSIS — I739 Peripheral vascular disease, unspecified: Secondary | ICD-10-CM | POA: Diagnosis not present

## 2020-02-10 DIAGNOSIS — I10 Essential (primary) hypertension: Secondary | ICD-10-CM | POA: Diagnosis not present

## 2020-02-10 DIAGNOSIS — G8220 Paraplegia, unspecified: Secondary | ICD-10-CM | POA: Diagnosis not present

## 2020-02-10 DIAGNOSIS — L89892 Pressure ulcer of other site, stage 2: Secondary | ICD-10-CM | POA: Diagnosis not present

## 2020-02-14 ENCOUNTER — Other Ambulatory Visit: Payer: Medicare Other | Admitting: Internal Medicine

## 2020-02-14 ENCOUNTER — Other Ambulatory Visit: Payer: Self-pay

## 2020-02-14 ENCOUNTER — Telehealth: Payer: Self-pay

## 2020-02-14 DIAGNOSIS — K219 Gastro-esophageal reflux disease without esophagitis: Secondary | ICD-10-CM | POA: Diagnosis not present

## 2020-02-14 DIAGNOSIS — K5901 Slow transit constipation: Secondary | ICD-10-CM | POA: Diagnosis not present

## 2020-02-14 DIAGNOSIS — E785 Hyperlipidemia, unspecified: Secondary | ICD-10-CM | POA: Diagnosis not present

## 2020-02-14 DIAGNOSIS — Z9181 History of falling: Secondary | ICD-10-CM | POA: Diagnosis not present

## 2020-02-14 DIAGNOSIS — G8220 Paraplegia, unspecified: Secondary | ICD-10-CM | POA: Diagnosis not present

## 2020-02-14 DIAGNOSIS — M792 Neuralgia and neuritis, unspecified: Secondary | ICD-10-CM | POA: Diagnosis not present

## 2020-02-14 DIAGNOSIS — I10 Essential (primary) hypertension: Secondary | ICD-10-CM | POA: Diagnosis not present

## 2020-02-14 DIAGNOSIS — M8949 Other hypertrophic osteoarthropathy, multiple sites: Secondary | ICD-10-CM | POA: Diagnosis not present

## 2020-02-14 DIAGNOSIS — L89892 Pressure ulcer of other site, stage 2: Secondary | ICD-10-CM | POA: Diagnosis not present

## 2020-02-14 DIAGNOSIS — H269 Unspecified cataract: Secondary | ICD-10-CM | POA: Diagnosis not present

## 2020-02-14 DIAGNOSIS — Z7901 Long term (current) use of anticoagulants: Secondary | ICD-10-CM | POA: Diagnosis not present

## 2020-02-14 DIAGNOSIS — Z515 Encounter for palliative care: Secondary | ICD-10-CM

## 2020-02-14 DIAGNOSIS — Z993 Dependence on wheelchair: Secondary | ICD-10-CM | POA: Diagnosis not present

## 2020-02-14 DIAGNOSIS — H9190 Unspecified hearing loss, unspecified ear: Secondary | ICD-10-CM | POA: Diagnosis not present

## 2020-02-14 DIAGNOSIS — Z87891 Personal history of nicotine dependence: Secondary | ICD-10-CM | POA: Diagnosis not present

## 2020-02-14 DIAGNOSIS — M48061 Spinal stenosis, lumbar region without neurogenic claudication: Secondary | ICD-10-CM | POA: Diagnosis not present

## 2020-02-14 DIAGNOSIS — Z86718 Personal history of other venous thrombosis and embolism: Secondary | ICD-10-CM | POA: Diagnosis not present

## 2020-02-14 DIAGNOSIS — R1310 Dysphagia, unspecified: Secondary | ICD-10-CM | POA: Diagnosis not present

## 2020-02-14 DIAGNOSIS — G894 Chronic pain syndrome: Secondary | ICD-10-CM | POA: Diagnosis not present

## 2020-02-14 DIAGNOSIS — Z9981 Dependence on supplemental oxygen: Secondary | ICD-10-CM | POA: Diagnosis not present

## 2020-02-14 DIAGNOSIS — Z466 Encounter for fitting and adjustment of urinary device: Secondary | ICD-10-CM | POA: Diagnosis not present

## 2020-02-14 DIAGNOSIS — I739 Peripheral vascular disease, unspecified: Secondary | ICD-10-CM | POA: Diagnosis not present

## 2020-02-14 DIAGNOSIS — Z791 Long term (current) use of non-steroidal anti-inflammatories (NSAID): Secondary | ICD-10-CM | POA: Diagnosis not present

## 2020-02-14 DIAGNOSIS — Z79891 Long term (current) use of opiate analgesic: Secondary | ICD-10-CM | POA: Diagnosis not present

## 2020-02-14 NOTE — Telephone Encounter (Signed)
(  10:35am)Telephone call to patient to schedule palliative care visit with patient. Patient/family in agreement with home visit on 02/15/20 at 10:00am.

## 2020-02-14 NOTE — Progress Notes (Signed)
[facility] Consult Note Telephone: [phone]  Fax: [phone]  PATIENT NAME: Katherine Owen DOB: [birth year] MRN: [id number]  PRIMARY CARE PROVIDER:   Ann Held, DO  REFERRING PROVIDER:  Ann Held, DO [address] [place],  [place] [address]  RESPONSIBLE PARTYGerald Dexter [phone]  :        RECOMMENDATIONS and PLAN:  Palliative care encounter  Z51.5  1.  Advanced care planning: Reviewed most and DNR forms.  Delegation is selected for the DNAR.  Son would like to discuss MOST form selections with patient and notify social worker of selections on 8/24.  Primary goals are to avoid rehospitalization.  Palliative care has been asked to address goals of care.  2.  Decreased appetite: Encourage 3 nutritious meals and increase intake of boost with high-protein to 2x/day.  Consider initiation of appetite stimulant in the future if no improvement in appetite.  3.  Rash in the breast folds: Cleanse well and dry thoroughly.  Apply my statin powder beneath breast as instructed.  Prescription for nystatin powder E prescribed to [facility] in [place].  Inspect all folds for any signs of irritation.  4.  Vaginal bleeding: Discuss with PCP.  Son would like to have same evaluated and treated on an inpatient basis due to difficulty in transporting patient to [place] office and recurrent vaginal discharge and urinary tract infections.  Patient is stable today but will readdress.  Continue to monitor and notify provider of degree of bleeding/discharge  5.  Perineal wound: Minute and without signs of infection.  Keep clean and dry.  Encouraged caregivers to turn and reposition patient every 2-3 hours.  Will instruct home health nurse to monitor status of wound.  I spent 90 minutes providing this consultation,  from 1530 to 1700. More than 50% of the time in this consultation was spent  coordinating communication with patient son, DIL and caregiver.   HISTORY OF PRESENT ILLNESS:  Katherine Owen is a 86 y.o. year old female with multiple medical problems including functional paraplegia, hx of PNA, recurrent UTIs.  She requires assistance with all ADLs.  Caregiver reports bloody discharge today from perineal area.  Unsure of vaginal or rectal source. Pt. Reports vaginal pain.  Foley cathetar in place chronically.  Pt is incontinent of B&B.  Palliative Care was asked to help address goals of care.   CODE STATUS:  DNAR  PPS: 30% HOSPICE ELIGIBILITY/DIAGNOSIS: TBD D/C from hospice 6-8 months ago  PAST MEDICAL HISTORY:  Past Medical History:  Diagnosis Date  . Allergy   . Arthritis   . Cataract    Bilateral  . Essential hypertension 05/02/2014  . GERD (gastroesophageal reflux disease)   . Headache   . History of shingles   . HLD (hyperlipidemia) 05/02/2014  . Hyperlipidemia   . Hypertension   . Neuropathic pain 05/02/2014  . Osteoarthritis of both shoulders due to rotator cuff injury 04/23/2016  . Poor mobility 08/28/2015  . Pressure ulcer   . 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 Spinal stenosis of lumbar region 05/02/2014   S/p decompression at [facility] November 2015     SOCIAL HX:  Social History   Tobacco Use  . Smoking status: Former Smoker    Packs/day: 1.00    Years: 20.00    Pack years: 20.00    Types: Cigarettes    Quit date: 04/16/1967  Years since quitting: 52.8  . Smokeless tobacco: Never Used  . Tobacco comment: quit somking in early  1970's  Substance Use Topics  . Alcohol use: No    Alcohol/week: 0.0 standard drinks    ALLERGIES: No Known Allergies   PERTINENT MEDICATIONS:  Outpatient Encounter Medications as of 02/14/2020  Medication Sig  . acetaminophen (TYLENOL) 325 MG tablet Take 650 mg by mouth every 4 (four) hours as needed.  . celecoxib (CELEBREX) 200 MG capsule Take 1 capsule (200 mg total) by mouth daily.  .  Cholecalciferol (VITAMIN D3) 5000 units CAPS Take 5,000 Units by mouth at bedtime.   . fentaNYL (DURAGESIC) 12 MCG/HR Apply 1 patch Topical every three days  Along with Fentanyl 25 mcg to equal 37 mcg/ Hour  . fentaNYL (DURAGESIC) 25 MCG/HR Place 1 patch onto the skin every 3 (three) days.  . furosemide (LASIX) 20 MG tablet Take 20 mg by mouth.  . gabapentin (NEURONTIN) 100 MG capsule Take 1 capsule (100 mg total) by mouth 3 (three) times daily.  Marland Kitchen guaifenesin (ROBITUSSIN) 100 MG/5ML syrup Take 200 mg by mouth every 4 (four) hours as needed for cough.  . lactulose (CEPHULAC) 20 g packet Take 1 packet (20 g total) by mouth 2 (two) times daily.  Marland Kitchen losartan (COZAAR) 100 MG tablet Take 1 tablet (100 mg total) by mouth daily.  . Misc. Devices [facility]) MISC Use as directed  . montelukast (SINGULAIR) 10 MG tablet Take 1 tablet (10 mg total) by mouth at bedtime.  . Multiple Vitamins-Minerals (CERTAVITE/ANTIOXIDANTS PO) Take 1 tablet by mouth daily.  . NONFORMULARY OR COMPOUNDED ITEM Lift recliner #1  As directed  Dx spinal stenosis, osteoarthritis both shoulder ,  Low ext weakness  . pantoprazole (PROTONIX) 40 MG tablet Take 1 tablet (40 mg total) by mouth 2 (two) times daily.  . polyethylene glycol powder (MIRALAX) 17 GM/SCOOP powder Take 17 g by mouth daily as needed.  Marland Kitchen SANTYL ointment Apply 1 application topically daily.  Marland Kitchen senna-docusate (SENOKOT-S) 8.6-50 MG per tablet Take 1 tablet by mouth at bedtime.   . simethicone (MYLICON) 035 MG chewable tablet Chew 125 mg by mouth 3 (three) times daily after meals.  . traMADol (ULTRAM) 50 MG tablet Take 50 mg by mouth every 6 (six) hours as needed.    No facility-administered encounter medications on file as of 02/14/2020.    PHYSICAL EXAM:   General: NAD, well nourished appearing,  Cardiovascular: regular rate and rhythm Pulmonary: clear ant fields Abdomen: soft, nontender, + bowel sounds GU: no suprapubic tenderness  Moderate amount of pale  burgandy thick discharge from vaginal region Extremities: no edema, no joint deformities Skin: no rashes Neurological: Weakness parlplegia Gonzella Lex, NP

## 2020-02-15 ENCOUNTER — Other Ambulatory Visit: Payer: Self-pay

## 2020-02-15 ENCOUNTER — Other Ambulatory Visit: Payer: Medicare Other

## 2020-02-15 ENCOUNTER — Telehealth: Payer: Self-pay

## 2020-02-15 DIAGNOSIS — G8929 Other chronic pain: Secondary | ICD-10-CM | POA: Diagnosis not present

## 2020-02-15 DIAGNOSIS — M069 Rheumatoid arthritis, unspecified: Secondary | ICD-10-CM | POA: Diagnosis not present

## 2020-02-15 DIAGNOSIS — M961 Postlaminectomy syndrome, not elsewhere classified: Secondary | ICD-10-CM | POA: Diagnosis not present

## 2020-02-15 DIAGNOSIS — Z515 Encounter for palliative care: Secondary | ICD-10-CM

## 2020-02-15 DIAGNOSIS — M5412 Radiculopathy, cervical region: Secondary | ICD-10-CM | POA: Diagnosis not present

## 2020-02-15 DIAGNOSIS — M25512 Pain in left shoulder: Secondary | ICD-10-CM | POA: Diagnosis not present

## 2020-02-15 DIAGNOSIS — M21969 Unspecified acquired deformity of unspecified lower leg: Secondary | ICD-10-CM | POA: Diagnosis not present

## 2020-02-15 DIAGNOSIS — M25511 Pain in right shoulder: Secondary | ICD-10-CM | POA: Diagnosis not present

## 2020-02-15 NOTE — Telephone Encounter (Signed)
Received message to call patient's son regarding vaginal discharge. Spoke with son who reported that patient is having a beige colored thick vaginal discharge with some bleeding. Inquired if patient is scratching or c/o itching. Son stated that patient appears uncomfortable but is not able to move to scratch. Son stated that patient has had been dx with yeast infections in the past and has been on antibiotics recently.  Plan is to reach out to Palliative NP for further direction.

## 2020-02-16 DIAGNOSIS — M48061 Spinal stenosis, lumbar region without neurogenic claudication: Secondary | ICD-10-CM | POA: Diagnosis not present

## 2020-02-16 DIAGNOSIS — M792 Neuralgia and neuritis, unspecified: Secondary | ICD-10-CM | POA: Diagnosis not present

## 2020-02-16 DIAGNOSIS — I10 Essential (primary) hypertension: Secondary | ICD-10-CM | POA: Diagnosis not present

## 2020-02-16 DIAGNOSIS — G8220 Paraplegia, unspecified: Secondary | ICD-10-CM | POA: Diagnosis not present

## 2020-02-16 DIAGNOSIS — R1310 Dysphagia, unspecified: Secondary | ICD-10-CM | POA: Diagnosis not present

## 2020-02-16 DIAGNOSIS — N390 Urinary tract infection, site not specified: Secondary | ICD-10-CM | POA: Diagnosis not present

## 2020-02-16 DIAGNOSIS — I739 Peripheral vascular disease, unspecified: Secondary | ICD-10-CM | POA: Diagnosis not present

## 2020-02-17 NOTE — Progress Notes (Signed)
COMMUNITY PALLIATIVE CARE SW NOTE  PATIENT NAME: Katherine Owen DOB: 29-Sep-1927 MRN: [id number]  PRIMARY CARE PROVIDER: Ann Held, DO  RESPONSIBLE PARTY:  Acct ID - Guarantor Home Phone Work Phone Relationship Acct Type  [id number] - Darley,MIL* [phone]  Self P/F     [address], [place], [place] [address]     PLAN OF CARE and INTERVENTIONS:             1. GOALS OF CARE/ ADVANCE CARE PLANNING:  Goal is to maintain patient in the home. Patient is a DNR, form in the home. SOCIAL/EMOTIONAL/SPIRITUAL ASSESSMENT/ INTERVENTIONS:SW met with patient at her home, where she was present with her son-Allen. SW reinforced education provided by the palliative care nurse practitioner regarding palliative care program and SW role in patient's care. SW obtained medical and social history on patient. [name] explained that he has a live-in companion for patient. When that companion is not available, Allen stays with patient. He request assistance with obtaining additional services or assistance for patient's care. [name] advised that he has applied for VA assistance for patient 3x's and she has been denied each time. Allen declined assistance with meals on wheels. Patient was in bed, awake and alert. SW introduced herself to patient and she was receptive. She shared background information on herself in the form of  life review through the pictures in her room. She and her son provided a social history. Patient was born in [place], [place]. She completed highschool. She has been widowed since 2010. She has three children. Patient is of [employer]. Patient lived in [place] for over 40 years. SW provided supportive presence, education, active listening, reassurance of support, assessed patient's needs and comfort and coping of PCG. SW will provide ongoing assessment of psychosocial needs.  2.    3. PATIENT/CAREGIVER EDUCATION/ COPING: Patient is alert and oriented to self and situation. She seems to be coping  well. Her son is coping adequately, but desires more assistance with finances for patient. 4.  5. PERSONAL EMERGENCY PLAN:  911 can be activated for emergencies. 6. COMMUNITY RESOURCES COORDINATION/ HEALTH CARE NAVIGATION:  Patient receives nursing support through [facility]. 7. FINANCIAL/LEGAL CONCERNS/INTERVENTIONS:  PCG is requesting assistance with patient's finances.      SOCIAL HX:  Social History   Tobacco Use  . Smoking status: Former Smoker    Packs/day: 1.00    Years: 20.00    Pack years: 20.00    Types: Cigarettes    Quit date: 04/16/1967    Years since quitting: 52.8  . Smokeless tobacco: Never Used  . Tobacco comment: quit somking in early  1970's  Substance Use Topics  . Alcohol use: No    Alcohol/week: 0.0 standard drinks    CODE STATUS: DNR ADVANCED DIRECTIVES: Np MOST FORM COMPLETE:  No HOSPICE EDUCATION PROVIDED: No  PPS: Patient is alert and oriented to self and situation. She is dependent for all personal care needs.   Duration of visit and documentation: 60 minutes.      [address] [place], [place]

## 2020-02-18 ENCOUNTER — Telehealth: Payer: Self-pay

## 2020-02-18 DIAGNOSIS — K8020 Calculus of gallbladder without cholecystitis without obstruction: Secondary | ICD-10-CM | POA: Diagnosis not present

## 2020-02-18 DIAGNOSIS — N390 Urinary tract infection, site not specified: Secondary | ICD-10-CM | POA: Diagnosis not present

## 2020-02-18 DIAGNOSIS — Z7401 Bed confinement status: Secondary | ICD-10-CM | POA: Diagnosis not present

## 2020-02-18 DIAGNOSIS — R58 Hemorrhage, not elsewhere classified: Secondary | ICD-10-CM | POA: Diagnosis not present

## 2020-02-18 DIAGNOSIS — M2550 Pain in unspecified joint: Secondary | ICD-10-CM | POA: Diagnosis not present

## 2020-02-18 DIAGNOSIS — R195 Other fecal abnormalities: Secondary | ICD-10-CM | POA: Diagnosis not present

## 2020-02-18 DIAGNOSIS — I2510 Atherosclerotic heart disease of native coronary artery without angina pectoris: Secondary | ICD-10-CM | POA: Diagnosis not present

## 2020-02-18 DIAGNOSIS — R0902 Hypoxemia: Secondary | ICD-10-CM | POA: Diagnosis not present

## 2020-02-18 DIAGNOSIS — R531 Weakness: Secondary | ICD-10-CM | POA: Diagnosis not present

## 2020-02-18 DIAGNOSIS — I700 Atherosclerosis of aorta: Secondary | ICD-10-CM | POA: Diagnosis not present

## 2020-02-18 DIAGNOSIS — N200 Calculus of kidney: Secondary | ICD-10-CM | POA: Diagnosis not present

## 2020-02-18 DIAGNOSIS — R300 Dysuria: Secondary | ICD-10-CM | POA: Diagnosis not present

## 2020-02-18 DIAGNOSIS — Z978 Presence of other specified devices: Secondary | ICD-10-CM | POA: Diagnosis not present

## 2020-02-18 DIAGNOSIS — I10 Essential (primary) hypertension: Secondary | ICD-10-CM | POA: Diagnosis not present

## 2020-02-18 DIAGNOSIS — D259 Leiomyoma of uterus, unspecified: Secondary | ICD-10-CM | POA: Diagnosis not present

## 2020-02-18 DIAGNOSIS — R102 Pelvic and perineal pain: Secondary | ICD-10-CM | POA: Diagnosis not present

## 2020-02-18 NOTE — Telephone Encounter (Signed)
(  4:20p) SW called Dr. Altamese Dilling office to request an FL 2 for patient per family request. SW spoke to [employer], Psychologist, sport and exercise advised that she will put the request in for the [facility] 2.

## 2020-02-23 DIAGNOSIS — M79674 Pain in right toe(s): Secondary | ICD-10-CM | POA: Diagnosis not present

## 2020-02-23 DIAGNOSIS — B353 Tinea pedis: Secondary | ICD-10-CM | POA: Diagnosis not present

## 2020-02-23 DIAGNOSIS — M2042 Other hammer toe(s) (acquired), left foot: Secondary | ICD-10-CM | POA: Diagnosis not present

## 2020-02-23 DIAGNOSIS — B351 Tinea unguium: Secondary | ICD-10-CM | POA: Diagnosis not present

## 2020-03-08 DIAGNOSIS — I10 Essential (primary) hypertension: Secondary | ICD-10-CM | POA: Diagnosis not present

## 2020-03-08 DIAGNOSIS — M792 Neuralgia and neuritis, unspecified: Secondary | ICD-10-CM | POA: Diagnosis not present

## 2020-03-08 DIAGNOSIS — R1310 Dysphagia, unspecified: Secondary | ICD-10-CM | POA: Diagnosis not present

## 2020-03-08 DIAGNOSIS — M48061 Spinal stenosis, lumbar region without neurogenic claudication: Secondary | ICD-10-CM | POA: Diagnosis not present

## 2020-03-08 DIAGNOSIS — G8220 Paraplegia, unspecified: Secondary | ICD-10-CM | POA: Diagnosis not present

## 2020-03-08 DIAGNOSIS — I739 Peripheral vascular disease, unspecified: Secondary | ICD-10-CM | POA: Diagnosis not present

## 2020-03-09 DIAGNOSIS — Z7901 Long term (current) use of anticoagulants: Secondary | ICD-10-CM | POA: Diagnosis not present

## 2020-03-09 DIAGNOSIS — N760 Acute vaginitis: Secondary | ICD-10-CM | POA: Diagnosis not present

## 2020-03-09 DIAGNOSIS — G894 Chronic pain syndrome: Secondary | ICD-10-CM | POA: Diagnosis not present

## 2020-03-09 DIAGNOSIS — I10 Essential (primary) hypertension: Secondary | ICD-10-CM | POA: Diagnosis not present

## 2020-03-13 DIAGNOSIS — I739 Peripheral vascular disease, unspecified: Secondary | ICD-10-CM | POA: Diagnosis not present

## 2020-03-13 DIAGNOSIS — I10 Essential (primary) hypertension: Secondary | ICD-10-CM | POA: Diagnosis not present

## 2020-03-13 DIAGNOSIS — M792 Neuralgia and neuritis, unspecified: Secondary | ICD-10-CM | POA: Diagnosis not present

## 2020-03-13 DIAGNOSIS — R1310 Dysphagia, unspecified: Secondary | ICD-10-CM | POA: Diagnosis not present

## 2020-03-13 DIAGNOSIS — G8220 Paraplegia, unspecified: Secondary | ICD-10-CM | POA: Diagnosis not present

## 2020-03-13 DIAGNOSIS — M48061 Spinal stenosis, lumbar region without neurogenic claudication: Secondary | ICD-10-CM | POA: Diagnosis not present

## 2020-03-15 ENCOUNTER — Other Ambulatory Visit: Payer: Medicare Other | Admitting: *Deleted

## 2020-03-15 ENCOUNTER — Other Ambulatory Visit: Payer: Self-pay

## 2020-03-15 DIAGNOSIS — R1310 Dysphagia, unspecified: Secondary | ICD-10-CM | POA: Diagnosis not present

## 2020-03-15 DIAGNOSIS — Z7901 Long term (current) use of anticoagulants: Secondary | ICD-10-CM | POA: Diagnosis not present

## 2020-03-15 DIAGNOSIS — I10 Essential (primary) hypertension: Secondary | ICD-10-CM | POA: Diagnosis not present

## 2020-03-15 DIAGNOSIS — I739 Peripheral vascular disease, unspecified: Secondary | ICD-10-CM | POA: Diagnosis not present

## 2020-03-15 DIAGNOSIS — H9190 Unspecified hearing loss, unspecified ear: Secondary | ICD-10-CM | POA: Diagnosis not present

## 2020-03-15 DIAGNOSIS — M8949 Other hypertrophic osteoarthropathy, multiple sites: Secondary | ICD-10-CM | POA: Diagnosis not present

## 2020-03-15 DIAGNOSIS — Z87891 Personal history of nicotine dependence: Secondary | ICD-10-CM | POA: Diagnosis not present

## 2020-03-15 DIAGNOSIS — Z466 Encounter for fitting and adjustment of urinary device: Secondary | ICD-10-CM | POA: Diagnosis not present

## 2020-03-15 DIAGNOSIS — K219 Gastro-esophageal reflux disease without esophagitis: Secondary | ICD-10-CM | POA: Diagnosis not present

## 2020-03-15 DIAGNOSIS — Z515 Encounter for palliative care: Secondary | ICD-10-CM

## 2020-03-15 DIAGNOSIS — M792 Neuralgia and neuritis, unspecified: Secondary | ICD-10-CM | POA: Diagnosis not present

## 2020-03-15 DIAGNOSIS — H269 Unspecified cataract: Secondary | ICD-10-CM | POA: Diagnosis not present

## 2020-03-15 DIAGNOSIS — Z9181 History of falling: Secondary | ICD-10-CM | POA: Diagnosis not present

## 2020-03-15 DIAGNOSIS — Z993 Dependence on wheelchair: Secondary | ICD-10-CM | POA: Diagnosis not present

## 2020-03-15 DIAGNOSIS — E785 Hyperlipidemia, unspecified: Secondary | ICD-10-CM | POA: Diagnosis not present

## 2020-03-15 DIAGNOSIS — Z79891 Long term (current) use of opiate analgesic: Secondary | ICD-10-CM | POA: Diagnosis not present

## 2020-03-15 DIAGNOSIS — Z791 Long term (current) use of non-steroidal anti-inflammatories (NSAID): Secondary | ICD-10-CM | POA: Diagnosis not present

## 2020-03-15 DIAGNOSIS — G894 Chronic pain syndrome: Secondary | ICD-10-CM | POA: Diagnosis not present

## 2020-03-15 DIAGNOSIS — Z86718 Personal history of other venous thrombosis and embolism: Secondary | ICD-10-CM | POA: Diagnosis not present

## 2020-03-15 DIAGNOSIS — Z9981 Dependence on supplemental oxygen: Secondary | ICD-10-CM | POA: Diagnosis not present

## 2020-03-15 DIAGNOSIS — M48061 Spinal stenosis, lumbar region without neurogenic claudication: Secondary | ICD-10-CM | POA: Diagnosis not present

## 2020-03-15 DIAGNOSIS — K5901 Slow transit constipation: Secondary | ICD-10-CM | POA: Diagnosis not present

## 2020-03-15 DIAGNOSIS — G8220 Paraplegia, unspecified: Secondary | ICD-10-CM | POA: Diagnosis not present

## 2020-03-20 DIAGNOSIS — I739 Peripheral vascular disease, unspecified: Secondary | ICD-10-CM | POA: Diagnosis not present

## 2020-03-20 DIAGNOSIS — M48061 Spinal stenosis, lumbar region without neurogenic claudication: Secondary | ICD-10-CM | POA: Diagnosis not present

## 2020-03-20 DIAGNOSIS — G8220 Paraplegia, unspecified: Secondary | ICD-10-CM | POA: Diagnosis not present

## 2020-03-20 DIAGNOSIS — R1310 Dysphagia, unspecified: Secondary | ICD-10-CM | POA: Diagnosis not present

## 2020-03-20 DIAGNOSIS — I10 Essential (primary) hypertension: Secondary | ICD-10-CM | POA: Diagnosis not present

## 2020-03-20 DIAGNOSIS — M792 Neuralgia and neuritis, unspecified: Secondary | ICD-10-CM | POA: Diagnosis not present

## 2020-03-24 NOTE — Progress Notes (Signed)
COMMUNITY PALLIATIVE CARE RN NOTE  PATIENT NAME: Katherine Owen DOB: 08-Jul-1927 MRN: [id number]  PRIMARY CARE PROVIDER: Carollee Herter, [name], DO  RESPONSIBLE PARTY: Cloria Spring (son) Acct ID - Guarantor Home Phone Work Phone Relationship Acct Type  [id number] - Weichel,MIL* [phone]  Self P/F     [address], [place], [place] [address]   Due to the COVID-19 crisis, this virtual check-in visit was done via telephone from my office and it was initiated and consent by this patient and or family.  PLAN OF CARE and INTERVENTION:  1. ADVANCE CARE PLANNING/GOALS OF CARE: Goal is to avoid hospitalizations. She has a DNR. 2. PATIENT/CAREGIVER EDUCATION: Symptom management 3. DISEASE STATUS: Virtual check-in visit completed via telephone. Patient is not experiencing any pain at this time. [name] says that her latest issue is noticing a small amount of blood in patient's Depends. She did have an issue with the catheter clogging and backing up due to having a "crimp" in it. He says that this is now ok and foley is working fine. [facility] nurse came out 2 days ago and obtained a specimen for a urinalysis and culture. The test came back negative. The catheter is due to be changed next week. On 8/27, patient was seen in the ED and was positive for a UTI which was treated with Cipro x 10 days. When our social worker visited with patient last month, a FL2 was requested and her PCP has completed it. She remains at home at this time and nursing home placement has not been pursued as of yet. Patient is dependent for all ADLs. Will continue to monitor.   HISTORY OF PRESENT ILLNESS: This is a 86 yo female with a h/o paraplegia, PNA and recurrent UTIs. Palliative care team continues to follow patient and visits monthly and PRN.  CODE STATUS: DNR ADVANCED DIRECTIVES: N MOST FORM: no PPS: 30%   (Duration of visit and documentation 30 minutes)   Daryl Eastern, RN BSN

## 2020-04-06 DIAGNOSIS — I70212 Atherosclerosis of native arteries of extremities with intermittent claudication, left leg: Secondary | ICD-10-CM | POA: Diagnosis not present

## 2020-04-06 DIAGNOSIS — M79605 Pain in left leg: Secondary | ICD-10-CM | POA: Diagnosis not present

## 2020-04-06 DIAGNOSIS — M1711 Unilateral primary osteoarthritis, right knee: Secondary | ICD-10-CM | POA: Diagnosis not present

## 2020-04-06 DIAGNOSIS — L89893 Pressure ulcer of other site, stage 3: Secondary | ICD-10-CM | POA: Diagnosis not present

## 2020-04-06 DIAGNOSIS — M7989 Other specified soft tissue disorders: Secondary | ICD-10-CM | POA: Diagnosis not present

## 2020-04-06 DIAGNOSIS — R2242 Localized swelling, mass and lump, left lower limb: Secondary | ICD-10-CM | POA: Diagnosis not present

## 2020-04-06 DIAGNOSIS — G8220 Paraplegia, unspecified: Secondary | ICD-10-CM | POA: Diagnosis not present

## 2020-04-06 DIAGNOSIS — I10 Essential (primary) hypertension: Secondary | ICD-10-CM | POA: Diagnosis not present

## 2020-04-06 DIAGNOSIS — M6258 Muscle wasting and atrophy, not elsewhere classified, other site: Secondary | ICD-10-CM | POA: Diagnosis not present

## 2020-04-06 DIAGNOSIS — E875 Hyperkalemia: Secondary | ICD-10-CM | POA: Diagnosis not present

## 2020-04-06 DIAGNOSIS — D1724 Benign lipomatous neoplasm of skin and subcutaneous tissue of left leg: Secondary | ICD-10-CM | POA: Diagnosis not present

## 2020-04-06 DIAGNOSIS — M1712 Unilateral primary osteoarthritis, left knee: Secondary | ICD-10-CM | POA: Diagnosis not present

## 2020-04-06 DIAGNOSIS — Z66 Do not resuscitate: Secondary | ICD-10-CM | POA: Diagnosis not present

## 2020-04-06 DIAGNOSIS — N179 Acute kidney failure, unspecified: Secondary | ICD-10-CM | POA: Diagnosis not present

## 2020-04-06 DIAGNOSIS — M79652 Pain in left thigh: Secondary | ICD-10-CM | POA: Diagnosis not present

## 2020-04-06 DIAGNOSIS — C4922 Malignant neoplasm of connective and soft tissue of left lower limb, including hip: Secondary | ICD-10-CM | POA: Diagnosis not present

## 2020-04-06 DIAGNOSIS — M79672 Pain in left foot: Secondary | ICD-10-CM | POA: Diagnosis not present

## 2020-04-06 DIAGNOSIS — D179 Benign lipomatous neoplasm, unspecified: Secondary | ICD-10-CM | POA: Diagnosis not present

## 2020-04-06 DIAGNOSIS — R52 Pain, unspecified: Secondary | ICD-10-CM | POA: Diagnosis not present

## 2020-04-07 DIAGNOSIS — E875 Hyperkalemia: Secondary | ICD-10-CM | POA: Diagnosis not present

## 2020-04-07 DIAGNOSIS — N179 Acute kidney failure, unspecified: Secondary | ICD-10-CM | POA: Diagnosis not present

## 2020-04-07 DIAGNOSIS — G8220 Paraplegia, unspecified: Secondary | ICD-10-CM | POA: Diagnosis not present

## 2020-04-07 DIAGNOSIS — D1724 Benign lipomatous neoplasm of skin and subcutaneous tissue of left leg: Secondary | ICD-10-CM | POA: Diagnosis not present

## 2020-04-07 DIAGNOSIS — I10 Essential (primary) hypertension: Secondary | ICD-10-CM | POA: Diagnosis not present

## 2020-04-07 DIAGNOSIS — R2242 Localized swelling, mass and lump, left lower limb: Secondary | ICD-10-CM | POA: Diagnosis not present

## 2020-04-07 DIAGNOSIS — R1310 Dysphagia, unspecified: Secondary | ICD-10-CM | POA: Diagnosis not present

## 2020-04-08 DIAGNOSIS — Z8673 Personal history of transient ischemic attack (TIA), and cerebral infarction without residual deficits: Secondary | ICD-10-CM | POA: Diagnosis not present

## 2020-04-08 DIAGNOSIS — M6258 Muscle wasting and atrophy, not elsewhere classified, other site: Secondary | ICD-10-CM | POA: Diagnosis not present

## 2020-04-08 DIAGNOSIS — Z7401 Bed confinement status: Secondary | ICD-10-CM | POA: Diagnosis not present

## 2020-04-08 DIAGNOSIS — Z791 Long term (current) use of non-steroidal anti-inflammatories (NSAID): Secondary | ICD-10-CM | POA: Diagnosis not present

## 2020-04-08 DIAGNOSIS — R1310 Dysphagia, unspecified: Secondary | ICD-10-CM | POA: Diagnosis not present

## 2020-04-08 DIAGNOSIS — Z66 Do not resuscitate: Secondary | ICD-10-CM | POA: Diagnosis present

## 2020-04-08 DIAGNOSIS — M1712 Unilateral primary osteoarthritis, left knee: Secondary | ICD-10-CM | POA: Diagnosis not present

## 2020-04-08 DIAGNOSIS — E663 Overweight: Secondary | ICD-10-CM | POA: Diagnosis present

## 2020-04-08 DIAGNOSIS — N179 Acute kidney failure, unspecified: Secondary | ICD-10-CM | POA: Diagnosis not present

## 2020-04-08 DIAGNOSIS — I10 Essential (primary) hypertension: Secondary | ICD-10-CM | POA: Diagnosis not present

## 2020-04-08 DIAGNOSIS — H6123 Impacted cerumen, bilateral: Secondary | ICD-10-CM | POA: Diagnosis not present

## 2020-04-08 DIAGNOSIS — G8220 Paraplegia, unspecified: Secondary | ICD-10-CM | POA: Diagnosis present

## 2020-04-08 DIAGNOSIS — R410 Disorientation, unspecified: Secondary | ICD-10-CM | POA: Diagnosis not present

## 2020-04-08 DIAGNOSIS — Z86718 Personal history of other venous thrombosis and embolism: Secondary | ICD-10-CM | POA: Diagnosis not present

## 2020-04-08 DIAGNOSIS — Z6825 Body mass index (BMI) 25.0-25.9, adult: Secondary | ICD-10-CM | POA: Diagnosis not present

## 2020-04-08 DIAGNOSIS — Z7901 Long term (current) use of anticoagulants: Secondary | ICD-10-CM | POA: Diagnosis not present

## 2020-04-08 DIAGNOSIS — Z79891 Long term (current) use of opiate analgesic: Secondary | ICD-10-CM | POA: Diagnosis not present

## 2020-04-08 DIAGNOSIS — E875 Hyperkalemia: Secondary | ICD-10-CM | POA: Diagnosis present

## 2020-04-08 DIAGNOSIS — G629 Polyneuropathy, unspecified: Secondary | ICD-10-CM | POA: Diagnosis present

## 2020-04-08 DIAGNOSIS — Z79899 Other long term (current) drug therapy: Secondary | ICD-10-CM | POA: Diagnosis not present

## 2020-04-08 DIAGNOSIS — C4922 Malignant neoplasm of connective and soft tissue of left lower limb, including hip: Secondary | ICD-10-CM | POA: Diagnosis present

## 2020-04-08 DIAGNOSIS — Z23 Encounter for immunization: Secondary | ICD-10-CM | POA: Diagnosis not present

## 2020-04-08 DIAGNOSIS — D1724 Benign lipomatous neoplasm of skin and subcutaneous tissue of left leg: Secondary | ICD-10-CM | POA: Diagnosis not present

## 2020-04-08 DIAGNOSIS — Z96642 Presence of left artificial hip joint: Secondary | ICD-10-CM | POA: Diagnosis not present

## 2020-04-08 DIAGNOSIS — R2242 Localized swelling, mass and lump, left lower limb: Secondary | ICD-10-CM | POA: Diagnosis not present

## 2020-04-08 DIAGNOSIS — L89893 Pressure ulcer of other site, stage 3: Secondary | ICD-10-CM | POA: Diagnosis present

## 2020-04-08 DIAGNOSIS — R000 Tachycardia, unspecified: Secondary | ICD-10-CM | POA: Diagnosis not present

## 2020-04-08 DIAGNOSIS — R0902 Hypoxemia: Secondary | ICD-10-CM | POA: Diagnosis not present

## 2020-04-08 DIAGNOSIS — M2550 Pain in unspecified joint: Secondary | ICD-10-CM | POA: Diagnosis not present

## 2020-04-11 DIAGNOSIS — I739 Peripheral vascular disease, unspecified: Secondary | ICD-10-CM | POA: Diagnosis not present

## 2020-04-11 DIAGNOSIS — I10 Essential (primary) hypertension: Secondary | ICD-10-CM | POA: Diagnosis not present

## 2020-04-11 DIAGNOSIS — R1310 Dysphagia, unspecified: Secondary | ICD-10-CM | POA: Diagnosis not present

## 2020-04-11 DIAGNOSIS — G8220 Paraplegia, unspecified: Secondary | ICD-10-CM | POA: Diagnosis not present

## 2020-04-11 DIAGNOSIS — M792 Neuralgia and neuritis, unspecified: Secondary | ICD-10-CM | POA: Diagnosis not present

## 2020-04-11 DIAGNOSIS — M48061 Spinal stenosis, lumbar region without neurogenic claudication: Secondary | ICD-10-CM | POA: Diagnosis not present

## 2020-04-15 DIAGNOSIS — I10 Essential (primary) hypertension: Secondary | ICD-10-CM | POA: Diagnosis not present

## 2020-04-15 DIAGNOSIS — D1724 Benign lipomatous neoplasm of skin and subcutaneous tissue of left leg: Secondary | ICD-10-CM | POA: Diagnosis not present

## 2020-04-15 DIAGNOSIS — Z960 Presence of urogenital implants: Secondary | ICD-10-CM | POA: Diagnosis not present

## 2020-04-15 DIAGNOSIS — G894 Chronic pain syndrome: Secondary | ICD-10-CM | POA: Diagnosis not present

## 2020-04-15 DIAGNOSIS — K5909 Other constipation: Secondary | ICD-10-CM | POA: Diagnosis not present

## 2020-04-17 ENCOUNTER — Telehealth: Payer: Self-pay

## 2020-04-17 NOTE — Telephone Encounter (Signed)
Telephone call to patient to schedule palliative care visit with patient. Patient/family in agreement with home visit on 04/19/20@12 :30pm.

## 2020-04-19 ENCOUNTER — Other Ambulatory Visit: Payer: Self-pay

## 2020-04-19 ENCOUNTER — Other Ambulatory Visit: Payer: Medicare Other

## 2020-04-19 DIAGNOSIS — Z515 Encounter for palliative care: Secondary | ICD-10-CM

## 2020-04-20 NOTE — Progress Notes (Signed)
COMMUNITY PALLIATIVE CARE SW NOTE  PATIENT NAME: Katherine Owen DOB: 01-Oct-1927 MRN: [id number]  PRIMARY CARE PROVIDER: Ann Held, DO  RESPONSIBLE PARTY:  Acct ID - Guarantor Home Phone Work Phone Relationship Acct Type  [id number] - Katherine Owen,MIL* [phone]  Self P/F     [address], [place], [place] [address]     PLAN OF CARE and INTERVENTIONS:             1. GOALS OF CARE/ ADVANCE CARE PLANNING: Goal is to maintain patient in the home. Patient is a DNR, form in the home.  2. SOCIAL/EMOTIONAL/SPIRITUAL ASSESSMENT/ INTERVENTIONS: SW completed a home visit with patient and her son-Katherine Owen. Katherine Owen advised that patient was hospitalized from October 14-19 for elevated potassium, fluid under the knee and lipoma. MRI was done and the results were inconclusive about the risk of cancer. However, if cancer was found patient is not a candidate for surgery. Patient returned home and has since had swallowing issues due to palatal tori that is being evaluated by the dentist. She reports pain in her neck that goes down to her legs, heels and buttocks. Patient report that she often feels she needs to have a bowel movement, but knows that she does not. She report that the pain is increased at night, rating it and 8 on the pain scale. Patient and PCG feel that her pain medication should be re-evaluated for possible change. Patient expressed concern about her not being able to eat because of the obstruction in her mouth. She stated "I'm dying". Patient seemed fearful and fretful about her inability to eat and drink. SW advised her that the palliative RN and NP will be consulted for follow-up.SW provided supportive presence, education, active listening, reassurance of support, assessed patient's needs, comfort and coping. SW will provide ongoing psychosocial assessment and support. *Palliative NP and RN updated for follow-up with patient.  3. PATIENT/CAREGIVER EDUCATION/ COPING:  Patient is alert and  oriented x3. Patient appears to be fearful of her current status. She is desiring more interventions. 4. PERSONAL EMERGENCY PLAN:  911 can be called for emergencies. 5. COMMUNITY RESOURCES COORDINATION/ HEALTH CARE NAVIGATION:  Patient receives nursing support from [facility]. Patient also has a live-in Actuary.  6. FINANCIAL/LEGAL CONCERNS/INTERVENTIONS:  Concerns with financial assistance escalates as patient care needs increase and decline is present.      SOCIAL HX:  Social History   Tobacco Use  . Smoking status: Former Smoker    Packs/day: 1.00    Years: 20.00    Pack years: 20.00    Types: Cigarettes    Quit date: 04/16/1967    Years since quitting: 53.0  . Smokeless tobacco: Never Used  . Tobacco comment: quit somking in early  1970's  Substance Use Topics  . Alcohol use: No    Alcohol/week: 0.0 standard drinks    CODE STATUS: DNR ADVANCED DIRECTIVES: No MOST FORM COMPLETE:  No HOSPICE EDUCATION PROVIDED: No  PPS: Patient is alert and oriented to self and situation. Patient is bedbound and dependent for all personal care needs.   Duration of visit and documentation: 60 minutes      Katherine Puller, LCSW

## 2020-04-26 ENCOUNTER — Other Ambulatory Visit: Payer: Self-pay | Admitting: Family Medicine

## 2020-04-26 DIAGNOSIS — I10 Essential (primary) hypertension: Secondary | ICD-10-CM

## 2020-04-29 DIAGNOSIS — J90 Pleural effusion, not elsewhere classified: Secondary | ICD-10-CM | POA: Diagnosis not present

## 2020-04-29 DIAGNOSIS — E785 Hyperlipidemia, unspecified: Secondary | ICD-10-CM | POA: Diagnosis present

## 2020-04-29 DIAGNOSIS — K625 Hemorrhage of anus and rectum: Secondary | ICD-10-CM | POA: Diagnosis not present

## 2020-04-29 DIAGNOSIS — G6289 Other specified polyneuropathies: Secondary | ICD-10-CM | POA: Diagnosis present

## 2020-04-29 DIAGNOSIS — R001 Bradycardia, unspecified: Secondary | ICD-10-CM | POA: Diagnosis not present

## 2020-04-29 DIAGNOSIS — R4182 Altered mental status, unspecified: Secondary | ICD-10-CM | POA: Diagnosis not present

## 2020-04-29 DIAGNOSIS — G629 Polyneuropathy, unspecified: Secondary | ICD-10-CM | POA: Diagnosis present

## 2020-04-29 DIAGNOSIS — I959 Hypotension, unspecified: Secondary | ICD-10-CM | POA: Diagnosis not present

## 2020-04-29 DIAGNOSIS — G9341 Metabolic encephalopathy: Secondary | ICD-10-CM | POA: Diagnosis present

## 2020-04-29 DIAGNOSIS — K219 Gastro-esophageal reflux disease without esophagitis: Secondary | ICD-10-CM | POA: Diagnosis present

## 2020-04-29 DIAGNOSIS — N390 Urinary tract infection, site not specified: Secondary | ICD-10-CM | POA: Diagnosis present

## 2020-04-29 DIAGNOSIS — G319 Degenerative disease of nervous system, unspecified: Secondary | ICD-10-CM | POA: Diagnosis not present

## 2020-04-29 DIAGNOSIS — Z7901 Long term (current) use of anticoagulants: Secondary | ICD-10-CM | POA: Diagnosis not present

## 2020-04-29 DIAGNOSIS — Z960 Presence of urogenital implants: Secondary | ICD-10-CM | POA: Diagnosis present

## 2020-04-29 DIAGNOSIS — G9009 Other idiopathic peripheral autonomic neuropathy: Secondary | ICD-10-CM | POA: Diagnosis not present

## 2020-04-29 DIAGNOSIS — G9389 Other specified disorders of brain: Secondary | ICD-10-CM | POA: Diagnosis not present

## 2020-04-29 DIAGNOSIS — Z79899 Other long term (current) drug therapy: Secondary | ICD-10-CM | POA: Diagnosis not present

## 2020-04-29 DIAGNOSIS — I10 Essential (primary) hypertension: Secondary | ICD-10-CM | POA: Diagnosis not present

## 2020-04-29 DIAGNOSIS — K922 Gastrointestinal hemorrhage, unspecified: Secondary | ICD-10-CM | POA: Diagnosis not present

## 2020-04-29 DIAGNOSIS — Z8719 Personal history of other diseases of the digestive system: Secondary | ICD-10-CM | POA: Diagnosis not present

## 2020-04-29 DIAGNOSIS — Z86718 Personal history of other venous thrombosis and embolism: Secondary | ICD-10-CM | POA: Diagnosis not present

## 2020-04-29 DIAGNOSIS — T83511A Infection and inflammatory reaction due to indwelling urethral catheter, initial encounter: Secondary | ICD-10-CM | POA: Diagnosis not present

## 2020-04-29 DIAGNOSIS — R21 Rash and other nonspecific skin eruption: Secondary | ICD-10-CM | POA: Diagnosis not present

## 2020-04-29 DIAGNOSIS — E7800 Pure hypercholesterolemia, unspecified: Secondary | ICD-10-CM | POA: Diagnosis present

## 2020-04-29 DIAGNOSIS — Z66 Do not resuscitate: Secondary | ICD-10-CM | POA: Diagnosis not present

## 2020-04-29 DIAGNOSIS — R532 Functional quadriplegia: Secondary | ICD-10-CM | POA: Diagnosis not present

## 2020-04-29 DIAGNOSIS — R000 Tachycardia, unspecified: Secondary | ICD-10-CM | POA: Diagnosis not present

## 2020-04-29 DIAGNOSIS — Z8673 Personal history of transient ischemic attack (TIA), and cerebral infarction without residual deficits: Secondary | ICD-10-CM | POA: Diagnosis not present

## 2020-04-29 DIAGNOSIS — G9340 Encephalopathy, unspecified: Secondary | ICD-10-CM | POA: Diagnosis not present

## 2020-04-29 DIAGNOSIS — J9601 Acute respiratory failure with hypoxia: Secondary | ICD-10-CM | POA: Diagnosis not present

## 2020-04-29 DIAGNOSIS — K921 Melena: Secondary | ICD-10-CM | POA: Diagnosis present

## 2020-05-24 DEATH — deceased

## 2020-10-16 NOTE — Progress Notes (Signed)
Error

## 2020-10-16 NOTE — Telephone Encounter (Signed)
Error, please disregard.
